# Patient Record
Sex: Male | Born: 1935 | Race: White | Hispanic: No | Marital: Married | Smoking: Former smoker
Health system: Southern US, Community
[De-identification: ages and names within clinical notes are randomized; demographics above are authoritative.]

## PROBLEM LIST (undated history)

## (undated) DIAGNOSIS — I2510 Atherosclerotic heart disease of native coronary artery without angina pectoris: Secondary | ICD-10-CM

## (undated) DIAGNOSIS — J449 Chronic obstructive pulmonary disease, unspecified: Secondary | ICD-10-CM

## (undated) DIAGNOSIS — I480 Paroxysmal atrial fibrillation: Secondary | ICD-10-CM

## (undated) DIAGNOSIS — K2210 Ulcer of esophagus without bleeding: Secondary | ICD-10-CM

## (undated) DIAGNOSIS — I10 Essential (primary) hypertension: Secondary | ICD-10-CM

## (undated) DIAGNOSIS — K5790 Diverticulosis of intestine, part unspecified, without perforation or abscess without bleeding: Secondary | ICD-10-CM

## (undated) DIAGNOSIS — K219 Gastro-esophageal reflux disease without esophagitis: Secondary | ICD-10-CM

## (undated) DIAGNOSIS — J45909 Unspecified asthma, uncomplicated: Secondary | ICD-10-CM

## (undated) DIAGNOSIS — K2270 Barrett's esophagus without dysplasia: Secondary | ICD-10-CM

## (undated) DIAGNOSIS — K648 Other hemorrhoids: Secondary | ICD-10-CM

## (undated) DIAGNOSIS — M1990 Unspecified osteoarthritis, unspecified site: Secondary | ICD-10-CM

## (undated) DIAGNOSIS — E785 Hyperlipidemia, unspecified: Secondary | ICD-10-CM

## (undated) DIAGNOSIS — S065X9A Traumatic subdural hemorrhage with loss of consciousness of unspecified duration, initial encounter: Secondary | ICD-10-CM

## (undated) DIAGNOSIS — I495 Sick sinus syndrome: Secondary | ICD-10-CM

## (undated) DIAGNOSIS — K449 Diaphragmatic hernia without obstruction or gangrene: Secondary | ICD-10-CM

## (undated) DIAGNOSIS — F0390 Unspecified dementia without behavioral disturbance: Secondary | ICD-10-CM

## (undated) HISTORY — DX: Atherosclerotic heart disease of native coronary artery without angina pectoris: I25.10

## (undated) HISTORY — DX: Diaphragmatic hernia without obstruction or gangrene: K44.9

## (undated) HISTORY — DX: Ulcer of esophagus without bleeding: K22.10

## (undated) HISTORY — DX: Paroxysmal atrial fibrillation: I48.0

## (undated) HISTORY — DX: Unspecified osteoarthritis, unspecified site: M19.90

## (undated) HISTORY — DX: Unspecified asthma, uncomplicated: J45.909

## (undated) HISTORY — PX: HEMORRHOID SURGERY: SHX153

## (undated) HISTORY — DX: Gastro-esophageal reflux disease without esophagitis: K21.9

## (undated) HISTORY — DX: Hyperlipidemia, unspecified: E78.5

## (undated) HISTORY — DX: Sick sinus syndrome: I49.5

## (undated) HISTORY — DX: Other hemorrhoids: K64.8

## (undated) HISTORY — DX: Diverticulosis of intestine, part unspecified, without perforation or abscess without bleeding: K57.90

## (undated) HISTORY — DX: Chronic obstructive pulmonary disease, unspecified: J44.9

## (undated) HISTORY — PX: CATARACT EXTRACTION: SUR2

## (undated) HISTORY — PX: VASECTOMY: SHX75

## (undated) HISTORY — PX: COLONOSCOPY: SHX174

## (undated) HISTORY — DX: Barrett's esophagus without dysplasia: K22.70

## (undated) HISTORY — PX: TONSILLECTOMY: SUR1361

## (undated) HISTORY — DX: Essential (primary) hypertension: I10

---

## 1999-11-26 ENCOUNTER — Encounter: Payer: Self-pay | Admitting: General Surgery

## 1999-11-26 ENCOUNTER — Inpatient Hospital Stay (HOSPITAL_COMMUNITY): Admission: EM | Admit: 1999-11-26 | Discharge: 1999-11-29 | Payer: Self-pay | Admitting: General Surgery

## 2000-03-26 ENCOUNTER — Encounter (INDEPENDENT_AMBULATORY_CARE_PROVIDER_SITE_OTHER): Payer: Self-pay | Admitting: Specialist

## 2000-03-26 ENCOUNTER — Ambulatory Visit (HOSPITAL_BASED_OUTPATIENT_CLINIC_OR_DEPARTMENT_OTHER): Admission: RE | Admit: 2000-03-26 | Discharge: 2000-03-26 | Payer: Self-pay | Admitting: General Surgery

## 2001-05-07 ENCOUNTER — Encounter (INDEPENDENT_AMBULATORY_CARE_PROVIDER_SITE_OTHER): Payer: Self-pay | Admitting: *Deleted

## 2001-05-07 ENCOUNTER — Encounter (INDEPENDENT_AMBULATORY_CARE_PROVIDER_SITE_OTHER): Payer: Self-pay | Admitting: Specialist

## 2001-05-07 ENCOUNTER — Ambulatory Visit (HOSPITAL_COMMUNITY): Admission: RE | Admit: 2001-05-07 | Discharge: 2001-05-07 | Payer: Self-pay | Admitting: *Deleted

## 2001-06-03 ENCOUNTER — Encounter (INDEPENDENT_AMBULATORY_CARE_PROVIDER_SITE_OTHER): Payer: Self-pay | Admitting: Specialist

## 2001-06-03 ENCOUNTER — Encounter (INDEPENDENT_AMBULATORY_CARE_PROVIDER_SITE_OTHER): Payer: Self-pay | Admitting: *Deleted

## 2001-06-03 ENCOUNTER — Ambulatory Visit (HOSPITAL_COMMUNITY): Admission: RE | Admit: 2001-06-03 | Discharge: 2001-06-03 | Payer: Self-pay | Admitting: General Surgery

## 2002-04-08 ENCOUNTER — Ambulatory Visit (HOSPITAL_COMMUNITY): Admission: RE | Admit: 2002-04-08 | Discharge: 2002-04-08 | Payer: Self-pay | Admitting: Internal Medicine

## 2002-04-08 ENCOUNTER — Encounter: Payer: Self-pay | Admitting: Internal Medicine

## 2002-06-16 ENCOUNTER — Ambulatory Visit (HOSPITAL_COMMUNITY): Admission: RE | Admit: 2002-06-16 | Discharge: 2002-06-16 | Payer: Self-pay | Admitting: *Deleted

## 2002-06-16 ENCOUNTER — Encounter (INDEPENDENT_AMBULATORY_CARE_PROVIDER_SITE_OTHER): Payer: Self-pay | Admitting: *Deleted

## 2002-06-16 ENCOUNTER — Encounter (INDEPENDENT_AMBULATORY_CARE_PROVIDER_SITE_OTHER): Payer: Self-pay | Admitting: Specialist

## 2004-04-02 ENCOUNTER — Emergency Department (HOSPITAL_COMMUNITY): Admission: EM | Admit: 2004-04-02 | Discharge: 2004-04-03 | Payer: Self-pay | Admitting: Emergency Medicine

## 2004-04-28 HISTORY — PX: CORONARY ANGIOPLASTY WITH STENT PLACEMENT: SHX49

## 2004-07-22 ENCOUNTER — Ambulatory Visit (HOSPITAL_COMMUNITY): Admission: RE | Admit: 2004-07-22 | Discharge: 2004-07-22 | Payer: Self-pay | Admitting: *Deleted

## 2004-07-22 ENCOUNTER — Encounter (INDEPENDENT_AMBULATORY_CARE_PROVIDER_SITE_OTHER): Payer: Self-pay | Admitting: *Deleted

## 2005-04-24 ENCOUNTER — Inpatient Hospital Stay (HOSPITAL_COMMUNITY): Admission: RE | Admit: 2005-04-24 | Discharge: 2005-04-25 | Payer: Self-pay | Admitting: *Deleted

## 2007-11-12 ENCOUNTER — Ambulatory Visit: Admission: RE | Admit: 2007-11-12 | Discharge: 2007-11-12 | Payer: Self-pay | Admitting: Internal Medicine

## 2008-06-25 ENCOUNTER — Emergency Department (HOSPITAL_COMMUNITY): Admission: EM | Admit: 2008-06-25 | Discharge: 2008-06-25 | Payer: Self-pay | Admitting: Emergency Medicine

## 2008-08-14 HISTORY — PX: NM MYOCAR PERF WALL MOTION: HXRAD629

## 2009-02-05 ENCOUNTER — Encounter: Admission: RE | Admit: 2009-02-05 | Discharge: 2009-02-05 | Payer: Self-pay | Admitting: Cardiology

## 2009-02-12 ENCOUNTER — Ambulatory Visit (HOSPITAL_COMMUNITY): Admission: RE | Admit: 2009-02-12 | Discharge: 2009-02-12 | Payer: Self-pay | Admitting: Cardiovascular Disease

## 2009-02-12 HISTORY — PX: CARDIAC CATHETERIZATION: SHX172

## 2009-03-05 ENCOUNTER — Ambulatory Visit: Payer: Self-pay | Admitting: Gastroenterology

## 2009-03-08 ENCOUNTER — Encounter: Payer: Self-pay | Admitting: Gastroenterology

## 2009-03-08 ENCOUNTER — Emergency Department (HOSPITAL_COMMUNITY): Admission: EM | Admit: 2009-03-08 | Discharge: 2009-03-08 | Payer: Self-pay | Admitting: Emergency Medicine

## 2009-03-19 ENCOUNTER — Encounter (INDEPENDENT_AMBULATORY_CARE_PROVIDER_SITE_OTHER): Payer: Self-pay

## 2009-03-19 ENCOUNTER — Ambulatory Visit: Payer: Self-pay | Admitting: Gastroenterology

## 2009-03-19 DIAGNOSIS — K2210 Ulcer of esophagus without bleeding: Secondary | ICD-10-CM

## 2009-03-19 DIAGNOSIS — K648 Other hemorrhoids: Secondary | ICD-10-CM

## 2009-03-19 DIAGNOSIS — K5790 Diverticulosis of intestine, part unspecified, without perforation or abscess without bleeding: Secondary | ICD-10-CM

## 2009-03-19 DIAGNOSIS — K449 Diaphragmatic hernia without obstruction or gangrene: Secondary | ICD-10-CM

## 2009-03-19 DIAGNOSIS — K2270 Barrett's esophagus without dysplasia: Secondary | ICD-10-CM

## 2009-03-19 HISTORY — DX: Other hemorrhoids: K64.8

## 2009-03-19 HISTORY — DX: Barrett's esophagus without dysplasia: K22.70

## 2009-03-19 HISTORY — DX: Diverticulosis of intestine, part unspecified, without perforation or abscess without bleeding: K57.90

## 2009-03-19 HISTORY — DX: Diaphragmatic hernia without obstruction or gangrene: K44.9

## 2009-03-19 HISTORY — DX: Ulcer of esophagus without bleeding: K22.10

## 2009-03-27 ENCOUNTER — Encounter: Payer: Self-pay | Admitting: Gastroenterology

## 2009-04-30 ENCOUNTER — Ambulatory Visit: Payer: Self-pay | Admitting: Gastroenterology

## 2009-04-30 DIAGNOSIS — K2270 Barrett's esophagus without dysplasia: Secondary | ICD-10-CM

## 2009-07-12 HISTORY — PX: US ECHOCARDIOGRAPHY: HXRAD669

## 2009-07-25 ENCOUNTER — Ambulatory Visit (HOSPITAL_COMMUNITY): Admission: RE | Admit: 2009-07-25 | Discharge: 2009-07-26 | Payer: Self-pay | Admitting: Cardiology

## 2009-07-25 HISTORY — PX: INSERT / REPLACE / REMOVE PACEMAKER: SUR710

## 2009-09-21 ENCOUNTER — Encounter: Admission: RE | Admit: 2009-09-21 | Discharge: 2009-09-21 | Payer: Self-pay | Admitting: Internal Medicine

## 2010-05-28 NOTE — Assessment & Plan Note (Signed)
Summary: follow up EGD/sheri   History of Present Illness Visit Type: Follow-up Visit Primary GI MD: Elie Goody MD [facility] Primary Provider: Juline Patch, MD Chief Complaint: follow up after EGD/Colon History of Present Illness:   Steve Norris returns for followup of GERD with Barrett's esophagus. His recent endoscopy showed a short segment of Barrett's esophagus without dysplasia noted. He has no gastrointestinal complaints.   GI Review of Systems      Denies abdominal pain, acid reflux, belching, bloating, chest pain, dysphagia with liquids, dysphagia with solids, heartburn, loss of appetite, nausea, vomiting, vomiting blood, weight loss, and  weight gain.        Denies anal fissure, black tarry stools, change in bowel habit, constipation, diarrhea, diverticulosis, fecal incontinence, heme positive stool, hemorrhoids, irritable bowel syndrome, jaundice, light color stool, liver problems, rectal bleeding, and  rectal pain. Preventive Screening-Counseling & Management      Drug Use:  no.     Current Medications (verified): 1)  Acetaminophen 500 Mg Caps (Acetaminophen) .... Take 1 Tablet By Mouth Once A Day 2)  Advair Diskus 100-50 Mcg/dose Aepb (Fluticasone-Salmeterol) .... Two Times A Day 3)  Proair Hfa 108 (90 Base) Mcg/act Aers (Albuterol Sulfate) .... As Needed 4)  Aspirin 81 Mg Tbec (Aspirin) .... Take 1 Tablet By Mouth Once A Day 5)  Crestor 10 Mg Tabs (Rosuvastatin Calcium) .... Take 1 Tab By Mouth At Bedtime 6)  Finasteride 5 Mg Tabs (Finasteride) .... Take 1 Tablet By Mouth Once A Day 7)  Fish Oil  Oil (Fish Oil) .... Take 2 Tablets Once Daily 8)  Indomethacin 50 Mg Caps (Indomethacin) .... Take 1 Tablet By Mouth As Needed 9)  Lisinopril 10 Mg Tabs (Lisinopril) .... Take 1 Tablet By Mouth Once A Day 10)  Magnesium Chloride  Crys (Magnesium Chloride) .... Take 1 Tablet By Mouth Once A Day 11)  Meloxicam 7.5 Mg Tabs (Meloxicam) .... Take 1 Tablet By Mouth As Needed 12)   Nitrostat 0.4 Mg Subl (Nitroglycerin) .... As Needed 13)  Protonix 40 Mg Tbec (Pantoprazole Sodium) .... Take 1 Tablet By Mouth Once A Day 14)  Temazepam 30 Mg Caps (Temazepam) .... Take 1 Tablet By Mouth As Needed 15)  B Complex  Tabs (B Complex Vitamins) .... Take 1 Tablet By Mouth Once A Day 16)  Vitamin C 500 Mg Tabs (Ascorbic Acid) .... Take 1 Tablet By Mouth Once A Day 17)  Stool Softener 100 Mg Caps (Docusate Sodium) .... Take 1 Capsule By Mouth Once A Day 18)  Metamucil Smooth Texture 63 % Powd (Psyllium) .Marland Kitchen.. 1 Scoop Daily 19)  Diltiazem Hcl 30 Mg Tabs (Diltiazem Hcl) .... Take 1 Tablet By Mouth As Needed  Allergies (verified): 1)  ! Penicillin  Past History:  Past Medical History: Reviewed history from 04/25/2009 and no changes required. Barretts Esophagus Diverticulosis Hemorrhoids Coronary Artery Disease Paroxysm atrial fibrillation  Hypertension  Past Surgical History: Right Coronary artery Cypher stents placed 2006 Vasectomy Hemorrhoidectomy Tonsillectomy  Family History: No FH of Colon Cancer: Family History of Ovarian Cancer: Sister  Social History: Married, 2 boys, 3 girls, (1 deceased as infant) Retired Patient is a former smoker. Quit 1996 Alcohol Use - no Daily Caffeine Use Illicit Drug Use - no Drug Use:  no  Review of Systems       The pertinent positives and negatives are noted as above and in the HPI. All other ROS were reviewed and were negative.   Vital Signs:  Patient profile:  77 year old male Height:      69.75 inches Weight:      210 pounds BMI:     30.46 Pulse rate:   52 / minute Pulse rhythm:   regular BP sitting:   110 / 66  (left arm) Cuff size:   regular  Vitals Entered By: Francee Piccolo CMA Duncan Dull) (April 30, 2009 11:34 AM)  Physical Exam  General:  Well developed, well nourished, no acute distress. Head:  Normocephalic and atraumatic. Mouth:  No deformity or lesions, dentition normal. Lungs:  Clear throughout  to auscultation. Heart:  Regular rate and rhythm; no murmurs, rubs,  or bruits. Abdomen:  Soft, nontender and nondistended. No masses, hepatosplenomegaly or hernias noted. Normal bowel sounds. Psych:  Alert and cooperative. Normal mood and affect.  Impression & Recommendations:  Problem # 1:  BARRETT'S ESOPHAGUS (ICD-530.85) Barrett's esophagus without dysplasia. Continue standard antireflux measures and Protonix 40mg  daily. Surveillance endoscopy in November 2013.  Problem # 2:  SCREENING COLORECTAL-CANCER (ICD-V76.51) Recent colonoscopy did not reveal polyps. He is a routine risk patient. Given his age there are no plans for future screening colonoscopies.  Patient Instructions: 1)  Avoid foods high in acid content ( tomatoes, citrus juices, spicy foods) . Avoid eating within 3 to 4 hours of lying down or before exercising. Do not over eat; try smaller more frequent meals. Elevate head of bed four inches when sleeping.  2)  Copy sent to : Juline Patch, MD

## 2010-07-19 LAB — GLUCOSE, CAPILLARY: Glucose-Capillary: 113 mg/dL — ABNORMAL HIGH (ref 70–99)

## 2010-07-31 LAB — DIFFERENTIAL
Basophils Relative: 1 % (ref 0–1)
Eosinophils Relative: 6 % — ABNORMAL HIGH (ref 0–5)
Lymphocytes Relative: 29 % (ref 12–46)
Lymphs Abs: 2.5 10*3/uL (ref 0.7–4.0)
Monocytes Relative: 12 % (ref 3–12)
Neutrophils Relative %: 53 % (ref 43–77)

## 2010-07-31 LAB — COMPREHENSIVE METABOLIC PANEL
ALT: 22 U/L (ref 0–53)
AST: 33 U/L (ref 0–37)
Chloride: 103 mEq/L (ref 96–112)
GFR calc Af Amer: >60 mL/min (ref >60)
GFR calc non Af Amer: >60 mL/min (ref >60)
Glucose, Bld: 98 mg/dL (ref 70–99)

## 2010-07-31 LAB — CBC
MCV: 97.6 fL (ref 78.0–100.0)
Platelets: 283 10*3/uL (ref 150–400)
RDW: 13.9 % (ref 11.5–15.5)

## 2010-07-31 LAB — CK TOTAL AND CKMB (NOT AT ARMC)
Relative Index: 3.6 — ABNORMAL HIGH (ref 0.0–2.5)
Total CK: 138 U/L (ref 7–232)

## 2010-07-31 LAB — MAGNESIUM: Magnesium: 2.2 mg/dL (ref 1.5–2.5)

## 2010-09-13 NOTE — Op Note (Signed)
[facility]  Patient:    Steve Norris, Steve Norris Visit Number: [id number] MRN: [id number]          Service Type: END Location: ENDO Attending Physician:  Sabino Gasser Dictated by:   Sabino Gasser, M.D. Proc. Date: 05/07/01 Admit Date:  05/07/2001   CC:         Lenon Curt. Cassell Clement, M.D.  Ollen Gross. Vernell Morgans, M.D.   Operative Report  ADDENDUM  Note:  Please send carbon copies of endoscopy and colonoscopy reports as noted. Dictated by:   Sabino Gasser, M.D. Attending Physician:  Sabino Gasser DD:  05/07/01 TD:  05/08/01 Job: [id number]

## 2010-09-13 NOTE — Procedures (Signed)
[facility]  Patient:    Steve Norris, Steve Norris Visit Number: [id number] MRN: [id number]          Service Type: END Location: ENDO Attending Physician:  Sabino Gasser Dictated by:   Sabino Gasser, M.D. Proc. Date: 05/07/01 Admit Date:  05/07/2001                             Procedure Report  PROCEDURE:  Colonoscopy.  INDICATION FOR PROCEDURE:  Rectal bleeding, colon polyp.  ANESTHESIA:  Demerol 10, Versed 1 mg.  DESCRIPTION OF PROCEDURE:  With the patient mildly sedated in the left lateral decubitus position, a rectal examination was performed which revealed prominent external hemorrhoids. Subsequently, the Olympus videoscopic colonoscope was inserted in the rectum and passed through a very rather tortuous sigmoid colon. We almost pulled back to an withdrew and inserted another colonoscope but we were able to advance this all the way to the cecum identified by the ileocecal valve and appendiceal orifice both of which were photographed. From this point, the colonoscope was slowly withdrawn taking circumferential views of the entire colonic mucosa, stopping in the rectum which showed a polyp on direct view which was removed using hot biopsy forceps technique on a setting of 20/20 blended current and appeared normal on retroflexed view. The endoscope was then straightened and withdrawn through the anal canal which showed the hemorrhoids that were felt, these were felt. The endoscope was withdrawn. The patients vital signs and pulse oximeter remained stable. The patient tolerated the procedure well without apparent complications.  FINDINGS:  Small rectal polyp removed by hot biopsy forceps technique. Await biopsy report. Tortuous sigmoid colon and external hemorrhoids.  PLAN:  Will have the patient call me for results of biopsy. See endoscopy note for further details and he will followup with me as an outpatient. Dictated by:   Sabino Gasser, M.D. Attending  Physician:  Sabino Gasser DD:  05/07/01 TD:  05/07/01 Job: [id number]

## 2010-09-13 NOTE — Consult Note (Signed)
[facility]  Patient:    Steve Norris, Steve Norris                        MRN: [id number] Proc. Date: 11/28/99 Adm. Date:  [id number] Attending:  Caleen Essex                          Consultation Report  CHIEF COMPLAINT:  Open wound, right leg.  HISTORY OF PRESENT ILLNESS:  This 77 year old man injured his right leg two weeks ago working on a bushhog and did not seek medical attention at that time.  Noticed increased swelling, pain and redness.  Saw a physician and he was started on Cipro and then underwent some sort of a drainage procedure.  It continued to worsen and he was referred to general surgery.  Dr. Evette Georges admitted him to the hospital and has had him on antibiotics and dressings with debridement.  Wound has improved.  REVIEW OF SYSTEMS:  Denies nausea, vomiting, fever, chills or chest pain. Review of systems is essentially unremarkable.  PAST MEDICAL HISTORY:  Positive for gastric ulcers and he takes Tagamet 300 mg t.i.d.  Otherwise, cardiac, renal and pulmonary are negative.  Denies hypertension or diabetes.  SOCIAL HISTORY:  Quit smoking four years ago.  Was a heavy smoker until that time.  He is retired and lives locally.  FAMILY HISTORY:  Noncontributory.  It is positive for heart disease and stomach ulcers in his father.  PHYSICAL EXAMINATION  EXTREMITIES:  He does have an open wound at the junction of the middle and lower third of his right leg.  Some minimal surrounding erythema.  There is some necrotic adipose tissue in the inferior aspect of the wound.  The periosteum appears to be intact.  No evidence of any abscess or purulence.  VASCULAR:  He does have 2 to 3+ posterior tibial pulses bilaterally.  IMPRESSION:  Open wound, right leg.  RECOMMENDATION:  Would continue saline dressings for another couple of weeks. Leg elevation as much as possible.  I will recheck him in the office in two weeks and then will make plans  regarding reconstruction at that point. DD:  11/28/99 TD:  11/29/99 Job: [id number]

## 2010-09-13 NOTE — Discharge Summary (Signed)
NAME:  Steve Norris, Steve Norris NO.:  [id number]   MEDICAL RECORD NO.:  [id number]          PATIENT TYPE:  INP   LOCATION:  6522                         FACILITY:  [facility]   PHYSICIAN:  Madaline Savage, M.D.DATE OF BIRTH:  20-Dec-1929   DATE OF ADMISSION:  04/24/2005  DATE OF DISCHARGE:  04/25/2005                                 DISCHARGE SUMMARY   DISCHARGE DIAGNOSES:  1.  Coronary disease:  Left anterior descending artery and right coronary      artery Cypher stenting this admission.  2.  Normal left ventricular function.  3.  History of syncope in September 2006.  4.  Paroxysm atrial fibrillation and sick sinus syndrome on outpatient      Holter monitor.  5.  Hypertension.   HOSPITAL COURSE:  The patient is a 77 year old who was referred to Dr.  Elsie Lincoln by Dr. Andi Devon in early December. He had a syncopal spell in  September. He has a few near-syncopal episodes since. Neurologic workup was  negative. He did have some PAF and pauses on an outpatient monitor. Dr.  Elsie Lincoln was concern he may need pacemaker. He wanted to rule out coronary  disease. The patient had an outpatient catheterization April 17, 2005  that showed RCA and LAD disease of 80-90% in the LAD and 75% in the RCA. LV  function was normal. He was admitted April 24, 2005 for elective  stenting. He had a Cypher stent to the proximal RCA and Cypher to the  proximal LAD with good results. We feel he could be discharged on April 25, 2005. He has had no further arrhythmias or near syncopal spells. The  patient is now on Plavix. We will get an outpatient Holter monitor. He will  follow up with Dr. Elsie Lincoln.   DISCHARGE MEDICATIONS:  1.  Plavix 75 milligrams a day.  2.  Coated aspirin daily.  3.  Prilosec OTC once a day.  4.  Avapro 150 milligrams a day.  5.  Lipitor 10 milligrams a day.  6.  Niacin 250 milligrams a day.  7.  Avodart 0.5 milligrams a day.  8.  Nitroglycerin sublingual p.r.n.   LABS:   Sodium 130, potassium 4.2, BUN 7, creatinine 0.7. White count 9.5,  hemoglobin 13.9, hematocrit 40.2, platelets 251,000. CK-MB and troponins  were negative. Chest x-ray shows COPD; no acute changes.   DISPOSITION:  The patient was discharged in stable condition and will follow-  up with Dr. Elsie Lincoln. EKG does show sinus rhythm with no acute changes.      Abelino Derrick, P.A.    ______________________________  Madaline Savage, M.D.    Lenard Lance  D:  04/25/2005  T:  04/25/2005  Job:  [id number]   cc:   Gabriel Earing, M.D.  Fax: [phone]

## 2010-09-13 NOTE — Op Note (Signed)
[place]  Patient:    Steve Norris, Steve Norris                        MRN: [id number] Proc. Date: 03/26/00 Adm. Date:  [id number] Disc. Date: [id number] Attending:  Chevis Pretty S                           Operative Report  PREOPERATIVE DIAGNOSIS:  Two masses on the back.  POSTOPERATIVE DIAGNOSIS:  Two masses on the back.  PROCEDURE:  Excision of two masses on the back.  SURGEON:  Chevis Pretty, M.D.  ANESTHESIA:  Local 1% lidocaine with epinephrine.  DESCRIPTION OF PROCEDURE:  After informed consent was obtained, the patient was brought to the operating room, placed in the prone position on the operating room table.  The patients back was prepped with Betadine and draped in the usual sterile manner.  After adequate induction of local anesthesia, each mass had an elliptical incision made over the top of each.  This incision was carried down through the skin into the subcutaneous tissue and using sharp dissection, each mass was excised from the rest of the subcutaneous tissue. Each of the specimens was sent to pathology for permanent evaluation.  Each of the wounds was evaluated and found to be hemostatic.  Each wound was closed with interrupted 4-0 Monocryl subcuticular stitches.  The patient tolerated the procedure well.  At the end of the case, all needle, sponge, and instrument counts were correct.  The patient was taken to the recovery room in stable condition. DD:  03/26/00 TD:  03/26/00 Job: [id number]

## 2010-09-13 NOTE — Procedures (Signed)
[facility]  Patient:    Steve Norris, Steve Norris Visit Number: [id number] MRN: [id number]          Service Type: END Location: ENDO Attending Physician:  Sabino Gasser Dictated by:   Sabino Gasser, M.D. Proc. Date: 05/07/01 Admit Date:  05/07/2001                             Procedure Report  PROCEDURE:  Upper endoscopy.  INDICATION FOR PROCEDURE:  Reflux.  ANESTHESIA:  Demerol 60, Versed 7 mg.  DESCRIPTION OF PROCEDURE:  With the patient mildly sedated in the left lateral decubitus position, the Olympus video endoscope was inserted in the mouth and passed under direct vision through the esophagus. The distal esophagus was approached and showed two possible areas of Barretts esophagus adjacent to each other that were photographed and biopsied. We entered into the stomach. The fundus, body, antrum, duodenal bulb, and second portion of the duodenum all appeared normal. From this point, the endoscope was slowly withdrawn taking circumferential views of the entire duodenal mucosa until the endoscope was then pulled back into the stomach, placed in retroflexion to view the stomach from below and a hiatal hernia was seen and photographed. The endoscope was then straightened and  withdrawn taking circumferential views of the remaining gastric and esophageal mucosa. The patients vital signs and pulse oximeter remained stable. The patient tolerated the procedure well and there were no apparent complications.  FINDINGS:  Changes of questionable Barretts esophagus above a hiatal hernia. Await biopsy report. The patient will call me for results and followup with me as an outpatient. Proceed to colonoscopy. Dictated by:   Sabino Gasser, M.D. Attending Physician:  Sabino Gasser DD:  05/07/01 TD:  05/07/01 Job: [id number]

## 2010-09-13 NOTE — Op Note (Signed)
[facility]  Patient:    Steve Norris, Steve Norris Visit Number: [id number] MRN: [id number]          Service Type: DSU Location: DAY Attending Physician:  Caleen Essex Dictated by:   Ollen Gross. Vernell Morgans, M.D. Proc. Date: 06/03/01 Admit Date:  06/03/2001                             Operative Report  PREOPERATIVE DIAGNOSIS:  Hemorrhoids.  POSTOPERATIVE DIAGNOSIS:  Hemorrhoids.  PROCEDURE:  Hemorrhoidectomy.  SURGEON:  Ollen Gross. Vernell Morgans, M.D.  ANESTHESIA:  General via LMA.  DESCRIPTION OF PROCEDURE:  After informed consent was obtained, the patient was brought to the operating room and placed in the supine position on the operating table.  After adequate induction of general anesthesia, the patient was placed in lithotomy position, and his perirectal area was prepped with Betadine and draped in the usual sterile manner.  The largest of his hemorrhoidal tissue was located anteriorly, and this area was infiltrated with 0.25% Marcaine with epinephrine.  The hemorrhoidal tissue was then grasped with an Allis clamp and excised sharply with a pair of Metzenbaum scissors. Care was taken to stay superficial to the sphincter muscles.  Once the hemorrhoidal tissue was excised, the incision was closed starting at the inside, and approximately three-quarters of the wound was closed running a 3-0 chromic suture towards the external area.  A small hemorrhoidal skin tag sitting just next to this was also excised sharply with the Metzenbaum scissors, and the defect was small enough that this was not closed with a stitch.  Pressure was then held on the area for several minutes and the wound reexamined.  The edges of the wound seemed to be very well approximated, and the wound was completely hemostatic.  There were no other masses or abnormalities seen.  He had some other smaller hemorrhoidal tissue that would not be addressed during this operation.  Lidocaine jelly and a Gelfoam  pad were then placed in the anal canal.  The patient tolerated the procedure well. At the end of the case, all sponge, needle, and instrument counts were correct.  The patient was awakened and taken to recovery room in stable condition. Dictated by:   Ollen Gross. Vernell Morgans, M.D. Attending Physician:  Caleen Essex DD:  06/04/01 TD:  06/04/01 Job: [id number]

## 2010-09-13 NOTE — Op Note (Signed)
   NAME:  Steve Norris, CUDA                           ACCOUNT NO.:  [id number]   MEDICAL RECORD NO.:  [id number]                   PATIENT TYPE:  AMB   LOCATION:  ENDO                                 FACILITY:  [facility]   PHYSICIAN:  Georgiana Spinner, M.D.                 DATE OF BIRTH:  [birth year]   DATE OF PROCEDURE:  DATE OF DISCHARGE:                                 OPERATIVE REPORT   PROCEDURE:  Upper endoscopy with biopsy.   INDICATIONS:  Gastroesophageal reflux disease, history of Barrett's.   ANESTHESIA:  Demerol 40, Versed 4 mg.   DESCRIPTION OF PROCEDURE:  The patient was mildly sedated in the left  lateral decubitus position.  The Olympus videoscopic endoscope inserted in  the mouth and passed under direct vision through the esophagus which  appeared normal.  I could not really see an area of Barrett's except for  possibly one short segment which was photographed and biopsied.  I entered  into the stomach through a hiatal hernia.  The fundus, body, antrum,  duodenal bulb, second portion of the duodenum all appeared normal.  From  this point, the endoscope was slowly withdrawn, taking circumferential views  of the duodenal mucosa until the endoscope was pulled back in the stomach,  placed in retroflexion to view the stomach from below.  The endoscope was  then straightened and withdrawn, taking circumferential views of the  remaining gastric and esophageal mucosa.  The patient's vital signs and  pulse oximetry remained stable. The patient tolerated the procedure well  without apparent complications.   FINDINGS:  1. Hiatal hernia.  2. Question of Barrett's.   PLAN:  Await biopsy report.  The patient will call me for results and follow  up with me as an outpatient.                                               Georgiana Spinner, M.D.    GMO/MEDQ  D:  06/16/2002  T:  06/16/2002  Job:  [id number]

## 2010-09-13 NOTE — Discharge Summary (Signed)
[facility]  Patient:    KRYSTLE, OBERMAN                        MRN: [id number] Adm. Date:  [id number] Disc. Date: [id number] Attending:  Caleen Essex                           Discharge Summary  HISTORY OF PRESENT ILLNESS AND HOSPITAL COURSE:  Mr. Bridgewater is a 77 year old who, a couple of weeks ago, was hit in the right leg with a bushhog and developed a hematoma that subsequently got infected.  He was seen in my office a couple of days ago, at which time he had significant cellulitis around his wound, a large hematoma that required evacuation.  Incision and drainage of this hematoma was performed in the office.  The patient was admitted to the hospital for IV antibiotics and wound care.  Since that time, his wound is beginning to clean up nicely, and his cellulitis is resolved with the antibiotics.  He has been over to oral antibiotics and has tolerated this well.  I asked plastic surgery to see him for their opinion on whether he would need any kind of soft tissue coverage because at the base of the wound there was some bone exposed, but they did not feel that this was warranted at this time.  We have arranged home health to do dressing changes twice a day for this leg wound, and we will continue him on oral clindamycin and Levaquin.  FOLLOW-UP:  We will have him follow up next week with [place] in the clinic for a wound check.  ACTIVITY:  His activity level will be as tolerated.  He is to elevate his right leg while in bed.  DIET:  Regular. DD:  11/29/99 TD:  12/01/99 Job: [id number]

## 2010-09-13 NOTE — Op Note (Signed)
NAME:  Steve Norris, Steve Norris NO.:  [id number]   MEDICAL RECORD NO.:  [id number]          PATIENT TYPE:  AMB   LOCATION:  ENDO                         FACILITY:  [facility]   PHYSICIAN:  Georgiana Spinner, M.D.    DATE OF BIRTH:  1930-01-31   DATE OF PROCEDURE:  07/22/2004  DATE OF DISCHARGE:                                 OPERATIVE REPORT   PROCEDURE:  Upper endoscopy with biopsy.   ENDOSCOPIST:  Georgiana Spinner, M.D.   INDICATIONS FOR PROCEDURE:  Gastroesophageal reflux disease.   ANESTHESIA:  Demerol 60 mg, Versed 6 mg.   DESCRIPTION OF PROCEDURE:  With the patient mildly sedated in the left  lateral decubitus position, the Olympus videoscopic endoscopic was inserted  in the mouth and passed under direct vision through the esophagus.  The  distal esophagus was approached and the squamocolumnar junction was  biopsied.  We entered into the stomach.  The fundus, body, antrum, duodenal  bulb, second portion of the duodenum appeared normal.  From this point the  endoscope was slowly withdrawn, taking circumferential views of the duodenal  mucosa until the endoscope had been pulled back into the stomach and placed  in retroflexion to view the stomach from below.  The endoscope was  straightened and withdrawn, taking circumferential views of the remaining  gastric and esophageal mucosa.   The patient's vital signs and pulse oximeter remained stable.  The patient  tolerated the procedure well without apparent complications.   FINDINGS:  Somewhat unremarkable examination.  Await the biopsy report.   PLAN:  The patient will call me for the results and follow up with me as an  outpatient.      GMO/MEDQ  D:  07/22/2004  T:  07/22/2004  Job:  [id number]

## 2010-09-13 NOTE — Cardiovascular Report (Signed)
NAME:  MART, COLPITTS NO.:  [id number]   MEDICAL RECORD NO.:  [id number]          PATIENT TYPE:  OIB   LOCATION:  2899                         FACILITY:  [facility]   PHYSICIAN:  Madaline Savage, M.D.DATE OF BIRTH:  1929/06/11   DATE OF PROCEDURE:  04/24/2005  DATE OF DISCHARGE:                              CARDIAC CATHETERIZATION   PROCEDURES PERFORMED:  1.  Coronary arteriography of the left coronary artery and of the right      coronary artery.  2.  Percutaneous coronary intervention of the mid-LAD.  3.  Percutaneous coronary intervention with stent placement of the proximal      RCA.   COMPLICATIONS:  None.   Entry site right femoral. Dye used Omnipaque. Medications given:  Intracoronary nitroglycerin, intravenous Angiomax. The patient had been  preloaded with Plavix on an outpatient basis.   PATIENT PROFILE:  The patient is a 77 year old gentleman with recent  abnormal Cardiolite stress test and a subsequent cardiac catheterization at  the [facility] showing mid-LAD stenosis and proximal RCA  stenosis. He was loaded with Plavix and also given aspirin on an outpatient  basis about 10 days ago and now presents for outpatient percutaneous  intervention. The patient was given informed consent about the procedure and  we had a long discussion about drug-eluting versus non drug-eluting stents  and the use of Plavix afterward. The patient understood and chose to have  drug-eluting stents.   We proceeded with right percutaneous femoral angiography and then subsequent  stenting via right femoral artery approach using 6-French introducer sheath  and 6-French guide catheters. The guide catheter on the left was an XB LAD  by Cordis. The guidewire was a Research scientist (physical sciences). The wire easily crossed the  lesion as did the stent balloon and a 3.0 x 18 mm Cypher stent was deployed  and after deployment with the stent balloon was post dilated with a 3.25 x  15 mm  Quantum Maverick balloon taken to 18 atmospheres of pressure for  inflation.   The LAD looked 80% stenosed prior to the intervention and 0% residually  stenosed after stent placement. We preserved TIMI III flow pre and post.   The right coronary artery was a large and dominant vessel that contained an  80% stenosis just beyond the pulmonary conus branch. We direct stented that  using the same Prowater wire and the guide catheter was a [employer] scientific  right Judkins 4 without side holes. The stent chosen was a 3.0 x 13 Cypher.  I post dilated with a 3.25 12 Quantum Maverick and of 3.5 Quantum Maverick  and it turned out that the vessel was actually 3.5 after post dilatation.  TIMI III flow was preserved. The lesion of 80% eccentric stenosis was  reduced to 0% residual.   FINAL DIAGNOSIS:  1.  Two-vessel percutaneous intervention completed.      1.  LAD mid reduced from 80% to zero with a Cypher stent 3.0 x 13.      2.  Successful stenting of proximal RCA with a 3.0 x 13 mm Cypher  stent          post dilated to 3.5 mm in diameter.           ______________________________  Madaline Savage, M.D.     WHG/MEDQ  D:  04/24/2005  T:  04/24/2005  Job:  [id number]   cc:   Gabriel Earing, M.D.  Fax: [phone]

## 2010-10-16 ENCOUNTER — Ambulatory Visit (HOSPITAL_COMMUNITY)
Admission: RE | Admit: 2010-10-16 | Discharge: 2010-10-16 | Disposition: A | Discharge status: Home / Self Care | Payer: PRIVATE HEALTH INSURANCE | Source: Ambulatory Visit | Attending: Internal Medicine | Admitting: Internal Medicine

## 2010-10-16 DIAGNOSIS — J988 Other specified respiratory disorders: Secondary | ICD-10-CM | POA: Insufficient documentation

## 2010-10-16 DIAGNOSIS — Z79899 Other long term (current) drug therapy: Secondary | ICD-10-CM | POA: Insufficient documentation

## 2010-10-16 DIAGNOSIS — R0609 Other forms of dyspnea: Secondary | ICD-10-CM | POA: Insufficient documentation

## 2010-10-16 DIAGNOSIS — R0989 Other specified symptoms and signs involving the circulatory and respiratory systems: Secondary | ICD-10-CM | POA: Insufficient documentation

## 2010-10-16 DIAGNOSIS — R0602 Shortness of breath: Secondary | ICD-10-CM | POA: Insufficient documentation

## 2012-02-09 ENCOUNTER — Encounter: Payer: Self-pay | Admitting: Gastroenterology

## 2012-02-17 ENCOUNTER — Encounter: Payer: Self-pay | Admitting: Physician Assistant

## 2012-02-20 ENCOUNTER — Telehealth: Payer: Self-pay | Admitting: Gastroenterology

## 2012-02-20 NOTE — Telephone Encounter (Signed)
Left message for patient to call back  

## 2012-02-20 NOTE — Telephone Encounter (Signed)
Patient does not see the need for an office visit prior to his recall EGD.  I have explained to the patient that all patients on anti coagulants are seen in the office prior to an endoscopic procedure.  He will keep his appt for Monday

## 2012-02-23 ENCOUNTER — Ambulatory Visit (INDEPENDENT_AMBULATORY_CARE_PROVIDER_SITE_OTHER): Payer: PRIVATE HEALTH INSURANCE | Admitting: Physician Assistant

## 2012-02-23 ENCOUNTER — Encounter: Payer: Self-pay | Admitting: Physician Assistant

## 2012-02-23 VITALS — BP 132/60 | HR 60 | Ht 69.75 in | Wt 203.0 lb

## 2012-02-23 DIAGNOSIS — Z950 Presence of cardiac pacemaker: Secondary | ICD-10-CM

## 2012-02-23 DIAGNOSIS — K2270 Barrett's esophagus without dysplasia: Secondary | ICD-10-CM

## 2012-02-23 DIAGNOSIS — I2510 Atherosclerotic heart disease of native coronary artery without angina pectoris: Secondary | ICD-10-CM | POA: Insufficient documentation

## 2012-02-23 DIAGNOSIS — J4489 Other specified chronic obstructive pulmonary disease: Secondary | ICD-10-CM

## 2012-02-23 DIAGNOSIS — J449 Chronic obstructive pulmonary disease, unspecified: Secondary | ICD-10-CM

## 2012-02-23 DIAGNOSIS — I4891 Unspecified atrial fibrillation: Secondary | ICD-10-CM

## 2012-02-23 NOTE — Progress Notes (Signed)
Subjective:    Patient ID: Steve Norris, male    DOB: 22-Nov-1929, 78 y.o.   MRN: [id number]  HPI Steve Norris is a very nice 78 year old white male known to Dr. Russella Dar with history of Barrett's esophagus. His last endoscopy was done in 2010 and he was found to have distal esophagitis and an irregular Z line. Biopsies showed intestinal metaplasia consistent with Barrett's but no dysplasia. Reviewing his prior path results from endoscopies with Dr. Virginia Rochester, I do not see that he has ever had any dysplasia He says he is doing well, did switch to Nexium as Protonix was not working well for his reflux symptoms and says that Nexium is working much better. He has no complaints of dysphagia or odynophagia. He says occasionally he will have some choking particularly when he eats beef but generally feels that this affects his airway and not his esophagus. He does have problems with chronic constipation, no recent changes and uses milk of magnesia and prune juice as needed. He is on chronic Coumadin at this time over the past 2 years after diagnosis of sick sinus syndrome and atrial fibrillation for which he underwent pacemaker. He is followed by Dr. Lanney Gins at [facility] cardiology. He has not had any prior TIA or stroke.    Review of Systems  Constitutional: Negative.   HENT: Negative.   Eyes: Negative.   Respiratory: Negative.   Cardiovascular: Negative.   Gastrointestinal: Positive for constipation.  Genitourinary: Negative.   Musculoskeletal: Negative.   Skin: Negative.   Neurological: Negative.   Hematological: Negative.   Psychiatric/Behavioral: Negative.    Outpatient Encounter Prescriptions as of 02/23/2012  Medication Sig Dispense Refill  . aspirin 81 MG tablet Take 81 mg by mouth daily.      Marland Kitchen b complex vitamins capsule Take 1 capsule by mouth daily.      Marland Kitchen diltiazem (CARDIZEM CD) 120 MG 24 hr capsule Takes one capsule by mouth once daily      . Docusate Calcium (STOOL SOFTENER PO) Take by mouth  as directed.      Marland Kitchen HYDROcodone-acetaminophen (NORCO/VICODIN) 5-325 MG per tablet Take 1 tablet by mouth as needed.      . indomethacin (INDOCIN) 50 MG capsule Take 50 mg by mouth as needed.      Marland Kitchen lisinopril (PRINIVIL,ZESTRIL) 10 MG tablet One capsule by mouth once daily      . Multiple Vitamin (MULTIVITAMIN) capsule Take 1 capsule by mouth daily.      Marland Kitchen NEXIUM 40 MG capsule One capsule by mouth once daily      . nitroGLYCERIN (NITROSTAT) 0.4 MG SL tablet Place 0.4 mg under the tongue as needed.      . Omega-3 Fatty Acids (FISH OIL) 1000 MG CAPS Take 2 capsules by mouth daily.      . Psyllium (METAMUCIL SMOOTH TEXTURE PO) Take by mouth as directed.      . rosuvastatin (CRESTOR) 5 MG tablet Take 5 mg by mouth as directed.      . temazepam (RESTORIL) 30 MG capsule Take 15 mg by mouth at bedtime as needed.       . vitamin C (ASCORBIC ACID) 500 MG tablet Take 1,000 mg by mouth daily.       Marland Kitchen warfarin (COUMADIN) 5 MG tablet Takes as directed      . atenolol (TENORMIN) 25 MG tablet Takes one tablet by mouth once daily      . finasteride (PROSCAR) 5 MG tablet Takes one tablet by mouth  once daily      . DISCONTD: CRESTOR 20 MG tablet Takes as directed      . DISCONTD: diltiazem (CARDIZEM) 30 MG tablet Take 30 mg by mouth as directed.      Marland Kitchen DISCONTD: pantoprazole (PROTONIX) 40 MG tablet Take 40 mg by mouth daily.       Allergies  Allergen Reactions  . Penicillins     REACTION: rash/hives/swelling       Patient Active Problem List  Diagnosis  . BARRETT'S ESOPHAGUS  . COPD (chronic obstructive pulmonary disease)  . CAD (coronary artery disease)  . S/P placement of cardiac pacemaker  . Atrial fibrillation   History  Substance Use Topics  . Smoking status: Former Games developer  . Smokeless tobacco: Never Used   Comment: Quit smoking 1996  . Alcohol Use: No   Objective:   Physical Exam well-developed elderly white male in no acute distress, pleasant blood pressure 132/60 pulse 60 height 5 foot  9 weight 203. HEENT; nontraumatic normocephalic EOMI PERRLA sclera anicteric, Neck;Supple no JVD, Cardiovascular; regular rate and rhythm with S1-S2 no murmur rub or gallop, Pulmonary; clear bilaterally, Abdomen; soft nontender nondistended bowel sounds are active there is no palpable mass or hepatosplenomegaly, Rectal; not done, Extremities; no clubbing cyanosis or edema skin warm and dry, Psych; mood and affect normal and appropriate        Assessment & Plan:  #72 78 year old male with history of Barrett's esophagus due for followup EGD. No history of dysplasia.  #2 chronic anti-coagulation with Coumadin #3 coronary artery disease status post stent to LAD and RCA 2006 #4 status post pacemaker placement 2011 #5 history of atrial fibrillation and sick sinus syndrome #6 COPD  Plan; schedule for upper endoscopy with surveillance biopsies with Dr. Russella Dar We will obtain consent from his cardiologist to stop Coumadin 5 days prior to his procedure Continue Nexium 40 mg by mouth daily

## 2012-02-23 NOTE — Progress Notes (Signed)
Reviewed and agree with management plans. If no dysplasia is noted this will probably be the last Barrett's surveillance EGD.

## 2012-02-23 NOTE — Patient Instructions (Addendum)
You have been scheduled for an endoscopy with propofol. Please follow written instructions given to you at your visit today. If you use inhalers (even only as needed) or a CPAP machine, please bring them with you on the day of your procedure. 

## 2012-02-27 ENCOUNTER — Telehealth: Payer: Self-pay | Admitting: *Deleted

## 2012-02-27 NOTE — Telephone Encounter (Signed)
Received letter back from Dr. Royann Shivers and he advised it is ok for patient to come off of his coumadin 5 days prior to procedure.  I called patient to advise him that it is ok to hold his coumadin for 5 days before procedure per Dr. Royann Shivers. Patient verbalized understanding.

## 2012-03-30 ENCOUNTER — Encounter: Payer: PRIVATE HEALTH INSURANCE | Admitting: Gastroenterology

## 2012-04-05 ENCOUNTER — Encounter: Payer: Self-pay | Admitting: Gastroenterology

## 2012-04-05 ENCOUNTER — Ambulatory Visit (AMBULATORY_SURGERY_CENTER): Payer: PRIVATE HEALTH INSURANCE | Admitting: Gastroenterology

## 2012-04-05 VITALS — BP 128/76 | HR 60 | Temp 97.8°F | Resp 17 | Ht 70.0 in | Wt 203.0 lb

## 2012-04-05 DIAGNOSIS — K2270 Barrett's esophagus without dysplasia: Secondary | ICD-10-CM

## 2012-04-05 DIAGNOSIS — K219 Gastro-esophageal reflux disease without esophagitis: Secondary | ICD-10-CM

## 2012-04-05 DIAGNOSIS — K229 Disease of esophagus, unspecified: Secondary | ICD-10-CM

## 2012-04-05 MED ORDER — SODIUM CHLORIDE 0.9 % IV SOLN: 500.0000 mL | INTRAVENOUS | Status: DC

## 2012-04-05 NOTE — Progress Notes (Signed)
Called to room to assist during endoscopic procedure.  Patient ID and intended procedure confirmed with present staff. Received instructions for my participation in the procedure from the performing physician.  

## 2012-04-05 NOTE — Op Note (Signed)
[facility] [address] [place], [address]   ENDOSCOPY PROCEDURE REPORT  PATIENT: Steve Norris, Steve Norris  MR#: [id number] BIRTHDATE: 07/25/1929 , 82  yrs. old GENDER: Male ENDOSCOPIST: Meryl Dare, MD, [facility] PROCEDURE DATE:  04/05/2012 PROCEDURE:  EGD w/ biopsy ASA CLASS:     Class III INDICATIONS:  follow up of Barrett's esophagus. MEDICATIONS: MAC sedation, administered by CRNA and propofol (Diprivan) 100mg  IV TOPICAL ANESTHETIC: Cetacaine Spray DESCRIPTION OF PROCEDURE: After the risks benefits and alternatives of the procedure were thoroughly explained, informed consent was obtained.  The LB GIF-H180 [id number] endoscope was introduced through the mouth and advanced to the second portion of the duodenum. Without limitations.  The instrument was slowly withdrawn as the mucosa was fully examined.  ESOPHAGUS: An irregular Z-line was observed.  Multiple biopsies were performed using cold forceps.   The esophagus was otherwise normal.  STOMACH: The mucosa and folds of the stomach appeared normal. DUODENUM: The duodenal mucosa showed no abnormalities in the bulb and second portion of the duodenum.  Retroflexed views revealed a 3 cm hiatal hernia.     The scope was then withdrawn from the patient and the procedure completed. COMPLICATIONS: There were no complications.  ENDOSCOPIC IMPRESSION: 1.   Irregular Z-line was observed; multiple biopsies 2.   Small hiatal hernia  RECOMMENDATIONS: 1.  Anti-reflux regimen 2.  Await pathology results 3.  Continue PPI long term 4.  No plans for repeat surveillance EGDs given age and comorbidities if no dysplasia noted 5.  Resume Coumadin today and recheck PT/INR in 1 week   eSigned:  Meryl Dare, MD, [facility] 04/05/2012 1:55 PM   [id number] Ricki Miller, MD

## 2012-04-05 NOTE — Progress Notes (Signed)
Patient did not have preoperative order for IV antibiotic SSI prophylaxis. (G8918)   

## 2012-04-05 NOTE — Progress Notes (Signed)
Patient did not have preoperative order for IV antibiotic SSI prophylaxis. (G8918)  Patient did not experience any of the following events: a burn prior to discharge; a fall within the facility; wrong site/side/patient/procedure/implant event; or a hospital transfer or hospital admission upon discharge from the facility. (G8907)  

## 2012-04-05 NOTE — Patient Instructions (Addendum)
YOU HAD AN ENDOSCOPIC PROCEDURE TODAY AT THE [facility]: Refer to the procedure report that was given to you for any specific questions about what was found during the examination.  If the procedure report does not answer your questions, please call your gastroenterologist to clarify.  If you requested that your care partner not be given the details of your procedure findings, then the procedure report has been included in a sealed envelope for you to review at your convenience later.  YOU SHOULD EXPECT: Some feelings of bloating in the abdomen. Passage of more gas than usual.  Walking can help get rid of the air that was put into your GI tract during the procedure and reduce the bloating. If you had a lower endoscopy (such as a colonoscopy or flexible sigmoidoscopy) you may notice spotting of blood in your stool or on the toilet paper. If you underwent a bowel prep for your procedure, then you may not have a normal bowel movement for a few days.  DIET: Your first meal following the procedure should be a light meal and then it is ok to progress to your normal diet.  A half-sandwich or bowl of soup is an example of a good first meal.  Heavy or fried foods are harder to digest and may make you feel nauseous or bloated.  Likewise meals heavy in dairy and vegetables can cause extra gas to form and this can also increase the bloating.  Drink plenty of fluids but you should avoid alcoholic beverages for 24 hours.  ACTIVITY: Your care partner should take you home directly after the procedure.  You should plan to take it easy, moving slowly for the rest of the day.  You can resume normal activity the day after the procedure however you should NOT DRIVE or use heavy machinery for 24 hours (because of the sedation medicines used during the test).    SYMPTOMS TO REPORT IMMEDIATELY: A gastroenterologist can be reached at any hour.  During normal business hours, 8:30 AM to 5:00 PM Monday through Friday,  call [phone].  After hours and on weekends, please call the GI answering service at [phone] who will take a message and have the physician on call contact you.   Following upper endoscopy (EGD)  Vomiting of blood or coffee ground material  New chest pain or pain under the shoulder blades  Painful or persistently difficult swallowing  New shortness of breath  Fever of 100F or higher  Black, tarry-looking stools  FOLLOW UP: If any biopsies were taken you will be contacted by phone or by letter within the next 1-3 weeks.  Call your gastroenterologist if you have not heard about the biopsies in 3 weeks.  Our staff will call the home number listed on your records the next business day following your procedure to check on you and address any questions or concerns that you may have at that time regarding the information given to you following your procedure. This is a courtesy call and so if there is no answer at the home number and we have not heard from you through the emergency physician on call, we will assume that you have returned to your regular daily activities without incident.  SIGNATURES/CONFIDENTIALITY: You and/or your care partner have signed paperwork which will be entered into your electronic medical record.  These signatures attest to the fact that that the information above on your After Visit Summary has been reviewed and is understood.  Full responsibility   of the confidentiality of this discharge information lies with you and/or your care-partner.  Thank-you for choosing us for your healthcare needs. 

## 2012-04-06 ENCOUNTER — Telehealth: Payer: Self-pay | Admitting: *Deleted

## 2012-04-06 NOTE — Telephone Encounter (Signed)
  Follow up Call-  Call back number 04/05/2012  Post procedure Call Back phone  # [phone]  Permission to leave phone message Yes     Patient questions:  Do you have a fever, pain , or abdominal swelling? no Pain Score  0 *  Have you tolerated food without any problems? yes  Have you been able to return to your normal activities? yes  Do you have any questions about your discharge instructions: Diet   no Medications  yes Follow up visit  no  Do you have questions or concerns about your Care? no  Actions: * If pain score is 4 or above: No action needed, pain <4.  Pt. Wanted to know if PPI was medication.  He was advised that this is Nexium and that Dr. Russella Dar advised continuing. Pt. Verbalized understanding.

## 2012-04-11 ENCOUNTER — Encounter: Payer: Self-pay | Admitting: Gastroenterology

## 2012-04-27 ENCOUNTER — Encounter: Payer: PRIVATE HEALTH INSURANCE | Admitting: Gastroenterology

## 2012-07-14 ENCOUNTER — Ambulatory Visit: Payer: Self-pay | Admitting: Cardiovascular Disease

## 2012-07-14 DIAGNOSIS — Z7901 Long term (current) use of anticoagulants: Secondary | ICD-10-CM

## 2012-07-14 DIAGNOSIS — I4891 Unspecified atrial fibrillation: Secondary | ICD-10-CM

## 2012-07-17 LAB — PACEMAKER DEVICE OBSERVATION

## 2012-09-15 ENCOUNTER — Ambulatory Visit: Payer: PRIVATE HEALTH INSURANCE | Admitting: Pharmacist Clinician (PhC)/ Clinical Pharmacy Specialist

## 2012-09-16 ENCOUNTER — Other Ambulatory Visit: Payer: Self-pay

## 2012-09-16 MED ORDER — DILTIAZEM HCL ER COATED BEADS 120 MG PO CP24: 120.0000 mg | ORAL_CAPSULE | Freq: Every day | ORAL | Status: DC

## 2012-09-16 NOTE — Telephone Encounter (Signed)
Rx was sent to pharmacy electronically. 

## 2012-09-17 ENCOUNTER — Ambulatory Visit (INDEPENDENT_AMBULATORY_CARE_PROVIDER_SITE_OTHER): Payer: PRIVATE HEALTH INSURANCE | Admitting: Pharmacist Clinician (PhC)/ Clinical Pharmacy Specialist

## 2012-09-17 VITALS — BP 126/78 | HR 72

## 2012-09-17 DIAGNOSIS — Z7901 Long term (current) use of anticoagulants: Secondary | ICD-10-CM

## 2012-09-17 DIAGNOSIS — I4891 Unspecified atrial fibrillation: Secondary | ICD-10-CM

## 2012-09-17 MED ORDER — DILTIAZEM HCL ER COATED BEADS 120 MG PO CP24: 120.0000 mg | ORAL_CAPSULE | Freq: Every day | ORAL | Status: DC

## 2012-10-14 ENCOUNTER — Telehealth: Payer: Self-pay | Admitting: Cardiovascular Disease

## 2012-10-14 NOTE — Telephone Encounter (Signed)
Paper chart3 [id number] on Dr. Erin Hearing cart.

## 2012-10-14 NOTE — Telephone Encounter (Signed)
No special precautions are required for the pacemaker. If his dentist thinks it is necessry, he can stop warfarin for 4 days

## 2012-10-14 NOTE — Telephone Encounter (Signed)
Going to have dental work-he has a pacemaker and is on Coumadin-What does he need to do?

## 2012-10-14 NOTE — Telephone Encounter (Signed)
Patient notified no special precautions w/pacemaker prior to dental procedure.  If the dentist needs him to hold warfarin for procedure he can do so for 4 days and restart ASAP.  Pt voiced understanding. 

## 2012-10-14 NOTE — Telephone Encounter (Signed)
Returned call.  Pt stated he thinks he has an abscessed tooth and is going to get it checked out tomorrow.  Pt wants to know what to do about his coumadin and also stated he has a pacemaker.  Pt informed Dr. Royann Shivers will be notified for further instructions.  Pt verbalized understanding and agreed w/ plan.  Pt has not been scheduled for a procedure at this time.

## 2012-10-14 NOTE — Telephone Encounter (Signed)
Patient notified no special precautions w/pacemaker prior to dental procedure.  If the dentist needs him to hold warfarin for procedure he can do so for 4 days and restart ASAP.  Pt voiced understanding.

## 2012-10-18 ENCOUNTER — Telehealth: Payer: Self-pay | Admitting: Cardiovascular Disease

## 2012-10-18 NOTE — Telephone Encounter (Signed)
No, neither the stents , nor the pacemaker need antibiotic treatment with any dental procedure.

## 2012-10-18 NOTE — Telephone Encounter (Signed)
Steve Norris is wanting to know if he should take an antibiotic or anything that is needed for him to before the root canal, because he has a pacemaker , two stents,takes warfarin..    Thanks

## 2012-10-18 NOTE — Telephone Encounter (Signed)
Message forwarded to Dr. Royann Shivers.  Paper chart# [id number] on cart.

## 2012-10-19 ENCOUNTER — Telehealth: Payer: Self-pay | Admitting: Cardiovascular Disease

## 2012-10-19 NOTE — Telephone Encounter (Signed)
Returned call and informed pt per instructions by MD/PA.  Pt verbalized understanding and agreed w/ plan.  

## 2012-10-19 NOTE — Telephone Encounter (Signed)
Will have Dr. Tresa Endo review

## 2012-10-19 NOTE — Telephone Encounter (Signed)
Need a note stating that he can be off of Coumadin for 4 days!Please fax to [facility]-Fax# [phone]

## 2012-10-23 ENCOUNTER — Other Ambulatory Visit: Payer: Self-pay | Admitting: Cardiovascular Disease

## 2012-10-23 DIAGNOSIS — I495 Sick sinus syndrome: Secondary | ICD-10-CM

## 2012-10-23 LAB — PACEMAKER DEVICE OBSERVATION

## 2012-10-25 ENCOUNTER — Ambulatory Visit (INDEPENDENT_AMBULATORY_CARE_PROVIDER_SITE_OTHER): Payer: PRIVATE HEALTH INSURANCE | Admitting: Pharmacist Clinician (PhC)/ Clinical Pharmacy Specialist

## 2012-10-25 VITALS — BP 134/68 | HR 88

## 2012-10-25 DIAGNOSIS — I4891 Unspecified atrial fibrillation: Secondary | ICD-10-CM

## 2012-10-25 DIAGNOSIS — Z7901 Long term (current) use of anticoagulants: Secondary | ICD-10-CM

## 2012-10-27 ENCOUNTER — Encounter: Payer: Self-pay | Admitting: *Deleted

## 2012-10-27 LAB — REMOTE PACEMAKER DEVICE
ATRIAL PACING PM: 97
BAMS-0003: 70 {beats}/min
VENTRICULAR PACING PM: 1

## 2012-11-02 ENCOUNTER — Ambulatory Visit: Payer: PRIVATE HEALTH INSURANCE | Admitting: Pharmacist Clinician (PhC)/ Clinical Pharmacy Specialist

## 2012-11-09 ENCOUNTER — Ambulatory Visit (INDEPENDENT_AMBULATORY_CARE_PROVIDER_SITE_OTHER): Payer: PRIVATE HEALTH INSURANCE | Admitting: Pharmacist Clinician (PhC)/ Clinical Pharmacy Specialist

## 2012-11-09 ENCOUNTER — Other Ambulatory Visit: Payer: Self-pay | Admitting: Dermatology

## 2012-11-09 VITALS — BP 108/60 | HR 64

## 2012-11-09 DIAGNOSIS — I4891 Unspecified atrial fibrillation: Secondary | ICD-10-CM

## 2012-11-09 DIAGNOSIS — Z7901 Long term (current) use of anticoagulants: Secondary | ICD-10-CM

## 2012-11-09 LAB — POCT INR: INR: 3.5

## 2012-11-10 ENCOUNTER — Telehealth: Payer: Self-pay | Admitting: *Deleted

## 2012-11-10 MED ORDER — WARFARIN SODIUM 5 MG PO TABS: ORAL_TABLET | ORAL | Status: DC

## 2012-11-10 NOTE — Telephone Encounter (Signed)
Returned call from [place] at [facility] r/t refill.  Stated they have faxed multiple requests w/o a response and pt is upset.  Informed Eryn that no record of refill request received.  Stated pt needs refill on warfarin 5mg .  Informed refill will be processed today.  Message forwarded to K. Alvstad, PharmD for warfarin refill.

## 2012-11-11 ENCOUNTER — Other Ambulatory Visit: Payer: Self-pay | Admitting: Pharmacist Clinician (PhC)/ Clinical Pharmacy Specialist

## 2012-11-11 MED ORDER — WARFARIN SODIUM 5 MG PO TABS: ORAL_TABLET | ORAL | Status: DC

## 2012-11-20 ENCOUNTER — Encounter: Payer: Self-pay | Admitting: Cardiovascular Disease

## 2012-11-23 ENCOUNTER — Ambulatory Visit: Payer: PRIVATE HEALTH INSURANCE | Admitting: Pharmacist Clinician (PhC)/ Clinical Pharmacy Specialist

## 2012-11-25 ENCOUNTER — Ambulatory Visit (INDEPENDENT_AMBULATORY_CARE_PROVIDER_SITE_OTHER): Payer: PRIVATE HEALTH INSURANCE | Admitting: Pharmacist Clinician (PhC)/ Clinical Pharmacy Specialist

## 2012-11-25 DIAGNOSIS — Z7901 Long term (current) use of anticoagulants: Secondary | ICD-10-CM

## 2012-11-25 DIAGNOSIS — I4891 Unspecified atrial fibrillation: Secondary | ICD-10-CM

## 2012-12-15 ENCOUNTER — Encounter: Payer: Self-pay | Admitting: Cardiovascular Disease

## 2012-12-15 ENCOUNTER — Ambulatory Visit (INDEPENDENT_AMBULATORY_CARE_PROVIDER_SITE_OTHER): Payer: PRIVATE HEALTH INSURANCE | Admitting: Cardiovascular Disease

## 2012-12-15 ENCOUNTER — Ambulatory Visit (INDEPENDENT_AMBULATORY_CARE_PROVIDER_SITE_OTHER): Payer: PRIVATE HEALTH INSURANCE | Admitting: Pharmacist Clinician (PhC)/ Clinical Pharmacy Specialist

## 2012-12-15 VITALS — BP 126/72 | HR 60 | Resp 20 | Ht 69.5 in | Wt 198.7 lb

## 2012-12-15 DIAGNOSIS — I4891 Unspecified atrial fibrillation: Secondary | ICD-10-CM

## 2012-12-15 DIAGNOSIS — Z7901 Long term (current) use of anticoagulants: Secondary | ICD-10-CM

## 2012-12-15 DIAGNOSIS — R0609 Other forms of dyspnea: Secondary | ICD-10-CM

## 2012-12-15 DIAGNOSIS — Z950 Presence of cardiac pacemaker: Secondary | ICD-10-CM

## 2012-12-15 DIAGNOSIS — I2510 Atherosclerotic heart disease of native coronary artery without angina pectoris: Secondary | ICD-10-CM

## 2012-12-15 DIAGNOSIS — R0600 Dyspnea, unspecified: Secondary | ICD-10-CM | POA: Insufficient documentation

## 2012-12-15 LAB — PACEMAKER DEVICE OBSERVATION
ATRIAL PACING PM: 97
BAMS-0001: 150 {beats}/min
BAMS-0003: 70 {beats}/min
BATTERY VOLTAGE: 2.96 V
DEVICE MODEL PM: 7124006
RV LEAD THRESHOLD: 0.75 V
VENTRICULAR PACING PM: 1.1

## 2012-12-15 NOTE — Assessment & Plan Note (Signed)
On appropriate warfarin anticoagulation. Rate control is satisfactory. There does not appear to be a direct connection between his dyspnea and the arrhythmia. Antiarrhythmics do not appear to be indicated.

## 2012-12-15 NOTE — Assessment & Plan Note (Signed)
He has not had angina pectoris ever in the past. He has presented with fatigue and dyspnea. He had drug-eluting stents placed in the LAD artery and right coronary artery in 2006. Repeat cardiac catheterization October 2000 stent showed that these stents were widely patent and there were no new significant lesions. Normal left ventricle systolic function by echo most recently performed in 2011.

## 2012-12-15 NOTE — Progress Notes (Signed)
Patient ID: Steve Norris, male   DOB: Jul 27, 1929, 79 y.o.   MRN: [id number]    Reason for office visit Dyspnea. Coronary artery disease, paroxysmal atrophy relation, sinus node dysfunction status post pacemaker implantation  Steve Norris has recently noticed with reduction in his exercise tolerance. 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  He denies angina pectoris but never had angina when he was diagnosed with coronary disease and required placement of stents. His presentation was with syncope fatigue and shortness of breath on exertion. He had drug-eluting stents in the LAD artery and right coronary artery in 2006 . Repeat cardiac catheterization performed in 2000 and showed the stents to be patent.  He does not have edema and has not gained weight. He has not experienced syncope or dizziness. His complaints appear to be primarily of dyspnea rather than true fatigue.   Allergies  Allergen Reactions  . Penicillins     REACTION: rash/hives/swelling    Current Outpatient Prescriptions  Medication Sig Dispense Refill  . aspirin 81 MG tablet Take 81 mg by mouth daily.      Marland Kitchen atenolol (TENORMIN) 25 MG tablet Take 37.5 mg by mouth daily. Takes one tablet by mouth once daily      . b complex vitamins capsule Take 1 capsule by mouth daily.      Marland Kitchen diltiazem (CARDIZEM CD) 120 MG 24 hr capsule Take 1 capsule (120 mg total) by mouth daily.  90 capsule  2  . Docusate Calcium (STOOL SOFTENER PO) Take by mouth as directed.      . finasteride (PROSCAR) 5 MG tablet Takes one tablet by mouth once daily      . HYDROcodone-acetaminophen (NORCO/VICODIN) 5-325 MG per tablet Take 1 tablet by mouth as needed.      Marland Kitchen lisinopril (PRINIVIL,ZESTRIL) 10 MG tablet One capsule by mouth once daily      . NEXIUM 40 MG capsule One capsule by mouth once daily      . nitroGLYCERIN (NITROSTAT) 0.4 MG SL tablet Place 0.4 mg under the tongue as needed.      . Omega-3  Fatty Acids (FISH OIL) 1000 MG CAPS Take 2 capsules by mouth daily.      . Psyllium (METAMUCIL SMOOTH TEXTURE PO) Take by mouth as directed.      . rosuvastatin (CRESTOR) 5 MG tablet Take 5 mg by mouth as directed.      . SYMBICORT 160-4.5 MCG/ACT inhaler       . triazolam (HALCION) 0.25 MG tablet Take 0.25 mg by mouth at bedtime as needed.      . vitamin C (ASCORBIC ACID) 500 MG tablet Take 1,000 mg by mouth daily.       . vitamin E 400 UNIT capsule Take 400 Units by mouth daily.      Marland Kitchen warfarin (COUMADIN) 5 MG tablet Takes 1 tablet by mouth daily or as directed  90 tablet  2   No current facility-administered medications for this visit.    Past Medical History  Diagnosis Date  . Hiatal hernia 03-19-2009    EGD  . Erosive esophagitis 03-19-2009    EGD  . Barrett's esophagus 03-19-2009    EGD  . CAD (coronary artery disease)   . Paroxysmal atrial fibrillation   . Internal hemorrhoids 03-19-2009    Colonoscopy  . Diverticulosis 03-19-2009    Colonoscopy   . Arthritis   . Asthma   . Cataract  REMOVED  . COPD (chronic obstructive pulmonary disease)   . GERD (gastroesophageal reflux disease)   . Hyperlipidemia   . Hypertension     Past Surgical History  Procedure Laterality Date  . Vasectomy    . Hemorrhoid surgery    . Tonsillectomy    . Coronary stent placement      Right Coronary Artery Cypher stents placed 2006  . Cataract extraction    . Colonoscopy    . Pacemaker insertion      Family History  Problem Relation Age of Onset  . Colon cancer Neg Hx   . Heart disease Mother   . Heart disease Father     History   Social History  . Marital Status: Married    Spouse Name: N/A    Number of Children: N/A  . Years of Education: N/A   Occupational History  . Retired     Social History Main Topics  . Smoking status: Former Games developer  . Smokeless tobacco: Never Used     Comment: Quit smoking 1996  . Alcohol Use: No  . Drug Use: No  . Sexual Activity: Not  on file   Other Topics Concern  . Not on file   Social History Narrative   Daily caffeine     Review of systems: The patient specifically denies any chest pain at rest or with exertion, orthopnea, paroxysmal nocturnal dyspnea, syncope, palpitations, focal neurological deficits, intermittent claudication, lower extremity edema, unexplained weight gain, cough, hemoptysis or wheezing.  The patient also denies abdominal pain, nausea, vomiting, dysphagia, diarrhea, constipation, polyuria, polydipsia, dysuria, hematuria, frequency, urgency, abnormal bleeding or bruising, fever, chills, unexpected weight changes, mood swings, change in skin or hair texture, change in voice quality, auditory or visual problems, allergic reactions or rashes, new musculoskeletal complaints other than usual "aches and pains".   PHYSICAL EXAM BP 126/72  Pulse 60  Resp 20  Ht 5' 9.5" (1.765 m)  Wt 198 lb 11.2 oz (90.13 kg)  BMI 28.93 kg/m2  General: Alert, oriented x3, no distress Head: no evidence of trauma, PERRL, EOMI, no exophtalmos or lid lag, no myxedema, no xanthelasma; normal ears, nose and oropharynx Neck: normal jugular venous pulsations and no hepatojugular reflux; brisk carotid pulses without delay and no carotid bruits Chest: clear to auscultation, no signs of consolidation by percussion or palpation, normal fremitus, symmetrical and full respiratory excursions, Joellen [place] pacemaker site atrial pacing ventricular sinus rhythm, incomplete right bundle branch block Cardiovascular: normal position and quality of the apical impulse, regular rhythm, normal first and second heart sounds, no murmurs, rubs or gallops Abdomen: no tenderness or distention, no masses by palpation, no abnormal pulsatility or arterial bruits, normal bowel sounds, no hepatosplenomegaly Extremities: no clubbing, cyanosis or edema; 2+ radial, ulnar and brachial pulses bilaterally; 2+ right femoral, posterior tibial and dorsalis  pedis pulses; 2+ left femoral, posterior tibial and dorsalis pedis pulses; no subclavian or femoral bruits Neurological: grossly nonfocal   EKG: Atrial paced ventricular sensed rhythm, incomplete right bundle branch block without overt repolarization abnormalities   Lipid Panel  March 2014 total cholesterol 161, triglycerides 59, HDL 53, LDL 96 Creatinine 0.7, TSH 2.96, normal liver function tests, hemoglobin 13.5    ASSESSMENT AND PLAN Dyspnea This is a new complaint for Steve Norris who was always been very active. There are several possible explanations. There is no overt evidence of congestive heart failure, but his dyspnea may be an anginal equivalent. He has always had normal left ventricular systolic function,  and his complaints of fatigue and dyspnea left to the stress test but eventually diagnosis coronary problems. I've recommended that he have a [employer]. Unfortunately he cannot exercise on the treadmill and achieved sufficient heart rate in view of his conduction system disease/pacemaker. Another potential explanation would be chronotropic incompetence with insufficient adjustment of his pacemaker Center. Indeed the heart rate histograms seem to be a little blunted, and we made his symptoms are a little more aggressive today. If his nuclear perfusion study is normal, but he continues to have shortness of breath despite the pacemaker center adjustments, I would then recommend an echocardiogram. Finally, of course it is possible that his shortness of breath is noncardiac.  CAD (coronary artery disease) He has not had angina pectoris ever in the past. He has presented with fatigue and dyspnea. He had drug-eluting stents placed in the LAD artery and right coronary artery in 2006. Repeat cardiac catheterization October 2000 stent showed that these stents were widely patent and there were no new significant lesions. Normal left ventricle systolic function by echo most recently  performed in 2011.  S/P placement of cardiac pacemaker His pacemaker was implanted for chronotropic incompetence roughly 3-1/2 years ago. Review of his histograms the base shows that they're still rather blunted and an adjustment was made. However the overall appearance of his heart rate histogram is not much different from previous pacemaker visits and is less likely that this is the cause of his dyspnea Full interrogation of his pacemaker including active testing of pacing thresholds was performed in the clinic today. All the parameters are within the desirable range. There is 97% atrial pacing and only 1% ventricular pacing. Several episodes of atrial fibrillation have occurred but the overall burden of atrial arrhythmia is 7.1%. The longest episode lasted for about 1 hour and 15 minutes. Ventricular rate control during episodes of atrial fibrillation is good. The average ventricular rate is around 70-90 beats per minute. Please see separate pacemaker interrogation note.  Atrial fibrillation On appropriate warfarin anticoagulation. Rate control is satisfactory. There does not appear to be a direct connection between his dyspnea and the arrhythmia. Antiarrhythmics do not appear to be indicated.  Orders Placed This Encounter  Procedures  . Myocardial Perfusion Imaging  . Pacemaker Device Observation  . EKG 12-Lead     Maliek Schellhorn  Thurmon Fair, MD, [facility] [phone] office [phone] pager

## 2012-12-15 NOTE — Assessment & Plan Note (Signed)
His pacemaker was implanted for chronotropic incompetence roughly 3-1/2 years ago. Review of his histograms the base shows that they're still rather blunted and an adjustment was made. However the overall appearance of his heart rate histogram is not much different from previous pacemaker visits and is less likely that this is the cause of his dyspnea Full interrogation of his pacemaker including active testing of pacing thresholds was performed in the clinic today. All the parameters are within the desirable range. There is 97% atrial pacing and only 1% ventricular pacing. Several episodes of atrial fibrillation have occurred but the overall burden of atrial arrhythmia is 7.1%. The longest episode lasted for about 1 hour and 15 minutes. Ventricular rate control during episodes of atrial fibrillation is good. The average ventricular rate is around 70-90 beats per minute. Please see separate pacemaker interrogation note.

## 2012-12-15 NOTE — Patient Instructions (Addendum)
Your physician has requested that you have a lexiscan myoview. For further information please visit [email or web address] Please follow instruction sheet, as given.  Your physician recommends that you schedule a follow-up appointment in: 4 weeks with Dr.Croitoru

## 2012-12-15 NOTE — Assessment & Plan Note (Signed)
This is a new complaint for Steve Norris who was always been very active. There are several possible explanations. There is no overt evidence of congestive heart failure, but his dyspnea may be an anginal equivalent. He has always had normal left ventricular systolic function, and his complaints of fatigue and dyspnea left to the stress test but eventually diagnosis coronary problems. I've recommended that he have a [employer]. Unfortunately he cannot exercise on the treadmill and achieved sufficient heart rate in view of his conduction system disease/pacemaker. Another potential explanation would be chronotropic incompetence with insufficient adjustment of his pacemaker Center. Indeed the heart rate histograms seem to be a little blunted, and we made his symptoms are a little more aggressive today. If his nuclear perfusion study is normal, but he continues to have shortness of breath despite the pacemaker center adjustments, I would then recommend an echocardiogram. Finally, of course it is possible that his shortness of breath is noncardiac.

## 2012-12-15 NOTE — Progress Notes (Signed)
In office pacemaker interrogation. Normal device function. Minimal changes made this session. 

## 2012-12-23 ENCOUNTER — Telehealth: Payer: Self-pay | Admitting: *Deleted

## 2012-12-23 NOTE — Telephone Encounter (Signed)
Initially sent wrong chart.  Messages below copied.  Returned call and informed pt per instructions by MD/PA.  Pt verbalized understanding and agreed w/ plan.   Call Documentation    Steve Fair, MD at 12/22/2012 11:44 PM    Status: Signed             Normally wait for 4-5 days off warfarin. It is OK to pull tooth without stopping warfarin, but it will take longer for the bleeding to stop.        Chauncey Reading, RN at 12/22/2012 1:00 PM    Status: Signed             No chart in [place] office. Last OV note on Dr. Erin Hearing cart.        Steve Norris at 12/22/2012 11:34 AM    Status: Signed             Going to have tooth extracted-wants to know how long he will need to stop his Coumadin? Need to know today if possible please-have a dental appt tomorrow.

## 2013-01-05 ENCOUNTER — Ambulatory Visit (HOSPITAL_COMMUNITY)
Admission: RE | Admit: 2013-01-05 | Discharge: 2013-01-05 | Disposition: A | Discharge status: Home / Self Care | Payer: Medicare Other | Source: Ambulatory Visit | Attending: Cardiovascular Disease | Admitting: Cardiovascular Disease

## 2013-01-05 ENCOUNTER — Ambulatory Visit (INDEPENDENT_AMBULATORY_CARE_PROVIDER_SITE_OTHER): Payer: PRIVATE HEALTH INSURANCE | Admitting: Pharmacist Clinician (PhC)/ Clinical Pharmacy Specialist

## 2013-01-05 DIAGNOSIS — I10 Essential (primary) hypertension: Secondary | ICD-10-CM | POA: Insufficient documentation

## 2013-01-05 DIAGNOSIS — J449 Chronic obstructive pulmonary disease, unspecified: Secondary | ICD-10-CM | POA: Insufficient documentation

## 2013-01-05 DIAGNOSIS — E663 Overweight: Secondary | ICD-10-CM | POA: Insufficient documentation

## 2013-01-05 DIAGNOSIS — Z7901 Long term (current) use of anticoagulants: Secondary | ICD-10-CM

## 2013-01-05 DIAGNOSIS — J4489 Other specified chronic obstructive pulmonary disease: Secondary | ICD-10-CM | POA: Insufficient documentation

## 2013-01-05 DIAGNOSIS — I4891 Unspecified atrial fibrillation: Secondary | ICD-10-CM

## 2013-01-05 DIAGNOSIS — Z8249 Family history of ischemic heart disease and other diseases of the circulatory system: Secondary | ICD-10-CM | POA: Insufficient documentation

## 2013-01-05 DIAGNOSIS — I2510 Atherosclerotic heart disease of native coronary artery without angina pectoris: Secondary | ICD-10-CM | POA: Insufficient documentation

## 2013-01-05 DIAGNOSIS — Z87891 Personal history of nicotine dependence: Secondary | ICD-10-CM | POA: Insufficient documentation

## 2013-01-05 DIAGNOSIS — Z9861 Coronary angioplasty status: Secondary | ICD-10-CM | POA: Insufficient documentation

## 2013-01-05 MED ORDER — TECHNETIUM TC 99M SESTAMIBI GENERIC - CARDIOLITE
30.0000 | Freq: Once | INTRAVENOUS | Status: AC | PRN
  Administered 2013-01-05: 30 via INTRAVENOUS

## 2013-01-05 MED ORDER — REGADENOSON 0.4 MG/5ML IV SOLN
0.4000 mg | Freq: Once | INTRAVENOUS | Status: AC
  Administered 2013-01-05: 0.4 mg via INTRAVENOUS

## 2013-01-05 MED ORDER — TECHNETIUM TC 99M SESTAMIBI GENERIC - CARDIOLITE
10.0000 | Freq: Once | INTRAVENOUS | Status: AC | PRN
  Administered 2013-01-05: 10 via INTRAVENOUS

## 2013-01-05 NOTE — Procedures (Addendum)
[facility] [address] [place] [address] [place] [address] [phone]  Cardiology Nuclear Med Study  Steve Norris is a 79 y.o. male     MRN : [id number]     DOB: 1929-08-16  Procedure Date: 01/05/2013  Nuclear Med Background Indication for Stress Test:  Evaluation for Ischemia History:  COPD and CAD;STENT X2/PTCA--04/24/2005;PACER Cardiac Risk Factors: Family History - CAD, History of Smoking, Hypertension, Lipids, Overweight and A-FIB  Symptoms:  Dizziness, DOE, Fatigue, Light-Headedness, SOB and WEAKNESS   Nuclear Pre-Procedure Caffeine/Decaff Intake:  10:00pm NPO After: 8:00am   IV Site: R Hand  IV 0.9% NS with Angio Cath:  22g  Chest Size (in):  44"  IV Started by: Emmit Pomfret, RN  Height: 5\' 9"  (1.753 m)  Cup Size: n/a  BMI:  Body mass index is 29.23 kg/(m^2). Weight:  198 lb (89.812 kg)   Tech Comments:  N/A    Nuclear Med Study 1 or 2 day study: 1 day  Stress Test Type:  Lexiscan  Order Authorizing Provider:  MIHAI CROITORU,MD   Resting Radionuclide: Technetium 43m Sestamibi  Resting Radionuclide Dose: 10.4 mCi   Stress Radionuclide:  Technetium 51m Sestamibi  Stress Radionuclide Dose: 31.7 mCi           Stress Protocol Rest HR: 64 Stress HR: 66  Rest BP: 104/60 Stress BP: 116/87  Exercise Time (min): n/a METS: n/a          Dose of Adenosine (mg):  n/a Dose of Lexiscan: 0.4 mg  Dose of Atropine (mg): n/a Dose of Dobutamine: n/a mcg/kg/min (at max HR)  Stress Test Technologist: Ernestene Mention, CCT Nuclear Technologist: Koren Shiver, CNMT   Rest Procedure:  Myocardial perfusion imaging was performed at rest 45 minutes following the intravenous administration of Technetium 41m Sestamibi. Stress Procedure:  The patient received IV Lexiscan 0.4 mg over 15-seconds.  Technetium 12m Sestamibi injected at 30-seconds.  There were no significant changes with Lexiscan.  Quantitative spect images were obtained after a 45  minute delay.  Transient Ischemic Dilatation (Normal <1.22):  1.06 Lung/Heart Ratio (Normal <0.45):  0.40 QGS EDV:  104 ml QGS ESV:  43 ml LV Ejection Fraction: 58%  Rest ECG: NSR - Normal EKG  Stress ECG: No significant change from baseline ECG  QPS Raw Data Images:  Patient motion noted. Stress Images:  Decreased basal inferior activity Rest Images:  Decreased basal inferior activity Subtraction (SDS):  No evidence of ischemia.  Impression Exercise Capacity:  Lexiscan with no exercise. BP Response:  Normal blood pressure response. Clinical Symptoms:  There is dyspnea. ECG Impression:  No significant ECG changes with Lexiscan. Comparison with Prior Nuclear Study: No significant change from previous study  Overall Impression:  Low risk stress nuclear study small area of basal inferior bowel attenuation artifact..  LV Wall Motion:  NL LV Function; NL Wall Motion; EF 58%.  Chrystie Nose, MD, [facility] Board Certified in Nuclear Cardiology Attending Cardiologist [facility]  Chrystie Nose, MD  01/05/2013 1:34 PM

## 2013-01-27 ENCOUNTER — Other Ambulatory Visit: Payer: Self-pay | Admitting: Internal Medicine

## 2013-01-27 DIAGNOSIS — R109 Unspecified abdominal pain: Secondary | ICD-10-CM

## 2013-02-01 ENCOUNTER — Ambulatory Visit (INDEPENDENT_AMBULATORY_CARE_PROVIDER_SITE_OTHER): Payer: PRIVATE HEALTH INSURANCE | Admitting: Pharmacist Clinician (PhC)/ Clinical Pharmacy Specialist

## 2013-02-01 ENCOUNTER — Encounter: Payer: Self-pay | Admitting: Cardiovascular Disease

## 2013-02-01 ENCOUNTER — Ambulatory Visit (INDEPENDENT_AMBULATORY_CARE_PROVIDER_SITE_OTHER): Payer: PRIVATE HEALTH INSURANCE | Admitting: Cardiovascular Disease

## 2013-02-01 ENCOUNTER — Ambulatory Visit: Payer: PRIVATE HEALTH INSURANCE | Admitting: Cardiovascular Disease

## 2013-02-01 VITALS — BP 106/70 | HR 76 | Resp 16 | Ht 69.5 in | Wt 196.8 lb

## 2013-02-01 DIAGNOSIS — Z7901 Long term (current) use of anticoagulants: Secondary | ICD-10-CM

## 2013-02-01 DIAGNOSIS — Z950 Presence of cardiac pacemaker: Secondary | ICD-10-CM

## 2013-02-01 DIAGNOSIS — J449 Chronic obstructive pulmonary disease, unspecified: Secondary | ICD-10-CM

## 2013-02-01 DIAGNOSIS — I2510 Atherosclerotic heart disease of native coronary artery without angina pectoris: Secondary | ICD-10-CM

## 2013-02-01 DIAGNOSIS — R0600 Dyspnea, unspecified: Secondary | ICD-10-CM

## 2013-02-01 DIAGNOSIS — I4891 Unspecified atrial fibrillation: Secondary | ICD-10-CM

## 2013-02-01 DIAGNOSIS — R0609 Other forms of dyspnea: Secondary | ICD-10-CM

## 2013-02-01 MED ORDER — LISINOPRIL 5 MG PO TABS: 5.0000 mg | ORAL_TABLET | Freq: Every day | ORAL | Status: DC

## 2013-02-01 NOTE — Patient Instructions (Signed)
Your physician has recommended you make the following change in your medication: Reduce lisinopril to 5 mg once daily (it's okay to cut the 10 mg tablets in half, a new prescription for the lower dose will be sent to your pharmacy) Your physician recommends that you schedule a follow-up appointment in: 3 months with a pacemaker check on that day.

## 2013-02-01 NOTE — Progress Notes (Signed)
Patient ID: Steve Norris, male   DOB: January 24, 1930, 79 y.o.   MRN: [id number]      Reason for office visit Followup nuclear stress test, atrial fibrillation, CAD, hypertension  Steve Norris feels better. He is not sure why but his dyspnea has pretty much resolved. He still feels a little "foggy headed". His blood pressure has been on the lower end of normal. Per Dr. Lynne Logan recommendation he actually stopped his beta blocker, but he then developed rapid palpitations and restarted it at half the dose. He woke up feeling much more energetic while off the beta blocker. He denies any chest pain. His nuclear perfusion study was completely normal.   Allergies  Allergen Reactions  . Penicillins     REACTION: rash/hives/swelling    Current Outpatient Prescriptions  Medication Sig Dispense Refill  . aspirin 81 MG tablet Take 81 mg by mouth daily.      Marland Kitchen atenolol (TENORMIN) 25 MG tablet Take 25 mg by mouth daily. Takes one tablet by mouth once daily      . b complex vitamins capsule Take 1 capsule by mouth daily.      Marland Kitchen diltiazem (CARDIZEM CD) 120 MG 24 hr capsule Take 1 capsule (120 mg total) by mouth daily.  90 capsule  2  . Docusate Calcium (STOOL SOFTENER PO) Take by mouth as directed.      . finasteride (PROSCAR) 5 MG tablet Takes one tablet by mouth once daily      . HYDROcodone-acetaminophen (NORCO/VICODIN) 5-325 MG per tablet Take 1 tablet by mouth as needed.      Marland Kitchen lisinopril (PRINIVIL,ZESTRIL) 5 MG tablet Take 1 tablet (5 mg total) by mouth daily. One capsule by mouth once daily  90 tablet  3  . NEXIUM 40 MG capsule One capsule by mouth once daily      . nitroGLYCERIN (NITROSTAT) 0.4 MG SL tablet Place 0.4 mg under the tongue as needed.      . Omega-3 Fatty Acids (FISH OIL) 1000 MG CAPS Take 2 capsules by mouth daily.      . rosuvastatin (CRESTOR) 5 MG tablet Take 5 mg by mouth as directed.      . SYMBICORT 160-4.5 MCG/ACT inhaler 1 puff 2 (two) times daily.       . triazolam (HALCION)  0.25 MG tablet Take 0.25 mg by mouth at bedtime.       . vitamin C (ASCORBIC ACID) 500 MG tablet Take 1,000 mg by mouth daily.       . vitamin E 400 UNIT capsule Take 400 Units by mouth daily.      Marland Kitchen warfarin (COUMADIN) 5 MG tablet Takes 1 tablet by mouth daily or as directed  90 tablet  2   No current facility-administered medications for this visit.    Past Medical History  Diagnosis Date  . Hiatal hernia 03-19-2009    EGD  . Erosive esophagitis 03-19-2009    EGD  . Barrett's esophagus 03-19-2009    EGD  . CAD (coronary artery disease)   . Paroxysmal atrial fibrillation   . Internal hemorrhoids 03-19-2009    Colonoscopy  . Diverticulosis 03-19-2009    Colonoscopy   . Arthritis   . Asthma   . Cataract     REMOVED  . COPD (chronic obstructive pulmonary disease)   . GERD (gastroesophageal reflux disease)   . Hyperlipidemia   . Hypertension     Past Surgical History  Procedure Laterality Date  . Vasectomy    .  Hemorrhoid surgery    . Tonsillectomy    . Coronary stent placement      Right Coronary Artery Cypher stents placed 2006  . Cataract extraction    . Colonoscopy    . Pacemaker insertion      Family History  Problem Relation Age of Onset  . Colon cancer Neg Hx   . Heart disease Mother   . Heart disease Father     History   Social History  . Marital Status: Married    Spouse Name: N/A    Number of Children: N/A  . Years of Education: N/A   Occupational History  . Retired     Social History Main Topics  . Smoking status: Former Games developer  . Smokeless tobacco: Never Used     Comment: Quit smoking 1996  . Alcohol Use: No  . Drug Use: No  . Sexual Activity: Not on file   Other Topics Concern  . Not on file   Social History Narrative   Daily caffeine     Review of systems: The patient specifically denies any chest pain at rest or with exertion, orthopnea, paroxysmal nocturnal dyspnea, syncope, focal neurological deficits, intermittent  claudication, lower extremity edema, unexplained weight gain, cough, hemoptysis or wheezing.  The patient also denies abdominal pain, nausea, vomiting, dysphagia, diarrhea, constipation, polyuria, polydipsia, dysuria, hematuria, frequency, urgency, abnormal bleeding or bruising, fever, chills, unexpected weight changes, mood swings, change in skin or hair texture, change in voice quality, auditory or visual problems, allergic reactions or rashes, new musculoskeletal complaints other than usual "aches and pains".   PHYSICAL EXAM BP 106/70  Pulse 76  Resp 16  Ht 5' 9.5" (1.765 m)  Wt 196 lb 12.8 oz (89.268 kg)  BMI 28.66 kg/m2   General: Alert, oriented x3, no distress  Head: no evidence of trauma, PERRL, EOMI, no exophtalmos or lid lag, no myxedema, no xanthelasma; normal ears, nose and oropharynx  Neck: normal jugular venous pulsations and no hepatojugular reflux; brisk carotid pulses without delay and no carotid bruits  Chest: clear to auscultation, no signs of consolidation by percussion or palpation, normal fremitus, symmetrical and full respiratory excursions, Joellen [place] pacemaker site atrial pacing ventricular sinus rhythm, incomplete right bundle branch block  Cardiovascular: normal position and quality of the apical impulse, regular rhythm, normal first and second heart sounds, no murmurs, rubs or gallops  Abdomen: no tenderness or distention, no masses by palpation, no abnormal pulsatility or arterial bruits, normal bowel sounds, no hepatosplenomegaly  Extremities: no clubbing, cyanosis or edema; 2+ radial, ulnar and brachial pulses bilaterally; 2+ right femoral, posterior tibial and dorsalis pedis pulses; 2+ left femoral, posterior tibial and dorsalis pedis pulses; no subclavian or femoral bruits  Neurological: grossly nonfocal   ASSESSMENT AND PLAN  Steve Norris feels better on lower doses of beta blocker. His nuclear stress test was normal, suggesting that coronary insufficiency  has nothing to do with his dyspnea. He has normal left ventricular systolic function. His blood pressure remains borderline low and he has some vague dizziness complaints.  At last pacemaker check he had about 7% burden of atrial fibrillation. It would be preferable not to decrease the dose of beta blocker any further to prevent episodes of AF with rapid ventricular response. I have recommended that he decrease his dose of lisinopril to 5 mg daily.  Return in 3 months for full pacemaker check and clinical exam. We will make sure that he continues to have good ventricular rate control.  Meds ordered this encounter  Medications  . lisinopril (PRINIVIL,ZESTRIL) 5 MG tablet    Sig: Take 1 tablet (5 mg total) by mouth daily. One capsule by mouth once daily    Dispense:  90 tablet    Refill:  3    Obie Silos  Thurmon Fair, MD, [facility] [phone] office [phone] pager

## 2013-02-07 ENCOUNTER — Other Ambulatory Visit: Payer: PRIVATE HEALTH INSURANCE

## 2013-02-08 ENCOUNTER — Other Ambulatory Visit: Payer: PRIVATE HEALTH INSURANCE

## 2013-02-10 ENCOUNTER — Encounter: Payer: Self-pay | Admitting: Cardiology

## 2013-02-10 ENCOUNTER — Encounter: Payer: Self-pay | Admitting: Cardiovascular Disease

## 2013-03-02 ENCOUNTER — Ambulatory Visit (INDEPENDENT_AMBULATORY_CARE_PROVIDER_SITE_OTHER): Payer: Medicare Other | Admitting: Pharmacist Clinician (PhC)/ Clinical Pharmacy Specialist

## 2013-03-02 VITALS — BP 122/64 | HR 88

## 2013-03-02 DIAGNOSIS — Z7901 Long term (current) use of anticoagulants: Secondary | ICD-10-CM

## 2013-03-02 DIAGNOSIS — I4891 Unspecified atrial fibrillation: Secondary | ICD-10-CM

## 2013-03-19 LAB — PACEMAKER DEVICE OBSERVATION

## 2013-03-29 ENCOUNTER — Ambulatory Visit (INDEPENDENT_AMBULATORY_CARE_PROVIDER_SITE_OTHER): Payer: PRIVATE HEALTH INSURANCE | Admitting: Pharmacist Clinician (PhC)/ Clinical Pharmacy Specialist

## 2013-03-29 VITALS — BP 116/60 | HR 80

## 2013-03-29 DIAGNOSIS — Z7901 Long term (current) use of anticoagulants: Secondary | ICD-10-CM

## 2013-03-29 DIAGNOSIS — I4891 Unspecified atrial fibrillation: Secondary | ICD-10-CM

## 2013-03-29 LAB — POCT INR: INR: 2.9

## 2013-03-31 ENCOUNTER — Telehealth: Payer: Self-pay | Admitting: Cardiovascular Disease

## 2013-03-31 ENCOUNTER — Encounter: Payer: Self-pay | Admitting: *Deleted

## 2013-03-31 DIAGNOSIS — I4891 Unspecified atrial fibrillation: Secondary | ICD-10-CM

## 2013-03-31 DIAGNOSIS — R0600 Dyspnea, unspecified: Secondary | ICD-10-CM

## 2013-03-31 NOTE — Telephone Encounter (Signed)
Returned call and pt verified x 2.  Pt stated he has not been feeling well over the past 2-4 weeks.  Stated he feels more short-winded, fuzzy-headed and has no energy.  Stated he almost blacked out about 2 weeks ago and it was like he was before he had the pacemaker put in.  Pt stated he was under a lot of pressure during the time he almost blacked out too.  Stated he thought he remembered Dr. Salena Saner mentioning he would do some other tests after the stress test if he was still having symptoms, but he was better after the stress test and declined further testing.  Pt stated he thinks he might need to do them now.  Pt stated he thinks Dr. Salena Saner mentioned an ultrasound or cath.    RN offered appt for pt to see an Extender since it has been 2 mos since last OV and pt symptomatic now.  Pt declined and would like Dr. Salena Saner to be notified.  Pt informed Dr. Salena Saner will be notified for further instructions.  Pt verbalized understanding and agreed w/ plan.  Pt also stated he will come in to discuss his options if Dr. Salena Saner thinks he should.  Also stated he did a transmission on 11.24.14.  Message forwarded to Dr. Royann Shivers for further instructions.  Message forwarded to S. Lelon Perla, CMA to check transmission.

## 2013-03-31 NOTE — Telephone Encounter (Signed)
Dr. Salena Saner notified and advised order echo and pt to f/u with him afterwards.  Returned call and informed pt per instructions by MD.  Pt verbalized understanding and agreed w/ plan.  Informed scheduling will contact him to set up appt.  Pt verbalized understanding and agreed w/ plan.

## 2013-03-31 NOTE — Telephone Encounter (Signed)
Wants to let Dr C know that he feels like he needs to go ahead and get more testing.  Please call (had felt better after stress test in Sept) but now thinks he may need to test more.

## 2013-04-18 ENCOUNTER — Ambulatory Visit (HOSPITAL_COMMUNITY)
Admission: RE | Admit: 2013-04-18 | Discharge: 2013-04-18 | Disposition: A | Discharge status: Home / Self Care | Payer: Medicare Other | Source: Ambulatory Visit | Attending: Cardiovascular Disease | Admitting: Cardiovascular Disease

## 2013-04-18 DIAGNOSIS — J449 Chronic obstructive pulmonary disease, unspecified: Secondary | ICD-10-CM | POA: Insufficient documentation

## 2013-04-18 DIAGNOSIS — R0600 Dyspnea, unspecified: Secondary | ICD-10-CM

## 2013-04-18 DIAGNOSIS — R0989 Other specified symptoms and signs involving the circulatory and respiratory systems: Secondary | ICD-10-CM | POA: Insufficient documentation

## 2013-04-18 DIAGNOSIS — R0609 Other forms of dyspnea: Secondary | ICD-10-CM | POA: Insufficient documentation

## 2013-04-18 DIAGNOSIS — I2510 Atherosclerotic heart disease of native coronary artery without angina pectoris: Secondary | ICD-10-CM | POA: Insufficient documentation

## 2013-04-18 DIAGNOSIS — J4489 Other specified chronic obstructive pulmonary disease: Secondary | ICD-10-CM | POA: Insufficient documentation

## 2013-04-18 DIAGNOSIS — I4891 Unspecified atrial fibrillation: Secondary | ICD-10-CM

## 2013-04-18 NOTE — Progress Notes (Signed)
2D Echo Performed 04/18/2013    Kenzli Barritt, RCS  

## 2013-04-26 ENCOUNTER — Ambulatory Visit (INDEPENDENT_AMBULATORY_CARE_PROVIDER_SITE_OTHER): Payer: PRIVATE HEALTH INSURANCE | Admitting: Pharmacist Clinician (PhC)/ Clinical Pharmacy Specialist

## 2013-04-26 VITALS — BP 130/62 | HR 80

## 2013-04-26 DIAGNOSIS — Z7901 Long term (current) use of anticoagulants: Secondary | ICD-10-CM

## 2013-04-26 DIAGNOSIS — I4891 Unspecified atrial fibrillation: Secondary | ICD-10-CM

## 2013-04-26 LAB — POCT INR: INR: 2.4

## 2013-05-06 ENCOUNTER — Other Ambulatory Visit: Payer: Self-pay | Admitting: *Deleted

## 2013-05-06 MED ORDER — LISINOPRIL 5 MG PO TABS: 5.0000 mg | ORAL_TABLET | Freq: Every day | ORAL | Status: DC

## 2013-05-06 MED ORDER — ATENOLOL 25 MG PO TABS: 25.0000 mg | ORAL_TABLET | Freq: Every day | ORAL | Status: DC

## 2013-05-06 MED ORDER — DILTIAZEM HCL ER COATED BEADS 120 MG PO CP24: 120.0000 mg | ORAL_CAPSULE | Freq: Every day | ORAL | Status: DC

## 2013-05-06 MED ORDER — WARFARIN SODIUM 5 MG PO TABS: ORAL_TABLET | ORAL | Status: DC

## 2013-05-13 ENCOUNTER — Telehealth: Payer: Self-pay | Admitting: Cardiovascular Disease

## 2013-05-13 MED ORDER — ATENOLOL 25 MG PO TABS: 25.0000 mg | ORAL_TABLET | Freq: Every day | ORAL | Status: DC

## 2013-05-13 MED ORDER — DILTIAZEM HCL ER COATED BEADS 120 MG PO CP24: 120.0000 mg | ORAL_CAPSULE | Freq: Every day | ORAL | Status: DC

## 2013-05-13 MED ORDER — LISINOPRIL 5 MG PO TABS: 5.0000 mg | ORAL_TABLET | Freq: Every day | ORAL | Status: DC

## 2013-05-13 MED ORDER — WARFARIN SODIUM 5 MG PO TABS: ORAL_TABLET | ORAL | Status: DC

## 2013-05-13 NOTE — Telephone Encounter (Signed)
Refill(s) sent to pharmacy: [facility].

## 2013-05-13 NOTE — Telephone Encounter (Signed)
Steve Norris is calling because his prescriptions were sent to his local pharmacy and he needs them to be sent to [facility] for Refilling .Marland Kitchen The medications are Diltiazem120 mg,  Atenolol 25mg , Lisinopril 32mg  and Warfrain 5mg  .. Please call if you have any questions .Marland Kitchen    Thanks

## 2013-06-06 ENCOUNTER — Encounter: Payer: Self-pay | Admitting: *Deleted

## 2013-06-07 ENCOUNTER — Ambulatory Visit (INDEPENDENT_AMBULATORY_CARE_PROVIDER_SITE_OTHER): Payer: Medicare HMO | Admitting: Pharmacist Clinician (PhC)/ Clinical Pharmacy Specialist

## 2013-06-07 ENCOUNTER — Encounter: Payer: Self-pay | Admitting: Cardiovascular Disease

## 2013-06-07 ENCOUNTER — Ambulatory Visit (INDEPENDENT_AMBULATORY_CARE_PROVIDER_SITE_OTHER): Payer: Medicare HMO | Admitting: Cardiovascular Disease

## 2013-06-07 VITALS — BP 140/70 | HR 60 | Resp 16 | Ht 69.75 in | Wt 200.2 lb

## 2013-06-07 DIAGNOSIS — R079 Chest pain, unspecified: Secondary | ICD-10-CM

## 2013-06-07 DIAGNOSIS — I4891 Unspecified atrial fibrillation: Secondary | ICD-10-CM

## 2013-06-07 DIAGNOSIS — Z7901 Long term (current) use of anticoagulants: Secondary | ICD-10-CM

## 2013-06-07 DIAGNOSIS — Z79899 Other long term (current) drug therapy: Secondary | ICD-10-CM

## 2013-06-07 DIAGNOSIS — R0602 Shortness of breath: Secondary | ICD-10-CM

## 2013-06-07 DIAGNOSIS — I2510 Atherosclerotic heart disease of native coronary artery without angina pectoris: Secondary | ICD-10-CM

## 2013-06-07 DIAGNOSIS — R5383 Other fatigue: Secondary | ICD-10-CM

## 2013-06-07 DIAGNOSIS — R5381 Other malaise: Secondary | ICD-10-CM

## 2013-06-07 DIAGNOSIS — D689 Coagulation defect, unspecified: Secondary | ICD-10-CM

## 2013-06-07 LAB — PACEMAKER DEVICE OBSERVATION

## 2013-06-07 LAB — MDC_IDC_ENUM_SESS_TYPE_INCLINIC
Battery Voltage: 2.95 V
Brady Statistic RA Percent Paced: 91 %
Implantable Pulse Generator Model: 2210
Lead Channel Pacing Threshold Amplitude: 0.5 V
Lead Channel Sensing Intrinsic Amplitude: 10 mV
Lead Channel Sensing Intrinsic Amplitude: 2.4 mV
Lead Channel Setting Pacing Amplitude: 2.5 V
Lead Channel Setting Pacing Pulse Width: 0.4 ms
MDC IDC MSMT LEADCHNL RA IMPEDANCE VALUE: 360 Ohm
MDC IDC MSMT LEADCHNL RA PACING THRESHOLD PULSEWIDTH: 0.4 ms
MDC IDC MSMT LEADCHNL RV IMPEDANCE VALUE: 490 Ohm
MDC IDC MSMT LEADCHNL RV PACING THRESHOLD AMPLITUDE: 0.75 V
MDC IDC MSMT LEADCHNL RV PACING THRESHOLD PULSEWIDTH: 0.4 ms
MDC IDC PG SERIAL: 7124006
MDC IDC SET LEADCHNL RA PACING AMPLITUDE: 2 V
MDC IDC SET LEADCHNL RV SENSING SENSITIVITY: 2 mV
MDC IDC STAT BRADY RV PERCENT PACED: 4.9 %

## 2013-06-07 LAB — POCT INR: INR: 2.5

## 2013-06-07 MED ORDER — NEBIVOLOL HCL 5 MG PO TABS
5.0000 mg | ORAL_TABLET | Freq: Every day | ORAL | Status: DC
Start: 2013-06-07 — End: 2013-06-24

## 2013-06-07 NOTE — Patient Instructions (Signed)
STOP Atenolol.  START Bystolic 5mg  daily.  Samples given to last 5 weeks.  If no side effects we need to send a prescription to your pharmacy.  Please call and allow [place] enough time for mail order.  Your physician recommends that you return for lab work in: 5 days prior to the cardiac cath.   Dr. Sallyanne Kuster has requested that you have a cardiac catheterization right radial.  You will need to HOLD your Coumadin 4-5 days prior to cath. Cardiac catheterization is used to diagnose and/or treat various heart conditions. Doctors may recommend this procedure for a number of different reasons. The most common reason is to evaluate chest pain. Chest pain can be a symptom of coronary artery disease (CAD), and cardiac catheterization can show whether plaque is narrowing or blocking your heart's arteries. This procedure is also used to evaluate the valves, as well as measure the blood flow and oxygen levels in different parts of your heart. For further information please visit [email or web address]. Please follow instruction sheet, as given.

## 2013-06-09 ENCOUNTER — Encounter: Payer: Self-pay | Admitting: Cardiovascular Disease

## 2013-06-09 NOTE — Progress Notes (Signed)
Patient ID: Steve Norris, male   DOB: July 17, 1929, 80 y.o.   MRN: [id number]      Reason for office visit Chest tightness  Steve Norris continues to feel unwell. He has a strange discomfort in the left chest that increases with only minimal activity. He feels very weak at times and is frequently dizzy. He has mild shortness of breath. He states that his symptoms are quite reminiscent of his presentation with coronary disease in the past. His presentation was with syncope fatigue and shortness of breath on exertion. He had drug-eluting stents in the LAD artery and right coronary artery in 2006 . Repeat cardiac catheterization performed in 2010 and showed the stents to be patent. He has paroxysmal atrial fibrillation , recurrent ectopic atrial tachycardia and sinus node dysfunction with tachycardia-bradycardia syndrome and received a dual chamber St. Jude pacemaker in March 2011. Recently the burden of atrial fibrillation has been 6%, similar to his historical trend. He had a lengthy episode of atrial fibrillation in late January and during that time his clinical complaints were worse. He felt weaker and dizzy her and more short of breath than usual. However his symptoms persist even when he is back in normal rhythm.  A nuclear perfusion study performed last September showed low risk findings with what was interpreted as being an area of inferior wall attenuation artifact. LVEF 58%.    Allergies  Allergen Reactions  . Penicillins     REACTION: rash/hives/swelling  . Simvastatin     Current Outpatient Prescriptions  Medication Sig Dispense Refill  . aspirin 81 MG tablet Take 81 mg by mouth daily.      Marland Kitchen b complex vitamins capsule Take 1 capsule by mouth daily.      Marland Kitchen diltiazem (CARDIZEM CD) 120 MG 24 hr capsule Take 1 capsule (120 mg total) by mouth daily.  90 capsule  2  . finasteride (PROSCAR) 5 MG tablet Takes one tablet by mouth once daily      . lisinopril (PRINIVIL,ZESTRIL) 5 MG tablet  Take 10 mg by mouth daily. One capsule by mouth once daily      . NEXIUM 40 MG capsule One capsule by mouth once daily      . nitroGLYCERIN (NITROSTAT) 0.4 MG SL tablet Place 0.4 mg under the tongue as needed.      . Omega-3 Fatty Acids (FISH OIL) 1000 MG CAPS Take 2 capsules by mouth daily.      . rosuvastatin (CRESTOR) 5 MG tablet Take 5 mg by mouth as directed.      . SENNA PO Take 1 tablet by mouth at bedtime.      . SYMBICORT 160-4.5 MCG/ACT inhaler 1 puff 2 (two) times daily.       . vitamin C (ASCORBIC ACID) 500 MG tablet Take 1,000 mg by mouth daily.       . vitamin E 400 UNIT capsule Take 400 Units by mouth daily.      Marland Kitchen warfarin (COUMADIN) 5 MG tablet Takes 1 tablet by mouth daily or as directed  90 tablet  2  . nebivolol (BYSTOLIC) 5 MG tablet Take 1 tablet (5 mg total) by mouth daily.  35 tablet  0  . temazepam (RESTORIL) 30 MG capsule Take 30 mg by mouth at bedtime.       No current facility-administered medications for this visit.    Past Medical History  Diagnosis Date  . Hiatal hernia 03-19-2009    EGD  . Erosive esophagitis 03-19-2009  EGD  . Barrett's esophagus 03-19-2009    EGD  . CAD (coronary artery disease)   . Paroxysmal atrial fibrillation   . Internal hemorrhoids 03-19-2009    Colonoscopy  . Diverticulosis 03-19-2009    Colonoscopy   . Arthritis   . Asthma   . Cataract     REMOVED  . COPD (chronic obstructive pulmonary disease)   . GERD (gastroesophageal reflux disease)   . Hyperlipidemia   . Hypertension     Past Surgical History  Procedure Laterality Date  . Vasectomy    . Hemorrhoid surgery    . Tonsillectomy    . Coronary stent placement  2006    Right Coronary Artery Cypher stents placed 2006  . Cataract extraction    . Colonoscopy    . Pacemaker insertion  07/25/2009    St.Jude Accent  . Cardiac catheterization  02/12/2009    mild ostial left main disease,patent LAD & RCA stents   . US echocardiography  07/12/2009    borderline LA  enlargement,mild mitral annular ca+, AOV mildly sclerotic,trace AI.  Marland Kitchen Nm myocar perf wall motion  08/14/2008    no significant ischemia EF 64%    Family History  Problem Relation Age of Onset  . Colon cancer Neg Hx   . Heart disease Mother   . Heart disease Father     History   Social History  . Marital Status: Married    Spouse Name: N/A    Number of Children: N/A  . Years of Education: N/A   Occupational History  . Retired     Social History Main Topics  . Smoking status: Former Research scientist (life sciences)  . Smokeless tobacco: Never Used     Comment: Quit smoking 1996  . Alcohol Use: No  . Drug Use: No  . Sexual Activity: Not on file   Other Topics Concern  . Not on file   Social History Narrative   Daily caffeine     Review of systems: He has fatigue, dizziness, shortness of breath on exertion and recurrent episodes of left-sided chest discomfort. He denies orthopnea, paroxysmal nocturnal dyspnea, syncope, palpitations, focal neurological deficits, intermittent claudication, lower extremity edema, unexplained weight gain, cough, hemoptysis or wheezing.  The patient also denies abdominal pain, nausea, vomiting, dysphagia, diarrhea, constipation, polyuria, polydipsia, dysuria, hematuria, frequency, urgency, abnormal bleeding or bruising, fever, chills, unexpected weight changes, mood swings, change in skin or hair texture, change in voice quality, auditory or visual problems, allergic reactions or rashes, new musculoskeletal complaints other than usual "aches and pains".   PHYSICAL EXAM BP 140/70  Pulse 60  Resp 16  Ht 5' 9.75" (1.772 m)  Wt 90.81 kg (200 lb 3.2 oz)  BMI 28.92 kg/m2  General: Alert, oriented x3, no distress Head: no evidence of trauma, PERRL, EOMI, no exophtalmos or lid lag, no myxedema, no xanthelasma; normal ears, nose and oropharynxNeck: normal jugular venous pulsations and no hepatojugular reflux; brisk carotid pulses without delay and no carotid bruits Chest:  clear to auscultation, no signs of consolidation by percussion or palpation, normal fremitus, symmetrical and full respiratory , left subclavian pacemaker site appears healthy Cardiovascular: normal position and quality of the apical impulse, regular rhythm, normal first and second heart sounds, no murmurs, rubs or gallops Abdomen: no tenderness or distention, no masses by palpation, no abnormal pulsatility or arterial bruits, normal bowel sounds, no hepatosplenomegaly Extremities: no clubbing, cyanosis or edema; 2+ radial, ulnar and brachial pulses bilaterally; 2+ right femoral, posterior tibial and dorsalis pedis  pulses; 2+ left femoral, posterior tibial and dorsalis pedis pulses; no subclavian or femoral bruits Neurological: grossly nonfocal   EKG: Atrial paced, ventricular sensed, incomplete right bundle branch block, no repolarization abnormalities  Lipid Panel March 2014 total cholesterol 161, trig was read 59, HDL 53, LDL 96 Creatinine 0.7   ASSESSMENT AND PLAN  Mr. Mclean continues to feel unwell. Until a few months ago he was physically very active and had no complaints of dyspnea or chest discomfort. His symptoms are now atypical but persistent. They're worsened by atrial tachyarrhythmias but are present even when he is in normal sinus rhythm. He has a history of previously placed stents in the LAD artery and right coronary artery. His nuclear stress test 6 months ago was mildly abnormal with an interval defect that was felt to be secondary to attenuation artifact. Symptoms persist despite beta blocker and calcium channel blocker therapy. His symptoms may indeed be an equivalent of angina and since he has a history of multivessel disease, I don't think can rely solely on the nuclear scan. I have recommended that he have cardiac catheterization and coronary angiography. He is on full dose warfarin anticoagulation which will have to be stopped. It would be preferable to perform his angiogram  via a radial approach.  This procedure has been fully reviewed with the patient and informed consent has been obtained.   Orders Placed This Encounter  Procedures  . CBC  . APTT  . Protime-INR  . Comprehensive metabolic panel  . Implantable device check  . EKG 12-Lead  . LEFT HEART CATHETERIZATION WITH CORONARY ANGIOGRAM   Meds ordered this encounter  Medications  . lisinopril (PRINIVIL,ZESTRIL) 5 MG tablet    Sig: Take 10 mg by mouth daily. One capsule by mouth once daily  . temazepam (RESTORIL) 30 MG capsule    Sig: Take 30 mg by mouth at bedtime.  . nebivolol (BYSTOLIC) 5 MG tablet    Sig: Take 1 tablet (5 mg total) by mouth daily.    Dispense:  35 tablet    Refill:  0    INV # [id number] EXP: 1/17    Holli Humbles, MD, [facility] [phone] office [phone] pager

## 2013-06-16 ENCOUNTER — Other Ambulatory Visit: Payer: Self-pay | Admitting: *Deleted

## 2013-06-16 DIAGNOSIS — R0600 Dyspnea, unspecified: Secondary | ICD-10-CM

## 2013-06-16 DIAGNOSIS — R079 Chest pain, unspecified: Secondary | ICD-10-CM

## 2013-06-20 ENCOUNTER — Telehealth: Payer: Self-pay | Admitting: *Deleted

## 2013-06-20 NOTE — Telephone Encounter (Signed)
Forwarded to [place], [place].

## 2013-06-20 NOTE — Telephone Encounter (Addendum)
Pt is calling regards to his heart cath being prior authorized and he also wanted to know about his labs being sent to his PCP, along with all notes that are taken when he comes in for a visit. Dr. Minna Antis is his PCP. Also had a question about when to stop his coumadin for his cath.  [facility]

## 2013-06-23 NOTE — Telephone Encounter (Signed)
06/23/13 Olin and message left with instructions.

## 2013-06-24 ENCOUNTER — Telehealth: Payer: Self-pay | Admitting: Cardiovascular Disease

## 2013-06-24 MED ORDER — NEBIVOLOL HCL 5 MG PO TABS: 5.0000 mg | ORAL_TABLET | Freq: Every day | ORAL | Status: DC

## 2013-06-24 NOTE — Telephone Encounter (Signed)
Need mew prescription for his Bystolic. First time for this prescription pt have been on samples.s

## 2013-06-24 NOTE — Telephone Encounter (Signed)
Rx sent to pharmacy   

## 2013-06-27 ENCOUNTER — Encounter (HOSPITAL_COMMUNITY): Payer: Self-pay | Admitting: Pharmacy Technician

## 2013-06-27 LAB — COMPREHENSIVE METABOLIC PANEL
ALK PHOS: 86 U/L (ref 39–117)
ALT: 14 U/L (ref 0–53)
AST: 25 U/L (ref 0–37)
Albumin: 3.5 g/dL (ref 3.5–5.2)
BILIRUBIN TOTAL: 0.5 mg/dL (ref 0.2–1.2)
BUN: 12 mg/dL (ref 6–23)
CO2: 28 mEq/L (ref 19–32)
CREATININE: 0.62 mg/dL (ref 0.50–1.35)
Calcium: 8.9 mg/dL (ref 8.4–10.5)
Chloride: 103 mEq/L (ref 96–112)
GLUCOSE: 84 mg/dL (ref 70–99)
Potassium: 4.5 mEq/L (ref 3.5–5.3)
SODIUM: 139 meq/L (ref 135–145)
TOTAL PROTEIN: 6.3 g/dL (ref 6.0–8.3)

## 2013-06-27 LAB — CBC
HCT: 38.1 % — ABNORMAL LOW (ref 39.0–52.0)
Hemoglobin: 12.1 g/dL — ABNORMAL LOW (ref 13.0–17.0)
MCH: 29.8 pg (ref 26.0–34.0)
MCHC: 31.8 g/dL (ref 30.0–36.0)
MCV: 93.8 fL (ref 78.0–100.0)
PLATELETS: 330 10*3/uL (ref 150–400)
RBC: 4.06 MIL/uL — AB (ref 4.22–5.81)
RDW: 14.5 % (ref 11.5–15.5)
WBC: 7.8 10*3/uL (ref 4.0–10.5)

## 2013-06-28 LAB — APTT: APTT: 37 s (ref 24–37)

## 2013-06-28 LAB — PROTIME-INR
INR: 2.05 — AB (ref <1.50)
Prothrombin Time: 22.7 seconds — ABNORMAL HIGH (ref 11.6–15.2)

## 2013-07-01 ENCOUNTER — Ambulatory Visit (HOSPITAL_COMMUNITY)
Admission: RE | Admit: 2013-07-01 | Discharge: 2013-07-01 | Disposition: A | Discharge status: Home / Self Care | Payer: Medicare HMO | Source: Ambulatory Visit | Attending: Cardiovascular Disease | Admitting: Cardiovascular Disease

## 2013-07-01 ENCOUNTER — Encounter (HOSPITAL_COMMUNITY)
Admission: RE | Disposition: A | Discharge status: Home / Self Care | Payer: Self-pay | Source: Ambulatory Visit | Attending: Cardiovascular Disease

## 2013-07-01 DIAGNOSIS — I2510 Atherosclerotic heart disease of native coronary artery without angina pectoris: Secondary | ICD-10-CM | POA: Insufficient documentation

## 2013-07-01 DIAGNOSIS — Z7982 Long term (current) use of aspirin: Secondary | ICD-10-CM | POA: Insufficient documentation

## 2013-07-01 DIAGNOSIS — I4891 Unspecified atrial fibrillation: Secondary | ICD-10-CM | POA: Insufficient documentation

## 2013-07-01 DIAGNOSIS — I495 Sick sinus syndrome: Secondary | ICD-10-CM | POA: Insufficient documentation

## 2013-07-01 DIAGNOSIS — K219 Gastro-esophageal reflux disease without esophagitis: Secondary | ICD-10-CM | POA: Insufficient documentation

## 2013-07-01 DIAGNOSIS — Z9861 Coronary angioplasty status: Secondary | ICD-10-CM | POA: Insufficient documentation

## 2013-07-01 DIAGNOSIS — R0600 Dyspnea, unspecified: Secondary | ICD-10-CM

## 2013-07-01 DIAGNOSIS — E785 Hyperlipidemia, unspecified: Secondary | ICD-10-CM | POA: Insufficient documentation

## 2013-07-01 DIAGNOSIS — J4489 Other specified chronic obstructive pulmonary disease: Secondary | ICD-10-CM | POA: Insufficient documentation

## 2013-07-01 DIAGNOSIS — J449 Chronic obstructive pulmonary disease, unspecified: Secondary | ICD-10-CM | POA: Insufficient documentation

## 2013-07-01 DIAGNOSIS — Y849 Medical procedure, unspecified as the cause of abnormal reaction of the patient, or of later complication, without mention of misadventure at the time of the procedure: Secondary | ICD-10-CM | POA: Insufficient documentation

## 2013-07-01 DIAGNOSIS — R079 Chest pain, unspecified: Secondary | ICD-10-CM

## 2013-07-01 DIAGNOSIS — Z950 Presence of cardiac pacemaker: Secondary | ICD-10-CM | POA: Insufficient documentation

## 2013-07-01 DIAGNOSIS — Z87891 Personal history of nicotine dependence: Secondary | ICD-10-CM | POA: Insufficient documentation

## 2013-07-01 DIAGNOSIS — I10 Essential (primary) hypertension: Secondary | ICD-10-CM | POA: Insufficient documentation

## 2013-07-01 DIAGNOSIS — Z7901 Long term (current) use of anticoagulants: Secondary | ICD-10-CM | POA: Insufficient documentation

## 2013-07-01 DIAGNOSIS — I209 Angina pectoris, unspecified: Secondary | ICD-10-CM | POA: Insufficient documentation

## 2013-07-01 DIAGNOSIS — K2270 Barrett's esophagus without dysplasia: Secondary | ICD-10-CM | POA: Insufficient documentation

## 2013-07-01 HISTORY — PX: LEFT HEART CATHETERIZATION WITH CORONARY ANGIOGRAM: SHX5451

## 2013-07-01 LAB — PROTIME-INR
INR: 1.05 (ref 0.00–1.49)
Prothrombin Time: 13.5 seconds (ref 11.6–15.2)

## 2013-07-01 SURGERY — LEFT HEART CATHETERIZATION WITH CORONARY ANGIOGRAM
Anesthesia: LOCAL

## 2013-07-01 MED ORDER — ASPIRIN 81 MG PO CHEW: 81.0000 mg | CHEWABLE_TABLET | Freq: Every day | ORAL | Status: DC

## 2013-07-01 MED ORDER — DIAZEPAM 5 MG PO TABS
ORAL_TABLET | ORAL | Status: AC
  Administered 2013-07-01: 5 mg
  Filled 2013-07-01: qty 1

## 2013-07-01 MED ORDER — LIDOCAINE HCL (PF) 1 % IJ SOLN
INTRAMUSCULAR | Status: AC
  Filled 2013-07-01: qty 30

## 2013-07-01 MED ORDER — HEPARIN (PORCINE) IN NACL 2-0.9 UNIT/ML-% IJ SOLN
INTRAMUSCULAR | Status: AC
  Filled 2013-07-01: qty 1000

## 2013-07-01 MED ORDER — FENTANYL CITRATE 0.05 MG/ML IJ SOLN
INTRAMUSCULAR | Status: AC
  Filled 2013-07-01: qty 2

## 2013-07-01 MED ORDER — MIDAZOLAM HCL 2 MG/2ML IJ SOLN
INTRAMUSCULAR | Status: AC
  Filled 2013-07-01: qty 2

## 2013-07-01 MED ORDER — HEPARIN SODIUM (PORCINE) 1000 UNIT/ML IJ SOLN
INTRAMUSCULAR | Status: AC
  Filled 2013-07-01: qty 1

## 2013-07-01 MED ORDER — VERAPAMIL HCL 2.5 MG/ML IV SOLN
INTRAVENOUS | Status: AC
  Filled 2013-07-01: qty 2

## 2013-07-01 MED ORDER — SODIUM CHLORIDE 0.9 % IV SOLN
INTRAVENOUS | Status: DC
  Administered 2013-07-01: 14:00:00 via INTRAVENOUS

## 2013-07-01 MED ORDER — SODIUM CHLORIDE 0.9 % IJ SOLN: 3.0000 mL | INTRAMUSCULAR | Status: DC | PRN

## 2013-07-01 MED ORDER — DIAZEPAM 5 MG PO TABS: 5.0000 mg | ORAL_TABLET | ORAL | Status: DC

## 2013-07-01 MED ORDER — NITROGLYCERIN 0.2 MG/ML ON CALL CATH LAB
INTRAVENOUS | Status: AC
  Filled 2013-07-01: qty 1

## 2013-07-01 NOTE — H&P (View-Only) (Signed)
Patient ID: Steve Norris, male   DOB: [birth year], 80 y.o.   MRN: [id number]      Reason for office visit Chest tightness  Steve Norris continues to feel unwell. He has a strange discomfort in the left chest that increases with only minimal activity. He feels very weak at times and is frequently dizzy. He has mild shortness of breath. He states that his symptoms are quite reminiscent of his presentation with coronary disease in the past. His presentation was with syncope fatigue and shortness of breath on exertion. He had drug-eluting stents in the LAD artery and right coronary artery in 2006 . Repeat cardiac catheterization performed in 2010 and showed the stents to be patent. He has paroxysmal atrial fibrillation , recurrent ectopic atrial tachycardia and sinus node dysfunction with tachycardia-bradycardia syndrome and received a dual chamber St. Jude pacemaker in March 2011. Recently the burden of atrial fibrillation has been 6%, similar to his historical trend. He had a lengthy episode of atrial fibrillation in late January and during that time his clinical complaints were worse. He felt weaker and dizzy her and more short of breath than usual. However his symptoms persist even when he is back in normal rhythm.  A nuclear perfusion study performed last September showed low risk findings with what was interpreted as being an area of inferior wall attenuation artifact. LVEF 58%.    Allergies  Allergen Reactions  . Penicillins     REACTION: rash/hives/swelling  . Simvastatin     Current Outpatient Prescriptions  Medication Sig Dispense Refill  . aspirin 81 MG tablet Take 81 mg by mouth daily.      Marland Kitchen b complex vitamins capsule Take 1 capsule by mouth daily.      Marland Kitchen diltiazem (CARDIZEM CD) 120 MG 24 hr capsule Take 1 capsule (120 mg total) by mouth daily.  90 capsule  2  . finasteride (PROSCAR) 5 MG tablet Takes one tablet by mouth once daily      . lisinopril (PRINIVIL,ZESTRIL) 5 MG tablet  Take 10 mg by mouth daily. One capsule by mouth once daily      . NEXIUM 40 MG capsule One capsule by mouth once daily      . nitroGLYCERIN (NITROSTAT) 0.4 MG SL tablet Place 0.4 mg under the tongue as needed.      . Omega-3 Fatty Acids (FISH OIL) 1000 MG CAPS Take 2 capsules by mouth daily.      . rosuvastatin (CRESTOR) 5 MG tablet Take 5 mg by mouth as directed.      . SENNA PO Take 1 tablet by mouth at bedtime.      . SYMBICORT 160-4.5 MCG/ACT inhaler 1 puff 2 (two) times daily.       . vitamin C (ASCORBIC ACID) 500 MG tablet Take 1,000 mg by mouth daily.       . vitamin E 400 UNIT capsule Take 400 Units by mouth daily.      Marland Kitchen warfarin (COUMADIN) 5 MG tablet Takes 1 tablet by mouth daily or as directed  90 tablet  2  . nebivolol (BYSTOLIC) 5 MG tablet Take 1 tablet (5 mg total) by mouth daily.  35 tablet  0  . temazepam (RESTORIL) 30 MG capsule Take 30 mg by mouth at bedtime.       No current facility-administered medications for this visit.    Past Medical History  Diagnosis Date  . Hiatal hernia 03-19-2009    EGD  . Erosive esophagitis 03-19-2009  EGD  . Barrett's esophagus 03-19-2009    EGD  . CAD (coronary artery disease)   . Paroxysmal atrial fibrillation   . Internal hemorrhoids 03-19-2009    Colonoscopy  . Diverticulosis 03-19-2009    Colonoscopy   . Arthritis   . Asthma   . Cataract     REMOVED  . COPD (chronic obstructive pulmonary disease)   . GERD (gastroesophageal reflux disease)   . Hyperlipidemia   . Hypertension     Past Surgical History  Procedure Laterality Date  . Vasectomy    . Hemorrhoid surgery    . Tonsillectomy    . Coronary stent placement  2006    Right Coronary Artery Cypher stents placed 2006  . Cataract extraction    . Colonoscopy    . Pacemaker insertion  07/25/2009    St.Jude Accent  . Cardiac catheterization  02/12/2009    mild ostial left main disease,patent LAD & RCA stents   . US echocardiography  07/12/2009    borderline LA  enlargement,mild mitral annular ca+, AOV mildly sclerotic,trace AI.  Marland Kitchen Nm myocar perf wall motion  08/14/2008    no significant ischemia EF 64%    Family History  Problem Relation Age of Onset  . Colon cancer Neg Hx   . Heart disease Mother   . Heart disease Father     History   Social History  . Marital Status: Married    Spouse Name: N/A    Number of Children: N/A  . Years of Education: N/A   Occupational History  . Retired     Social History Main Topics  . Smoking status: Former Research scientist (life sciences)  . Smokeless tobacco: Never Used     Comment: Quit smoking 1996  . Alcohol Use: No  . Drug Use: No  . Sexual Activity: Not on file   Other Topics Concern  . Not on file   Social History Narrative   Daily caffeine     Review of systems: He has fatigue, dizziness, shortness of breath on exertion and recurrent episodes of left-sided chest discomfort. He denies orthopnea, paroxysmal nocturnal dyspnea, syncope, palpitations, focal neurological deficits, intermittent claudication, lower extremity edema, unexplained weight gain, cough, hemoptysis or wheezing.  The patient also denies abdominal pain, nausea, vomiting, dysphagia, diarrhea, constipation, polyuria, polydipsia, dysuria, hematuria, frequency, urgency, abnormal bleeding or bruising, fever, chills, unexpected weight changes, mood swings, change in skin or hair texture, change in voice quality, auditory or visual problems, allergic reactions or rashes, new musculoskeletal complaints other than usual "aches and pains".   PHYSICAL EXAM BP 140/70  Pulse 60  Resp 16  Ht 5' 9.75" (1.772 m)  Wt 90.81 kg (200 lb 3.2 oz)  BMI 28.92 kg/m2  General: Alert, oriented x3, no distress Head: no evidence of trauma, PERRL, EOMI, no exophtalmos or lid lag, no myxedema, no xanthelasma; normal ears, nose and oropharynxNeck: normal jugular venous pulsations and no hepatojugular reflux; brisk carotid pulses without delay and no carotid bruits Chest:  clear to auscultation, no signs of consolidation by percussion or palpation, normal fremitus, symmetrical and full respiratory , left subclavian pacemaker site appears healthy Cardiovascular: normal position and quality of the apical impulse, regular rhythm, normal first and second heart sounds, no murmurs, rubs or gallops Abdomen: no tenderness or distention, no masses by palpation, no abnormal pulsatility or arterial bruits, normal bowel sounds, no hepatosplenomegaly Extremities: no clubbing, cyanosis or edema; 2+ radial, ulnar and brachial pulses bilaterally; 2+ right femoral, posterior tibial and dorsalis pedis  pulses; 2+ left femoral, posterior tibial and dorsalis pedis pulses; no subclavian or femoral bruits Neurological: grossly nonfocal   EKG: Atrial paced, ventricular sensed, incomplete right bundle branch block, no repolarization abnormalities  Lipid Panel March 2014 total cholesterol 161, trig was read 59, HDL 53, LDL 96 Creatinine 0.7   ASSESSMENT AND PLAN  Mr. Mclean continues to feel unwell. Until a few months ago he was physically very active and had no complaints of dyspnea or chest discomfort. His symptoms are now atypical but persistent. They're worsened by atrial tachyarrhythmias but are present even when he is in normal sinus rhythm. He has a history of previously placed stents in the LAD artery and right coronary artery. His nuclear stress test 6 months ago was mildly abnormal with an interval defect that was felt to be secondary to attenuation artifact. Symptoms persist despite beta blocker and calcium channel blocker therapy. His symptoms may indeed be an equivalent of angina and since he has a history of multivessel disease, I don't think can rely solely on the nuclear scan. I have recommended that he have cardiac catheterization and coronary angiography. He is on full dose warfarin anticoagulation which will have to be stopped. It would be preferable to perform his angiogram  via a radial approach.  This procedure has been fully reviewed with the patient and informed consent has been obtained.   Orders Placed This Encounter  Procedures  . CBC  . APTT  . Protime-INR  . Comprehensive metabolic panel  . Implantable device check  . EKG 12-Lead  . LEFT HEART CATHETERIZATION WITH CORONARY ANGIOGRAM   Meds ordered this encounter  Medications  . lisinopril (PRINIVIL,ZESTRIL) 5 MG tablet    Sig: Take 10 mg by mouth daily. One capsule by mouth once daily  . temazepam (RESTORIL) 30 MG capsule    Sig: Take 30 mg by mouth at bedtime.  . nebivolol (BYSTOLIC) 5 MG tablet    Sig: Take 1 tablet (5 mg total) by mouth daily.    Dispense:  35 tablet    Refill:  0    INV # [id number] EXP: 1/17    Holli Humbles, MD, [facility] [phone] office [phone] pager

## 2013-07-01 NOTE — Progress Notes (Signed)
Cath Lab Visit (complete for each Cath Lab visit)  Clinical Evaluation Leading to the Procedure:   ACS: no  Non-ACS:    Anginal Classification: CCS II  Anti-ischemic medical therapy: Maximal Therapy (2 or more classes of medications)  Non-Invasive Test Results: Low-risk stress test findings: cardiac mortality <1%/year  Prior CABG: No previous CABG

## 2013-07-01 NOTE — Interval H&P Note (Signed)
History and Physical Interval Note:  07/01/2013 3:34 PM  Steve Norris  has presented today for surgery, with the diagnosis of Chest pain  The various methods of treatment have been discussed with the patient and family. After consideration of risks, benefits and other options for treatment, the patient has consented to  Procedure(s): LEFT HEART CATHETERIZATION WITH CORONARY ANGIOGRAM (N/A) as a surgical intervention .  The patient's history has been reviewed, patient examined, no change in status, stable for surgery.  I have reviewed the patient's chart and labs.  Questions were answered to the patient's satisfaction.     [name]

## 2013-07-01 NOTE — Op Note (Signed)
CARDIAC CATHETERIZATION REPORT   Procedures performed:  1. Left heart catheterization  2. Selective coronary angiography  3. Left ventriculography   Reason for procedure:  Stable angina pectoris Procedure performed by: Sanda Klein, MD, [facility]  Complications: none   Estimated blood loss: less than 5 mL   History:  Mr. Steve Norris continues to feel unwell. He has a strange discomfort in the left chest that increases with only minimal activity. He feels very weak at times and is frequently dizzy. He has mild shortness of breath. He states that his symptoms are quite reminiscent of his presentation with coronary disease in the past. His presentation was with syncope fatigue and shortness of breath on exertion. He had drug-eluting stents in the LAD artery and right coronary artery in 2006 . Repeat cardiac catheterization performed in 2010 and showed the stents to be patent. He has paroxysmal atrial fibrillation , recurrent ectopic atrial tachycardia and sinus node dysfunction with tachycardia-bradycardia syndrome and received a dual chamber St. Jude pacemaker in March 2011. Recently the burden of atrial fibrillation has been 6%, similar to his historical trend. He had a lengthy episode of atrial fibrillation in late January and during that time his clinical complaints were worse. He felt weaker and dizzy her and more short of breath than usual. However his symptoms persist even when he is back in normal rhythm.  A nuclear perfusion study performed last September showed low risk findings with what was interpreted as being an area of inferior wall attenuation artifact. LVEF 58%.   Consent: The risks, benefits, and details of the procedure were explained to the patient. Risks including death, MI, stroke, bleeding, limb ischemia, renal failure and allergy were described and accepted by the patient. Informed written consent was obtained prior to proceeding.  Technique: The patient was brought to the cardiac  catheterization laboratory in the fasting state. He was prepped and draped in the usual sterile fashion. Local anesthesia with 1% lidocaine was administered to the right wrist area. Using the modified Seldinger technique a 5 French right radial artery sheath was introduced without difficulty. Under fluoroscopic guidance, using 5 [place] JL4, JR and angled pigtail catheters, selective cannulation of the left coronary artery, right coronary artery and left ventricle were respectively performed. Several coronary angiograms in a variety of projections were recorded, as well as a left ventriculogram in the RAO projection. Left ventricular pressure and a pull back to the aorta were recorded. No immediate complications occurred. At the end of the procedure, all catheters were removed. After the procedure, hemostasis will be achieved with manual pressure.  Contrast used: 50 mL Omnipaque  Angiographic Findings:  1. The left main coronary artery is short and free of significant atherosclerosis and bifurcates in the usual fashion into the left anterior descending artery and left circumflex coronary artery.  2. The left anterior descending artery is a large vessel that reaches the apex and generates two medium size diagonal branches. There is evidence of mild luminal irregularities and mild calcification. No hemodynamically meaningful stenoses are seen. The previously placed proximal LAD Cypher stent is widely patent with minimal restenosis. 3. The left circumflex coronary artery is a large-size vessel non dominant vessel that generates two major oblique marginal arteries. These have ostia that are very close to each other and the proximal OM is larger There is evidence of mild luminal irregularities and mild calcification. No hemodynamically meaningful stenoses are seen. 4. The right coronary artery is a large-size dominant vessel that generates a long posterior  lateral ventricular system as well as the PDA. There is  evidence of extensive, mild to moderate luminal irregularities and mild calcification. The proximal RCA Cypher stent is widely patent with approximately 30% in stent restenosis. No hemodynamically meaningful stenoses are seen.  5. The left ventricle is normal in size. The left ventricle systolic function is normal with an estimated ejection fraction of 55%. Regional wall motion abnormalities are not seen. No left ventricular thrombus is seen. There is no obvious mitral insufficiency. The ascending aorta appears normal. There is no aortic valve stenosis by pullback. The left ventricular end-diastolic pressure is 19 mm Hg.    IMPRESSIONS:  Mild coronary atherosclerosis. Old stents in LAD and RCA are widely patent.   RECOMMENDATION:  Continued medical therapy.    Sanda Klein, MD, [facility] [phone] office [phone] pager

## 2013-07-01 NOTE — Progress Notes (Signed)
Pt received form cath procedure alert and denies any discomfort at this time.

## 2013-07-01 NOTE — Discharge Instructions (Signed)
Radial Site Care Refer to this sheet in the next few weeks. These instructions provide you with information on caring for yourself after your procedure. Your caregiver may also give you more specific instructions. Your treatment has been planned according to current medical practices, but problems sometimes occur. Call your caregiver if you have any problems or questions after your procedure. HOME CARE INSTRUCTIONS  You may shower the day after the procedure.Remove the bandage (dressing) and gently wash the site with plain soap and water.Gently pat the site dry.  Do not apply powder or lotion to the site.  Do not submerge the affected site in water for 3 to 5 days.  Inspect the site at least twice daily.  Do not flex or bend the affected arm for 24 hours.  No lifting over 5 pounds (2.3 kg) for 5 days after your procedure.  Do not drive home if you are discharged the same day of the procedure. Have someone else drive you.  You may drive 24 hours after the procedure unless otherwise instructed by your caregiver.  Do not operate machinery or power tools for 24 hours.  A responsible adult should be with you for the first 24 hours after you arrive home. What to expect:  Any bruising will usually fade within 1 to 2 weeks.  Blood that collects in the tissue (hematoma) may be painful to the touch. It should usually decrease in size and tenderness within 1 to 2 weeks. SEEK IMMEDIATE MEDICAL CARE IF:  You have unusual pain at the radial site.  You have redness, warmth, swelling, or pain at the radial site.  You have drainage (other than a small amount of blood on the dressing).  You have chills.  You have a fever or persistent symptoms for more than 72 hours.  You have a fever and your symptoms suddenly get worse.  Your arm becomes pale, cool, tingly, or numb.  You have heavy bleeding from the site. Hold pressure on the site.  Call lif bleeding is not under control Document  Released: 05/17/2010 Document Revised: 07/07/2011 Document Reviewed: 05/17/2010 [facility] Patient Information 2014 [place].

## 2013-07-01 NOTE — Progress Notes (Signed)
Discharge instruction given per MD order.  Pt and CG able to verbalize understanding.  Pt to car via wheelchair. 

## 2013-07-05 ENCOUNTER — Telehealth: Payer: Self-pay | Admitting: *Deleted

## 2013-07-05 ENCOUNTER — Ambulatory Visit: Payer: Medicare HMO | Admitting: *Deleted

## 2013-07-05 ENCOUNTER — Encounter: Payer: Commercial Managed Care - HMO | Admitting: *Deleted

## 2013-07-05 ENCOUNTER — Ambulatory Visit (INDEPENDENT_AMBULATORY_CARE_PROVIDER_SITE_OTHER): Payer: Commercial Managed Care - HMO | Admitting: *Deleted

## 2013-07-05 ENCOUNTER — Ambulatory Visit: Payer: Medicare HMO

## 2013-07-05 VITALS — BP 112/68 | HR 68

## 2013-07-05 DIAGNOSIS — I4891 Unspecified atrial fibrillation: Secondary | ICD-10-CM

## 2013-07-05 DIAGNOSIS — Z7901 Long term (current) use of anticoagulants: Secondary | ICD-10-CM

## 2013-07-05 LAB — POCT INR: INR: 1.6

## 2013-07-05 NOTE — Progress Notes (Signed)
This encounter was created in error - please disregard.

## 2013-07-05 NOTE — Telephone Encounter (Signed)
Message copied by Chauncy Lean on Tue Jul 05, 2013 12:52 PM ------      Message from: Rockne Menghini.      Created: Tue Jul 05, 2013 11:00 AM       Thx.  Have him take extra 1/2 tablet today and tomorrow, then resume previous dose.  Eliezer Lofts should be able to record that.            Erasmo Downer       ----- Message -----         From: Chauncy Lean, RN         Sent: 07/05/2013  10:50 AM           To: Tommy Medal, RPH-CPP            Mr Fontanilla walked in for an INR.  It was 1.6.  BP was 112/68 and HR 68.      He has been taking 5mg  daily since last Friday after his cath.                    Thanks!         ------

## 2013-07-05 NOTE — Telephone Encounter (Signed)
Returning your call. °

## 2013-07-05 NOTE — Telephone Encounter (Signed)
Documented. Curt Bears, RN to notify patient.

## 2013-07-05 NOTE — Telephone Encounter (Signed)
I spoke with patient and reviewed instructions. I schedule f/u appt.

## 2013-07-19 NOTE — Progress Notes (Signed)
This encounter was created in error - please disregard.

## 2013-07-27 ENCOUNTER — Ambulatory Visit (INDEPENDENT_AMBULATORY_CARE_PROVIDER_SITE_OTHER): Payer: Commercial Managed Care - HMO | Admitting: Pharmacist Clinician (PhC)/ Clinical Pharmacy Specialist

## 2013-07-27 DIAGNOSIS — I4891 Unspecified atrial fibrillation: Secondary | ICD-10-CM

## 2013-07-27 DIAGNOSIS — Z7901 Long term (current) use of anticoagulants: Secondary | ICD-10-CM

## 2013-07-27 LAB — POCT INR: INR: 2.8

## 2013-08-24 ENCOUNTER — Ambulatory Visit (INDEPENDENT_AMBULATORY_CARE_PROVIDER_SITE_OTHER): Payer: Medicare HMO | Admitting: Pharmacist Clinician (PhC)/ Clinical Pharmacy Specialist

## 2013-08-24 DIAGNOSIS — Z7901 Long term (current) use of anticoagulants: Secondary | ICD-10-CM

## 2013-08-24 DIAGNOSIS — I4891 Unspecified atrial fibrillation: Secondary | ICD-10-CM

## 2013-08-24 LAB — POCT INR: INR: 2.4

## 2013-09-08 ENCOUNTER — Ambulatory Visit (INDEPENDENT_AMBULATORY_CARE_PROVIDER_SITE_OTHER): Payer: Commercial Managed Care - HMO | Admitting: *Deleted

## 2013-09-08 DIAGNOSIS — I4891 Unspecified atrial fibrillation: Secondary | ICD-10-CM

## 2013-09-21 ENCOUNTER — Ambulatory Visit: Payer: Medicare HMO | Admitting: Pharmacist Clinician (PhC)/ Clinical Pharmacy Specialist

## 2013-09-23 ENCOUNTER — Ambulatory Visit (INDEPENDENT_AMBULATORY_CARE_PROVIDER_SITE_OTHER): Payer: Commercial Managed Care - HMO | Admitting: Pharmacist Clinician (PhC)/ Clinical Pharmacy Specialist

## 2013-09-23 DIAGNOSIS — Z5181 Encounter for therapeutic drug level monitoring: Secondary | ICD-10-CM | POA: Diagnosis not present

## 2013-09-23 DIAGNOSIS — I4891 Unspecified atrial fibrillation: Secondary | ICD-10-CM | POA: Diagnosis not present

## 2013-09-23 DIAGNOSIS — Z7901 Long term (current) use of anticoagulants: Secondary | ICD-10-CM

## 2013-09-23 LAB — POCT INR: INR: 2.1

## 2013-09-28 ENCOUNTER — Telehealth: Payer: Self-pay | Admitting: Cardiovascular Disease

## 2013-09-28 NOTE — Telephone Encounter (Signed)
Informed patient that remote was not received. I asked pt to resend.

## 2013-09-28 NOTE — Telephone Encounter (Signed)
Is going to transmitt his reading  For his pacemaker and wants to be sure that youi read it , because he had a pretty serious episode this morning .Marland Kitchen Please call    Thanks

## 2013-09-28 NOTE — Telephone Encounter (Signed)
Informed pt that remote had not been received. 800# given for further trouble shooting.

## 2013-09-30 NOTE — Telephone Encounter (Signed)
Informed patient of brief episode. Will inform [facility] of episode.

## 2013-10-31 ENCOUNTER — Encounter: Payer: Self-pay | Admitting: Cardiovascular Disease

## 2013-10-31 ENCOUNTER — Ambulatory Visit (INDEPENDENT_AMBULATORY_CARE_PROVIDER_SITE_OTHER): Payer: Commercial Managed Care - HMO | Admitting: Pharmacist

## 2013-10-31 DIAGNOSIS — I4891 Unspecified atrial fibrillation: Secondary | ICD-10-CM

## 2013-10-31 DIAGNOSIS — Z7901 Long term (current) use of anticoagulants: Secondary | ICD-10-CM

## 2013-10-31 LAB — MDC_IDC_ENUM_SESS_TYPE_REMOTE
Battery Voltage: 2.95 V
Implantable Pulse Generator Model: 2210
Lead Channel Impedance Value: 510 Ohm
Lead Channel Sensing Intrinsic Amplitude: 2.6 mV
Lead Channel Sensing Intrinsic Amplitude: 510 mV
Lead Channel Setting Pacing Amplitude: 2.5 V
Lead Channel Setting Pacing Pulse Width: 0.4 ms
MDC IDC MSMT LEADCHNL RA IMPEDANCE VALUE: 360 Ohm
MDC IDC PG SERIAL: 7124006
MDC IDC SET LEADCHNL RA PACING AMPLITUDE: 2 V
MDC IDC SET LEADCHNL RV SENSING SENSITIVITY: 2 mV

## 2013-10-31 LAB — POCT INR: INR: 1.9

## 2013-11-09 ENCOUNTER — Encounter: Payer: Self-pay | Admitting: Cardiology

## 2013-12-12 ENCOUNTER — Ambulatory Visit (INDEPENDENT_AMBULATORY_CARE_PROVIDER_SITE_OTHER): Payer: Commercial Managed Care - HMO | Admitting: Pharmacist Clinician (PhC)/ Clinical Pharmacy Specialist

## 2013-12-12 DIAGNOSIS — Z7901 Long term (current) use of anticoagulants: Secondary | ICD-10-CM

## 2013-12-12 DIAGNOSIS — I4891 Unspecified atrial fibrillation: Secondary | ICD-10-CM

## 2013-12-12 LAB — POCT INR: INR: 2.2

## 2014-01-03 ENCOUNTER — Ambulatory Visit (INDEPENDENT_AMBULATORY_CARE_PROVIDER_SITE_OTHER): Payer: Commercial Managed Care - HMO | Admitting: Cardiovascular Disease

## 2014-01-03 ENCOUNTER — Encounter: Payer: Self-pay | Admitting: Cardiovascular Disease

## 2014-01-03 VITALS — BP 138/72 | HR 60 | Resp 16 | Ht 70.0 in | Wt 191.8 lb

## 2014-01-03 DIAGNOSIS — I2510 Atherosclerotic heart disease of native coronary artery without angina pectoris: Secondary | ICD-10-CM

## 2014-01-03 DIAGNOSIS — I4891 Unspecified atrial fibrillation: Secondary | ICD-10-CM

## 2014-01-03 DIAGNOSIS — Z950 Presence of cardiac pacemaker: Secondary | ICD-10-CM

## 2014-01-03 NOTE — Patient Instructions (Signed)
Remote monitoring is used to monitor your Pacemaker or ICD from home. This monitoring reduces the number of office visits required to check your device to one time per year. It allows [place] to monitor the functioning of your device to ensure it is working properly. You are scheduled for a device check from home on 04/06/2014. You may send your transmission at any time that day. If you have a wireless device, the transmission will be sent automatically. After your physician reviews your transmission, you will receive a postcard with your next transmission date.  Dr. Sallyanne Kuster recommends that you schedule a follow-up appointment in: 6 months with device check.

## 2014-01-03 NOTE — Progress Notes (Signed)
Patient ID: Steve Norris, male   DOB: 16-Nov-1929, 80 y.o.   MRN: [id number]      Reason for office visit Paroxysmal atrial fibrillation, pacemaker follow-up  Steve Norris has no cardiac complaints. He continues to have trouble with sleeplessness, fatigue and is very worried about his wife's health problems.   He had drug-eluting stents in the LAD artery and right coronary artery in 2006 . Repeat cardiac catheterization performed in 2010 and and again in March 2015 showed the stents to be patent. He has paroxysmal atrial fibrillation , recurrent ectopic atrial tachycardia and sinus node dysfunction with tachycardia-bradycardia syndrome and received a dual chamber St. Jude pacemaker in March 2011. Recently the burden of atrial fibrillation has been 1.5%, a reduction compared to earlier this year. He had a lengthy episode of atrial fibrillation in late January and during that time his clinical complaints were worse. He felt weaker and dizzier and more short of breath than usual. However his symptoms persist even when he is back in normal rhythm. He has never had angina. When he presented with CAD his major complaint was dyspnea.  Allergies  Allergen Reactions  . Penicillins     REACTION: rash/hives/swelling  . Simvastatin Other (See Comments)    unknown    Current Outpatient Prescriptions  Medication Sig Dispense Refill  . acetaminophen (TYLENOL) 500 MG tablet Take 500 mg by mouth daily as needed for moderate pain.      Marland Kitchen aspirin 81 MG tablet Take 81 mg by mouth daily.      Marland Kitchen b complex vitamins capsule Take 1 capsule by mouth daily.      Marland Kitchen diltiazem (CARDIZEM CD) 120 MG 24 hr capsule Take 1 capsule (120 mg total) by mouth daily.  90 capsule  2  . esomeprazole (NEXIUM) 20 MG capsule Take 40 mg by mouth every morning.       . finasteride (PROSCAR) 5 MG tablet Take 5 mg by mouth daily.       . furosemide (LASIX) 20 MG tablet Take 20 mg by mouth daily as needed for fluid.      Marland Kitchen lisinopril  (PRINIVIL,ZESTRIL) 10 MG tablet Take 5 mg by mouth daily.       . nebivolol (BYSTOLIC) 5 MG tablet Take 1 tablet (5 mg total) by mouth daily.  30 tablet  5  . nitroGLYCERIN (NITROSTAT) 0.4 MG SL tablet Place 0.4 mg under the tongue every 5 (five) minutes as needed for chest pain.       . Omega-3 Fatty Acids (FISH OIL) 1000 MG CAPS Take 2,000 capsules by mouth daily.       . rosuvastatin (CRESTOR) 5 MG tablet Take 5 mg by mouth 3 (three) times a week. On Monday, Wednesday, and Friday.      . SENNA PO Take 1 tablet by mouth at bedtime.      . SYMBICORT 160-4.5 MCG/ACT inhaler Inhale 1 puff into the lungs 2 (two) times daily.       . temazepam (RESTORIL) 30 MG capsule Take 30 mg by mouth at bedtime.      . vitamin C (ASCORBIC ACID) 500 MG tablet Take 1,000 mg by mouth daily.       . vitamin E 400 UNIT capsule Take 800 Units by mouth daily.       Marland Kitchen warfarin (COUMADIN) 5 MG tablet Take 2.5-5 mg by mouth daily. Take 2.75m on Wednesday and Saturday. All other days take 541m  No current facility-administered medications for this visit.    Past Medical History  Diagnosis Date  . Hiatal hernia 03-19-2009    EGD  . Erosive esophagitis 03-19-2009    EGD  . Barrett's esophagus 03-19-2009    EGD  . CAD (coronary artery disease)   . Paroxysmal atrial fibrillation   . Internal hemorrhoids 03-19-2009    Colonoscopy  . Diverticulosis 03-19-2009    Colonoscopy   . Arthritis   . Asthma   . Cataract     REMOVED  . COPD (chronic obstructive pulmonary disease)   . GERD (gastroesophageal reflux disease)   . Hyperlipidemia   . Hypertension     Past Surgical History  Procedure Laterality Date  . Vasectomy    . Hemorrhoid surgery    . Tonsillectomy    . Coronary stent placement  2006    Right Coronary Artery Cypher stents placed 2006  . Cataract extraction    . Colonoscopy    . Pacemaker insertion  07/25/2009    St.Jude Accent  . Cardiac catheterization  02/12/2009    mild ostial left  main disease,patent LAD & RCA stents   . US echocardiography  07/12/2009    borderline LA enlargement,mild mitral annular ca+, AOV mildly sclerotic,trace AI.  Marland Kitchen Nm myocar perf wall motion  08/14/2008    no significant ischemia EF 64%    Family History  Problem Relation Age of Onset  . Colon cancer Neg Hx   . Heart disease Mother   . Heart disease Father     History   Social History  . Marital Status: Married    Spouse Name: N/A    Number of Children: N/A  . Years of Education: N/A   Occupational History  . Retired     Social History Main Topics  . Smoking status: Former Research scientist (life sciences)  . Smokeless tobacco: Never Used     Comment: Quit smoking 1996  . Alcohol Use: No  . Drug Use: No  . Sexual Activity: Not on file   Other Topics Concern  . Not on file   Social History Narrative   Daily caffeine     Review of systems: The patient specifically denies any chest pain at rest or with exertion, dyspnea at rest or with exertion, orthopnea, paroxysmal nocturnal dyspnea, syncope, palpitations, focal neurological deficits, intermittent claudication, lower extremity edema, unexplained weight gain, cough, hemoptysis or wheezing.  The patient also denies abdominal pain, nausea, vomiting, dysphagia, diarrhea, constipation, polyuria, polydipsia, dysuria, hematuria, frequency, urgency, abnormal bleeding or bruising, fever, chills, unexpected weight changes, mood swings, change in skin or hair texture, change in voice quality, auditory or visual problems, allergic reactions or rashes, new musculoskeletal complaints other than usual "aches and pains".   PHYSICAL EXAM BP 138/72  Pulse 60  Resp 16  Ht 5' 10"  (1.778 m)  Wt 191 lb 12.8 oz (87 kg)  BMI 27.52 kg/m2  General: Alert, oriented x3, no distress Head: no evidence of trauma, PERRL, EOMI, no exophtalmos or lid lag, no myxedema, no xanthelasma; normal ears, nose and oropharynx Neck: normal jugular venous pulsations and no hepatojugular  reflux; brisk carotid pulses without delay and no carotid bruits Chest: clear to auscultation, no signs of consolidation by percussion or palpation, normal fremitus, symmetrical and full respiratory excursions, healthy left subclavian pacemaker site Cardiovascular: normal position and quality of the apical impulse, regular rhythm, normal first and second heart sounds, no murmurs, rubs or gallops Abdomen: no tenderness or distention, no masses by  palpation, no abnormal pulsatility or arterial bruits, normal bowel sounds, no hepatosplenomegaly Extremities: no clubbing, cyanosis or edema; 2+ radial, ulnar and brachial pulses bilaterally; 2+ right femoral, posterior tibial and dorsalis pedis pulses; 2+ left femoral, posterior tibial and dorsalis pedis pulses; no subclavian or femoral bruits Neurological: grossly nonfocal   EKG: Atrial paced ventricular sensed otherwise normal  March 2014 total cholesterol 161, trig was read 59, HDL 53, LDL 96  BMET    Component Value Date/Time   NA 139 06/27/2013 1210   K 4.5 06/27/2013 1210   CL 103 06/27/2013 1210   CO2 28 06/27/2013 1210   GLUCOSE 84 06/27/2013 1210   BUN 12 06/27/2013 1210   CREATININE 0.62 06/27/2013 1210   CREATININE 0.71 03/08/2009 1235   CALCIUM 8.9 06/27/2013 1210   GFRNONAA >60 03/08/2009 1235   GFRAA  Value: >60        The eGFR has been calculated using the MDRD equation. This calculation has not been validated in all clinical situations. eGFR's persistently <60 mL/min signify possible Chronic Kidney Disease. 03/08/2009 1235     ASSESSMENT AND PLAN  Paroxysmal atrial fibrillation For the last 7 months the burden has been only 1.5%, with the longest episode fasting for about 5 hours in mid-August. She does not appear to be aware of the palpitations. Ventricular rate control is adequate. He is on appropriate anticoagulation without bleeding problems.  CAD status post stents to LAD and RCA He has never had angina pectoris. Cardiac  catheterization in March of this year showed no evidence of coronary lesions, the old stents widely patent.  Permanent pacemaker Normal device function. No permanent reprogramming changes. Continuing with followup via the [facility] system.  Orders Placed This Encounter  Procedures  . EKG 12-Lead   No orders of the defined types were placed in this encounter.    Holli Humbles, MD, [place] [phone] office [phone] pager

## 2014-01-04 LAB — MDC_IDC_ENUM_SESS_TYPE_INCLINIC
Battery Voltage: 2.95 V
Brady Statistic RA Percent Paced: 97 %
Brady Statistic RV Percent Paced: 4.5 %
Date Time Interrogation Session: 20150908145704
Implantable Pulse Generator Model: 2210
Implantable Pulse Generator Serial Number: 7124006
Lead Channel Impedance Value: 487.5 Ohm
Lead Channel Pacing Threshold Amplitude: 0.5 V
Lead Channel Pacing Threshold Amplitude: 0.75 V
Lead Channel Pacing Threshold Pulse Width: 0.4 ms
Lead Channel Pacing Threshold Pulse Width: 0.4 ms
Lead Channel Sensing Intrinsic Amplitude: 11.5 mV
Lead Channel Setting Pacing Amplitude: 2 V
Lead Channel Setting Sensing Sensitivity: 2 mV
MDC IDC MSMT BATTERY REMAINING LONGEVITY: 103.2 mo
MDC IDC MSMT LEADCHNL RA IMPEDANCE VALUE: 362.5 Ohm
MDC IDC MSMT LEADCHNL RA PACING THRESHOLD AMPLITUDE: 0.5 V
MDC IDC MSMT LEADCHNL RA PACING THRESHOLD PULSEWIDTH: 0.4 ms
MDC IDC MSMT LEADCHNL RA SENSING INTR AMPL: 2 mV
MDC IDC MSMT LEADCHNL RV PACING THRESHOLD AMPLITUDE: 0.75 V
MDC IDC MSMT LEADCHNL RV PACING THRESHOLD PULSEWIDTH: 0.4 ms
MDC IDC SET LEADCHNL RV PACING AMPLITUDE: 2.5 V
MDC IDC SET LEADCHNL RV PACING PULSEWIDTH: 0.4 ms

## 2014-01-23 ENCOUNTER — Ambulatory Visit (INDEPENDENT_AMBULATORY_CARE_PROVIDER_SITE_OTHER): Payer: Commercial Managed Care - HMO | Admitting: Pharmacist Clinician (PhC)/ Clinical Pharmacy Specialist

## 2014-01-23 DIAGNOSIS — Z7901 Long term (current) use of anticoagulants: Secondary | ICD-10-CM

## 2014-01-23 DIAGNOSIS — I4891 Unspecified atrial fibrillation: Secondary | ICD-10-CM

## 2014-01-23 LAB — POCT INR: INR: 2.5

## 2014-01-25 ENCOUNTER — Encounter: Payer: Self-pay | Admitting: Cardiology

## 2014-02-01 ENCOUNTER — Other Ambulatory Visit: Payer: Self-pay | Admitting: *Deleted

## 2014-02-01 ENCOUNTER — Other Ambulatory Visit: Payer: Self-pay | Admitting: Cardiovascular Disease

## 2014-02-01 MED ORDER — NEBIVOLOL HCL 5 MG PO TABS: 5.0000 mg | ORAL_TABLET | Freq: Every day | ORAL | Status: DC

## 2014-02-01 NOTE — Telephone Encounter (Signed)
Rx was sent to pharmacy electronically. 

## 2014-02-09 ENCOUNTER — Encounter: Payer: Self-pay | Admitting: Gastroenterology

## 2014-03-06 ENCOUNTER — Encounter: Payer: Self-pay | Admitting: Cardiovascular Disease

## 2014-03-06 ENCOUNTER — Ambulatory Visit (INDEPENDENT_AMBULATORY_CARE_PROVIDER_SITE_OTHER): Payer: Commercial Managed Care - HMO | Admitting: Pharmacist Clinician (PhC)/ Clinical Pharmacy Specialist

## 2014-03-06 DIAGNOSIS — I4891 Unspecified atrial fibrillation: Secondary | ICD-10-CM

## 2014-03-06 DIAGNOSIS — Z7901 Long term (current) use of anticoagulants: Secondary | ICD-10-CM

## 2014-03-06 LAB — POCT INR: INR: 1.9

## 2014-03-13 ENCOUNTER — Telehealth: Payer: Self-pay | Admitting: Cardiovascular Disease

## 2014-03-13 NOTE — Telephone Encounter (Signed)
Transmission does show Afib. Pt taking warfarin as instructed. Pt concerned he is experiencing arrhythmia with heart pounding sensations w/ rates up to 123bpm. Pt understands his device will not create a recording unless rate surpasses 150bpm. Pt has been awoken by his heart pounding. He uses BP machine to measure his heart rate during symptoms w/ pulse ranging from 87-123bpm. Pt does have mild SOB during fast rates. Pt understands his pacemaker cannot stop afib. Follow up as planned.

## 2014-03-13 NOTE — Telephone Encounter (Signed)
Mr.Gault is calling because he wants to transmit because he says he thinks he was in afib. He wants to know what was said on his transmission . Please call

## 2014-04-06 ENCOUNTER — Encounter (HOSPITAL_COMMUNITY): Payer: Self-pay | Admitting: Cardiovascular Disease

## 2014-04-06 ENCOUNTER — Ambulatory Visit (INDEPENDENT_AMBULATORY_CARE_PROVIDER_SITE_OTHER): Payer: Commercial Managed Care - HMO | Admitting: *Deleted

## 2014-04-06 DIAGNOSIS — I480 Paroxysmal atrial fibrillation: Secondary | ICD-10-CM

## 2014-04-06 NOTE — Progress Notes (Signed)
Remote pacemaker transmission.   

## 2014-04-17 ENCOUNTER — Ambulatory Visit (INDEPENDENT_AMBULATORY_CARE_PROVIDER_SITE_OTHER): Payer: Commercial Managed Care - HMO | Admitting: Pharmacist Clinician (PhC)/ Clinical Pharmacy Specialist

## 2014-04-17 DIAGNOSIS — Z7901 Long term (current) use of anticoagulants: Secondary | ICD-10-CM

## 2014-04-17 DIAGNOSIS — I4891 Unspecified atrial fibrillation: Secondary | ICD-10-CM

## 2014-04-17 LAB — POCT INR: INR: 2.8

## 2014-04-18 LAB — MDC_IDC_ENUM_SESS_TYPE_REMOTE
Battery Remaining Longevity: 83 mo
Brady Statistic AP VP Percent: 5.5 %
Brady Statistic AS VS Percent: 1 %
Date Time Interrogation Session: 20151210072950
Implantable Pulse Generator Model: 2210
Implantable Pulse Generator Serial Number: 7124006
Lead Channel Impedance Value: 360 Ohm
Lead Channel Impedance Value: 490 Ohm
Lead Channel Setting Pacing Amplitude: 2 V
Lead Channel Setting Pacing Amplitude: 2.5 V
Lead Channel Setting Pacing Pulse Width: 0.4 ms
Lead Channel Setting Sensing Sensitivity: 2 mV
MDC IDC MSMT BATTERY REMAINING PERCENTAGE: 74 %
MDC IDC MSMT BATTERY VOLTAGE: 2.95 V
MDC IDC MSMT LEADCHNL RA SENSING INTR AMPL: 2.3 mV
MDC IDC MSMT LEADCHNL RV SENSING INTR AMPL: 11.8 mV
MDC IDC STAT BRADY AP VS PERCENT: 93 %
MDC IDC STAT BRADY AS VP PERCENT: 1 %
MDC IDC STAT BRADY RA PERCENT PACED: 96 %
MDC IDC STAT BRADY RV PERCENT PACED: 5.9 %

## 2014-05-05 ENCOUNTER — Encounter: Payer: Self-pay | Admitting: Cardiovascular Disease

## 2014-05-29 ENCOUNTER — Ambulatory Visit (INDEPENDENT_AMBULATORY_CARE_PROVIDER_SITE_OTHER): Payer: PPO | Admitting: Pharmacist Clinician (PhC)/ Clinical Pharmacy Specialist

## 2014-05-29 DIAGNOSIS — Z7901 Long term (current) use of anticoagulants: Secondary | ICD-10-CM

## 2014-05-29 DIAGNOSIS — I4891 Unspecified atrial fibrillation: Secondary | ICD-10-CM

## 2014-05-29 LAB — POCT INR: INR: 2

## 2014-05-31 ENCOUNTER — Telehealth: Payer: Self-pay | Admitting: Cardiovascular Disease

## 2014-05-31 MED ORDER — LISINOPRIL 5 MG PO TABS: 5.0000 mg | ORAL_TABLET | Freq: Every day | ORAL | Status: DC

## 2014-05-31 NOTE — Telephone Encounter (Signed)
Rx refilled.

## 2014-05-31 NOTE — Telephone Encounter (Signed)
Pt need new prescription for Lisinopril 5 mg #90 and refills. Please call to [place].

## 2014-06-08 ENCOUNTER — Other Ambulatory Visit: Payer: Self-pay | Admitting: *Deleted

## 2014-06-08 MED ORDER — DILTIAZEM HCL ER COATED BEADS 120 MG PO CP24: 120.0000 mg | ORAL_CAPSULE | Freq: Every day | ORAL | Status: DC

## 2014-06-08 NOTE — Telephone Encounter (Signed)
Rx(s) sent to pharmacy electronically.  

## 2014-07-04 ENCOUNTER — Ambulatory Visit (INDEPENDENT_AMBULATORY_CARE_PROVIDER_SITE_OTHER): Payer: PPO | Admitting: *Deleted

## 2014-07-04 ENCOUNTER — Encounter: Payer: Commercial Managed Care - HMO | Admitting: Cardiovascular Disease

## 2014-07-04 DIAGNOSIS — I4891 Unspecified atrial fibrillation: Secondary | ICD-10-CM

## 2014-07-04 DIAGNOSIS — Z7901 Long term (current) use of anticoagulants: Secondary | ICD-10-CM

## 2014-07-04 LAB — POCT INR: INR: 2.3

## 2014-08-10 ENCOUNTER — Other Ambulatory Visit: Payer: Self-pay | Admitting: Pharmacist Clinician (PhC)/ Clinical Pharmacy Specialist

## 2014-08-10 MED ORDER — WARFARIN SODIUM 5 MG PO TABS: ORAL_TABLET | ORAL | Status: DC

## 2014-08-16 ENCOUNTER — Ambulatory Visit: Payer: PPO | Admitting: Pharmacist Clinician (PhC)/ Clinical Pharmacy Specialist

## 2014-08-17 ENCOUNTER — Ambulatory Visit (INDEPENDENT_AMBULATORY_CARE_PROVIDER_SITE_OTHER): Payer: PPO | Admitting: Pharmacist Clinician (PhC)/ Clinical Pharmacy Specialist

## 2014-08-17 DIAGNOSIS — I480 Paroxysmal atrial fibrillation: Secondary | ICD-10-CM

## 2014-08-17 LAB — POCT INR: INR: 2.8

## 2014-09-12 ENCOUNTER — Ambulatory Visit (INDEPENDENT_AMBULATORY_CARE_PROVIDER_SITE_OTHER): Payer: PPO | Admitting: Cardiovascular Disease

## 2014-09-12 ENCOUNTER — Encounter: Payer: Self-pay | Admitting: Cardiovascular Disease

## 2014-09-12 ENCOUNTER — Ambulatory Visit (INDEPENDENT_AMBULATORY_CARE_PROVIDER_SITE_OTHER): Payer: PPO | Admitting: *Deleted

## 2014-09-12 VITALS — BP 114/68 | HR 80 | Ht 69.0 in | Wt 193.8 lb

## 2014-09-12 DIAGNOSIS — I4891 Unspecified atrial fibrillation: Secondary | ICD-10-CM | POA: Diagnosis not present

## 2014-09-12 DIAGNOSIS — Z7901 Long term (current) use of anticoagulants: Secondary | ICD-10-CM

## 2014-09-12 DIAGNOSIS — I480 Paroxysmal atrial fibrillation: Secondary | ICD-10-CM | POA: Diagnosis not present

## 2014-09-12 DIAGNOSIS — I2510 Atherosclerotic heart disease of native coronary artery without angina pectoris: Secondary | ICD-10-CM

## 2014-09-12 DIAGNOSIS — Z950 Presence of cardiac pacemaker: Secondary | ICD-10-CM | POA: Diagnosis not present

## 2014-09-12 LAB — POCT INR: INR: 2.4

## 2014-09-12 NOTE — Progress Notes (Signed)
Patient ID: Steve Norris, male   DOB: [birth year], 81 y.o.   MRN: [id number]     Cardiology Office Note   Date:  09/12/2014   ID:  Steve Norris, DOB 1929-06-27, MRN [id number]  PCP:  Tommy Medal, MD  Cardiologist:   Sanda Klein, MD   Chief Complaint  Patient presents with  . Follow-up    6 Months      History of Present Illness: Steve Norris is a 81 y.o. male who presents for Paroxysmal atrial fibrillation, pacemaker follow-up, CAD  Mr. Salehi has no cardiac complaints other than unchanged mild dyspnea, NYHA functional class II. He continues to be worried about his wife's health problems. She has gallstones and abnormal liver function tests and has recently been diagnosed with congestive heart failure.  He had drug-eluting stents in the LAD artery and right coronary artery in 2006 . Repeat cardiac catheterization performed in 2010 and and again in March 2015 showed the stents to be patent. He has paroxysmal atrial fibrillation , recurrent ectopic atrial tachycardia and sinus node dysfunction with tachycardia-bradycardia syndrome and received a dual chamber St. Jude pacemaker in March 2011. Recently the burden of atrial fibrillation has been stable at 1.5%. He had a lengthy episode of atrial fibrillation in March, but was unaware of it. He has never had angina. When he presented with CAD his major complaint was dyspnea. He underwent coronary angiography and March 2015 showing normal widely patent Cypher stents in the LAD and RCA, LVEF of 55%, LVEDP 19 mmHg.    Past Medical History  Diagnosis Date  . Hiatal hernia 03-19-2009    EGD  . Erosive esophagitis 03-19-2009    EGD  . Barrett's esophagus 03-19-2009    EGD  . CAD (coronary artery disease)   . Paroxysmal atrial fibrillation   . Internal hemorrhoids 03-19-2009    Colonoscopy  . Diverticulosis 03-19-2009    Colonoscopy   . Arthritis   . Asthma   . Cataract     REMOVED  . COPD (chronic obstructive pulmonary disease)     . GERD (gastroesophageal reflux disease)   . Hyperlipidemia   . Hypertension     Past Surgical History  Procedure Laterality Date  . Vasectomy    . Hemorrhoid surgery    . Tonsillectomy    . Coronary stent placement  2006    Right Coronary Artery Cypher stents placed 2006  . Cataract extraction    . Colonoscopy    . Pacemaker insertion  07/25/2009    St.Jude Accent  . Cardiac catheterization  02/12/2009    mild ostial left main disease,patent LAD & RCA stents   . US echocardiography  07/12/2009    borderline LA enlargement,mild mitral annular ca+, AOV mildly sclerotic,trace AI.  Marland Kitchen Nm myocar perf wall motion  08/14/2008    no significant ischemia EF 64%  . Left heart catheterization with coronary angiogram N/A 07/01/2013    Procedure: LEFT HEART CATHETERIZATION WITH CORONARY ANGIOGRAM;  Surgeon: Sanda Klein, MD;  Location: [facility];  Service: Cardiovascular;  Laterality: N/A;     Current Outpatient Prescriptions  Medication Sig Dispense Refill  . acetaminophen (TYLENOL) 500 MG tablet Take 500 mg by mouth daily as needed for moderate pain.    Marland Kitchen aspirin 81 MG tablet Take 81 mg by mouth daily.    Marland Kitchen b complex vitamins capsule Take 1 capsule by mouth daily.    Marland Kitchen diltiazem (CARTIA XT) 120 MG 24 hr capsule Take 1 capsule (120  mg total) by mouth daily. 90 capsule 2  . esomeprazole (NEXIUM) 20 MG capsule Take 40 mg by mouth every morning.     . finasteride (PROSCAR) 5 MG tablet Take 5 mg by mouth daily.     . furosemide (LASIX) 20 MG tablet Take 20 mg by mouth daily as needed for fluid.    Marland Kitchen lisinopril (PRINIVIL,ZESTRIL) 5 MG tablet Take 1 tablet (5 mg total) by mouth daily. 90 tablet 2  . nebivolol (BYSTOLIC) 5 MG tablet Take 1 tablet (5 mg total) by mouth daily. 30 tablet 10  . nitroGLYCERIN (NITROSTAT) 0.4 MG SL tablet Place 0.4 mg under the tongue every 5 (five) minutes as needed for chest pain.     . Omega-3 Fatty Acids (FISH OIL) 1000 MG CAPS Take 2,000 capsules by mouth daily.      . rosuvastatin (CRESTOR) 5 MG tablet Take 5 mg by mouth 3 (three) times a week. On Monday, Wednesday, and Friday.    . SENNA PO Take 1 tablet by mouth at bedtime.    . SYMBICORT 160-4.5 MCG/ACT inhaler Inhale 2 puffs into the lungs 2 (two) times daily.     . temazepam (RESTORIL) 30 MG capsule Take 30 mg by mouth at bedtime.    . vitamin C (ASCORBIC ACID) 500 MG tablet Take 1,000 mg by mouth daily.     . vitamin E 400 UNIT capsule Take 800 Units by mouth daily.     Marland Kitchen warfarin (COUMADIN) 5 MG tablet TAKE 1 TABLET BY MOUTH DAILY OR AS DIRECTED BY COUMADIN CLINIC 90 tablet 1   No current facility-administered medications for this visit.    Allergies:   Penicillins and Simvastatin    Social History:  The patient  reports that he has quit smoking. He has never used smokeless tobacco. He reports that he does not drink alcohol or use illicit drugs.   Family History:  The patient's family history includes Heart disease in his father and mother. There is no history of Colon cancer.    ROS:  Please see the history of present illness.    Otherwise, review of systems positive for none.   All other systems are reviewed and negative.    PHYSICAL EXAM: VS:  BP 114/68 mmHg  Pulse 80  Ht 5\' 9"  (1.753 m)  Wt 193 lb 12.8 oz (87.907 kg)  BMI 28.61 kg/m2 , BMI Body mass index is 28.61 kg/(m^2).  General: Alert, oriented x3, no distress Head: no evidence of trauma, PERRL, EOMI, no exophtalmos or lid lag, no myxedema, no xanthelasma; normal ears, nose and oropharynx Neck: normal jugular venous pulsations and no hepatojugular reflux; brisk carotid pulses without delay and no carotid bruits Chest: clear to auscultation, no signs of consolidation by percussion or palpation, normal fremitus, symmetrical and full respiratory excursions Cardiovascular: normal position and quality of the apical impulse, regular rhythm, normal first and second heart sounds, no murmurs, rubs or gallops Abdomen: no tenderness  or distention, no masses by palpation, no abnormal pulsatility or arterial bruits, normal bowel sounds, no hepatosplenomegaly Extremities: no clubbing, cyanosis or edema; 2+ radial, ulnar and brachial pulses bilaterally; 2+ right femoral, posterior tibial and dorsalis pedis pulses; 2+ left femoral, posterior tibial and dorsalis pedis pulses; no subclavian or femoral bruits Neurological: grossly nonfocal Psych: euthymic mood, full affect   EKG:  EKG is ordered today. The ekg ordered today demonstrates atrial paced, ventricular sensed, incomplete right bundle branch block, QRS 100 ms, QTC 426 ms   Recent  Labs: October 2015 creatinine 0.7, glucose 89, potassium 4.3, normal liver function tests, normal TSH    Lipid Panel October 2015 total cholesterol 130, HDL 49, LDL 71, triglycerides 51    Wt Readings from Last 3 Encounters:  09/12/14 193 lb 12.8 oz (87.907 kg)  01/03/14 191 lb 12.8 oz (87 kg)  07/01/13 198 lb (89.812 kg)     ASSESSMENT AND PLAN:  Paroxysmal atrial fibrillation For the last 12 months the burden has been only 1.5%, with the longest episodes being around 5 hours. He does not appear to be aware of the palpitations. Ventricular rate control is adequate. He is on appropriate anticoagulation without bleeding problems.  CAD status post stents to LAD and RCA He has never had angina pectoris, presented with worsening dyspnea. Cardiac catheterization in March 2015 showed no evidence of coronary lesions, the old stents were widely patent.  Permanent pacemaker Normal device function. No permanent reprogramming changes. Continuing with followup via the [facility] system.  Hyperlipidemia Followed by Dr. Tommy Medal, satisfactory profile last September  HTN Excellent control  Current medicines are reviewed at length with the patient today.  The patient does not have concerns regarding medicines.  The following changes have been made:  no change  Labs/ tests ordered today  include:  No orders of the defined types were placed in this encounter.    Patient Instructions  Remote monitoring is used to monitor your Pacemaker or ICD from home. This monitoring reduces the number of office visits required to check your device to one time per year. It allows [place] to monitor the functioning of your device to ensure it is working properly. You are scheduled for a device check from home on December 14, 2014. You may send your transmission at any time that day. If you have a wireless device, the transmission will be sent automatically. After your physician reviews your transmission, you will receive a postcard with your next transmission date.  Dr. Sallyanne Kuster recommends that you schedule a follow-up appointment in: One Year.        Mikael Spray, MD  09/12/2014 9:39 AM    Sanda Klein, MD, [facility] [phone] office [phone] pager

## 2014-09-12 NOTE — Patient Instructions (Signed)
Remote monitoring is used to monitor your Pacemaker or ICD from home. This monitoring reduces the number of office visits required to check your device to one time per year. It allows [place] to monitor the functioning of your device to ensure it is working properly. You are scheduled for a device check from home on December 14, 2014. You may send your transmission at any time that day. If you have a wireless device, the transmission will be sent automatically. After your physician reviews your transmission, you will receive a postcard with your next transmission date.  Dr. Sallyanne Kuster recommends that you schedule a follow-up appointment in: One Year.

## 2014-09-13 ENCOUNTER — Encounter: Payer: Self-pay | Admitting: Cardiovascular Disease

## 2014-09-13 LAB — CUP PACEART INCLINIC DEVICE CHECK
Battery Remaining Longevity: 92.4 mo
Brady Statistic RA Percent Paced: 97 %
Brady Statistic RV Percent Paced: 7.8 %
Lead Channel Impedance Value: 375 Ohm
Lead Channel Pacing Threshold Amplitude: 0.5 V
Lead Channel Pacing Threshold Amplitude: 0.75 V
Lead Channel Pacing Threshold Amplitude: 0.75 V
Lead Channel Pacing Threshold Pulse Width: 0.4 ms
Lead Channel Pacing Threshold Pulse Width: 0.4 ms
Lead Channel Sensing Intrinsic Amplitude: 2.6 mV
Lead Channel Setting Pacing Amplitude: 2.5 V
Lead Channel Setting Sensing Sensitivity: 2 mV
MDC IDC MSMT BATTERY VOLTAGE: 2.93 V
MDC IDC MSMT LEADCHNL RA PACING THRESHOLD AMPLITUDE: 0.5 V
MDC IDC MSMT LEADCHNL RA PACING THRESHOLD PULSEWIDTH: 0.4 ms
MDC IDC MSMT LEADCHNL RV IMPEDANCE VALUE: 525 Ohm
MDC IDC MSMT LEADCHNL RV PACING THRESHOLD PULSEWIDTH: 0.4 ms
MDC IDC MSMT LEADCHNL RV SENSING INTR AMPL: 11.9 mV
MDC IDC PG SERIAL: 7124006
MDC IDC SESS DTM: 20160517123009
MDC IDC SET LEADCHNL RA PACING AMPLITUDE: 2 V
MDC IDC SET LEADCHNL RV PACING PULSEWIDTH: 0.4 ms
Pulse Gen Model: 2210

## 2014-10-19 ENCOUNTER — Telehealth: Payer: Self-pay | Admitting: Cardiovascular Disease

## 2014-10-19 NOTE — Telephone Encounter (Signed)
Patient called back with BP and HR readings  135pm  107/58  HR 67 136pm  92/52  HR 66 145pm  87/49  HR 70  Patient was sitting down calmly during BP recordings Patient reports he does not notice a difference in how he feels when going from sitting to standing position. When going from lying to sitting, he sits on the side of the bed for a while before getting up.

## 2014-10-19 NOTE — Telephone Encounter (Signed)
Pt called in wanting to come in and see Dr. Loletha Grayer because he says that for the last week he has been feeling dizzy, lightheaded, and low energy. He isnt sure why. He did mention that some of it may be neuropathy. Please advise   Thanks

## 2014-10-19 NOTE — Telephone Encounter (Signed)
Per Dr. Loletha Grayer - patient should stop lisinopril and hold furosemide for 2 days if he is taking it. He should send device download. He should drink gatorade or V8

## 2014-10-19 NOTE — Telephone Encounter (Signed)
Is there someone who can check orthostatic vital signs and report to [place], please? Also have him perform a device download today please

## 2014-10-19 NOTE — Telephone Encounter (Signed)
Patient instructed on med changes (STOP lisinopril 5mg ) and will send device download and obtain gatorade/V8 to drink.  He will call back in a week with updated. He will let us know if BP >120 Patient has not taken lasix in 3 weeks.

## 2014-10-19 NOTE — Telephone Encounter (Signed)
Spoke with patient. He reports he "hasn't felt himself" - he reports dizziness that comes and goes and lingers (never usually lasts >12 hours). He reports lightheadedness and low energy. He reports that yesterday or the day before he felt like he could've passed out when he went outside and he took a few deep breaths. He denies chest pain, denies palpitations. He reports SOB that has progressively gotten a little worse. He had some pains in his right side that he thinks was indigestion. He reports he is not exercising like he would like to as he is taking care of his wife who is sick.  He thinks he may have neuropathy as his lower legs an feet are bothering.   He had a back injection last week (?) and took a 21 tab prednisone taper dose, finished on Sunday June 26. He thought this may contribute to his symptoms, but they have persisted despite being finished with medication.   He does not have any vital sign information to report at this time, but will check it and call back when he gets home.   Will defer to MD to review/advise

## 2014-10-20 NOTE — Telephone Encounter (Signed)
Informed patient that no arrhythmias were found on device. Device is functioning normally. Patient voiced understanding of information given. Will forward to [facility].

## 2014-10-20 NOTE — Telephone Encounter (Signed)
If he remains tachycardic (HR>110) after rehydrating and stopping the lisinopril he should seek medical attention. Seems to be hypovolemic, consider un recognizedbleeding

## 2014-10-25 ENCOUNTER — Ambulatory Visit (INDEPENDENT_AMBULATORY_CARE_PROVIDER_SITE_OTHER): Payer: PPO | Admitting: Pharmacist Clinician (PhC)/ Clinical Pharmacy Specialist

## 2014-10-25 DIAGNOSIS — Z7901 Long term (current) use of anticoagulants: Secondary | ICD-10-CM

## 2014-10-25 DIAGNOSIS — I480 Paroxysmal atrial fibrillation: Secondary | ICD-10-CM | POA: Diagnosis not present

## 2014-10-25 LAB — POCT INR: INR: 2.2

## 2014-11-01 ENCOUNTER — Telehealth: Payer: Self-pay | Admitting: Cardiovascular Disease

## 2014-11-01 NOTE — Telephone Encounter (Signed)
Pt. Called and informed  That even though Dr. Minna Antis is not in the office another physician in taht office can write him a script for his medication

## 2014-11-01 NOTE — Telephone Encounter (Signed)
Pt called in wanting to know if Dr. Loletha Grayer would write a prescription for his ProAir because he is having a lot of SOB lately. His PCP is no longer in town and has not seen his new doctor yet. His original rx was written for 2 puffs q4h prn. Please f/u with him  Thanks

## 2014-11-04 ENCOUNTER — Encounter (HOSPITAL_COMMUNITY): Payer: Self-pay | Admitting: Emergency Medicine

## 2014-11-04 ENCOUNTER — Emergency Department (HOSPITAL_COMMUNITY): Payer: PPO

## 2014-11-04 ENCOUNTER — Inpatient Hospital Stay (HOSPITAL_COMMUNITY)
Admission: EM | Admit: 2014-11-04 | Discharge: 2014-11-06 | DRG: 310 | Disposition: A | Discharge status: Home / Self Care | Payer: PPO | Attending: Cardiovascular Disease | Admitting: Cardiovascular Disease

## 2014-11-04 DIAGNOSIS — Z888 Allergy status to other drugs, medicaments and biological substances status: Secondary | ICD-10-CM

## 2014-11-04 DIAGNOSIS — I480 Paroxysmal atrial fibrillation: Principal | ICD-10-CM | POA: Diagnosis present

## 2014-11-04 DIAGNOSIS — I2510 Atherosclerotic heart disease of native coronary artery without angina pectoris: Secondary | ICD-10-CM | POA: Diagnosis present

## 2014-11-04 DIAGNOSIS — K2270 Barrett's esophagus without dysplasia: Secondary | ICD-10-CM | POA: Diagnosis present

## 2014-11-04 DIAGNOSIS — M1990 Unspecified osteoarthritis, unspecified site: Secondary | ICD-10-CM | POA: Diagnosis present

## 2014-11-04 DIAGNOSIS — Z9849 Cataract extraction status, unspecified eye: Secondary | ICD-10-CM | POA: Diagnosis not present

## 2014-11-04 DIAGNOSIS — I700 Atherosclerosis of aorta: Secondary | ICD-10-CM | POA: Diagnosis present

## 2014-11-04 DIAGNOSIS — K219 Gastro-esophageal reflux disease without esophagitis: Secondary | ICD-10-CM | POA: Diagnosis present

## 2014-11-04 DIAGNOSIS — J45909 Unspecified asthma, uncomplicated: Secondary | ICD-10-CM | POA: Diagnosis present

## 2014-11-04 DIAGNOSIS — R0600 Dyspnea, unspecified: Secondary | ICD-10-CM

## 2014-11-04 DIAGNOSIS — E785 Hyperlipidemia, unspecified: Secondary | ICD-10-CM | POA: Diagnosis present

## 2014-11-04 DIAGNOSIS — Z87891 Personal history of nicotine dependence: Secondary | ICD-10-CM | POA: Diagnosis not present

## 2014-11-04 DIAGNOSIS — R634 Abnormal weight loss: Secondary | ICD-10-CM | POA: Diagnosis present

## 2014-11-04 DIAGNOSIS — Z9852 Vasectomy status: Secondary | ICD-10-CM | POA: Diagnosis not present

## 2014-11-04 DIAGNOSIS — J449 Chronic obstructive pulmonary disease, unspecified: Secondary | ICD-10-CM | POA: Diagnosis present

## 2014-11-04 DIAGNOSIS — I4891 Unspecified atrial fibrillation: Secondary | ICD-10-CM | POA: Diagnosis present

## 2014-11-04 DIAGNOSIS — I10 Essential (primary) hypertension: Secondary | ICD-10-CM | POA: Diagnosis present

## 2014-11-04 DIAGNOSIS — Z880 Allergy status to penicillin: Secondary | ICD-10-CM

## 2014-11-04 DIAGNOSIS — Z950 Presence of cardiac pacemaker: Secondary | ICD-10-CM | POA: Diagnosis not present

## 2014-11-04 DIAGNOSIS — Z7982 Long term (current) use of aspirin: Secondary | ICD-10-CM | POA: Diagnosis not present

## 2014-11-04 DIAGNOSIS — K449 Diaphragmatic hernia without obstruction or gangrene: Secondary | ICD-10-CM | POA: Diagnosis present

## 2014-11-04 DIAGNOSIS — Z955 Presence of coronary angioplasty implant and graft: Secondary | ICD-10-CM | POA: Diagnosis not present

## 2014-11-04 DIAGNOSIS — Z6826 Body mass index (BMI) 26.0-26.9, adult: Secondary | ICD-10-CM

## 2014-11-04 DIAGNOSIS — Z7901 Long term (current) use of anticoagulants: Secondary | ICD-10-CM | POA: Diagnosis not present

## 2014-11-04 LAB — PROTIME-INR
INR: 2.28 — ABNORMAL HIGH (ref 0.00–1.49)
PROTHROMBIN TIME: 24.9 s — AB (ref 11.6–15.2)

## 2014-11-04 LAB — CBC
HCT: 38.9 % — ABNORMAL LOW (ref 39.0–52.0)
Hemoglobin: 12.9 g/dL — ABNORMAL LOW (ref 13.0–17.0)
MCH: 30.5 pg (ref 26.0–34.0)
MCHC: 33.2 g/dL (ref 30.0–36.0)
MCV: 92 fL (ref 78.0–100.0)
PLATELETS: 420 10*3/uL — AB (ref 150–400)
RBC: 4.23 MIL/uL (ref 4.22–5.81)
RDW: 15.3 % (ref 11.5–15.5)
WBC: 9.4 10*3/uL (ref 4.0–10.5)

## 2014-11-04 LAB — I-STAT TROPONIN, ED: Troponin i, poc: 0.01 ng/mL (ref 0.00–0.08)

## 2014-11-04 LAB — BASIC METABOLIC PANEL
Anion gap: 8 (ref 5–15)
BUN: 11 mg/dL (ref 6–20)
CHLORIDE: 102 mmol/L (ref 101–111)
CO2: 27 mmol/L (ref 22–32)
CREATININE: 0.88 mg/dL (ref 0.61–1.24)
Calcium: 8.7 mg/dL — ABNORMAL LOW (ref 8.9–10.3)
GFR calc Af Amer: >60 mL/min (ref >60)
Glucose, Bld: 113 mg/dL — ABNORMAL HIGH (ref 65–99)
Potassium: 4.2 mmol/L (ref 3.5–5.1)
Sodium: 137 mmol/L (ref 135–145)

## 2014-11-04 LAB — TSH: TSH: 1.427 u[IU]/mL (ref 0.350–4.500)

## 2014-11-04 LAB — T4, FREE: Free T4: 0.93 ng/dL (ref 0.61–1.12)

## 2014-11-04 MED ORDER — SENNOSIDES-DOCUSATE SODIUM 8.6-50 MG PO TABS
1.0000 | ORAL_TABLET | Freq: Every day | ORAL | Status: DC
  Administered 2014-11-04 – 2014-11-05 (×2): 1 via ORAL
  Filled 2014-11-04 (×2): qty 1

## 2014-11-04 MED ORDER — FINASTERIDE 5 MG PO TABS
5.0000 mg | ORAL_TABLET | Freq: Every day | ORAL | Status: DC
  Administered 2014-11-04 – 2014-11-05 (×2): 5 mg via ORAL
  Filled 2014-11-04 (×2): qty 1

## 2014-11-04 MED ORDER — DILTIAZEM HCL 100 MG IV SOLR
5.0000 mg/h | Freq: Once | INTRAVENOUS | Status: AC
  Administered 2014-11-04: 5 mg/h via INTRAVENOUS

## 2014-11-04 MED ORDER — DILTIAZEM LOAD VIA INFUSION
10.0000 mg | Freq: Once | INTRAVENOUS | Status: AC
  Administered 2014-11-04: 10 mg via INTRAVENOUS

## 2014-11-04 MED ORDER — WARFARIN SODIUM 2.5 MG PO TABS: 2.5000 mg | ORAL_TABLET | Freq: Every day | ORAL | Status: DC

## 2014-11-04 MED ORDER — DEXTROSE 5 % IV SOLN
5.0000 mg/h | INTRAVENOUS | Status: DC
  Administered 2014-11-04: 10 mg/h via INTRAVENOUS
  Administered 2014-11-04: 15 mg/h via INTRAVENOUS
  Administered 2014-11-05 – 2014-11-06 (×3): 10 mg/h via INTRAVENOUS
  Filled 2014-11-04: qty 100

## 2014-11-04 MED ORDER — WARFARIN - PHARMACIST DOSING INPATIENT: Freq: Every day | Status: DC

## 2014-11-04 MED ORDER — NEBIVOLOL HCL 5 MG PO TABS
5.0000 mg | ORAL_TABLET | Freq: Every day | ORAL | Status: DC
  Administered 2014-11-04 – 2014-11-05 (×2): 5 mg via ORAL
  Filled 2014-11-04 (×2): qty 1

## 2014-11-04 MED ORDER — DOCUSATE SODIUM 100 MG PO CAPS
100.0000 mg | ORAL_CAPSULE | Freq: Every evening | ORAL | Status: DC | PRN
  Administered 2014-11-05: 100 mg via ORAL
  Filled 2014-11-04: qty 1

## 2014-11-04 MED ORDER — ACETAMINOPHEN 325 MG PO TABS: 650.0000 mg | ORAL_TABLET | ORAL | Status: DC | PRN

## 2014-11-04 MED ORDER — TEMAZEPAM 15 MG PO CAPS
30.0000 mg | ORAL_CAPSULE | Freq: Every day | ORAL | Status: DC
  Administered 2014-11-04 – 2014-11-05 (×2): 30 mg via ORAL
  Filled 2014-11-04 (×2): qty 2

## 2014-11-04 MED ORDER — ONDANSETRON HCL 4 MG/2ML IJ SOLN: 4.0000 mg | Freq: Four times a day (QID) | INTRAMUSCULAR | Status: DC | PRN

## 2014-11-04 MED ORDER — WARFARIN SODIUM 2.5 MG PO TABS
2.5000 mg | ORAL_TABLET | ORAL | Status: DC
  Administered 2014-11-04: 2.5 mg via ORAL
  Filled 2014-11-04: qty 1

## 2014-11-04 MED ORDER — SODIUM CHLORIDE 0.9 % IJ SOLN
3.0000 mL | Freq: Two times a day (BID) | INTRAMUSCULAR | Status: DC
Start: 2014-11-04 — End: 2014-11-06

## 2014-11-04 MED ORDER — PANTOPRAZOLE SODIUM 40 MG PO TBEC
40.0000 mg | DELAYED_RELEASE_TABLET | Freq: Every day | ORAL | Status: DC
  Administered 2014-11-05 – 2014-11-06 (×2): 40 mg via ORAL
  Filled 2014-11-04 (×2): qty 1

## 2014-11-04 MED ORDER — WARFARIN SODIUM 5 MG PO TABS
5.0000 mg | ORAL_TABLET | ORAL | Status: DC
  Administered 2014-11-05 – 2014-11-06 (×2): 5 mg via ORAL
  Filled 2014-11-04 (×2): qty 1

## 2014-11-04 MED ORDER — SODIUM CHLORIDE 0.9 % IV SOLN: 250.0000 mL | INTRAVENOUS | Status: DC | PRN

## 2014-11-04 MED ORDER — SODIUM CHLORIDE 0.9 % IJ SOLN: 3.0000 mL | INTRAMUSCULAR | Status: DC | PRN

## 2014-11-04 MED ORDER — ASPIRIN EC 81 MG PO TBEC
81.0000 mg | DELAYED_RELEASE_TABLET | Freq: Every day | ORAL | Status: DC
  Administered 2014-11-05 – 2014-11-06 (×2): 81 mg via ORAL
  Filled 2014-11-04 (×2): qty 1

## 2014-11-04 MED ORDER — BUDESONIDE-FORMOTEROL FUMARATE 160-4.5 MCG/ACT IN AERO
1.0000 | INHALATION_SPRAY | Freq: Two times a day (BID) | RESPIRATORY_TRACT | Status: DC
  Administered 2014-11-04 – 2014-11-06 (×4): 2 via RESPIRATORY_TRACT
  Filled 2014-11-04: qty 6

## 2014-11-04 MED ORDER — ROSUVASTATIN CALCIUM 10 MG PO TABS
5.0000 mg | ORAL_TABLET | ORAL | Status: DC
  Administered 2014-11-06: 5 mg via ORAL
  Filled 2014-11-04: qty 1

## 2014-11-04 NOTE — ED Notes (Signed)
Pt. Stated, my heart is just all over the place I could feel it last night and I thought it would go away by this morning. No pain, just heart high.

## 2014-11-04 NOTE — Progress Notes (Signed)
Pt HR 100-120's when laying in bed and pt's HR will go up to the 140's when going to the bathroom. Cardizem drip at 10 mg/hr BP 97/60. Md on call made aware and said to continue Cardizem drip at 10 mg/hr. Will cont to monitor pt.

## 2014-11-04 NOTE — H&P (Signed)
Admit date: 11/04/2014 Referring Physician  Dr. Wyvonnia Dusky Primary Physician Tommy Medal, MD Primary Cardiologist  Dr. Recardo Evangelist Reason for Consultation/chief complaint  AFIB with RVR  HPI: 81 year old male with paroxysmal atrial fibrillation, pacemaker placement, coronary artery disease with chronic mild dyspnea NYHA functional class II here in the emergency room with atrial fibrillation with rapid ventricular response.  He is on Coumadin, Cardizem, Bystolic. He feels as though he went into atrial fibrillation approximately yesterday afternoon and feels his heart skipping. No significant complaints other than shortness of breath which she has at baseline but may have progressively worsened with activity over time. 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 Increased stress. Last INR 2 weeks ago 2.2.    Drug-eluting stents to the LAD and right coronary artery in 2006, cardiac catheterization last in March 2015 showed patent stents. Received St. Jude pacemaker in March 2011 for tachycardia-bradycardia syndrome. His atrial fibrillation burden had been stable at 1.5%. No anginal symptoms. EF 55%.  He has had weight loss over the past year and a half, likely from stress. No melena.    PMH:   Past Medical History  Diagnosis Date  . Hiatal hernia 03-19-2009    EGD  . Erosive esophagitis 03-19-2009    EGD  . Barrett's esophagus 03-19-2009    EGD  . CAD (coronary artery disease)   . Paroxysmal atrial fibrillation   . Internal hemorrhoids 03-19-2009    Colonoscopy  . Diverticulosis 03-19-2009    Colonoscopy   . Arthritis   . Asthma   . Cataract     REMOVED  . COPD (chronic obstructive pulmonary disease)   . GERD (gastroesophageal reflux disease)   . Hyperlipidemia   . Hypertension     PSH:   Past Surgical History  Procedure Laterality Date  . Vasectomy    . Hemorrhoid surgery    . Tonsillectomy    . Coronary stent placement  2006    Right Coronary Artery  Cypher stents placed 2006  . Cataract extraction    . Colonoscopy    . Pacemaker insertion  07/25/2009    St.Jude Accent  . Cardiac catheterization  02/12/2009    mild ostial left main disease,patent LAD & RCA stents   . US echocardiography  07/12/2009    borderline LA enlargement,mild mitral annular ca+, AOV mildly sclerotic,trace AI.  Marland Kitchen Nm myocar perf wall motion  08/14/2008    no significant ischemia EF 64%  . Left heart catheterization with coronary angiogram N/A 07/01/2013    Procedure: LEFT HEART CATHETERIZATION WITH CORONARY ANGIOGRAM;  Surgeon: Sanda Klein, MD;  Location: [facility];  Service: Cardiovascular;  Laterality: N/A;   Allergies:  Simvastatin and Penicillins Prior to Admit Meds:   Prior to Admission medications   Medication Sig Start Date End Date Taking? Authorizing Provider  acetaminophen (TYLENOL) 500 MG tablet Take 500 mg by mouth daily.    Yes Historical Provider, MD  albuterol (PROVENTIL HFA;VENTOLIN HFA) 108 (90 BASE) MCG/ACT inhaler Inhale 2 puffs into the lungs every 4 (four) hours as needed for wheezing or shortness of breath. ProAir   Yes Historical Provider, MD  Ascorbic Acid (VITAMIN C) 1000 MG tablet Take 1,000 mg by mouth daily.   Yes Historical Provider, MD  aspirin EC 81 MG tablet Take 81 mg by mouth daily.   Yes Historical Provider, MD  b complex vitamins capsule Take 1 capsule by mouth daily.   Yes Historical Provider, MD  budesonide-formoterol (  SYMBICORT) 160-4.5 MCG/ACT inhaler Inhale 1-2 puffs into the lungs 2 (two) times daily. Inhale 2 puffs every morning and 1 puff at bedtime   Yes Historical Provider, MD  Cholecalciferol (VITAMIN D) 2000 UNITS tablet Take 2,000 Units by mouth daily.   Yes Historical Provider, MD  diltiazem (CARTIA XT) 120 MG 24 hr capsule Take 1 capsule (120 mg total) by mouth daily. Patient taking differently: Take 120 mg by mouth at bedtime.  06/08/14  Yes Mihai Croitoru, MD  docusate sodium (COLACE) 100 MG capsule Take 100 mg by  mouth at bedtime as needed (constipation).   Yes Historical Provider, MD  Esomeprazole Magnesium (NEXIUM PO) Take 44.6 mg by mouth daily.   Yes Historical Provider, MD  finasteride (PROSCAR) 5 MG tablet Take 5 mg by mouth at bedtime.  02/17/12  Yes Historical Provider, MD  furosemide (LASIX) 20 MG tablet Take 20 mg by mouth daily as needed for fluid or edema.    Yes Historical Provider, MD  Multiple Vitamins-Minerals (HAIR/SKIN/NAILS) TABS Take 2 tablets by mouth daily.   Yes Historical Provider, MD  nebivolol (BYSTOLIC) 5 MG tablet Take 1 tablet (5 mg total) by mouth daily. Patient taking differently: Take 5 mg by mouth at bedtime.  02/01/14  Yes Mihai Croitoru, MD  neomycin-bacitracin-polymyxin (NEOSPORIN) OINT Apply 1 application topically daily as needed for wound care.   Yes Historical Provider, MD  nitroGLYCERIN (NITROSTAT) 0.4 MG SL tablet Place 0.4 mg under the tongue every 5 (five) minutes as needed for chest pain.    Yes Historical Provider, MD  Omega-3 Fatty Acids (FISH OIL) 1200 MG CAPS Take 2,400 mg by mouth daily.   Yes Historical Provider, MD  rosuvastatin (CRESTOR) 5 MG tablet Take 5 mg by mouth 3 (three) times a week. On Monday, Wednesday, and Friday nights   Yes Historical Provider, MD  senna-docusate (SENNA-S) 8.6-50 MG per tablet Take 1 tablet by mouth at bedtime.   Yes Historical Provider, MD  temazepam (RESTORIL) 30 MG capsule Take 30 mg by mouth at bedtime. 05/24/13  Yes Historical Provider, MD  vitamin E 400 UNIT capsule Take 400 Units by mouth daily.    Yes Historical Provider, MD  warfarin (COUMADIN) 5 MG tablet TAKE 1 TABLET BY MOUTH DAILY OR AS DIRECTED BY COUMADIN CLINIC Patient taking differently: Take 2.5-5 mg by mouth at bedtime. Take 1/2 tablet (2.5 mg) on Wednesday and Saturday, take 1 tablet (5 mg) on Sunday, Monday, Tuesday, Thursday, Friday OR AS DIRECTED BY COUMADIN CLINIC 08/10/14  Yes Sanda Klein, MD   Fam HX:    Family History  Problem Relation Age of Onset   . Colon cancer Neg Hx   . Heart disease Mother   . Heart disease Father    Social HX:    History   Social History  . Marital Status: Married    Spouse Name: N/A  . Number of Children: N/A  . Years of Education: N/A   Occupational History  . Retired     Social History Main Topics  . Smoking status: Former Research scientist (life sciences)  . Smokeless tobacco: Never Used     Comment: Quit smoking 1996  . Alcohol Use: No  . Drug Use: No  . Sexual Activity: Not on file   Other Topics Concern  . Not on file   Social History Narrative   Daily caffeine      ROS:  Positive for generalized weakness, palpitations. All 11 ROS were addressed and are negative except what is stated  in the HPI   Physical Exam: Blood pressure 117/76, pulse 125, temperature 97.7 F (36.5 C), temperature source Oral, resp. rate 18, height 5\' 9"  (1.753 m), weight 188 lb (85.276 kg), SpO2 92 %.   General: Elderly Well developed, well nourished, in no acute distress Head: Eyes PERRLA, No xanthomas.   Normal cephalic and atramatic  Lungs:   Clear bilaterally to auscultation and percussion. Normal respiratory effort. No wheezes, no rales. Heart:  Irregularly irregular Pulses are 2+ & equal. No murmur, rubs, gallops.  No carotid bruit. No JVD.  No abdominal bruits.  Abdomen: Bowel sounds are positive, abdomen soft and non-tender without masses. No hepatosplenomegaly. Msk:  Back normal. Normal strength and tone for age. Extremities:  No clubbing, cyanosis or edema.  DP +1 Neuro: Alert and oriented X 3, non-focal, MAE x 4 GU: Deferred Rectal: Deferred Psych:  Good affect, responds appropriately      Labs: Lab Results  Component Value Date   WBC 9.4 11/04/2014   HGB 12.9* 11/04/2014   HCT 38.9* 11/04/2014   MCV 92.0 11/04/2014   PLT 420* 11/04/2014     Recent Labs Lab 11/04/14 1334  NA 137  K 4.2  CL 102  CO2 27  BUN 11  CREATININE 0.88  CALCIUM 8.7*  GLUCOSE 113*   No results for input(s): CKTOTAL, CKMB,  TROPONINI in the last 72 hours. No results found for: CHOL, HDL, LDLCALC, TRIG No results found for: DDIMER   Radiology:  No results found. Personally viewed.  EKG:  11/04/14-atrial fibrillation with rapid ventricular response-heart rate 129 bpm no ST segment changes Personally viewed.   ASSESSMENT/PLAN:    81 year old male with paroxysmal atrial fibrillation, pacemaker here with rapid ventricular response.  1. Atrial fibrillation with rapid ventricular response  - CHADSVASc - 3  - Currently on IV diltiazem 10.  - Rate is better controlled. Given his low overall atrial fibrillation burden, 1.5% on last pacemaker check, it is likely that he will convert within the next 24 hours. If he does not, we will plan on electrical cardioversion Monday. Since he has a pacemaker, he will need pacemaker rep present. We contemplated cardioversion here in the emergency department however given his advanced age and pacemaker I would rather admit. He has remained therapeutic over the last 3 weeks.  - Dyspnea is slightly exacerbated from atrial fibrillation.  2. Coronary artery disease-Cypher stents LAD, RCA 2006, stable, no anginal symptoms, last catheterization 2015, patent.  3. Chronic anticoagulation-Coumadin, therapeutic  4. Pacemaker-previous atrial fibrillation burden quite small at 1.5%. Functioning normally.  5. Aortic atherosclerosis-noted on chest x-ray. Statin.  6. Hyperlipidemia-continue statin therapy.  Candee Furbish, MD  11/04/2014  4:17 PM

## 2014-11-04 NOTE — Progress Notes (Signed)
ANTICOAGULATION CONSULT NOTE - Initial Consult  Pharmacy Consult for Coumadin Indication: atrial fibrillation  Allergies  Allergen Reactions  . Simvastatin Other (See Comments)    Unknown reaction  . Penicillins Hives, Swelling and Rash    Patient Measurements: Height: 5\' 9"  (175.3 cm) Weight: 188 lb (85.276 kg) IBW/kg (Calculated) : 70.7 Heparin Dosing Weight:    Vital Signs: Temp: 97.7 F (36.5 C) (07/09 1328) Temp Source: Oral (07/09 1328) BP: 89/44 mmHg (07/09 1902) Pulse Rate: 104 (07/09 1902)  Labs:  Recent Labs  11/04/14 1334  HGB 12.9*  HCT 38.9*  PLT 420*  LABPROT 24.9*  INR 2.28*  CREATININE 0.88    Estimated Creatinine Clearance: 68.8 mL/min (by C-G formula based on Cr of 0.88).   Medical History: Past Medical History  Diagnosis Date  . Hiatal hernia 03-19-2009    EGD  . Erosive esophagitis 03-19-2009    EGD  . Barrett's esophagus 03-19-2009    EGD  . CAD (coronary artery disease)   . Paroxysmal atrial fibrillation   . Internal hemorrhoids 03-19-2009    Colonoscopy  . Diverticulosis 03-19-2009    Colonoscopy   . Arthritis   . Asthma   . Cataract     REMOVED  . COPD (chronic obstructive pulmonary disease)   . GERD (gastroesophageal reflux disease)   . Hyperlipidemia   . Hypertension     Medications:  Prescriptions prior to admission  Medication Sig Dispense Refill Last Dose  . acetaminophen (TYLENOL) 500 MG tablet Take 500 mg by mouth daily.    11/04/2014 at Unknown time  . albuterol (PROVENTIL HFA;VENTOLIN HFA) 108 (90 BASE) MCG/ACT inhaler Inhale 2 puffs into the lungs every 4 (four) hours as needed for wheezing or shortness of breath. ProAir   few days ago  . Ascorbic Acid (VITAMIN C) 1000 MG tablet Take 1,000 mg by mouth daily.   11/04/2014 at Unknown time  . aspirin EC 81 MG tablet Take 81 mg by mouth daily.   11/04/2014 at Unknown time  . b complex vitamins capsule Take 1 capsule by mouth daily.   11/04/2014 at Unknown time  .  budesonide-formoterol (SYMBICORT) 160-4.5 MCG/ACT inhaler Inhale 1-2 puffs into the lungs 2 (two) times daily. Inhale 2 puffs every morning and 1 puff at bedtime   11/04/2014 at am  . Cholecalciferol (VITAMIN D) 2000 UNITS tablet Take 2,000 Units by mouth daily.   11/04/2014 at Unknown time  . diltiazem (CARTIA XT) 120 MG 24 hr capsule Take 1 capsule (120 mg total) by mouth daily. (Patient taking differently: Take 120 mg by mouth at bedtime. ) 90 capsule 2 11/03/2014 at Unknown time  . docusate sodium (COLACE) 100 MG capsule Take 100 mg by mouth at bedtime as needed (constipation).   few days ago at Unknown time  . Esomeprazole Magnesium (NEXIUM PO) Take 44.6 mg by mouth daily.   11/04/2014 at Unknown time  . finasteride (PROSCAR) 5 MG tablet Take 5 mg by mouth at bedtime.    11/03/2014 at Unknown time  . furosemide (LASIX) 20 MG tablet Take 20 mg by mouth daily as needed for fluid or edema.    several weeks ago  . Multiple Vitamins-Minerals (HAIR/SKIN/NAILS) TABS Take 2 tablets by mouth daily.   11/04/2014 at Unknown time  . nebivolol (BYSTOLIC) 5 MG tablet Take 1 tablet (5 mg total) by mouth daily. (Patient taking differently: Take 5 mg by mouth at bedtime. ) 30 tablet 10 11/03/2014 at 1730-1800  . neomycin-bacitracin-polymyxin (NEOSPORIN) OINT  Apply 1 application topically daily as needed for wound care.   11/03/2014 at Unknown time  . nitroGLYCERIN (NITROSTAT) 0.4 MG SL tablet Place 0.4 mg under the tongue every 5 (five) minutes as needed for chest pain.    never used  . Omega-3 Fatty Acids (FISH OIL) 1200 MG CAPS Take 2,400 mg by mouth daily.   11/04/2014 at Unknown time  . rosuvastatin (CRESTOR) 5 MG tablet Take 5 mg by mouth 3 (three) times a week. On Monday, Wednesday, and Friday nights   11/03/2014 at Unknown time  . senna-docusate (SENNA-S) 8.6-50 MG per tablet Take 1 tablet by mouth at bedtime.   11/03/2014 at Unknown time  . temazepam (RESTORIL) 30 MG capsule Take 30 mg by mouth at bedtime.   11/03/2014 at Unknown  time  . vitamin E 400 UNIT capsule Take 400 Units by mouth daily.    11/04/2014 at Unknown time  . warfarin (COUMADIN) 5 MG tablet TAKE 1 TABLET BY MOUTH DAILY OR AS DIRECTED BY COUMADIN CLINIC (Patient taking differently: Take 2.5-5 mg by mouth at bedtime. Take 1/2 tablet (2.5 mg) on Wednesday and Saturday, take 1 tablet (5 mg) on Sunday, Monday, Tuesday, Thursday, Friday OR AS DIRECTED BY COUMADIN CLINIC) 90 tablet 1 11/03/2014 at Unknown time    Assessment: 81 y/o M presents with SOB, palpitations, and weakness. He has h/o afib on Coumadin PTA for afib.  PMH includes Afib, CAD, PPM, CHF EF 55%,   Anticoag: Afib on Coumadin PTA. CHADSVASc - 3. Admit INR 2.28 on 5mg  MTThF,Sun and 2.5mg  on Wed/Sat. Resume home dose.  Goal of Therapy:  INR 2-3 Monitor platelets by anticoagulation protocol: Yes   Plan:  Coumadin 5mg  MTThF,Sun and 2.5mg  on Wed/Sat. Daily INR  Wayland Salinas 11/04/2014,7:42 PM

## 2014-11-04 NOTE — ED Provider Notes (Signed)
CSN: [id number]     Arrival date & time 11/04/14  1324 History   First MD Initiated Contact with Patient 11/04/14 1421     Chief Complaint  Patient presents with  . Palpitations  . Weakness     (Consider location/radiation/quality/duration/timing/severity/associated sxs/prior Treatment) HPI Comments: Patient with history of atrial fibrillation on Coumadin, on Cardizem and Bystolic, St. Jude pacemaker for symptomatic bradycardia -- presents with complaint of fatigue and shortness of breath, palpitations and elevated heart rate. Patient has been in rapid atrial fibrillation to 125 bpm. He thinks that he went into atrial fibrillation sometime yesterday afternoon. He denies chest pain. No other medical complaints other than shortness of breath which has been progressively worsening with activity over some time. He is not more short of breath than usual today. 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 He is under a lot of stress because of this. Last INR check was 2 weeks ago, INR = 2.2 per patient. The onset of this condition was acute. The course is constant. Aggravating factors: none. Alleviating factors: none.    The history is provided by the patient and medical records.    Past Medical History  Diagnosis Date  . Hiatal hernia 03-19-2009    EGD  . Erosive esophagitis 03-19-2009    EGD  . Barrett's esophagus 03-19-2009    EGD  . CAD (coronary artery disease)   . Paroxysmal atrial fibrillation   . Internal hemorrhoids 03-19-2009    Colonoscopy  . Diverticulosis 03-19-2009    Colonoscopy   . Arthritis   . Asthma   . Cataract     REMOVED  . COPD (chronic obstructive pulmonary disease)   . GERD (gastroesophageal reflux disease)   . Hyperlipidemia   . Hypertension    Past Surgical History  Procedure Laterality Date  . Vasectomy    . Hemorrhoid surgery    . Tonsillectomy    . Coronary stent placement  2006    Right Coronary  Artery Cypher stents placed 2006  . Cataract extraction    . Colonoscopy    . Pacemaker insertion  07/25/2009    St.Jude Accent  . Cardiac catheterization  02/12/2009    mild ostial left main disease,patent LAD & RCA stents   . US echocardiography  07/12/2009    borderline LA enlargement,mild mitral annular ca+, AOV mildly sclerotic,trace AI.  Marland Kitchen Nm myocar perf wall motion  08/14/2008    no significant ischemia EF 64%  . Left heart catheterization with coronary angiogram N/A 07/01/2013    Procedure: LEFT HEART CATHETERIZATION WITH CORONARY ANGIOGRAM;  Surgeon: Sanda Klein, MD;  Location: [facility];  Service: Cardiovascular;  Laterality: N/A;   Family History  Problem Relation Age of Onset  . Colon cancer Neg Hx   . Heart disease Mother   . Heart disease Father    History  Substance Use Topics  . Smoking status: Former Research scientist (life sciences)  . Smokeless tobacco: Never Used     Comment: Quit smoking 1996  . Alcohol Use: No    Review of Systems  Constitutional: Positive for fatigue. Negative for fever.  HENT: Negative for rhinorrhea and sore throat.   Eyes: Negative for redness.  Respiratory: Positive for shortness of breath. Negative for cough.   Cardiovascular: Negative for chest pain.  Gastrointestinal: Negative for nausea, vomiting, abdominal pain and diarrhea.  Genitourinary: Negative for dysuria.  Musculoskeletal: Negative for myalgias.  Skin: Negative for rash.  Neurological: Positive for light-headedness. Negative for syncope and headaches.      Allergies  Simvastatin and Penicillins  Home Medications   Prior to Admission medications   Medication Sig Start Date End Date Taking? Authorizing Provider  acetaminophen (TYLENOL) 500 MG tablet Take 500 mg by mouth daily as needed for moderate pain.    Historical Provider, MD  aspirin 81 MG tablet Take 81 mg by mouth daily.    Historical Provider, MD  b complex vitamins capsule Take 1 capsule by mouth daily.    Historical Provider,  MD  diltiazem (CARTIA XT) 120 MG 24 hr capsule Take 1 capsule (120 mg total) by mouth daily. 06/08/14   Mihai Croitoru, MD  doxycycline (VIBRA-TABS) 100 MG tablet Take 100 mg by mouth 2 (two) times daily. 14 day course filled 08/07/14 08/07/14   Historical Provider, MD  esomeprazole (NEXIUM) 20 MG capsule Take 40 mg by mouth every morning.     Historical Provider, MD  finasteride (PROSCAR) 5 MG tablet Take 5 mg by mouth daily.  02/17/12   Historical Provider, MD  furosemide (LASIX) 20 MG tablet Take 20 mg by mouth daily as needed for fluid.    Historical Provider, MD  lisinopril (PRINIVIL,ZESTRIL) 5 MG tablet  09/06/14   Historical Provider, MD  nebivolol (BYSTOLIC) 5 MG tablet Take 1 tablet (5 mg total) by mouth daily. 02/01/14   Mihai Croitoru, MD  nitroGLYCERIN (NITROSTAT) 0.4 MG SL tablet Place 0.4 mg under the tongue every 5 (five) minutes as needed for chest pain.     Historical Provider, MD  Omega-3 Fatty Acids (FISH OIL) 1000 MG CAPS Take 2,000 capsules by mouth daily.     Historical Provider, MD  predniSONE (STERAPRED UNI-PAK 21 TAB) 10 MG (21) TBPK tablet Use as directed on package - 6 day course filled 10/10/14 10/10/14   Historical Provider, MD  PROAIR HFA 108 (90 BASE) MCG/ACT inhaler  11/01/14   Historical Provider, MD  rosuvastatin (CRESTOR) 5 MG tablet Take 5 mg by mouth 3 (three) times a week. On Monday, Wednesday, and Friday.    Historical Provider, MD  SENNA PO Take 1 tablet by mouth at bedtime.    Historical Provider, MD  SYMBICORT 160-4.5 MCG/ACT inhaler Inhale 2 puffs into the lungs 2 (two) times daily.  03/23/12   Historical Provider, MD  temazepam (RESTORIL) 30 MG capsule Take 30 mg by mouth at bedtime. 05/24/13   Historical Provider, MD  vitamin C (ASCORBIC ACID) 500 MG tablet Take 1,000 mg by mouth daily.     Historical Provider, MD  vitamin E 400 UNIT capsule Take 800 Units by mouth daily.     Historical Provider, MD  warfarin (COUMADIN) 5 MG tablet TAKE 1 TABLET BY MOUTH DAILY OR  AS DIRECTED BY COUMADIN CLINIC 08/10/14   Mihai Croitoru, MD   BP 117/76 mmHg  Pulse 125  Temp(Src) 97.7 F (36.5 C) (Oral)  Resp 18  Ht 5\' 9"  (1.753 m)  Wt 188 lb (85.276 kg)  BMI 27.75 kg/m2  SpO2 92% Physical Exam  Constitutional: He appears well-developed and well-nourished.  HENT:  Head: Normocephalic and atraumatic.  Mouth/Throat: Oropharynx is clear and moist.  Eyes: Conjunctivae are normal. Right eye exhibits no discharge. Left eye exhibits no discharge.  Neck: Normal range of motion. Neck supple.  Cardiovascular: Normal heart sounds.  An irregularly irregular rhythm present. Tachycardia present.   No murmur heard. HR 95-120  Pulmonary/Chest: Effort normal and breath sounds normal. No respiratory distress. He  has no wheezes. He has no rales.  Abdominal: Soft. There is no tenderness.  Neurological: He is alert.  Skin: Skin is warm and dry.  Psychiatric: He has a normal mood and affect.  Nursing note and vitals reviewed.   ED Course  Procedures (including critical care time) Labs Review Labs Reviewed  CBC - Abnormal; Notable for the following:    Hemoglobin 12.9 (*)    HCT 38.9 (*)    Platelets 420 (*)    All other components within normal limits  BASIC METABOLIC PANEL - Abnormal; Notable for the following:    Glucose, Bld 113 (*)    Calcium 8.7 (*)    All other components within normal limits  PROTIME-INR - Abnormal; Notable for the following:    Prothrombin Time 24.9 (*)    INR 2.28 (*)    All other components within normal limits  I-STAT TROPOININ, ED    Imaging Review Dg Chest 2 View  11/04/2014   CLINICAL DATA:  Worsening shortness of breath for 2-3 weeks. COPD. Hypertension.  EXAM: CHEST  2 VIEW  COMPARISON:  07/20/2014  FINDINGS: Dual lead pacer remains in place.  Atherosclerotic calcification of the aortic arch with mild aortic tortuosity.  Emphysema. Heart size normal. Abnormal interstitial accentuation at the lung bases, increased from prior, reticular  and indistinct.  No pleural effusion.  No overt airspace opacity.  IMPRESSION: 1. Increased in reticular interstitial opacity at the lung bases, potentially from noncardiogenic edema, drug reaction, or less likely atypical infectious process. 2. Atherosclerotic aortic arch. 3. Emphysema.   Electronically Signed   By: Van Clines M.D.   On: 11/04/2014 16:17     EKG Interpretation   Date/Time:  Saturday November 04 2014 13:29:54 EDT Ventricular Rate:  125 PR Interval:    QRS Duration: 90 QT Interval:  312 QTC Calculation: 450 R Axis:   51 Text Interpretation:  Atrial fibrillation with rapid ventricular response  Abnormal ECG Atrial fibrillation Confirmed by Wyvonnia Dusky  MD, STEPHEN ([id number])  on 11/04/2014 2:49:16 PM       2:53 PM Patient seen and examined. Work-up initiated. Medications ordered. Discussed with Dr. Wyvonnia Dusky who will see.   Vital signs reviewed and are as follows: BP 117/76 mmHg  Pulse 125  Temp(Src) 97.7 F (36.5 C) (Oral)  Resp 18  Ht 5\' 9"  (1.753 m)  Wt 188 lb (85.276 kg)  BMI 27.75 kg/m2  SpO2 92%  3:37 PM Patient seen by Dr. Wyvonnia Dusky. Spoke with Dr. Mare Ferrari and requested cardiology consult. Dispo decision TBD.   4:45 PM Dr. Marlou Porch has seen. Will admit.   MDM   Final diagnoses:  Atrial fibrillation with RVR   Admit.    Carlisle Cater, PA-C 11/04/14 [place], MD 11/04/14 [phone]

## 2014-11-05 LAB — CBC
HEMATOCRIT: 35.8 % — AB (ref 39.0–52.0)
HEMOGLOBIN: 11.8 g/dL — AB (ref 13.0–17.0)
MCH: 30.2 pg (ref 26.0–34.0)
MCHC: 33 g/dL (ref 30.0–36.0)
MCV: 91.6 fL (ref 78.0–100.0)
Platelets: 370 10*3/uL (ref 150–400)
RBC: 3.91 MIL/uL — ABNORMAL LOW (ref 4.22–5.81)
RDW: 15.3 % (ref 11.5–15.5)
WBC: 8.7 10*3/uL (ref 4.0–10.5)

## 2014-11-05 LAB — BASIC METABOLIC PANEL
ANION GAP: 6 (ref 5–15)
BUN: 11 mg/dL (ref 6–20)
CALCIUM: 8.5 mg/dL — AB (ref 8.9–10.3)
CHLORIDE: 104 mmol/L (ref 101–111)
CO2: 28 mmol/L (ref 22–32)
Creatinine, Ser: 0.84 mg/dL (ref 0.61–1.24)
GFR calc non Af Amer: >60 mL/min (ref >60)
Glucose, Bld: 107 mg/dL — ABNORMAL HIGH (ref 65–99)
Potassium: 4 mmol/L (ref 3.5–5.1)
Sodium: 138 mmol/L (ref 135–145)

## 2014-11-05 MED ORDER — AMIODARONE HCL 200 MG PO TABS
200.0000 mg | ORAL_TABLET | Freq: Two times a day (BID) | ORAL | Status: DC
  Administered 2014-11-05 – 2014-11-06 (×3): 200 mg via ORAL
  Filled 2014-11-05 (×3): qty 1

## 2014-11-05 NOTE — Progress Notes (Signed)
Subjective:  Feeling better today.  Continues in atrial fibrillation.  Objective:  Vital Signs in the last 24 hours: BP 124/58 mmHg  Pulse 69  Temp(Src) 97.7 F (36.5 C) (Oral)  Resp 18  Ht 5' 9.5" (1.765 m)  Wt 83.19 kg (183 lb 6.4 oz)  BMI 26.70 kg/m2  SpO2 98%  Physical Exam: Pleasant male in no acute distress Lungs:  Clear Cardiac:  Irregular rhythm, normal S1 and S2, no S3 Extremities:  No edema present  Intake/Output from previous day: 07/09 0701 - 07/10 0700 In: 120 [P.O.:120] Out: 625 [Urine:625]  Weight Filed Weights   11/04/14 1328 11/04/14 1930 11/05/14 0400  Weight: 85.276 kg (188 lb) 83.462 kg (184 lb) 83.19 kg (183 lb 6.4 oz)    Lab Results: Basic Metabolic Panel:  Recent Labs  11/04/14 1334 11/05/14 0330  NA 137 138  K 4.2 4.0  CL 102 104  CO2 27 28  GLUCOSE 113* 107*  BUN 11 11  CREATININE 0.88 0.84   CBC:  Recent Labs  11/04/14 1334 11/05/14 0330  WBC 9.4 8.7  HGB 12.9* 11.8*  HCT 38.9* 35.8*  MCV 92.0 91.6  PLT 420* 370   Cardiac Enzymes: Troponin (Point of Care Test)  Recent Labs  11/04/14 1426  TROPIPOC 0.01   Telemetry: Persistent atrial fibrillation.  Some atrial under sensing noted.  Assessment/Plan:  1.  Paroxysmal atrial fibrillation 2.  Long-term use of warfarin currently therapeutic 3.  Coronary artery disease 4.  Atrial under sensing noted on pacemaker-will need evaluation this admission.  Recommendations:  Cardioversion is planned for tomorrow.  Keep nothing by mouth after midnight.  Cardioversion risk discussed with patient and his agreeable to proceed.  We'll go ahead and start some amiodarone since he did have some atrial fibrillation burden on his previous pacemaker before.  Continue diltiazem overnight for rate control.     Kerry Hough  MD [facility] Card[facility]  11/05/2014, 9:54 AM

## 2014-11-05 NOTE — Progress Notes (Addendum)
ANTICOAGULATION CONSULT NOTE - Follow Up Consult  Pharmacy Consult for warfarin Indication: atrial fibrillation  Allergies  Allergen Reactions  . Simvastatin Other (See Comments)    Unknown reaction  . Penicillins Hives, Swelling and Rash    Patient Measurements: Height: 5' 9.5" (176.5 cm) Weight: 183 lb 6.4 oz (83.19 kg) IBW/kg (Calculated) : 71.85  Vital Signs: Temp: 97.9 F (36.6 C) (07/10 1226) Temp Source: Oral (07/10 1226) BP: 117/84 mmHg (07/10 1226) Pulse Rate: 90 (07/10 1226)  Labs:  Recent Labs  11/04/14 1334 11/05/14 0330  HGB 12.9* 11.8*  HCT 38.9* 35.8*  PLT 420* 370  LABPROT 24.9*  --   INR 2.28*  --   CREATININE 0.88 0.84    Estimated Creatinine Clearance: 67.8 mL/min (by C-G formula based on Cr of 0.84).   Medications:  Prescriptions prior to admission  Medication Sig Dispense Refill Last Dose  . acetaminophen (TYLENOL) 500 MG tablet Take 500 mg by mouth daily.    11/04/2014 at Unknown time  . albuterol (PROVENTIL HFA;VENTOLIN HFA) 108 (90 BASE) MCG/ACT inhaler Inhale 2 puffs into the lungs every 4 (four) hours as needed for wheezing or shortness of breath. ProAir   few days ago  . Ascorbic Acid (VITAMIN C) 1000 MG tablet Take 1,000 mg by mouth daily.   11/04/2014 at Unknown time  . aspirin EC 81 MG tablet Take 81 mg by mouth daily.   11/04/2014 at Unknown time  . b complex vitamins capsule Take 1 capsule by mouth daily.   11/04/2014 at Unknown time  . budesonide-formoterol (SYMBICORT) 160-4.5 MCG/ACT inhaler Inhale 1-2 puffs into the lungs 2 (two) times daily. Inhale 2 puffs every morning and 1 puff at bedtime   11/04/2014 at am  . Cholecalciferol (VITAMIN D) 2000 UNITS tablet Take 2,000 Units by mouth daily.   11/04/2014 at Unknown time  . diltiazem (CARTIA XT) 120 MG 24 hr capsule Take 1 capsule (120 mg total) by mouth daily. (Patient taking differently: Take 120 mg by mouth at bedtime. ) 90 capsule 2 11/03/2014 at Unknown time  . docusate sodium (COLACE) 100  MG capsule Take 100 mg by mouth at bedtime as needed (constipation).   few days ago at Unknown time  . Esomeprazole Magnesium (NEXIUM PO) Take 44.6 mg by mouth daily.   11/04/2014 at Unknown time  . finasteride (PROSCAR) 5 MG tablet Take 5 mg by mouth at bedtime.    11/03/2014 at Unknown time  . furosemide (LASIX) 20 MG tablet Take 20 mg by mouth daily as needed for fluid or edema.    several weeks ago  . Multiple Vitamins-Minerals (HAIR/SKIN/NAILS) TABS Take 2 tablets by mouth daily.   11/04/2014 at Unknown time  . nebivolol (BYSTOLIC) 5 MG tablet Take 1 tablet (5 mg total) by mouth daily. (Patient taking differently: Take 5 mg by mouth at bedtime. ) 30 tablet 10 11/03/2014 at 1730-1800  . neomycin-bacitracin-polymyxin (NEOSPORIN) OINT Apply 1 application topically daily as needed for wound care.   11/03/2014 at Unknown time  . nitroGLYCERIN (NITROSTAT) 0.4 MG SL tablet Place 0.4 mg under the tongue every 5 (five) minutes as needed for chest pain.    never used  . Omega-3 Fatty Acids (FISH OIL) 1200 MG CAPS Take 2,400 mg by mouth daily.   11/04/2014 at Unknown time  . rosuvastatin (CRESTOR) 5 MG tablet Take 5 mg by mouth 3 (three) times a week. On Monday, Wednesday, and Friday nights   11/03/2014 at Unknown time  . senna-docusate (  SENNA-S) 8.6-50 MG per tablet Take 1 tablet by mouth at bedtime.   11/03/2014 at Unknown time  . temazepam (RESTORIL) 30 MG capsule Take 30 mg by mouth at bedtime.   11/03/2014 at Unknown time  . vitamin E 400 UNIT capsule Take 400 Units by mouth daily.    11/04/2014 at Unknown time  . warfarin (COUMADIN) 5 MG tablet TAKE 1 TABLET BY MOUTH DAILY OR AS DIRECTED BY COUMADIN CLINIC (Patient taking differently: Take 2.5-5 mg by mouth at bedtime. Take 1/2 tablet (2.5 mg) on Wednesday and Saturday, take 1 tablet (5 mg) on Sunday, Monday, Tuesday, Thursday, Friday OR AS DIRECTED BY COUMADIN CLINIC) 90 tablet 1 11/03/2014 at Unknown time    Assessment:  81 y/o M presents with SOB, palpitations, and  weakness. He has h/o afib on Coumadin PTA for afib. Continued home warfarin dose.  PTA warfarin: Admit INR 2.28 on 5mg  MTThF,Sun and 2.5mg  on Wed/Sat.  Goal of Therapy:  INR 2-3 Monitor platelets by anticoagulation protocol: Yes   Plan:  Warfarin 5mg  MTThF,Sun and 2.5 mg Wed/Sat No INR today but will check daily INR tomorrow  Angela Burke, PharmD Pharmacy Resident 11/05/2014,1:46 PM

## 2014-11-06 ENCOUNTER — Encounter: Payer: Self-pay | Admitting: Cardiovascular Disease

## 2014-11-06 ENCOUNTER — Inpatient Hospital Stay (HOSPITAL_COMMUNITY): Payer: PPO | Admitting: Anesthesiology

## 2014-11-06 ENCOUNTER — Encounter (HOSPITAL_COMMUNITY)
Admission: EM | Disposition: A | Discharge status: Home / Self Care | Payer: Self-pay | Source: Home / Self Care | Attending: Cardiovascular Disease

## 2014-11-06 ENCOUNTER — Encounter (HOSPITAL_COMMUNITY): Payer: Self-pay | Admitting: *Deleted

## 2014-11-06 HISTORY — PX: CARDIOVERSION: SHX1299

## 2014-11-06 LAB — PROTIME-INR
INR: 2.16 — ABNORMAL HIGH (ref 0.00–1.49)
Prothrombin Time: 23.9 seconds — ABNORMAL HIGH (ref 11.6–15.2)

## 2014-11-06 SURGERY — CARDIOVERSION
Anesthesia: General

## 2014-11-06 MED ORDER — SODIUM CHLORIDE 0.45 % IV SOLN: INTRAVENOUS | Status: DC

## 2014-11-06 MED ORDER — AMIODARONE HCL 200 MG PO TABS: 200.0000 mg | ORAL_TABLET | Freq: Two times a day (BID) | ORAL | Status: DC

## 2014-11-06 MED ORDER — LEVALBUTEROL HCL 0.63 MG/3ML IN NEBU: 0.6300 mg | INHALATION_SOLUTION | Freq: Four times a day (QID) | RESPIRATORY_TRACT | Status: DC | PRN

## 2014-11-06 MED ORDER — DILTIAZEM HCL ER COATED BEADS 120 MG PO CP24: 120.0000 mg | ORAL_CAPSULE | Freq: Every day | ORAL | Status: DC

## 2014-11-06 MED ORDER — SODIUM CHLORIDE 0.9 % IV SOLN
INTRAVENOUS | Status: DC
  Administered 2014-11-06 (×2): 500 mL via INTRAVENOUS

## 2014-11-06 MED ORDER — HYDROMORPHONE HCL 1 MG/ML IJ SOLN: 0.2500 mg | INTRAMUSCULAR | Status: DC | PRN

## 2014-11-06 MED ORDER — ONDANSETRON HCL 4 MG/2ML IJ SOLN: 4.0000 mg | Freq: Once | INTRAMUSCULAR | Status: DC | PRN

## 2014-11-06 MED ORDER — MEPERIDINE HCL 25 MG/ML IJ SOLN: 6.2500 mg | INTRAMUSCULAR | Status: DC | PRN

## 2014-11-06 MED ORDER — LIDOCAINE HCL (CARDIAC) 20 MG/ML IV SOLN
INTRAVENOUS | Status: DC | PRN
  Administered 2014-11-06: 50 mg via INTRAVENOUS

## 2014-11-06 MED ORDER — LACTATED RINGERS IV SOLN
INTRAVENOUS | Status: DC | PRN
  Administered 2014-11-06: 09:00:00 via INTRAVENOUS

## 2014-11-06 MED ORDER — PROPOFOL 10 MG/ML IV BOLUS
INTRAVENOUS | Status: DC | PRN
  Administered 2014-11-06: 80 mg via INTRAVENOUS

## 2014-11-06 MED ORDER — HYDROCORTISONE 1 % EX CREA
1.0000 "application " | TOPICAL_CREAM | Freq: Three times a day (TID) | CUTANEOUS | Status: DC | PRN
  Filled 2014-11-06: qty 28

## 2014-11-06 MED ORDER — PHENYLEPHRINE HCL 10 MG/ML IJ SOLN
INTRAMUSCULAR | Status: DC | PRN
  Administered 2014-11-06: 80 ug via INTRAVENOUS

## 2014-11-06 NOTE — Anesthesia Preprocedure Evaluation (Addendum)
Anesthesia Evaluation  Patient identified by MRN, date of birth, ID band Patient awake    History of Anesthesia Complications Negative for: history of anesthetic complications  Airway Mallampati: I  TM Distance: >3 FB Neck ROM: Full    Dental   Pulmonary shortness of breath and with exertion, asthma , COPDformer smoker,    Pulmonary exam normal       Cardiovascular hypertension, Pt. on medications + CAD Normal cardiovascular exam    Neuro/Psych  Neuromuscular disease    GI/Hepatic hiatal hernia, PUD, GERD-  Medicated and Controlled,  Endo/Other    Renal/GU      Musculoskeletal  (+) Arthritis -,   Abdominal   Peds  Hematology   Anesthesia Other Findings   Reproductive/Obstetrics                            Anesthesia Physical Anesthesia Plan  ASA: III  Anesthesia Plan: General   Post-op Pain Management:    Induction: Intravenous  Airway Management Planned: Mask  Additional Equipment:   Intra-op Plan:   Post-operative Plan: Extubation in OR  Informed Consent: I have reviewed the patients History and Physical, chart, labs and discussed the procedure including the risks, benefits and alternatives for the proposed anesthesia with the patient or authorized representative who has indicated his/her understanding and acceptance.     Plan Discussed with: CRNA and Surgeon  Anesthesia Plan Comments:         Anesthesia Quick Evaluation

## 2014-11-06 NOTE — CV Procedure (Signed)
    CARDIOVERSION NOTE  Procedure: Electrical Cardioversion Indications:  Atrial Fibrillation  Procedure Details:  Consent: Risks of procedure as well as the alternatives and risks of each were explained to the (patient/caregiver).  Consent for procedure obtained.  Time Out: Verified patient identification, verified procedure, site/side was marked, verified correct patient position, special equipment/implants available, medications/allergies/relevent history reviewed, required imaging and test results available.  Performed  Patient placed on cardiac monitor, pulse oximetry, supplemental oxygen as necessary.  Sedation given: Propofol per anesthesia Pacer pads placed anterior and posterior chest.  Cardioverted 1 time(s).  Cardioverted at 150J biphasic.  Impression: Findings: Post procedure EKG shows: A-paced, V-paced Complications: None Patient did tolerate procedure well.  Plan: 1. Successful DCCV to A-paced, v-paced rhythm 2. Atrial undersensing noted and thresholds adjusted. 3. D/c diltiazem gtts and restart home cardizem QHS 4. Probably okay for d/c later today.  Time Spent Directly with the Patient:  30 minutes   [name], MD, [facility] Attending Cardiologist [place] 11/06/2014, 9:24 AM

## 2014-11-06 NOTE — H&P (Signed)
     INTERVAL PROCEDURE H&P  History and Physical Interval Note:  11/06/2014 8:59 AM  Steve Norris has presented today for their planned procedure. The various methods of treatment have been discussed with the patient and family. After consideration of risks, benefits and other options for treatment, the patient has consented to the procedure.  The patients' outpatient history has been reviewed, patient examined, and no change in status from most recent office note within the past 30 days. I have reviewed the patients' chart and labs and will proceed as planned. Questions were answered to the patient's satisfaction.   Steve Casino, MD, Steve Norris [facility] Attending Cardiologist [place] [facility] Steve Norris 11/06/2014, 8:59 AM

## 2014-11-06 NOTE — Progress Notes (Signed)
UR Completed Crystelle Ferrufino Graves-Bigelow, RN,BSN [phone]  

## 2014-11-06 NOTE — Progress Notes (Signed)
ANTICOAGULATION CONSULT NOTE - Follow Up Consult  Pharmacy Consult for warfarin Indication: atrial fibrillation  Allergies  Allergen Reactions  . Simvastatin Other (See Comments)    Unknown reaction  . Penicillins Hives, Swelling and Rash    Patient Measurements: Height: 5' 9.5" (176.5 cm) Weight: 183 lb 6.4 oz (83.19 kg) IBW/kg (Calculated) : 71.85  Vital Signs: Temp: 97.7 F (36.5 C) (07/11 1144) Temp Source: Oral (07/11 1144) BP: 100/52 mmHg (07/11 1144) Pulse Rate: 61 (07/11 1144)  Labs:  Recent Labs  11/04/14 1334 11/05/14 0330 11/06/14 0253  HGB 12.9* 11.8*  --   HCT 38.9* 35.8*  --   PLT 420* 370  --   LABPROT 24.9*  --  23.9*  INR 2.28*  --  2.16*  CREATININE 0.88 0.84  --     Estimated Creatinine Clearance: 67.8 mL/min (by C-G formula based on Cr of 0.84).   Medications:  Prescriptions prior to admission  Medication Sig Dispense Refill Last Dose  . acetaminophen (TYLENOL) 500 MG tablet Take 500 mg by mouth daily.    11/04/2014 at Unknown time  . albuterol (PROVENTIL HFA;VENTOLIN HFA) 108 (90 BASE) MCG/ACT inhaler Inhale 2 puffs into the lungs every 4 (four) hours as needed for wheezing or shortness of breath. ProAir   few days ago  . Ascorbic Acid (VITAMIN C) 1000 MG tablet Take 1,000 mg by mouth daily.   11/04/2014 at Unknown time  . aspirin EC 81 MG tablet Take 81 mg by mouth daily.   11/04/2014 at Unknown time  . b complex vitamins capsule Take 1 capsule by mouth daily.   11/04/2014 at Unknown time  . budesonide-formoterol (SYMBICORT) 160-4.5 MCG/ACT inhaler Inhale 1-2 puffs into the lungs 2 (two) times daily. Inhale 2 puffs every morning and 1 puff at bedtime   11/04/2014 at am  . Cholecalciferol (VITAMIN D) 2000 UNITS tablet Take 2,000 Units by mouth daily.   11/04/2014 at Unknown time  . diltiazem (CARTIA XT) 120 MG 24 hr capsule Take 1 capsule (120 mg total) by mouth daily. (Patient taking differently: Take 120 mg by mouth at bedtime. ) 90 capsule 2 11/03/2014 at  Unknown time  . docusate sodium (COLACE) 100 MG capsule Take 100 mg by mouth at bedtime as needed (constipation).   few days ago at Unknown time  . Esomeprazole Magnesium (NEXIUM PO) Take 44.6 mg by mouth daily.   11/04/2014 at Unknown time  . finasteride (PROSCAR) 5 MG tablet Take 5 mg by mouth at bedtime.    11/03/2014 at Unknown time  . furosemide (LASIX) 20 MG tablet Take 20 mg by mouth daily as needed for fluid or edema.    several weeks ago  . Multiple Vitamins-Minerals (HAIR/SKIN/NAILS) TABS Take 2 tablets by mouth daily.   11/04/2014 at Unknown time  . nebivolol (BYSTOLIC) 5 MG tablet Take 1 tablet (5 mg total) by mouth daily. (Patient taking differently: Take 5 mg by mouth at bedtime. ) 30 tablet 10 11/03/2014 at 1730-1800  . neomycin-bacitracin-polymyxin (NEOSPORIN) OINT Apply 1 application topically daily as needed for wound care.   11/03/2014 at Unknown time  . nitroGLYCERIN (NITROSTAT) 0.4 MG SL tablet Place 0.4 mg under the tongue every 5 (five) minutes as needed for chest pain.    never used  . Omega-3 Fatty Acids (FISH OIL) 1200 MG CAPS Take 2,400 mg by mouth daily.   11/04/2014 at Unknown time  . rosuvastatin (CRESTOR) 5 MG tablet Take 5 mg by mouth 3 (three) times a  week. On Monday, Wednesday, and Friday nights   11/03/2014 at Unknown time  . senna-docusate (SENNA-S) 8.6-50 MG per tablet Take 1 tablet by mouth at bedtime.   11/03/2014 at Unknown time  . temazepam (RESTORIL) 30 MG capsule Take 30 mg by mouth at bedtime.   11/03/2014 at Unknown time  . vitamin E 400 UNIT capsule Take 400 Units by mouth daily.    11/04/2014 at Unknown time  . warfarin (COUMADIN) 5 MG tablet TAKE 1 TABLET BY MOUTH DAILY OR AS DIRECTED BY COUMADIN CLINIC (Patient taking differently: Take 2.5-5 mg by mouth at bedtime. Take 1/2 tablet (2.5 mg) on Wednesday and Saturday, take 1 tablet (5 mg) on Sunday, Monday, Tuesday, Thursday, Friday OR AS DIRECTED BY COUMADIN CLINIC) 90 tablet 1 11/03/2014 at Unknown time    Assessment: 81  y/o M presents with SOB, palpitations, and weakness. He has h/o afib on Coumadin PTA for afib. INR 2.1.  PTA warfarin: 5mg  Mon/Tue/Thurs/Fri/Sun and 2.5mg  on Wed/Sat.  Goal of Therapy:  INR 2-3 Monitor platelets by anticoagulation protocol: Yes   Plan:  Resume home warfarin  Daily INR    Hughes Better, PharmD, BCPS Clinical Pharmacist Pager: [phone] 11/06/2014 12:43 PM

## 2014-11-06 NOTE — Transfer of Care (Signed)
Immediate Anesthesia Transfer of Care Note  Patient: Steve Norris  Procedure(s) Performed: Procedure(s): CARDIOVERSION (N/A)  Patient Location: Endoscopy Unit  Anesthesia Type:General  Level of Consciousness: awake, alert , oriented and patient cooperative  Airway & Oxygen Therapy: Patient Spontanous Breathing and Patient connected to nasal cannula oxygen  Post-op Assessment: Report given to RN and Post -op Vital signs reviewed and stable  Post vital signs: Reviewed and stable  Last Vitals:  Filed Vitals:   11/06/14 0814  BP:   Pulse: 92  Temp: 36.6 C  Resp: 21    Complications: No apparent anesthesia complications
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
## 2014-11-06 NOTE — Care Management (Signed)
Important Message  Patient Details  Name: Steve Norris MRN: [id number] Date of Birth: 1929/08/15   Medicare Important Message Given:  Yes-second notification given    Nathen May 11/06/2014, 2:05 PM

## 2014-11-06 NOTE — Progress Notes (Signed)
     SUBJECTIVE: No SOB or chest pain today.   BP 98/60 mmHg  Pulse 70  Temp(Src) 97.7 F (36.5 C) (Oral)  Resp 19  Ht 5' 9.5" (1.765 m)  Wt 183 lb 6.4 oz (83.19 kg)  BMI 26.70 kg/m2  SpO2 93%  Intake/Output Summary (Last 24 hours) at 11/06/14 0738 Last data filed at 11/06/14 0542  Gross per 24 hour  Intake    960 ml  Output   2200 ml  Net  -1240 ml    PHYSICAL EXAM General: Well developed, well nourished, in no acute distress. Alert and oriented x 3.  Psych:  Good affect, responds appropriately Neck: No JVD. No masses noted.  Lungs: Clear bilaterally with no wheezes or rhonci noted.  Heart: Irregular with no murmurs noted. Abdomen: Bowel sounds are present. Soft, non-tender.  Extremities: No lower extremity edema.   LABS: Basic Metabolic Panel:  Recent Labs  11/04/14 1334 11/05/14 0330  NA 137 138  K 4.2 4.0  CL 102 104  CO2 27 28  GLUCOSE 113* 107*  BUN 11 11  CREATININE 0.88 0.84  CALCIUM 8.7* 8.5*   CBC:  Recent Labs  11/04/14 1334 11/05/14 0330  WBC 9.4 8.7  HGB 12.9* 11.8*  HCT 38.9* 35.8*  MCV 92.0 91.6  PLT 420* 370   Current Meds: . amiodarone  200 mg Oral BID  . aspirin EC  81 mg Oral Daily  . budesonide-formoterol  1-2 puff Inhalation BID  . finasteride  5 mg Oral QHS  . nebivolol  5 mg Oral QHS  . pantoprazole  40 mg Oral Daily  . rosuvastatin  5 mg Oral Once per day on Mon Wed Fri  . senna-docusate  1 tablet Oral QHS  . sodium chloride  3 mL Intravenous Q12H  . temazepam  30 mg Oral QHS  . warfarin  2.5 mg Oral Once per day on Wed Sat  . warfarin  5 mg Oral Once per day on Sun Mon Tue Thu Fri  . Warfarin - Pharmacist Dosing Inpatient   Does not apply q1800     ASSESSMENT AND PLAN:  1. Atrial fib with RVR: rate is better controlled this am on cardizem drip but still in atrial fib. He has also been started on amiodarone. Plans per weekend team for DCCV today. This is planned at [facility]. Will ask for [employer] rep to interrogate his  pacemaker before he goes home today.   2. Long-term use of warfarin:  currently therapeutic at 2.16 this am.   3. Coronary artery disease: stable  Dispo: likely d/c home today after his DCCV    MCALHANY,CHRISTOPHER  7/11/20167:38 AM

## 2014-11-06 NOTE — Anesthesia Postprocedure Evaluation (Signed)
  Anesthesia Post-op Note  Patient: Steve Norris  Procedure(s) Performed: Procedure(s): CARDIOVERSION (N/A)  Patient Location: Endoscopy Unit  Anesthesia Type:General  Level of Consciousness: awake, alert , oriented and patient cooperative  Airway and Oxygen Therapy: Patient Spontanous Breathing  Post-op Pain: none  Post-op Assessment: Post-op Vital signs reviewed, Patient's Cardiovascular Status Stable, Respiratory Function Stable, Patent Airway, No signs of Nausea or vomiting, Adequate PO intake, Pain level controlled and No headache              Post-op Vital Signs: Reviewed and stable  Last Vitals:  Filed Vitals:   11/06/14 0814  BP:   Pulse: 92  Temp: 36.6 C  Resp: 21    Complications: No apparent anesthesia complications

## 2014-11-06 NOTE — Discharge Summary (Signed)
CARDIOLOGY DISCHARGE SUMMARY   Patient ID: Steve Norris MRN: [id number] DOB/AGE: 79-07-1929 79 y.o.  Admit date: 11/04/2014 Discharge date: 11/06/2014  PCP: Tommy Medal, MD Primary Cardiologist: Dr Sallyanne Kuster  Primary Discharge Diagnosis:  Atrial fibrillation, rapid ventricular response Secondary Discharge Diagnosis:    CAD (coronary artery disease)   S/P placement of cardiac pacemaker - [employer]   Long term current use of anticoagulant therapy - Coumadin   Dyspnea  Procedures: Biphasic Cardioversion  Hospital Course: Steve Norris is a 81 y.o. 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 He developed increasing shortness of breath and came to the emergency room where he was in atrial fibrillation with rapid ventricular response. He was admitted for further evaluation and treatment.  He was initially started on IV Cardizem for rate control. It was felt he would benefit from an anti-rhythmic and he was started on oral amiodarone. He was continued on his home dose of nevibolol. His INR was therapeutic and had been for over 3 weeks.  On 11/06/2014, he was seen by Dr. Angelena Form and had cardioversion by Dr. Debara Pickett. The cardioversion was successful and he is currently in a paced, V paced rhythm. Atrial under sensing was noted and thresholds adjusted. His diltiazem drip was discontinued and he is to go back on his home dose of oral Cardizem.  No further inpatient workup is indicated and he is considered stable for discharge, to follow up as an outpatient.  Labs:   Lab Results  Component Value Date   WBC 8.7 11/05/2014   HGB 11.8* 11/05/2014   HCT 35.8* 11/05/2014   MCV 91.6 11/05/2014   PLT 370 11/05/2014     Recent Labs Lab 11/05/14 0330  NA 138  K 4.0  CL 104  CO2 28  BUN 11  CREATININE 0.84  CALCIUM 8.5*  GLUCOSE 107*    Recent Labs  11/06/14 0253  INR 2.16*      Radiology: Dg Chest 2  View 11/04/2014   CLINICAL DATA:  Worsening shortness of breath for 2-3 weeks. COPD. Hypertension.  EXAM: CHEST  2 VIEW  COMPARISON:  07/20/2014  FINDINGS: Dual lead pacer remains in place.  Atherosclerotic calcification of the aortic arch with mild aortic tortuosity.  Emphysema. Heart size normal. Abnormal interstitial accentuation at the lung bases, increased from prior, reticular and indistinct.  No pleural effusion.  No overt airspace opacity.  IMPRESSION: 1. Increased in reticular interstitial opacity at the lung bases, potentially from noncardiogenic edema, drug reaction, or less likely atypical infectious process. 2. Atherosclerotic aortic arch. 3. Emphysema.   Electronically Signed   By: Van Clines M.D.   On: 11/04/2014 16:17   EKG: 11/04/2014 Atrial fibrillation, rapid ventricular response  FOLLOW UP PLANS AND APPOINTMENTS Allergies  Allergen Reactions  . Simvastatin Other (See Comments)    Unknown reaction  . Penicillins Hives, Swelling and Rash     Medication List    TAKE these medications        acetaminophen 500 MG tablet  Commonly known as:  TYLENOL  Take 500 mg by mouth daily.     albuterol 108 (90 BASE) MCG/ACT inhaler  Commonly known as:  PROVENTIL HFA;VENTOLIN HFA  Inhale 2 puffs into the lungs every 4 (four) hours as needed for wheezing or shortness of breath. ProAir     amiodarone 200 MG tablet  Commonly known as:  PACERONE  Take  1 tablet (200 mg total) by mouth 2 (two) times daily. Take 1 tablet bid x 3 weeks, then decrease to 1 tablet daily     aspirin EC 81 MG tablet  Take 81 mg by mouth daily.     b complex vitamins capsule  Take 1 capsule by mouth daily.     budesonide-formoterol 160-4.5 MCG/ACT inhaler  Commonly known as:  SYMBICORT  Inhale 1-2 puffs into the lungs 2 (two) times daily. Inhale 2 puffs every morning and 1 puff at bedtime     diltiazem 120 MG 24 hr capsule  Commonly known as:  CARTIA XT  Take 1 capsule (120 mg total) by mouth  daily.     docusate sodium 100 MG capsule  Commonly known as:  COLACE  Take 100 mg by mouth at bedtime as needed (constipation).     finasteride 5 MG tablet  Commonly known as:  PROSCAR  Take 5 mg by mouth at bedtime.     Fish Oil 1200 MG Caps  Take 2,400 mg by mouth daily.     furosemide 20 MG tablet  Commonly known as:  LASIX  Take 20 mg by mouth daily as needed for fluid or edema.     HAIR/SKIN/NAILS Tabs  Take 2 tablets by mouth daily.     nebivolol 5 MG tablet  Commonly known as:  BYSTOLIC  Take 1 tablet (5 mg total) by mouth daily.     neomycin-bacitracin-polymyxin Oint  Commonly known as:  NEOSPORIN  Apply 1 application topically daily as needed for wound care.     NEXIUM PO  Take 44.6 mg by mouth daily.     nitroGLYCERIN 0.4 MG SL tablet  Commonly known as:  NITROSTAT  Place 0.4 mg under the tongue every 5 (five) minutes as needed for chest pain.     rosuvastatin 5 MG tablet  Commonly known as:  CRESTOR  Take 5 mg by mouth 3 (three) times a week. On Monday, Wednesday, and Friday nights     SENNA-S 8.6-50 MG per tablet  Generic drug:  senna-docusate  Take 1 tablet by mouth at bedtime.     temazepam 30 MG capsule  Commonly known as:  RESTORIL  Take 30 mg by mouth at bedtime.     vitamin C 1000 MG tablet  Take 1,000 mg by mouth daily.     Vitamin D 2000 UNITS tablet  Take 2,000 Units by mouth daily.     vitamin E 400 UNIT capsule  Take 400 Units by mouth daily.     warfarin 5 MG tablet  Commonly known as:  COUMADIN  TAKE 1 TABLET BY MOUTH DAILY OR AS DIRECTED BY COUMADIN CLINIC  Notes to Patient:  Take 1/2 tablet (2.5 mg) on Wednesday and Saturday, take 1 tablet (5 mg) on Sunday, Monday, Tuesday, Thursday, Friday OR AS DIRECTED BY COUMADIN CLINIC        Discharge Instructions    Diet - low sodium heart healthy    Complete by:  As directed      Increase activity slowly    Complete by:  As directed           Follow-up Information    Follow  up with Lyda Jester, PA-C On 12/06/2014.   Specialties:  Cardiology, Radiology   Why:  See provider at 2:00 pm, please arrive 15 minutes early for paperwork.   Contact information:   [address] [place] [id number] [phone]  BRING ALL MEDICATIONS WITH YOU TO FOLLOW UP APPOINTMENTS  Time spent with patient to include physician time: 55 min Signed: Rosaria Ferries, PA-C 11/06/2014, 4:30 PM Co-Sign MD

## 2014-11-07 ENCOUNTER — Encounter (HOSPITAL_COMMUNITY): Payer: Self-pay | Admitting: Internal Medicine

## 2014-11-07 ENCOUNTER — Telehealth: Payer: Self-pay | Admitting: Cardiology

## 2014-11-07 NOTE — Telephone Encounter (Signed)
Pt called and he was feeling better but still a little weak and is waiting for scheduling to call him about his appointment on the 10th of august

## 2014-11-07 NOTE — Telephone Encounter (Signed)
D/C phone call .Marland Kitchen appt on 12/06/14 w/ Ellen Henri    Thanks

## 2014-11-08 ENCOUNTER — Telehealth: Payer: Self-pay | Admitting: Cardiology

## 2014-11-08 NOTE — Telephone Encounter (Signed)
Closed encounter °

## 2014-11-27 ENCOUNTER — Telehealth: Payer: Self-pay | Admitting: Cardiovascular Disease

## 2014-11-27 NOTE — Telephone Encounter (Signed)
Called today to let MD know that he stopped taking Amiodarone yesterday because it was causing him to feel weak, dizzy and short of breath (more so than typical)  He had reduced his dose one week ahead of time to one daily. He attributes to how he feels to the Amiodarone and wants to know if there is anything else that he can take  He continues on Cardizem 120 mg daily  His Heart rate is 60 at rest  Told him I would send the message to Dr. Sallyanne Kuster  I explained to him what the medication was used for  I advised him that if he had any increased shortness of breath, chest pain, rapid heart rate he was to see medical care immediately.  Remains on Coumadin, dosage adjusted  Last week when INR came back at 4.2

## 2014-11-27 NOTE — Telephone Encounter (Signed)
Pt called in stating that he had gone in to Afib a week or so ago and he was put on Amiodarone. He states that since then he has been feeling dizzy and SOB. Since he didn't like the way the medication was making him feel he stopped taking the medication. He would like to know if there is another medication he can take to regulate his heart rate. Please call  Thanks

## 2014-11-28 ENCOUNTER — Encounter: Payer: Self-pay | Admitting: Cardiovascular Disease

## 2014-11-28 NOTE — Telephone Encounter (Signed)
Please ask him to do another download.

## 2014-11-28 NOTE — Telephone Encounter (Signed)
Can this encounter be closed?

## 2014-11-28 NOTE — Telephone Encounter (Signed)
Informed patient to send remote. Patient voiced understanding.

## 2014-12-06 ENCOUNTER — Ambulatory Visit (INDEPENDENT_AMBULATORY_CARE_PROVIDER_SITE_OTHER): Payer: PPO | Admitting: Pharmacist Clinician (PhC)/ Clinical Pharmacy Specialist

## 2014-12-06 ENCOUNTER — Encounter: Payer: Self-pay | Admitting: Cardiology

## 2014-12-06 ENCOUNTER — Ambulatory Visit (INDEPENDENT_AMBULATORY_CARE_PROVIDER_SITE_OTHER): Payer: PPO | Admitting: Cardiology

## 2014-12-06 VITALS — BP 136/72 | HR 67 | Ht 69.5 in | Wt 187.9 lb

## 2014-12-06 DIAGNOSIS — I4891 Unspecified atrial fibrillation: Secondary | ICD-10-CM

## 2014-12-06 DIAGNOSIS — Z7901 Long term (current) use of anticoagulants: Secondary | ICD-10-CM

## 2014-12-06 LAB — POCT INR: INR: 2.6

## 2014-12-06 NOTE — Progress Notes (Signed)
12/06/2014 [name]   1929/12/05  [id number]  Primary Physician Tommy Medal, MD Primary Cardiologist: Dr. Sallyanne Kuster Electrophysiologist: N/A  Reason for Visit/CC: [facility] F/U For [place]  HPI:  Steve Norris is a 81 y.o. 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 He developed increasing shortness of breath and came to the emergency room on 11/04/14 where he was in atrial fibrillation with rapid ventricular response. He was admitted for further evaluation and treatment. TSH was normal and enzymes negative.   He was initially started on IV Cardizem for rate control. It was felt he would benefit from an anti-rhythmic and he was started on oral amiodarone. He was continued on his home dose of nevibolol. His INR was therapeutic and had been for over 3 weeks. On 11/06/14, he underwent successful DCCV by Dr. Debara Pickett. His diltiazem drip was discontinued and he was transitioned back to his home dose of oral Cardizem.  He presents back to clinic today for post hospital f/u. He denies any symptoms of breakthrough atrial fibrillation. He also denies chest pain and dyspnea. He self discontinued amiodarone 2 weeks ago due to intolerance (weakness, fatigue). Symptoms resolved. He has been fully compliant with warfarin. No abnormal bleeding or falls. Fully compliant with BB and CCB.     Current Outpatient Prescriptions  Medication Sig Dispense Refill  . acetaminophen (TYLENOL) 500 MG tablet Take 500 mg by mouth daily.     Marland Kitchen albuterol (PROVENTIL HFA;VENTOLIN HFA) 108 (90 BASE) MCG/ACT inhaler Inhale 2 puffs into the lungs every 4 (four) hours as needed for wheezing or shortness of breath. ProAir    . Ascorbic Acid (VITAMIN C) 1000 MG tablet Take 1,000 mg by mouth daily.    Marland Kitchen aspirin EC 81 MG tablet Take 81 mg by mouth daily.    Marland Kitchen b complex vitamins capsule Take 1 capsule by mouth daily.    . budesonide-formoterol  (SYMBICORT) 160-4.5 MCG/ACT inhaler Inhale 1-2 puffs into the lungs 2 (two) times daily. Inhale 2 puffs every morning and 1 puff at bedtime    . Cholecalciferol (VITAMIN D) 2000 UNITS tablet Take 2,000 Units by mouth daily.    Marland Kitchen diltiazem (CARTIA XT) 120 MG 24 hr capsule Take 1 capsule (120 mg total) by mouth daily. (Patient taking differently: Take 120 mg by mouth at bedtime. ) 90 capsule 2  . docusate sodium (COLACE) 100 MG capsule Take 100 mg by mouth at bedtime as needed (constipation).    . Esomeprazole Magnesium (NEXIUM PO) Take 44.6 mg by mouth daily.    . finasteride (PROSCAR) 5 MG tablet Take 5 mg by mouth at bedtime.     . furosemide (LASIX) 20 MG tablet Take 20 mg by mouth daily as needed for fluid or edema.     . Multiple Vitamins-Minerals (HAIR/SKIN/NAILS) TABS Take 2 tablets by mouth daily.    . nebivolol (BYSTOLIC) 5 MG tablet Take 1 tablet (5 mg total) by mouth daily. (Patient taking differently: Take 5 mg by mouth at bedtime. ) 30 tablet 10  . neomycin-bacitracin-polymyxin (NEOSPORIN) OINT Apply 1 application topically daily as needed for wound care.    . nitroGLYCERIN (NITROSTAT) 0.4 MG SL tablet Place 0.4 mg under the tongue every 5 (five) minutes as needed for chest pain.     . Omega-3 Fatty Acids (FISH OIL) 1200 MG CAPS Take 2,400 mg by mouth daily.    . rosuvastatin (CRESTOR)  5 MG tablet Take 5 mg by mouth 3 (three) times a week. On Monday, Wednesday, and Friday nights    . senna-docusate (SENNA-S) 8.6-50 MG per tablet Take 1 tablet by mouth at bedtime.    . temazepam (RESTORIL) 30 MG capsule Take 30 mg by mouth at bedtime.    . vitamin E 400 UNIT capsule Take 400 Units by mouth daily.     Marland Kitchen warfarin (COUMADIN) 5 MG tablet TAKE 1 TABLET BY MOUTH DAILY OR AS DIRECTED BY COUMADIN CLINIC (Patient taking differently: Take 2.5-5 mg by mouth at bedtime. Take 1/2 tablet (2.5 mg) on Wednesday, Saturday and Sunday, take 1 tablet (5 mg) on Monday, Tuesday, Thursday, Friday OR AS DIRECTED  BY COUMADIN CLINIC) 90 tablet 1  . amiodarone (PACERONE) 200 MG tablet Take 1 tablet (200 mg total) by mouth 2 (two) times daily. Take 1 tablet bid x 3 weeks, then decrease to 1 tablet daily (Patient not taking: Reported on 12/06/2014) 60 tablet 11   No current facility-administered medications for this visit.    Allergies  Allergen Reactions  . Simvastatin Other (See Comments)    Unknown reaction  . Penicillins Hives, Swelling and Rash    Social History   Social History  . Marital Status: Married    Spouse Name: N/A  . Number of Children: N/A  . Years of Education: N/A   Occupational History  . Retired     Social History Main Topics  . Smoking status: Former Research scientist (life sciences)  . Smokeless tobacco: Never Used     Comment: Quit smoking 1996  . Alcohol Use: No  . Drug Use: No  . Sexual Activity: Not on file   Other Topics Concern  . Not on file   Social History Narrative   Daily caffeine      Review of Systems: General: negative for chills, fever, night sweats or weight changes.  Cardiovascular: negative for chest pain, dyspnea on exertion, edema, orthopnea, palpitations, paroxysmal nocturnal dyspnea or shortness of breath Dermatological: negative for rash Respiratory: negative for cough or wheezing Urologic: negative for hematuria Abdominal: negative for nausea, vomiting, diarrhea, bright red blood per rectum, melena, or hematemesis Neurologic: negative for visual changes, syncope, or dizziness All other systems reviewed and are otherwise negative except as noted above.    Blood pressure 136/72, pulse 67, height 5' 9.5" (1.765 m), weight 187 lb 14.4 oz (85.231 kg).  General appearance: alert, cooperative and no distress Neck: no carotid bruit and no JVD Lungs: clear to auscultation bilaterally Heart: regular rate and rhythm, S1, S2 normal, no murmur, click, rub or gallop Extremities: no LEE Pulses: 2+ and symmetric Skin: warm and dry Neurologic: Grossly normal    EKG  SR. Atrial paced 67 bpm  ASSESSMENT AND PLAN:   1. PAF: s/p DCCV. He has maintained NSR w/o any symptoms of breakthrough afib. HR well controlled on BB and CCB. He has been intolerant to amiodarone and discontinued this 2 weeks ago. Continue warfarin for a/c.  2. CAD: stable w/o angina. Continue ASA, BB and statin.   3. Pacemaker: inserted for symptomatic bradycardia. EKG shows paced rhythm. Followed by Dr. Sallyanne Kuster.  4. Chronic Anticoagulation: on warfarin therapy. INR is therapeutic. Followed in our coumadin clinic.   PLAN  Keep f/u appointment with Dr. Sallyanne Kuster 08/2015. Continue remote PPM downloads. No change in medical therapy.   SIMMONS, BRITTAINY PA-C 12/06/2014 9:02 AM

## 2014-12-06 NOTE — Patient Instructions (Signed)
Your physician wants you to follow-up in: MAY 2017 [place] will receive a reminder letter in the mail two months in advance. If you don't receive a letter, please call our office to schedule the follow-up appointment.

## 2014-12-12 ENCOUNTER — Encounter: Payer: Self-pay | Admitting: Cardiovascular Disease

## 2014-12-12 ENCOUNTER — Ambulatory Visit (INDEPENDENT_AMBULATORY_CARE_PROVIDER_SITE_OTHER): Payer: PPO | Admitting: *Deleted

## 2014-12-12 DIAGNOSIS — I480 Paroxysmal atrial fibrillation: Secondary | ICD-10-CM

## 2014-12-12 NOTE — Progress Notes (Signed)
Remote pacemaker transmission.   

## 2014-12-18 LAB — CUP PACEART REMOTE DEVICE CHECK
Battery Remaining Longevity: 93 mo
Battery Remaining Percentage: 81 %
Brady Statistic AP VS Percent: 95 %
Brady Statistic AS VP Percent: 1 %
Date Time Interrogation Session: 20160816065810
Lead Channel Impedance Value: 360 Ohm
Lead Channel Pacing Threshold Amplitude: 0.75 V
Lead Channel Pacing Threshold Amplitude: 0.75 V
Lead Channel Setting Pacing Amplitude: 2.5 V
Lead Channel Setting Pacing Pulse Width: 0.4 ms
MDC IDC MSMT BATTERY VOLTAGE: 2.93 V
MDC IDC MSMT LEADCHNL RA PACING THRESHOLD PULSEWIDTH: 0.4 ms
MDC IDC MSMT LEADCHNL RA SENSING INTR AMPL: 1.8 mV
MDC IDC MSMT LEADCHNL RV IMPEDANCE VALUE: 490 Ohm
MDC IDC MSMT LEADCHNL RV PACING THRESHOLD PULSEWIDTH: 0.4 ms
MDC IDC MSMT LEADCHNL RV SENSING INTR AMPL: 12 mV
MDC IDC SET LEADCHNL RA PACING AMPLITUDE: 2 V
MDC IDC SET LEADCHNL RV SENSING SENSITIVITY: 2 mV
MDC IDC STAT BRADY AP VP PERCENT: 3.5 %
MDC IDC STAT BRADY AS VS PERCENT: 1 %
MDC IDC STAT BRADY RA PERCENT PACED: 98 %
MDC IDC STAT BRADY RV PERCENT PACED: 3.9 %
Pulse Gen Model: 2210
Pulse Gen Serial Number: 7124006

## 2014-12-22 ENCOUNTER — Encounter: Payer: Self-pay | Admitting: Cardiovascular Disease

## 2014-12-27 ENCOUNTER — Encounter: Payer: Self-pay | Admitting: Cardiology

## 2014-12-29 ENCOUNTER — Ambulatory Visit (INDEPENDENT_AMBULATORY_CARE_PROVIDER_SITE_OTHER): Payer: PPO | Admitting: Pharmacist Clinician (PhC)/ Clinical Pharmacy Specialist

## 2014-12-29 DIAGNOSIS — Z7901 Long term (current) use of anticoagulants: Secondary | ICD-10-CM

## 2014-12-29 DIAGNOSIS — I4891 Unspecified atrial fibrillation: Secondary | ICD-10-CM | POA: Diagnosis not present

## 2014-12-29 LAB — POCT INR: INR: 2.8

## 2015-01-14 ENCOUNTER — Other Ambulatory Visit: Payer: Self-pay | Admitting: Cardiovascular Disease

## 2015-01-15 NOTE — Telephone Encounter (Signed)
REFILL 

## 2015-01-26 ENCOUNTER — Ambulatory Visit (INDEPENDENT_AMBULATORY_CARE_PROVIDER_SITE_OTHER): Payer: PPO | Admitting: Pharmacist Clinician (PhC)/ Clinical Pharmacy Specialist

## 2015-01-26 DIAGNOSIS — Z7901 Long term (current) use of anticoagulants: Secondary | ICD-10-CM

## 2015-01-26 DIAGNOSIS — I4891 Unspecified atrial fibrillation: Secondary | ICD-10-CM

## 2015-01-26 LAB — POCT INR: INR: 3.3

## 2015-02-16 ENCOUNTER — Ambulatory Visit (INDEPENDENT_AMBULATORY_CARE_PROVIDER_SITE_OTHER): Payer: PPO | Admitting: Pharmacist Clinician (PhC)/ Clinical Pharmacy Specialist

## 2015-02-16 ENCOUNTER — Ambulatory Visit: Payer: PPO | Admitting: Pharmacist Clinician (PhC)/ Clinical Pharmacy Specialist

## 2015-02-16 DIAGNOSIS — Z7901 Long term (current) use of anticoagulants: Secondary | ICD-10-CM | POA: Diagnosis not present

## 2015-02-16 DIAGNOSIS — I4891 Unspecified atrial fibrillation: Secondary | ICD-10-CM | POA: Diagnosis not present

## 2015-02-16 LAB — POCT INR: INR: 2.5

## 2015-03-13 ENCOUNTER — Ambulatory Visit (INDEPENDENT_AMBULATORY_CARE_PROVIDER_SITE_OTHER): Payer: PPO | Admitting: *Deleted

## 2015-03-13 DIAGNOSIS — I4891 Unspecified atrial fibrillation: Secondary | ICD-10-CM | POA: Diagnosis not present

## 2015-03-13 NOTE — Progress Notes (Signed)
Remote pacemaker transmission.   

## 2015-03-19 ENCOUNTER — Other Ambulatory Visit: Payer: Self-pay

## 2015-03-19 MED ORDER — DILTIAZEM HCL ER COATED BEADS 120 MG PO CP24: 120.0000 mg | ORAL_CAPSULE | Freq: Every day | ORAL | Status: DC

## 2015-03-21 LAB — CUP PACEART REMOTE DEVICE CHECK
Battery Remaining Longevity: 82 mo
Battery Voltage: 2.92 V
Brady Statistic AP VP Percent: 4.4 %
Brady Statistic AP VS Percent: 94 %
Brady Statistic AS VP Percent: 1 %
Brady Statistic AS VS Percent: 1 %
Date Time Interrogation Session: 20161115072726
Implantable Lead Implant Date: 20110330
Implantable Lead Implant Date: 20110330
Implantable Lead Location: 753859
Implantable Lead Location: 753860
Lead Channel Impedance Value: 530 Ohm
Lead Channel Pacing Threshold Amplitude: 0.75 V
Lead Channel Pacing Threshold Pulse Width: 0.4 ms
Lead Channel Pacing Threshold Pulse Width: 0.4 ms
Lead Channel Sensing Intrinsic Amplitude: 12 mV
Lead Channel Setting Pacing Amplitude: 2 V
Lead Channel Setting Pacing Amplitude: 2.5 V
Lead Channel Setting Pacing Pulse Width: 0.4 ms
MDC IDC MSMT BATTERY REMAINING PERCENTAGE: 73 %
MDC IDC MSMT LEADCHNL RA IMPEDANCE VALUE: 360 Ohm
MDC IDC MSMT LEADCHNL RA PACING THRESHOLD AMPLITUDE: 0.75 V
MDC IDC MSMT LEADCHNL RA SENSING INTR AMPL: 2.3 mV
MDC IDC SET LEADCHNL RV SENSING SENSITIVITY: 2 mV
MDC IDC STAT BRADY RA PERCENT PACED: 95 %
MDC IDC STAT BRADY RV PERCENT PACED: 5.1 %
Pulse Gen Model: 2210
Pulse Gen Serial Number: 7124006

## 2015-03-28 ENCOUNTER — Encounter: Payer: Self-pay | Admitting: Cardiology

## 2015-03-28 ENCOUNTER — Ambulatory Visit (INDEPENDENT_AMBULATORY_CARE_PROVIDER_SITE_OTHER): Payer: PPO | Admitting: Pharmacist Clinician (PhC)/ Clinical Pharmacy Specialist

## 2015-03-28 DIAGNOSIS — I4891 Unspecified atrial fibrillation: Secondary | ICD-10-CM

## 2015-03-28 DIAGNOSIS — Z7901 Long term (current) use of anticoagulants: Secondary | ICD-10-CM

## 2015-03-28 LAB — POCT INR: INR: 2.5

## 2015-04-01 ENCOUNTER — Other Ambulatory Visit: Payer: Self-pay | Admitting: Cardiovascular Disease

## 2015-05-09 ENCOUNTER — Other Ambulatory Visit: Payer: Self-pay | Admitting: Pharmacist Clinician (PhC)/ Clinical Pharmacy Specialist

## 2015-05-09 ENCOUNTER — Ambulatory Visit (INDEPENDENT_AMBULATORY_CARE_PROVIDER_SITE_OTHER): Payer: PPO | Admitting: Pharmacist Clinician (PhC)/ Clinical Pharmacy Specialist

## 2015-05-09 DIAGNOSIS — Z7901 Long term (current) use of anticoagulants: Secondary | ICD-10-CM | POA: Diagnosis not present

## 2015-05-09 DIAGNOSIS — I4891 Unspecified atrial fibrillation: Secondary | ICD-10-CM

## 2015-05-09 LAB — POCT INR: INR: 2.5

## 2015-05-09 MED ORDER — NEBIVOLOL HCL 5 MG PO TABS: 5.0000 mg | ORAL_TABLET | Freq: Every day | ORAL | Status: DC

## 2015-05-09 MED ORDER — WARFARIN SODIUM 5 MG PO TABS: ORAL_TABLET | ORAL | Status: DC

## 2015-06-06 DIAGNOSIS — M549 Dorsalgia, unspecified: Secondary | ICD-10-CM | POA: Diagnosis not present

## 2015-06-06 DIAGNOSIS — M1711 Unilateral primary osteoarthritis, right knee: Secondary | ICD-10-CM | POA: Diagnosis not present

## 2015-06-06 DIAGNOSIS — M1712 Unilateral primary osteoarthritis, left knee: Secondary | ICD-10-CM | POA: Diagnosis not present

## 2015-06-12 ENCOUNTER — Ambulatory Visit (INDEPENDENT_AMBULATORY_CARE_PROVIDER_SITE_OTHER): Payer: PPO | Admitting: *Deleted

## 2015-06-12 DIAGNOSIS — I480 Paroxysmal atrial fibrillation: Secondary | ICD-10-CM

## 2015-06-12 NOTE — Progress Notes (Signed)
Remote pacemaker transmission.   

## 2015-06-13 ENCOUNTER — Encounter: Payer: Self-pay | Admitting: Cardiology

## 2015-06-13 LAB — CUP PACEART REMOTE DEVICE CHECK
Battery Remaining Longevity: 82 mo
Battery Remaining Percentage: 73 %
Battery Voltage: 2.92 V
Brady Statistic AP VP Percent: 4.1 %
Brady Statistic AS VP Percent: 1 %
Brady Statistic AS VS Percent: 1 %
Date Time Interrogation Session: 20170214075553
Implantable Lead Implant Date: 20110330
Implantable Lead Location: 753860
Lead Channel Impedance Value: 360 Ohm
Lead Channel Sensing Intrinsic Amplitude: 12 mV
Lead Channel Sensing Intrinsic Amplitude: 2.3 mV
Lead Channel Setting Pacing Amplitude: 2 V
Lead Channel Setting Pacing Amplitude: 2.5 V
Lead Channel Setting Pacing Pulse Width: 0.4 ms
Lead Channel Setting Sensing Sensitivity: 2 mV
MDC IDC LEAD IMPLANT DT: 20110330
MDC IDC LEAD LOCATION: 753859
MDC IDC MSMT LEADCHNL RV IMPEDANCE VALUE: 530 Ohm
MDC IDC STAT BRADY AP VS PERCENT: 94 %
MDC IDC STAT BRADY RA PERCENT PACED: 93 %
MDC IDC STAT BRADY RV PERCENT PACED: 4.7 %
Pulse Gen Serial Number: 7124006

## 2015-06-20 ENCOUNTER — Ambulatory Visit (INDEPENDENT_AMBULATORY_CARE_PROVIDER_SITE_OTHER): Payer: PPO | Admitting: Pharmacist Clinician (PhC)/ Clinical Pharmacy Specialist

## 2015-06-20 DIAGNOSIS — Z7901 Long term (current) use of anticoagulants: Secondary | ICD-10-CM

## 2015-06-20 DIAGNOSIS — I4891 Unspecified atrial fibrillation: Secondary | ICD-10-CM | POA: Diagnosis not present

## 2015-06-20 LAB — POCT INR: INR: 2.9

## 2015-06-30 DIAGNOSIS — M545 Low back pain: Secondary | ICD-10-CM | POA: Diagnosis not present

## 2015-06-30 DIAGNOSIS — M5442 Lumbago with sciatica, left side: Secondary | ICD-10-CM | POA: Diagnosis not present

## 2015-07-04 ENCOUNTER — Other Ambulatory Visit: Payer: Self-pay | Admitting: Orthopedic Surgery

## 2015-07-04 DIAGNOSIS — M5416 Radiculopathy, lumbar region: Secondary | ICD-10-CM

## 2015-07-09 ENCOUNTER — Ambulatory Visit
Admission: RE | Admit: 2015-07-09 | Discharge: 2015-07-09 | Disposition: A | Discharge status: Home / Self Care | Payer: PPO | Source: Ambulatory Visit | Attending: Orthopedic Surgery | Admitting: Orthopedic Surgery

## 2015-07-09 DIAGNOSIS — M5126 Other intervertebral disc displacement, lumbar region: Secondary | ICD-10-CM | POA: Diagnosis not present

## 2015-07-09 DIAGNOSIS — M5416 Radiculopathy, lumbar region: Secondary | ICD-10-CM

## 2015-07-10 DIAGNOSIS — M5416 Radiculopathy, lumbar region: Secondary | ICD-10-CM | POA: Diagnosis not present

## 2015-07-12 DIAGNOSIS — M5416 Radiculopathy, lumbar region: Secondary | ICD-10-CM | POA: Diagnosis not present

## 2015-07-12 DIAGNOSIS — M545 Low back pain: Secondary | ICD-10-CM | POA: Diagnosis not present

## 2015-07-18 DIAGNOSIS — M5416 Radiculopathy, lumbar region: Secondary | ICD-10-CM | POA: Diagnosis not present

## 2015-07-20 ENCOUNTER — Telehealth: Payer: Self-pay

## 2015-07-20 ENCOUNTER — Encounter: Payer: Self-pay | Admitting: Cardiovascular Disease

## 2015-07-20 NOTE — Telephone Encounter (Signed)
Faxed clearance letter to Dr Normajean Glasgow per Dr C.

## 2015-07-23 ENCOUNTER — Telehealth: Payer: Self-pay | Admitting: Cardiovascular Disease

## 2015-07-23 NOTE — Telephone Encounter (Signed)
Per office protocol, CHADS2 score or 1 (age) and CHADS2-VASc score of 3 (age x 2, CAD). Ok to hold warfarin x 5 days prior to epidural and restart evening of procedure.

## 2015-07-23 NOTE — Telephone Encounter (Signed)
Clearance sent 

## 2015-07-23 NOTE — Telephone Encounter (Signed)
Request for surgical clearance:  1. What type of surgery is being performed? Epidural Injection   2. When is this surgery scheduled? Not scheduled yet   3. Are there any medications that need to be held prior to surgery and how long? Hold coumadin 7 days prior and to start back right after procedure   4. Name of physician performing surgery? Dr. Silvestre Moment   5. What is your office phone and fax number?ph#[phone], [phone]

## 2015-07-24 DIAGNOSIS — F5101 Primary insomnia: Secondary | ICD-10-CM | POA: Diagnosis not present

## 2015-07-26 ENCOUNTER — Telehealth: Payer: Self-pay | Admitting: Cardiovascular Disease

## 2015-07-26 NOTE — Telephone Encounter (Signed)
She said she never received the fax for clearance,please refax asap. Fax [phone].

## 2015-07-26 NOTE — Telephone Encounter (Signed)
Letter re-sent

## 2015-07-30 ENCOUNTER — Ambulatory Visit (INDEPENDENT_AMBULATORY_CARE_PROVIDER_SITE_OTHER): Payer: PPO | Admitting: Pharmacist Clinician (PhC)/ Clinical Pharmacy Specialist

## 2015-07-30 DIAGNOSIS — I4891 Unspecified atrial fibrillation: Secondary | ICD-10-CM

## 2015-07-30 DIAGNOSIS — Z7901 Long term (current) use of anticoagulants: Secondary | ICD-10-CM | POA: Diagnosis not present

## 2015-07-30 LAB — POCT INR: INR: 1.3

## 2015-07-31 DIAGNOSIS — M5416 Radiculopathy, lumbar region: Secondary | ICD-10-CM | POA: Diagnosis not present

## 2015-08-01 ENCOUNTER — Telehealth: Payer: Self-pay | Admitting: Cardiology

## 2015-08-01 ENCOUNTER — Inpatient Hospital Stay (HOSPITAL_COMMUNITY)
Admission: AD | Admit: 2015-08-01 | Discharge: 2015-08-05 | DRG: 308 | Disposition: A | Discharge status: Home / Self Care | Payer: PPO | Source: Ambulatory Visit | Attending: Cardiovascular Disease | Admitting: Cardiovascular Disease

## 2015-08-01 ENCOUNTER — Telehealth: Payer: Self-pay | Admitting: Cardiovascular Disease

## 2015-08-01 ENCOUNTER — Ambulatory Visit (INDEPENDENT_AMBULATORY_CARE_PROVIDER_SITE_OTHER): Payer: PPO | Admitting: *Deleted

## 2015-08-01 VITALS — HR 146

## 2015-08-01 DIAGNOSIS — E785 Hyperlipidemia, unspecified: Secondary | ICD-10-CM | POA: Diagnosis present

## 2015-08-01 DIAGNOSIS — I480 Paroxysmal atrial fibrillation: Secondary | ICD-10-CM | POA: Diagnosis not present

## 2015-08-01 DIAGNOSIS — I340 Nonrheumatic mitral (valve) insufficiency: Secondary | ICD-10-CM | POA: Diagnosis not present

## 2015-08-01 DIAGNOSIS — I5033 Acute on chronic diastolic (congestive) heart failure: Secondary | ICD-10-CM | POA: Diagnosis present

## 2015-08-01 DIAGNOSIS — K219 Gastro-esophageal reflux disease without esophagitis: Secondary | ICD-10-CM | POA: Diagnosis present

## 2015-08-01 DIAGNOSIS — I110 Hypertensive heart disease with heart failure: Secondary | ICD-10-CM | POA: Diagnosis not present

## 2015-08-01 DIAGNOSIS — Z888 Allergy status to other drugs, medicaments and biological substances status: Secondary | ICD-10-CM

## 2015-08-01 DIAGNOSIS — Z955 Presence of coronary angioplasty implant and graft: Secondary | ICD-10-CM

## 2015-08-01 DIAGNOSIS — Z7901 Long term (current) use of anticoagulants: Secondary | ICD-10-CM

## 2015-08-01 DIAGNOSIS — J449 Chronic obstructive pulmonary disease, unspecified: Secondary | ICD-10-CM | POA: Diagnosis not present

## 2015-08-01 DIAGNOSIS — J440 Chronic obstructive pulmonary disease with acute lower respiratory infection: Secondary | ICD-10-CM | POA: Diagnosis not present

## 2015-08-01 DIAGNOSIS — I081 Rheumatic disorders of both mitral and tricuspid valves: Secondary | ICD-10-CM | POA: Diagnosis present

## 2015-08-01 DIAGNOSIS — K2270 Barrett's esophagus without dysplasia: Secondary | ICD-10-CM | POA: Diagnosis not present

## 2015-08-01 DIAGNOSIS — Z950 Presence of cardiac pacemaker: Secondary | ICD-10-CM

## 2015-08-01 DIAGNOSIS — J189 Pneumonia, unspecified organism: Secondary | ICD-10-CM | POA: Diagnosis not present

## 2015-08-01 DIAGNOSIS — I2510 Atherosclerotic heart disease of native coronary artery without angina pectoris: Secondary | ICD-10-CM | POA: Diagnosis not present

## 2015-08-01 DIAGNOSIS — Z79899 Other long term (current) drug therapy: Secondary | ICD-10-CM

## 2015-08-01 DIAGNOSIS — I4891 Unspecified atrial fibrillation: Secondary | ICD-10-CM | POA: Diagnosis not present

## 2015-08-01 DIAGNOSIS — Z8249 Family history of ischemic heart disease and other diseases of the circulatory system: Secondary | ICD-10-CM

## 2015-08-01 DIAGNOSIS — R062 Wheezing: Secondary | ICD-10-CM

## 2015-08-01 DIAGNOSIS — Z880 Allergy status to penicillin: Secondary | ICD-10-CM

## 2015-08-01 DIAGNOSIS — R609 Edema, unspecified: Secondary | ICD-10-CM | POA: Diagnosis not present

## 2015-08-01 DIAGNOSIS — Z7951 Long term (current) use of inhaled steroids: Secondary | ICD-10-CM

## 2015-08-01 LAB — BASIC METABOLIC PANEL
ANION GAP: 10 (ref 5–15)
BUN: 17 mg/dL (ref 6–20)
CALCIUM: 9.1 mg/dL (ref 8.9–10.3)
CO2: 23 mmol/L (ref 22–32)
Chloride: 100 mmol/L — ABNORMAL LOW (ref 101–111)
Creatinine, Ser: 0.75 mg/dL (ref 0.61–1.24)
GFR calc Af Amer: >60 mL/min (ref >60)
GFR calc non Af Amer: >60 mL/min (ref >60)
GLUCOSE: 132 mg/dL — AB (ref 65–99)
Potassium: 4.3 mmol/L (ref 3.5–5.1)
Sodium: 133 mmol/L — ABNORMAL LOW (ref 135–145)

## 2015-08-01 LAB — MAGNESIUM: Magnesium: 1.8 mg/dL (ref 1.7–2.4)

## 2015-08-01 LAB — CBC WITH DIFFERENTIAL/PLATELET
Basophils Absolute: 0 10*3/uL (ref 0.0–0.1)
Basophils Relative: 0 %
Eosinophils Absolute: 0 10*3/uL (ref 0.0–0.7)
Eosinophils Relative: 0 %
HEMATOCRIT: 31.5 % — AB (ref 39.0–52.0)
HEMOGLOBIN: 10.6 g/dL — AB (ref 13.0–17.0)
LYMPHS ABS: 1.7 10*3/uL (ref 0.7–4.0)
LYMPHS PCT: 13 %
MCH: 29.4 pg (ref 26.0–34.0)
MCHC: 33.7 g/dL (ref 30.0–36.0)
MCV: 87.3 fL (ref 78.0–100.0)
MONOS PCT: 16 %
Monocytes Absolute: 2.1 10*3/uL — ABNORMAL HIGH (ref 0.1–1.0)
NEUTROS ABS: 9.5 10*3/uL — AB (ref 1.7–7.7)
NEUTROS PCT: 71 %
Platelets: 393 10*3/uL (ref 150–400)
RBC: 3.61 MIL/uL — AB (ref 4.22–5.81)
RDW: 16.8 % — ABNORMAL HIGH (ref 11.5–15.5)
WBC: 13.3 10*3/uL — AB (ref 4.0–10.5)

## 2015-08-01 LAB — TSH: TSH: 0.609 u[IU]/mL (ref 0.350–4.500)

## 2015-08-01 MED ORDER — SODIUM CHLORIDE 0.9% FLUSH
3.0000 mL | Freq: Two times a day (BID) | INTRAVENOUS | Status: DC
  Administered 2015-08-01 – 2015-08-04 (×5): 3 mL via INTRAVENOUS

## 2015-08-01 MED ORDER — DILTIAZEM LOAD VIA INFUSION
10.0000 mg | Freq: Once | INTRAVENOUS | Status: AC
  Administered 2015-08-01: 10 mg via INTRAVENOUS
  Filled 2015-08-01: qty 10

## 2015-08-01 MED ORDER — HYDROCORTISONE 1 % EX CREA
1.0000 "application " | TOPICAL_CREAM | Freq: Three times a day (TID) | CUTANEOUS | Status: DC | PRN
  Filled 2015-08-01: qty 28

## 2015-08-01 MED ORDER — ASPIRIN EC 81 MG PO TBEC
81.0000 mg | DELAYED_RELEASE_TABLET | Freq: Every day | ORAL | Status: DC
  Administered 2015-08-02 – 2015-08-05 (×4): 81 mg via ORAL
  Filled 2015-08-01 (×4): qty 1

## 2015-08-01 MED ORDER — FUROSEMIDE 40 MG PO TABS: 40.0000 mg | ORAL_TABLET | Freq: Two times a day (BID) | ORAL | Status: DC

## 2015-08-01 MED ORDER — DEXTROSE 5 % IV SOLN
5.0000 mg/h | INTRAVENOUS | Status: DC
  Administered 2015-08-01: 5 mg/h via INTRAVENOUS
  Administered 2015-08-02: 10 mg/h via INTRAVENOUS
  Filled 2015-08-01 (×2): qty 100

## 2015-08-01 MED ORDER — ACETAMINOPHEN 500 MG PO TABS
500.0000 mg | ORAL_TABLET | Freq: Every day | ORAL | Status: DC | PRN
  Administered 2015-08-01 – 2015-08-04 (×3): 500 mg via ORAL
  Filled 2015-08-01 (×3): qty 1

## 2015-08-01 MED ORDER — FUROSEMIDE 40 MG PO TABS
40.0000 mg | ORAL_TABLET | Freq: Two times a day (BID) | ORAL | Status: DC
  Administered 2015-08-02 – 2015-08-03 (×4): 40 mg via ORAL
  Filled 2015-08-01 (×3): qty 1

## 2015-08-01 MED ORDER — SODIUM CHLORIDE 0.9 % IV SOLN: 250.0000 mL | INTRAVENOUS | Status: DC

## 2015-08-01 MED ORDER — HEPARIN (PORCINE) IN NACL 100-0.45 UNIT/ML-% IJ SOLN
1900.0000 [IU]/h | INTRAMUSCULAR | Status: DC
  Administered 2015-08-02: 1500 [IU]/h via INTRAVENOUS
  Administered 2015-08-02: 1250 [IU]/h via INTRAVENOUS
  Administered 2015-08-03: 1900 [IU]/h via INTRAVENOUS
  Filled 2015-08-01 (×3): qty 250

## 2015-08-01 MED ORDER — NITROGLYCERIN 0.4 MG SL SUBL: 0.4000 mg | SUBLINGUAL_TABLET | SUBLINGUAL | Status: DC | PRN

## 2015-08-01 MED ORDER — MOMETASONE FURO-FORMOTEROL FUM 200-5 MCG/ACT IN AERO
2.0000 | INHALATION_SPRAY | Freq: Two times a day (BID) | RESPIRATORY_TRACT | Status: DC
  Administered 2015-08-02 – 2015-08-05 (×6): 2 via RESPIRATORY_TRACT
  Filled 2015-08-01: qty 8.8

## 2015-08-01 MED ORDER — ROSUVASTATIN CALCIUM 10 MG PO TABS
5.0000 mg | ORAL_TABLET | ORAL | Status: DC
  Administered 2015-08-01 – 2015-08-03 (×2): 5 mg via ORAL
  Filled 2015-08-01 (×2): qty 1

## 2015-08-01 MED ORDER — WARFARIN SODIUM 5 MG PO TABS
5.0000 mg | ORAL_TABLET | Freq: Once | ORAL | Status: AC
  Administered 2015-08-01: 5 mg via ORAL
  Filled 2015-08-01: qty 1

## 2015-08-01 MED ORDER — CALCIUM CARBONATE ANTACID 500 MG PO CHEW
1.0000 | CHEWABLE_TABLET | ORAL | Status: DC | PRN
  Filled 2015-08-01: qty 1

## 2015-08-01 MED ORDER — WARFARIN - PHARMACIST DOSING INPATIENT
Freq: Every day | Status: DC
  Administered 2015-08-02 – 2015-08-03 (×2)

## 2015-08-01 MED ORDER — NEBIVOLOL HCL 5 MG PO TABS
5.0000 mg | ORAL_TABLET | Freq: Every day | ORAL | Status: DC
  Administered 2015-08-02 – 2015-08-05 (×4): 5 mg via ORAL
  Filled 2015-08-01 (×4): qty 1

## 2015-08-01 MED ORDER — PANTOPRAZOLE SODIUM 40 MG PO TBEC
40.0000 mg | DELAYED_RELEASE_TABLET | Freq: Every day | ORAL | Status: DC
  Administered 2015-08-02 – 2015-08-05 (×4): 40 mg via ORAL
  Filled 2015-08-01 (×4): qty 1

## 2015-08-01 MED ORDER — TEMAZEPAM 15 MG PO CAPS
30.0000 mg | ORAL_CAPSULE | Freq: Every day | ORAL | Status: DC
  Administered 2015-08-01 – 2015-08-04 (×4): 30 mg via ORAL
  Filled 2015-08-01 (×4): qty 2

## 2015-08-01 MED ORDER — FUROSEMIDE 40 MG PO TABS
40.0000 mg | ORAL_TABLET | Freq: Two times a day (BID) | ORAL | Status: DC
  Filled 2015-08-01: qty 1

## 2015-08-01 MED ORDER — FINASTERIDE 5 MG PO TABS
5.0000 mg | ORAL_TABLET | Freq: Every day | ORAL | Status: DC
  Administered 2015-08-01 – 2015-08-04 (×4): 5 mg via ORAL
  Filled 2015-08-01 (×4): qty 1

## 2015-08-01 MED ORDER — SODIUM CHLORIDE 0.9% FLUSH: 3.0000 mL | INTRAVENOUS | Status: DC | PRN

## 2015-08-01 MED ORDER — FUROSEMIDE 20 MG PO TABS: 20.0000 mg | ORAL_TABLET | Freq: Every day | ORAL | Status: DC | PRN

## 2015-08-01 MED ORDER — SODIUM CHLORIDE 0.9 % IV SOLN
INTRAVENOUS | Status: DC
  Administered 2015-08-01: 22:00:00 via INTRAVENOUS

## 2015-08-01 NOTE — Progress Notes (Signed)
Pt presented for EKG (see telephone notes today). A Fib w RVR (146 bpm). Dr. Sallyanne Kuster reviewed. Recommended for hospital admission d/t sustained RVR w/ plan for TEE cardioversion.   Patient set up for direct admission & TEE cardioversion.   Dr. Sallyanne Kuster has been in patient room and has discussed recommendations & admission w patient in detail.  Bed control contacted & will notify patient when bed ready. Patient left office and will return home before admission. Pt aware of instructions.

## 2015-08-01 NOTE — Progress Notes (Signed)
Pt arrived to 2W direct admit from home. Oriented to room, pt stable in Afib RVR. On tele. Will continue to monitor. IV team consult placed for IV.

## 2015-08-01 NOTE — Patient Instructions (Signed)
[facility] will contact you when bed is ready.  Please report as instructed to [facility]. Plan to stay overnight tonight and fast for your TEE w/ Cardioversion, which is scheduled for tomorrow.

## 2015-08-01 NOTE — Progress Notes (Signed)
ANTICOAGULATION CONSULT NOTE - Initial Consult  Pharmacy Consult for warfarin / heparin Indication: atrial fibrillation  Allergies  Allergen Reactions  . Amiodarone Nausea And Vomiting  . Simvastatin Other (See Comments)    Unknown reaction  . Penicillins Hives, Swelling and Rash    Patient Measurements: Height: 5\' 6"  (167.6 cm) Weight: 193 lb 8 oz (87.771 kg) IBW/kg (Calculated) : 63.8  Heparin dosing weight: 85 kg  Vital Signs: Temp: 97.6 F (36.4 C) (04/05 2012) Temp Source: Oral (04/05 2012) BP: 118/84 mmHg (04/05 2012) Pulse Rate: 143 (04/05 2012)  Labs:  Recent Labs  07/30/15 1357 08/01/15 2139  HGB  --  10.6*  HCT  --  31.5*  PLT  --  393  INR 1.3  --     CrCl cannot be calculated (Patient has no serum creatinine result on file.).   Medical History: Past Medical History  Diagnosis Date  . Hiatal hernia 03-19-2009    EGD  . Erosive esophagitis 03-19-2009    EGD  . Barrett's esophagus 03-19-2009    EGD  . CAD (coronary artery disease)   . Paroxysmal atrial fibrillation   . Internal hemorrhoids 03-19-2009    Colonoscopy  . Diverticulosis 03-19-2009    Colonoscopy   . Arthritis   . Asthma   . Cataract     REMOVED  . COPD (chronic obstructive pulmonary disease)   . GERD (gastroesophageal reflux disease)   . Hyperlipidemia   . Hypertension     Assessment: 38 yoM with hx of PAF on warfarin PTA; however warfarin held from 3/30 to 4/3 due to planned spinal epidural procedure. Per med rec warfarin resumed on 4/4 pm. Most recent INR 1.3 on 4/3. He is being admitted for further mngt of his afib and potential TEE with cardioversion on 4/6. Pharmacy to resume warfarin this evening, had recent spinal injection but cards ok with starting heparin.  PTA dose 2.5 mg on MWF and 5 mg AOD's.  Goal of Therapy:  INR 2-3 Monitor platelets by anticoagulation protocol: Yes   Plan:  Give Warfarin 5 mg x 1 this evening No heparin BOLUS Start heparin gtt at  1,250 units/hr Check 8 hr HL Monitor daily INR, CBC, s/s of bleed  Elenor Quinones, PharmD, [facility] Clinical Pharmacist Pager [phone] 08/01/2015 10:53 PM

## 2015-08-01 NOTE — H&P (Signed)
Patient ID: Steve Norris MRN: [id number], DOB/AGE: [birth year]   Admit date: 08/01/2015   Primary Physician: Tommy Medal, MD Primary Cardiologist: Dr. Sallyanne Kuster  Pt. Profile:  82 y/o male with h/o a history of PAF on Coumadin, bradycardia with St. Jude pacemaker, CAD with LAD and RCA stents, GERD and COPD, admitted for recurrent atrial fibrillation w/ RVR. Patient is s/p recent epidural injection 07/31/15, thus his Coumadin was held for several days and INR is sub therapeutic.   Problem List  Past Medical History  Diagnosis Date  . Hiatal hernia 03-19-2009    EGD  . Erosive esophagitis 03-19-2009    EGD  . Barrett's esophagus 03-19-2009    EGD  . CAD (coronary artery disease)   . Paroxysmal atrial fibrillation   . Internal hemorrhoids 03-19-2009    Colonoscopy  . Diverticulosis 03-19-2009    Colonoscopy   . Arthritis   . Asthma   . Cataract     REMOVED  . COPD (chronic obstructive pulmonary disease)   . GERD (gastroesophageal reflux disease)   . Hyperlipidemia   . Hypertension     Past Surgical History  Procedure Laterality Date  . Vasectomy    . Hemorrhoid surgery    . Tonsillectomy    . Coronary stent placement  2006    Right Coronary Artery Cypher stents placed 2006  . Cataract extraction    . Colonoscopy    . Pacemaker insertion  07/25/2009    St.Jude Accent  . Cardiac catheterization  02/12/2009    mild ostial left main disease,patent LAD & RCA stents   . US echocardiography  07/12/2009    borderline LA enlargement,mild mitral annular ca+, AOV mildly sclerotic,trace AI.  Marland Kitchen Nm myocar perf wall motion  08/14/2008    no significant ischemia EF 64%  . Left heart catheterization with coronary angiogram N/A 07/01/2013    Procedure: LEFT HEART CATHETERIZATION WITH CORONARY ANGIOGRAM;  Surgeon: Sanda Klein, MD;  Location: [place] CATH LAB;  Service: Cardiovascular;  Laterality: N/A;  . Cardioversion N/A 11/06/2014    Procedure: CARDIOVERSION;  Surgeon: [name],  MD;  Location: [facility] ENDOSCOPY;  Service: Cardiovascular;  Laterality: N/A;     Allergies  Allergies  Allergen Reactions  . Amiodarone Nausea And Vomiting  . Simvastatin Other (See Comments)    Unknown reaction  . Penicillins Hives, Swelling and Rash    HPI  Steve Norris is a 82 y.o. male, followed by Dr. Sallyanne Kuster, with a history of PAF on Coumadin, bradycardia with St. Jude pacemaker, CAD with LAD and RCA stents, GERD and COPD. In July 2016, he was admitted for symptomatic atrial fibrillation w/ RVR. He was started on oral amiodarone and required DCCV on 11/06/14, which was successful. He was last seen in clinic 12/06/14 and was in [place].  Of note, his amiodarone was eventually stopped due to intolerance with nausea. He has since been on rate control only strategy with diltiazem and Bystolic. His INRs are followed in our Coumadin Clinic.   He recently underwent a spinal epidural 07/31/15. Prior to that, he was granted clearance by Dr. Sallyanne Kuster to hold Coumadin 5 days prior to procedure. Following the procedure yesterday he developed a rapid HR. Also with exertional dyspnea. He was advised by Dr. Sallyanne Kuster, via phone, to increase his diltiazem and present to the office for an EKG with a RN. This was preformed today and confirmed rapid afib with a rate of 146 bpm. Direct hospital admission was recommended with plans  for TEE cardioversion. TEE would be required given his INR is sub therapeutic after holding Coumadin for procedure.   Also of importance, I have spoken with Dr. Sallyanne Kuster regarding patient. 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 The thought process is since he has failed amiodarone in the past and has required prior cardioversion, he will likely need another antiarrythmic to maintain SR. Per Dr. Sallyanne Kuster, the only options for this patient would be Tikosyn or sotalol, both of which would require at  least 72 hr hospital monitoring, which would be difficult for the patient if he is unable to find someone to help care for his wife. If he is unable to find care for her and must go home after his cardioversion, he will need to go home with Lovenox for bridging until INR is therapeutic.   Home Medications  Prior to Admission medications   Medication Sig Start Date End Date Taking? Authorizing Provider  acetaminophen (TYLENOL) 500 MG tablet Take 500 mg by mouth daily.     Historical Provider, MD  albuterol (PROVENTIL HFA;VENTOLIN HFA) 108 (90 BASE) MCG/ACT inhaler Inhale 2 puffs into the lungs every 4 (four) hours as needed for wheezing or shortness of breath. Reported on 08/01/2015    Historical Provider, MD         Ascorbic Acid (VITAMIN C) 1000 MG tablet Take 1,000 mg by mouth daily.    Historical Provider, MD  aspirin EC 81 MG tablet Take 81 mg by mouth daily.    Historical Provider, MD  b complex vitamins capsule Take 1 capsule by mouth daily.    Historical Provider, MD  budesonide-formoterol (SYMBICORT) 160-4.5 MCG/ACT inhaler Inhale 1-2 puffs into the lungs 2 (two) times daily. Inhale 2 puffs every morning and 1 puff at bedtime    Historical Provider, MD  Cholecalciferol (VITAMIN D) 2000 UNITS tablet Take 2,000 Units by mouth daily.    Historical Provider, MD  diltiazem (CARTIA XT) 120 MG 24 hr capsule Take 1 capsule (120 mg total) by mouth at bedtime. 03/19/15   Mihai Croitoru, MD  docusate sodium (COLACE) 100 MG capsule Take 100 mg by mouth at bedtime as needed (constipation).    Historical Provider, MD  Esomeprazole Magnesium (NEXIUM PO) Take 44.6 mg by mouth daily.    Historical Provider, MD  finasteride (PROSCAR) 5 MG tablet Take 5 mg by mouth at bedtime.  02/17/12   Historical Provider, MD  furosemide (LASIX) 20 MG tablet Take 20 mg by mouth daily as needed for fluid or edema.     Historical Provider, MD  Multiple Vitamins-Minerals (HAIR/SKIN/NAILS) TABS Take 2 tablets by mouth daily.  Reported on 08/01/2015    Historical Provider, MD  nebivolol (BYSTOLIC) 5 MG tablet Take 1 tablet (5 mg total) by mouth daily. 05/09/15   Mihai Croitoru, MD  neomycin-bacitracin-polymyxin (NEOSPORIN) OINT Apply 1 application topically daily as needed for wound care.    Historical Provider, MD  nitroGLYCERIN (NITROSTAT) 0.4 MG SL tablet Place 0.4 mg under the tongue every 5 (five) minutes as needed for chest pain.     Historical Provider, MD  Omega-3 Fatty Acids (FISH OIL) 1200 MG CAPS Take 2,400 mg by mouth daily.    Historical Provider, MD  rosuvastatin (CRESTOR) 5 MG tablet Take 5 mg by mouth 3 (three) times a week. On Monday, Wednesday, and Friday nights    Historical Provider, MD  senna-docusate (SENNA-S) 8.6-50 MG per tablet  Take 1 tablet by mouth at bedtime.    Historical Provider, MD  temazepam (RESTORIL) 30 MG capsule Take 30 mg by mouth at bedtime. 05/24/13   Historical Provider, MD  vitamin E 400 UNIT capsule Take 400 Units by mouth daily.     Historical Provider, MD  warfarin (COUMADIN) 5 MG tablet take 1 tablet by mouth once daily OR AS DIRECTED BY COUMADIN CLINIC 05/09/15   Sanda Klein, MD    Family History  Family History  Problem Relation Age of Onset  . Colon cancer Neg Hx   . Heart disease Mother   . Heart disease Father     Social History  Social History   Social History  . Marital Status: Married    Spouse Name: N/A  . Number of Children: N/A  . Years of Education: N/A   Occupational History  . Retired     Social History Main Topics  . Smoking status: Former Research scientist (life sciences)  . Smokeless tobacco: Never Used     Comment: Quit smoking 1996  . Alcohol Use: No  . Drug Use: No  . Sexual Activity: Not on file   Other Topics Concern  . Not on file   Social History Narrative   Daily caffeine      Review of Systems General:  No chills, fever, night sweats or weight changes.  Cardiovascular:  No chest pain, dyspnea on exertion, edema, orthopnea, palpitations,  paroxysmal nocturnal dyspnea. Dermatological: No rash, lesions/masses Respiratory: No cough, dyspnea Urologic: No hematuria, dysuria Abdominal:   No nausea, vomiting, diarrhea, bright red blood per rectum, melena, or hematemesis Neurologic:  No visual changes, wkns, changes in mental status. All other systems reviewed and are otherwise negative except as noted above.  Physical Exam  There were no vitals taken for this visit.  General: Pleasant, NAD Psych: Normal affect. Neuro: Alert and oriented X 3. Moves all extremities spontaneously. HEENT: Normal  Neck: Supple without bruits or JVD. Lungs:  Resp regular and unlabored, CTA. Heart: irregularly irregular no s3, s4, or murmurs. Abdomen: Soft, non-tender, non-distended, BS + x 4.  Extremities: No clubbing or cyanosis. 1+ bilateral LEE. DP/PT/Radials 2+ and equal bilaterally.  Labs  Troponin (Point of Care Test) No results for input(s): TROPIPOC in the last 72 hours. No results for input(s): CKTOTAL, CKMB, TROPONINI in the last 72 hours. Lab Results  Component Value Date   WBC 8.7 11/05/2014   HGB 11.8* 11/05/2014   HCT 35.8* 11/05/2014   MCV 91.6 11/05/2014   PLT 370 11/05/2014   No results for input(s): NA, K, CL, CO2, BUN, CREATININE, CALCIUM, PROT, BILITOT, ALKPHOS, ALT, AST, GLUCOSE in the last 168 hours.  Invalid input(s): LABALBU No results found for: CHOL, HDL, LDLCALC, TRIG No results found for: DDIMER   Radiology/Studies  Ct Lumbar Spine Wo Contrast  07/09/2015  CLINICAL DATA:  Lumbar radiculopathy. Bilateral leg pain and numbness, worse on the left. Symptoms for 3 weeks. EXAM: CT LUMBAR SPINE WITHOUT CONTRAST TECHNIQUE: Multidetector CT imaging of the lumbar spine was performed without intravenous contrast administration. Multiplanar CT image reconstructions were also generated. COMPARISON:  None. FINDINGS: Lumbar spine is imaged from T11-12 through S2-3. Leftward curvature lumbar spine is centered at L3-4. There is  asymmetric right-sided endplate sclerotic change and a vacuum disc. Slight leftward listhesis is present at L4-5. AP alignment is anatomic. Atherosclerotic calcifications are present in the aorta without aneurysm. No other focal lesions are present. There is no significant adenopathy. L1-2: Mild facet hypertrophy is  present bilaterally without significant stenosis. L2-3: A far left lateral disc protrusion is present. This contributes to mild left foraminal narrowing. Facet spurring is more prominent on the right. L3-4: A broad-based disc protrusion is present. Mild facet hypertrophy is worse on the right. Moderate right and mild left foraminal stenosis is present. Mild subarticular narrowing is worse on the right. L4-5: A broad-based disc protrusion is present. Moderate facet hypertrophy is noted bilaterally. The combination results in moderate subarticular and foraminal stenosis bilaterally, worse on the right. L5-S1: A leftward disc protrusion is present. Moderate left foraminal stenosis is evident. The central canal and right foramen are patent. Facet spurring is worse on the left. IMPRESSION: 1. The most significant left-sided disease is L5-S1 with moderate left foraminal stenosis secondary to a leftward disc protrusion and asymmetric facet hypertrophy. 2. Mild left foraminal narrowing at L2-3 due to a far left lateral disc protrusion. 3. Moderate right and mild left foraminal stenosis at L3-4. 4. Moderate subarticular and foraminal stenosis bilaterally at L4-5 is worse on the right. Electronically Signed   By: San Morelle M.D.   On: 07/09/2015 16:13    ECG  Atrial fibrillation w/ RVR 146 bpm     ASSESSMENT AND PLAN  Principal Problem:   Atrial fibrillation with rapid ventricular response (HCC) Active Problems:   COPD (chronic obstructive pulmonary disease) (HCC)   CAD (coronary artery disease)   S/P placement of cardiac pacemaker   Long term current use of anticoagulant therapy   1.  Atrial Fibrillation w/ RVR: IV Cardizem for rate control. Continue home Bystolic. Continue Coumadin per pharmacy. No heparin given recent spinal injection. He is scheduled for TEE DCCV tomorrow with Dr. Radford Pax. TEE is required as his INR is sub therapeutic after being held for recent procedure. Also of importance, I have spoken with Dr. Sallyanne Kuster regarding patient. 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 The thought process is since he has failed amiodarone in the past and has required prior cardioversion, he will likely need another antiarrythmic to maintain SR. Per Dr. Sallyanne Kuster, the only options for this patient would be Tikosyn or sotalol, both of which would require at least 72 hr hospital monitoring, which would be difficult for the patient if he is unable to find someone to help care for his wife. If he is unable to find care for her and must go home after his cardioversion, he will need to go home with Lovenox for briding until INR is therapeutic.  Will make NPO at midnight. Pre procedural labs/ orders placed.    Janee Morn, PA-C 08/01/2015, 7:34 PM

## 2015-08-01 NOTE — Progress Notes (Signed)
ANTICOAGULATION CONSULT NOTE - Initial Consult  Pharmacy Consult for warfarin Indication: atrial fibrillation  Allergies  Allergen Reactions  . Amiodarone Nausea And Vomiting  . Simvastatin Other (See Comments)    Unknown reaction  . Penicillins Hives, Swelling and Rash    Patient Measurements: Height: 5\' 6"  (167.6 cm) Weight: 193 lb 8 oz (87.771 kg) IBW/kg (Calculated) : 63.8  Vital Signs: Temp: 97.6 F (36.4 C) (04/05 2012) Temp Source: Oral (04/05 2012) BP: 118/84 mmHg (04/05 2012) Pulse Rate: 143 (04/05 2012)  Labs:  Recent Labs  07/30/15 1357  INR 1.3    CrCl cannot be calculated (Patient has no serum creatinine result on file.).   Medical History: Past Medical History  Diagnosis Date  . Hiatal hernia 03-19-2009    EGD  . Erosive esophagitis 03-19-2009    EGD  . Barrett's esophagus 03-19-2009    EGD  . CAD (coronary artery disease)   . Paroxysmal atrial fibrillation   . Internal hemorrhoids 03-19-2009    Colonoscopy  . Diverticulosis 03-19-2009    Colonoscopy   . Arthritis   . Asthma   . Cataract     REMOVED  . COPD (chronic obstructive pulmonary disease)   . GERD (gastroesophageal reflux disease)   . Hyperlipidemia   . Hypertension     Assessment: 52 yoM with hx of PAF on warfarin PTA; however warfarin held from 3/30 to 4/3 due to planned spinal epidural procedure. Per med rec warfarin resumed on 4/4 pm. Most recent INR 1.3 on 4/3. He is being admitted for further mngt of his afib and potential TEE with cardioversion on 4/6. Pharmacy to resume warfarin this evening no heparin planned due to recent spinal injection.   PTA dose 2.5 mg on MWF and 5 mg AOD's.   Goal of Therapy:  INR 2-3 Monitor platelets by anticoagulation protocol: Yes   Plan:  1. Warfarin 5 mg x 1 this evening 2. Daily INR and CBC 3. Holding heparin due to recent spinal injection   Vincenza Hews, PharmD, BCPS 08/01/2015, 9:17 PM Pager: [phone]

## 2015-08-01 NOTE — Telephone Encounter (Signed)
Pt called. He had an epidural injection procedure yesterday and noted at that office visit his HR was fast. Notes hx of A Fib. He got reading of 160s to 180s on pulse oximetry at home. BP 0000000 systolic. Notes no fatigue, SOB, dizziness, or chest discomfort. He was not able to tell he was having fast HR.  Pt wanted to know whether to go to ER or not. Notes that today his HR was running between 140-150 when checked - lowest 127 bpm.  Spoke w Dr/ Croitoru - recommended pt double diltiazem today, come in for EKG, will need to set up for TEE cardioversion rather than standard DCCV (subtherapeutic INR due to coumadin hold for epidural). Discussed w patient - he will take extra diltiazem now and come in for EKG check this afternoon. Further instructions at Dr. Victorino December review of findings.

## 2015-08-01 NOTE — Telephone Encounter (Signed)
Pt in afib for 24 hrs maybe 48 hrs--pls advise -not sure if should go to ER, doesn't feel emergency to call 911 though--pls advise [phone] -has pacemaker

## 2015-08-01 NOTE — Telephone Encounter (Signed)
Pt to be admitted to hospital. See other encounter notes for EKG & nurse visit today.

## 2015-08-02 ENCOUNTER — Inpatient Hospital Stay (HOSPITAL_COMMUNITY): Payer: PPO | Admitting: Certified Registered Nurse Anesthetist

## 2015-08-02 ENCOUNTER — Encounter (HOSPITAL_COMMUNITY)
Admission: AD | Disposition: A | Discharge status: Home / Self Care | Payer: Self-pay | Source: Ambulatory Visit | Attending: Cardiovascular Disease

## 2015-08-02 ENCOUNTER — Encounter (HOSPITAL_COMMUNITY): Payer: Self-pay | Admitting: *Deleted

## 2015-08-02 ENCOUNTER — Inpatient Hospital Stay (HOSPITAL_COMMUNITY): Payer: PPO

## 2015-08-02 DIAGNOSIS — I517 Cardiomegaly: Secondary | ICD-10-CM | POA: Diagnosis not present

## 2015-08-02 DIAGNOSIS — J189 Pneumonia, unspecified organism: Secondary | ICD-10-CM | POA: Diagnosis not present

## 2015-08-02 DIAGNOSIS — I2510 Atherosclerotic heart disease of native coronary artery without angina pectoris: Secondary | ICD-10-CM | POA: Diagnosis not present

## 2015-08-02 DIAGNOSIS — I361 Nonrheumatic tricuspid (valve) insufficiency: Secondary | ICD-10-CM | POA: Diagnosis not present

## 2015-08-02 DIAGNOSIS — I480 Paroxysmal atrial fibrillation: Secondary | ICD-10-CM | POA: Diagnosis not present

## 2015-08-02 DIAGNOSIS — I340 Nonrheumatic mitral (valve) insufficiency: Secondary | ICD-10-CM

## 2015-08-02 DIAGNOSIS — Z7901 Long term (current) use of anticoagulants: Secondary | ICD-10-CM | POA: Diagnosis not present

## 2015-08-02 DIAGNOSIS — I4891 Unspecified atrial fibrillation: Secondary | ICD-10-CM | POA: Diagnosis not present

## 2015-08-02 DIAGNOSIS — R609 Edema, unspecified: Secondary | ICD-10-CM

## 2015-08-02 DIAGNOSIS — I110 Hypertensive heart disease with heart failure: Secondary | ICD-10-CM | POA: Diagnosis not present

## 2015-08-02 DIAGNOSIS — I5033 Acute on chronic diastolic (congestive) heart failure: Secondary | ICD-10-CM | POA: Diagnosis not present

## 2015-08-02 HISTORY — PX: TEE WITHOUT CARDIOVERSION: SHX5443

## 2015-08-02 HISTORY — PX: CARDIOVERSION: SHX1299

## 2015-08-02 LAB — PROTIME-INR
INR: 1.66 — ABNORMAL HIGH (ref 0.00–1.49)
Prothrombin Time: 19.7 seconds — ABNORMAL HIGH (ref 11.6–15.2)

## 2015-08-02 LAB — MAGNESIUM: Magnesium: 1.8 mg/dL (ref 1.7–2.4)

## 2015-08-02 LAB — CBC
HEMATOCRIT: 33.7 % — AB (ref 39.0–52.0)
HEMOGLOBIN: 10.9 g/dL — AB (ref 13.0–17.0)
MCH: 28.3 pg (ref 26.0–34.0)
MCHC: 32.3 g/dL (ref 30.0–36.0)
MCV: 87.5 fL (ref 78.0–100.0)
Platelets: 401 10*3/uL — ABNORMAL HIGH (ref 150–400)
RBC: 3.85 MIL/uL — AB (ref 4.22–5.81)
RDW: 16.8 % — ABNORMAL HIGH (ref 11.5–15.5)
WBC: 13.5 10*3/uL — AB (ref 4.0–10.5)

## 2015-08-02 LAB — BASIC METABOLIC PANEL
ANION GAP: 11 (ref 5–15)
BUN: 18 mg/dL (ref 6–20)
CALCIUM: 9.4 mg/dL (ref 8.9–10.3)
CO2: 25 mmol/L (ref 22–32)
Chloride: 100 mmol/L — ABNORMAL LOW (ref 101–111)
Creatinine, Ser: 0.76 mg/dL (ref 0.61–1.24)
GFR calc Af Amer: >60 mL/min (ref >60)
GFR calc non Af Amer: >60 mL/min (ref >60)
GLUCOSE: 130 mg/dL — AB (ref 65–99)
Potassium: 4.4 mmol/L (ref 3.5–5.1)
Sodium: 136 mmol/L (ref 135–145)

## 2015-08-02 LAB — HEPARIN LEVEL (UNFRACTIONATED)
HEPARIN UNFRACTIONATED: 0.16 [IU]/mL — AB (ref 0.30–0.70)
Heparin Unfractionated: 0.2 IU/mL — ABNORMAL LOW (ref 0.30–0.70)

## 2015-08-02 SURGERY — ECHOCARDIOGRAM, TRANSESOPHAGEAL
Anesthesia: Monitor Anesthesia Care

## 2015-08-02 MED ORDER — LACTATED RINGERS IV SOLN
INTRAVENOUS | Status: DC | PRN
Start: 2015-08-02 — End: 2015-08-02
  Administered 2015-08-02: 13:00:00 via INTRAVENOUS

## 2015-08-02 MED ORDER — WARFARIN SODIUM 5 MG PO TABS
5.0000 mg | ORAL_TABLET | Freq: Once | ORAL | Status: AC
  Administered 2015-08-02: 5 mg via ORAL
  Filled 2015-08-02: qty 1

## 2015-08-02 MED ORDER — BUTAMBEN-TETRACAINE-BENZOCAINE 2-2-14 % EX AERO
INHALATION_SPRAY | CUTANEOUS | Status: DC | PRN
Start: 2015-08-02 — End: 2015-08-02
  Administered 2015-08-02: 2 via TOPICAL

## 2015-08-02 MED ORDER — MAGNESIUM SULFATE 2 GM/50ML IV SOLN
2.0000 g | Freq: Once | INTRAVENOUS | Status: AC
  Administered 2015-08-02: 2 g via INTRAVENOUS
  Filled 2015-08-02: qty 50

## 2015-08-02 MED ORDER — SOTALOL HCL 80 MG PO TABS
80.0000 mg | ORAL_TABLET | Freq: Two times a day (BID) | ORAL | Status: DC
  Administered 2015-08-02 – 2015-08-05 (×6): 80 mg via ORAL
  Filled 2015-08-02 (×6): qty 1

## 2015-08-02 MED ORDER — PROPOFOL 10 MG/ML IV BOLUS
INTRAVENOUS | Status: DC | PRN
  Administered 2015-08-02: 10 mg via INTRAVENOUS
  Administered 2015-08-02: 30 mg via INTRAVENOUS
  Administered 2015-08-02 (×7): 10 mg via INTRAVENOUS

## 2015-08-02 MED ORDER — LIDOCAINE HCL (CARDIAC) 20 MG/ML IV SOLN
INTRAVENOUS | Status: DC | PRN
  Administered 2015-08-02: 20 mg via INTRAVENOUS
  Administered 2015-08-02: 60 mg via INTRAVENOUS

## 2015-08-02 NOTE — Progress Notes (Signed)
Per [employer] check for benefit coverage Tikosyn 250 mcg BID  vs Sotalol 80 mg BID Tikosyn- not covered  Sotalol is covered- copay $4- 30 day supply  (if any further benefit coverage needed- # to call [phone] opt O- will need MD name and NPI #)

## 2015-08-02 NOTE — Interval H&P Note (Signed)
History and Physical Interval Note:  08/02/2015 12:27 PM  Steve Norris  has presented today for surgery, with the diagnosis of a fib  The various methods of treatment have been discussed with the patient and family. After consideration of risks, benefits and other options for treatment, the patient has consented to  Procedure(s): TRANSESOPHAGEAL ECHOCARDIOGRAM (TEE) (N/A) CARDIOVERSION (N/A) as a surgical intervention .  The patient's history has been reviewed, patient examined, no change in status, stable for surgery.  I have reviewed the patient's chart and labs.  Questions were answered to the patient's satisfaction.     [name] R

## 2015-08-02 NOTE — Anesthesia Preprocedure Evaluation (Signed)
Anesthesia Evaluation  Patient identified by MRN, date of birth, ID band Patient awake    Reviewed: Allergy & Precautions, NPO status , Patient's Chart, lab work & pertinent test results, reviewed documented beta blocker date and time   History of Anesthesia Complications Negative for: history of anesthetic complications  Airway Mallampati: I  TM Distance: >3 FB Neck ROM: Full    Dental no notable dental hx. (+) Missing, Partial Lower   Pulmonary shortness of breath, COPD,  COPD inhaler, former smoker,    breath sounds clear to auscultation       Cardiovascular hypertension, Pt. on medications and Pt. on home beta blockers (-) angina+ CAD and +CHF  + dysrhythmias Atrial Fibrillation + pacemaker  Rhythm:Irregular     Neuro/Psych neg Seizures  Neuromuscular disease    GI/Hepatic Neg liver ROS, hiatal hernia, PUD, GERD  Medicated and Controlled,  Endo/Other  negative endocrine ROS  Renal/GU negative Renal ROS     Musculoskeletal  (+) Arthritis ,   Abdominal   Peds  Hematology  (+) anemia ,   Anesthesia Other Findings   Reproductive/Obstetrics                             Anesthesia Physical Anesthesia Plan  ASA: III  Anesthesia Plan: MAC   Post-op Pain Management:    Induction: Intravenous  Airway Management Planned: Nasal Cannula, Natural Airway and Simple Face Mask  Additional Equipment: None  Intra-op Plan:   Post-operative Plan:   Informed Consent: I have reviewed the patients History and Physical, chart, labs and discussed the procedure including the risks, benefits and alternatives for the proposed anesthesia with the patient or authorized representative who has indicated his/her understanding and acceptance.   Dental advisory given  Plan Discussed with: CRNA and Surgeon  Anesthesia Plan Comments:         Anesthesia Quick Evaluation

## 2015-08-02 NOTE — Progress Notes (Signed)
[place] for heparin Indication: atrial fibrillation  Allergies  Allergen Reactions  . Amiodarone Nausea And Vomiting  . Simvastatin Other (See Comments)    Unknown reaction  . Penicillins Hives, Swelling and Rash    Patient Measurements: Height: 5\' 6"  (167.6 cm) Weight: 193 lb 8 oz (87.771 kg) IBW/kg (Calculated) : 63.8  Heparin dosing weight: 85 kg  Vital Signs: Temp: 98.5 F (36.9 C) (04/06 1900) Temp Source: Oral (04/06 1900) BP: 101/60 mmHg (04/06 1900) Pulse Rate: 61 (04/06 1900)  Labs:  Recent Labs  08/01/15 2139 08/02/15 0207 08/02/15 0800 08/02/15 2104  HGB 10.6* 10.9*  --   --   HCT 31.5* 33.7*  --   --   PLT 393 401*  --   --   LABPROT  --  19.7*  --   --   INR  --  1.66*  --   --   HEPARINUNFRC  --   --  0.16* 0.20*  CREATININE 0.75 0.76  --   --     Estimated Creatinine Clearance: 71.4 mL/min (by C-G formula based on Cr of 0.76).  Assessment: 48 yoM with hx of PAF on warfarin PTA; however warfarin held from 3/30 to 4/3 due to epidural nerve block on 4/4 and Coumadin was resumed post procedure.S/p TEE with cardioversion on 4/6.  Patient continues on warfarin and heparin. Heparin level this evening was drawn ~3h late. Result was low at 0.2 units/mL- no issues with infusion and not stopped per RN.  PTA dose 2.5 mg on MWF and 5 mg AOD's.  Goal of Therapy:  Heparin level = 0.3-0.7 INR 2-3 Monitor platelets by anticoagulation protocol: Yes   Plan:  -Increase heparin to 1650 units/hr (~2units/kg/hr increase) -Next HL with AM labs -Daily HL and CBC  Shamere Dilworth D. Hydie Langan, PharmD, BCPS Clinical Pharmacist Pager: [phone] 08/02/2015 10:12 PM

## 2015-08-02 NOTE — CV Procedure (Signed)
    PROCEDURE NOTE:  Procedure:  Transesophageal echocardiogram Operator:  Fransico Him, MD Indications:  Atrial fibrillation Complications: None  Results: Normal LV size and function Normal RV size and function with pacer wire noted in the RV cavity.  Severely dilated RA with spontaneous echo contrast noted but no thrombus.  There was a pacer wire in the RA. Severely dilated LA with spontaneous echo contrast noted but no thrombus noted.  The left atrial appendage is large with evidence of spontaneous echo contrast but no evidence of thrombus.  The emptying velocity is < 20.   Normal TV with moderate TR Normal PV with trivial PR Normal MV with mild MR Mildly thickened trileaflet AV with trivial AR Normal interatrial septum with no evidence of shunt by colorflow dopper  Mild atherosclerosis of the thoracic and ascending aorta.  The patient tolerated the procedure well and went on to DCCV.  Signed: Fransico Him, MD [facility]    Electrical Cardioversion Procedure Note NAHIEM MAHONEY [id number] 1930/04/26  Procedure: Electrical Cardioversion Indications:  Atrial Fibrillation  Time Out: Verified patient identification, verified procedure,medications/allergies/relevent history reviewed, required imaging and test results available.  Performed  Procedure Details  The patient was NPO after midnight. Anesthesia was administered at the beside  by Dr.Moser with 110mg  of propofol and Lidocaine 80mg  IV (total for TEE and DCCV).  Cardioversion was done with synchronized biphasic defibrillation with AP pads with 150watts.  The patient converted to normal sinus rhythm. The patient tolerated the procedure well   IMPRESSION:  Successful cardioversion of atrial fibrillation    Aja Whitehair R 08/02/2015, 1:14 PM

## 2015-08-02 NOTE — Progress Notes (Addendum)
S/p Successful TEE/DCCV. Dr. Irish Lack has discussed case with Dr. Renaee Munda. Plan to start Tikosyn however the patient is concern about cost. Other options would be Sotalol. Updated Case Manager to get price of both--> might be hard to get price today as its already 4:40pm.   Tikosyn is not covered. $4 copay for sotalol. K of 4.4 this mornng. Mg of 1.8 yesterday. Will give 2gm iv of Mg. Get Baseline EKG and start Sotalol 80mg  BID tonight.    Steve Norris, [place]

## 2015-08-02 NOTE — Care Management Note (Signed)
Case Management Note Marvetta Gibbons RN, BSN Unit 2W-Case Manager [phone]  Patient Details  Name: Steve Norris MRN: [id number] Date of Birth: [birth year]  Subjective/Objective:     Pt admitted with afib s/p cardioversion               Action/Plan: PTA pt lived at home- received call from Cardiology PA at 4:30- regarding benefit check for cost of Tikosyn 250 mcg vs Sotalol 80 mg BID- will attempt to reach insurance for coverage cost on both drugs may be difficult due to lateness of the day- will update oncall PA if information received.   Expected Discharge Date:                  Expected Discharge Plan:  Home/Self Care  In-House Referral:     Discharge planning Services  CM Consult, Medication Assistance  Post Acute Care Choice:    Choice offered to:     DME Arranged:    DME Agency:     HH Arranged:    HH Agency:     Status of Service:  In process, will continue to follow  Medicare Important Message Given:    Date Medicare IM Given:    Medicare IM give by:    Date Additional Medicare IM Given:    Additional Medicare Important Message give by:     If discussed at [place] of Stay Meetings, dates discussed:    Additional Comments:  Dawayne Patricia, RN 08/02/2015, 4:45 PM

## 2015-08-02 NOTE — Progress Notes (Signed)
Patient Name: Steve Norris Date of Encounter: 08/02/2015  Primary Cardiologist: Dr. Sallyanne Kuster  Pt. Profile:  82 y/o male with h/o a history of PAF on Coumadin, bradycardia with St. Jude pacemaker, CAD with LAD and RCA stents, GERD and COPD, admitted for recurrent atrial fibrillation w/ RVR. Patient is s/p recent epidural injection 07/31/15, thus his Coumadin was held for several days and INR is sub therapeutic.    SUBJECTIVE  Feeling well. No chest pain, sob or palpitations.   CURRENT MEDS . aspirin EC  81 mg Oral Daily  . finasteride  5 mg Oral QHS  . furosemide  40 mg Oral BID  . mometasone-formoterol  2 puff Inhalation BID  . nebivolol  5 mg Oral Daily  . pantoprazole  40 mg Oral Daily  . rosuvastatin  5 mg Oral Once per day on Mon Wed Fri  . sodium chloride flush  3 mL Intravenous Q12H  . temazepam  30 mg Oral QHS  . warfarin  5 mg Oral ONCE-1800  . Warfarin - Pharmacist Dosing Inpatient   Does not apply q1800    OBJECTIVE  Filed Vitals:   08/01/15 2012 08/02/15 0554 08/02/15 0724 08/02/15 1011  BP: 118/84 113/60  110/78  Pulse: 143 140  73  Temp: 97.6 F (36.4 C) 97.5 F (36.4 C)  97.8 F (36.6 C)  TempSrc: Oral Oral  Oral  Resp: 18 20  18   Height: 5\' 6"  (1.676 m)     Weight: 193 lb 8 oz (87.771 kg)     SpO2: 97% 98% 98% 100%    Intake/Output Summary (Last 24 hours) at 08/02/15 1021 Last data filed at 08/02/15 0800  Gross per 24 hour  Intake      0 ml  Output      0 ml  Net      0 ml   Filed Weights   08/01/15 2012  Weight: 193 lb 8 oz (87.771 kg)    PHYSICAL EXAM  General: Pleasant, NAD. Neuro: Alert and oriented X 3. Moves all extremities spontaneously. Psych: Normal affect. HEENT:  Normal  Neck: Supple without bruits or JVD. Lungs:  Resp regular and unlabored, CTA. Heart: Ir Ir with tachycardia no s3, s4, or murmurs. Abdomen: Soft, non-tender, non-distended, BS + x 4.  Extremities: No clubbing, cyanosis 2+ BL Le  edema. DP/PT/Radials 2+ and  equal bilaterally.  Accessory Clinical Findings  CBC  Recent Labs  08/01/15 2139 08/02/15 0207  WBC 13.3* 13.5*  NEUTROABS 9.5*  --   HGB 10.6* 10.9*  HCT 31.5* 33.7*  MCV 87.3 87.5  PLT 393 123XX123*   Basic Metabolic Panel  Recent Labs  08/01/15 2139 08/02/15 0207  NA 133* 136  K 4.3 4.4  CL 100* 100*  CO2 23 25  GLUCOSE 132* 130*  BUN 17 18  CREATININE 0.75 0.76  CALCIUM 9.1 9.4  MG 1.8  --   Thyroid Function Tests  Recent Labs  08/01/15 2139  TSH 0.609    TELE  afib at rate of 80-130s  Radiology/Studies  Ct Lumbar Spine Wo Contrast  07/09/2015  CLINICAL DATA:  Lumbar radiculopathy. Bilateral leg pain and numbness, worse on the left. Symptoms for 3 weeks. EXAM: CT LUMBAR SPINE WITHOUT CONTRAST TECHNIQUE: Multidetector CT imaging of the lumbar spine was performed without intravenous contrast administration. Multiplanar CT image reconstructions were also generated. COMPARISON:  None. FINDINGS: Lumbar spine is imaged from T11-12 through S2-3. Leftward curvature lumbar spine is centered at L3-4. There  is asymmetric right-sided endplate sclerotic change and a vacuum disc. Slight leftward listhesis is present at L4-5. AP alignment is anatomic. Atherosclerotic calcifications are present in the aorta without aneurysm. No other focal lesions are present. There is no significant adenopathy. L1-2: Mild facet hypertrophy is present bilaterally without significant stenosis. L2-3: A far left lateral disc protrusion is present. This contributes to mild left foraminal narrowing. Facet spurring is more prominent on the right. L3-4: A broad-based disc protrusion is present. Mild facet hypertrophy is worse on the right. Moderate right and mild left foraminal stenosis is present. Mild subarticular narrowing is worse on the right. L4-5: A broad-based disc protrusion is present. Moderate facet hypertrophy is noted bilaterally. The combination results in moderate subarticular and foraminal  stenosis bilaterally, worse on the right. L5-S1: A leftward disc protrusion is present. Moderate left foraminal stenosis is evident. The central canal and right foramen are patent. Facet spurring is worse on the left. IMPRESSION: 1. The most significant left-sided disease is L5-S1 with moderate left foraminal stenosis secondary to a leftward disc protrusion and asymmetric facet hypertrophy. 2. Mild left foraminal narrowing at L2-3 due to a far left lateral disc protrusion. 3. Moderate right and mild left foraminal stenosis at L3-4. 4. Moderate subarticular and foraminal stenosis bilaterally at L4-5 is worse on the right. Electronically Signed   By: San Morelle M.D.   On: 07/09/2015 16:13    ASSESSMENT AND PLAN  1. Atrial fibrillation with rapid ventricular response (HCC) - Rate is still elevated at time on IV cardizem. For TEE/DCCV later today.On coumadin and heparin for anticoagulation. Intolerant to amiodarone. CHADSVASc score of 3. - He has a difficult social situation in which he is the sole caregiver for his disabled wife, making extended hospitalizations difficult. Per Dr. Sallyanne Kuster, the only options for this patient would be Tikosyn or sotalol, both of which would require at least 72 hr hospital monitoring, which would be difficult for the patient if he is unable to find someone to help care for his wife. If he is unable to find care for her and must go home after his cardioversion, he will need to go home with Lovenox for briding until INR is therapeutic.  2. LE edema - No I&O recorded, will place. Continue IV lasix 40mg  BID. Lungs clear.   Signed, Bhagat,Bhavinkumar PA-C Pager [phone]  I have examined the patient and reviewed assessment and plan and discussed with patient.  Agree with above as stated.  HR not well controlled.  For TEE/CV later today.  All questions about the procedure answered.  He is urinating a lot.  He is concerned about the expense of Tikosyn.  IV heparin until  COumadin can be restarted.    VARANASI,JAYADEEP S.

## 2015-08-02 NOTE — Progress Notes (Signed)
Utilization review completed.  

## 2015-08-02 NOTE — Progress Notes (Signed)
  Echocardiogram Echocardiogram Transesophageal has been performed.  Steve Norris 08/02/2015, 1:19 PM

## 2015-08-02 NOTE — Plan of Care (Signed)
Problem: Cardiac: Goal: Ability to achieve and maintain adequate cardiopulmonary perfusion will improve Outcome: Progressing Pt started on cardizem drip.

## 2015-08-02 NOTE — Progress Notes (Signed)
[place] for warfarin / heparin Indication: atrial fibrillation  Allergies  Allergen Reactions  . Amiodarone Nausea And Vomiting  . Simvastatin Other (See Comments)    Unknown reaction  . Penicillins Hives, Swelling and Rash    Patient Measurements: Height: 5\' 6"  (167.6 cm) Weight: 193 lb 8 oz (87.771 kg) IBW/kg (Calculated) : 63.8  Heparin dosing weight: 85 kg  Vital Signs: Temp: 97.5 F (36.4 C) (04/06 0554) Temp Source: Oral (04/06 0554) BP: 113/60 mmHg (04/06 0554) Pulse Rate: 140 (04/06 0554)  Labs:  Recent Labs  07/30/15 1357 08/01/15 2139 08/02/15 0207 08/02/15 0800  HGB  --  10.6* 10.9*  --   HCT  --  31.5* 33.7*  --   PLT  --  393 401*  --   LABPROT  --   --  19.7*  --   INR 1.3  --  1.66*  --   HEPARINUNFRC  --   --   --  0.16*  CREATININE  --  0.75 0.76  --     Estimated Creatinine Clearance: 71.4 mL/min (by C-G formula based on Cr of 0.76).   Assessment: 55 yoM with hx of PAF on warfarin PTA; however warfarin held from 3/30 to 4/3 due to epidural nerve block on 4/4 and coumadin was resumed post procedure .Plans for TEE with cardioversion on 4/6. Pharmacy to resume warfarin and heparin. -Heparin level= 0.16 on 1250 units/hr  PTA dose 2.5 mg on MWF and 5 mg AOD's.  Goal of Therapy:  Heparin level = 0.3-0.7 INR 2-3 Monitor platelets by anticoagulation protocol: Yes   Plan:  -Increase heparin to 1500 units/hr -Heparin level in 6 hours and daily wth CBC daily -Give Warfarin 5 mg x 1 this evening -Daily PT/INR  Hildred Laser, Pharm D 08/02/2015 9:56 AM

## 2015-08-02 NOTE — Transfer of Care (Signed)
Immediate Anesthesia Transfer of Care Note  Patient: Steve Norris  Procedure(s) Performed: Procedure(s): TRANSESOPHAGEAL ECHOCARDIOGRAM (TEE) (N/A) CARDIOVERSION (N/A)  Patient Location: Endoscopy Unit  Anesthesia Type:MAC  Level of Consciousness: awake, alert  and oriented  Airway & Oxygen Therapy: Patient Spontanous Breathing  Post-op Assessment: Report given to RN and Post -op Vital signs reviewed and stable  Post vital signs: Reviewed and stable  Last Vitals:  Filed Vitals:   08/02/15 1011 08/02/15 1220  BP: 110/78 122/72  Pulse: 73 87  Temp: 36.6 C 36.6 C  Resp: 18 12    Complications: No apparent anesthesia complications

## 2015-08-03 ENCOUNTER — Inpatient Hospital Stay (HOSPITAL_COMMUNITY): Payer: PPO

## 2015-08-03 ENCOUNTER — Encounter: Payer: PPO | Admitting: Pharmacist Clinician (PhC)/ Clinical Pharmacy Specialist

## 2015-08-03 DIAGNOSIS — I480 Paroxysmal atrial fibrillation: Secondary | ICD-10-CM | POA: Diagnosis not present

## 2015-08-03 DIAGNOSIS — I5033 Acute on chronic diastolic (congestive) heart failure: Secondary | ICD-10-CM | POA: Diagnosis not present

## 2015-08-03 DIAGNOSIS — J449 Chronic obstructive pulmonary disease, unspecified: Secondary | ICD-10-CM

## 2015-08-03 DIAGNOSIS — I4891 Unspecified atrial fibrillation: Secondary | ICD-10-CM | POA: Diagnosis not present

## 2015-08-03 DIAGNOSIS — Z7901 Long term (current) use of anticoagulants: Secondary | ICD-10-CM | POA: Diagnosis not present

## 2015-08-03 DIAGNOSIS — I2510 Atherosclerotic heart disease of native coronary artery without angina pectoris: Secondary | ICD-10-CM | POA: Diagnosis not present

## 2015-08-03 DIAGNOSIS — R05 Cough: Secondary | ICD-10-CM | POA: Diagnosis not present

## 2015-08-03 DIAGNOSIS — J189 Pneumonia, unspecified organism: Secondary | ICD-10-CM | POA: Diagnosis not present

## 2015-08-03 DIAGNOSIS — I110 Hypertensive heart disease with heart failure: Secondary | ICD-10-CM | POA: Diagnosis not present

## 2015-08-03 DIAGNOSIS — R609 Edema, unspecified: Secondary | ICD-10-CM | POA: Diagnosis not present

## 2015-08-03 LAB — CBC
HCT: 31 % — ABNORMAL LOW (ref 39.0–52.0)
Hemoglobin: 10.1 g/dL — ABNORMAL LOW (ref 13.0–17.0)
MCH: 28.4 pg (ref 26.0–34.0)
MCHC: 32.6 g/dL (ref 30.0–36.0)
MCV: 87.1 fL (ref 78.0–100.0)
PLATELETS: 348 10*3/uL (ref 150–400)
RBC: 3.56 MIL/uL — AB (ref 4.22–5.81)
RDW: 16.7 % — AB (ref 11.5–15.5)
WBC: 12.3 10*3/uL — ABNORMAL HIGH (ref 4.0–10.5)

## 2015-08-03 LAB — PROTIME-INR
INR: 2.11 — ABNORMAL HIGH (ref 0.00–1.49)
PROTHROMBIN TIME: 23.5 s — AB (ref 11.6–15.2)

## 2015-08-03 LAB — BASIC METABOLIC PANEL
Anion gap: 9 (ref 5–15)
BUN: 19 mg/dL (ref 6–20)
CHLORIDE: 99 mmol/L — AB (ref 101–111)
CO2: 30 mmol/L (ref 22–32)
CREATININE: 0.86 mg/dL (ref 0.61–1.24)
Calcium: 8.5 mg/dL — ABNORMAL LOW (ref 8.9–10.3)
GFR calc non Af Amer: >60 mL/min (ref >60)
GLUCOSE: 99 mg/dL (ref 65–99)
Potassium: 3.3 mmol/L — ABNORMAL LOW (ref 3.5–5.1)
Sodium: 138 mmol/L (ref 135–145)

## 2015-08-03 LAB — STREP PNEUMONIAE URINARY ANTIGEN: STREP PNEUMO URINARY ANTIGEN: NEGATIVE

## 2015-08-03 LAB — MAGNESIUM: Magnesium: 1.9 mg/dL (ref 1.7–2.4)

## 2015-08-03 LAB — HEPARIN LEVEL (UNFRACTIONATED): Heparin Unfractionated: 0.15 IU/mL — ABNORMAL LOW (ref 0.30–0.70)

## 2015-08-03 MED ORDER — ALBUTEROL SULFATE (2.5 MG/3ML) 0.083% IN NEBU
2.5000 mg | INHALATION_SOLUTION | RESPIRATORY_TRACT | Status: DC | PRN
  Administered 2015-08-03: 2.5 mg via RESPIRATORY_TRACT
  Filled 2015-08-03: qty 3

## 2015-08-03 MED ORDER — FUROSEMIDE 10 MG/ML IJ SOLN
40.0000 mg | Freq: Two times a day (BID) | INTRAMUSCULAR | Status: DC
  Administered 2015-08-03: 40 mg via INTRAVENOUS
  Filled 2015-08-03: qty 4

## 2015-08-03 MED ORDER — WARFARIN SODIUM 2.5 MG PO TABS
2.5000 mg | ORAL_TABLET | Freq: Once | ORAL | Status: AC
  Administered 2015-08-03: 2.5 mg via ORAL
  Filled 2015-08-03: qty 1

## 2015-08-03 MED ORDER — MAGNESIUM SULFATE 2 GM/50ML IV SOLN
2.0000 g | Freq: Once | INTRAVENOUS | Status: AC
Start: 2015-08-03 — End: 2015-08-03
  Administered 2015-08-03: 2 g via INTRAVENOUS
  Filled 2015-08-03: qty 50

## 2015-08-03 MED ORDER — POTASSIUM CHLORIDE CRYS ER 20 MEQ PO TBCR
40.0000 meq | EXTENDED_RELEASE_TABLET | Freq: Three times a day (TID) | ORAL | Status: AC
  Administered 2015-08-03 (×3): 40 meq via ORAL
  Filled 2015-08-03 (×3): qty 2

## 2015-08-03 MED ORDER — LEVOFLOXACIN IN D5W 750 MG/150ML IV SOLN
750.0000 mg | INTRAVENOUS | Status: DC
  Administered 2015-08-03: 750 mg via INTRAVENOUS
  Filled 2015-08-03: qty 150

## 2015-08-03 MED ORDER — POTASSIUM CHLORIDE CRYS ER 20 MEQ PO TBCR
20.0000 meq | EXTENDED_RELEASE_TABLET | Freq: Two times a day (BID) | ORAL | Status: DC
  Administered 2015-08-04 – 2015-08-05 (×3): 20 meq via ORAL
  Filled 2015-08-03 (×3): qty 1

## 2015-08-03 NOTE — Care Management Important Message (Signed)
Important Message  Patient Details  Name: Steve Norris MRN: [id number] Date of Birth: 06-21-1929   Medicare Important Message Given:  Yes    Nathen May 08/03/2015, 10:57 AM

## 2015-08-03 NOTE — Progress Notes (Deleted)
.vbpr   Patient Name: Steve Norris Date of Encounter: 08/03/2015  Primary Cardiologist: Dr. Sallyanne Kuster  Pt. Profile:  82 y/o male with h/o a history of PAF on Coumadin, bradycardia with St. Jude pacemaker, CAD with LAD and RCA stents, GERD and COPD, admitted for recurrent atrial fibrillation w/ RVR. Patient is s/p recent epidural injection 07/31/15, thus his Coumadin was held for several days and INR is sub therapeutic.    SUBJECTIVE  Feeling weak and tired. Has mild cough. No SOB or chest pain.   CURRENT MEDS . aspirin EC  81 mg Oral Daily  . finasteride  5 mg Oral QHS  . furosemide  40 mg Oral BID  . mometasone-formoterol  2 puff Inhalation BID  . nebivolol  5 mg Oral Daily  . pantoprazole  40 mg Oral Daily  . rosuvastatin  5 mg Oral Once per day on Mon Wed Fri  . sodium chloride flush  3 mL Intravenous Q12H  . sotalol  80 mg Oral Q12H  . temazepam  30 mg Oral QHS  . Warfarin - Pharmacist Dosing Inpatient   Does not apply q1800    OBJECTIVE  Filed Vitals:   08/02/15 1900 08/02/15 2308 08/03/15 0430 08/03/15 0939  BP: 101/60  105/63   Pulse: 61  80   Temp: 98.5 F (36.9 C)  98.7 F (37.1 C)   TempSrc: Oral  Oral   Resp: 18  18   Height:      Weight:      SpO2: 93% 94% 92% 92%    Intake/Output Summary (Last 24 hours) at 08/03/15 1037 Last data filed at 08/03/15 1031  Gross per 24 hour  Intake   1424 ml  Output      0 ml  Net   1424 ml   Filed Weights   08/01/15 2012  Weight: 193 lb 8 oz (87.771 kg)    PHYSICAL EXAM  General: Pleasant, NAD. Neuro: Alert and oriented X 3. Moves all extremities spontaneously. Psych: Normal affect. HEENT:  Normal  Neck: Supple without bruits or JVD. Lungs:  Resp regular and unlabored. Bibasilar wheezing. No rales or rhonchi.  Heart: RRR with tachycardia no s3, s4, or murmurs. Abdomen: Soft, non-tender, non-distended, BS + x 4.  Extremities: No clubbing, cyanosis 1-2+ BL Le  edema. DP/PT/Radials 2+ and equal  bilaterally.  Accessory Clinical Findings  CBC  Recent Labs  08/01/15 2139 08/02/15 0207 08/03/15 0330  WBC 13.3* 13.5* 12.3*  NEUTROABS 9.5*  --   --   HGB 10.6* 10.9* 10.1*  HCT 31.5* 33.7* 31.0*  MCV 87.3 87.5 87.1  PLT 393 401* 0000000   Basic Metabolic Panel  Recent Labs  08/01/15 2139 08/02/15 0207 08/02/15 1815  NA 133* 136  --   K 4.3 4.4  --   CL 100* 100*  --   CO2 23 25  --   GLUCOSE 132* 130*  --   BUN 17 18  --   CREATININE 0.75 0.76  --   CALCIUM 9.1 9.4  --   MG 1.8  --  1.8  Thyroid Function Tests  Recent Labs  08/01/15 2139  TSH 0.609    TELE  A paced rhythm   Radiology/Studies  Ct Lumbar Spine Wo Contrast  07/09/2015  CLINICAL DATA:  Lumbar radiculopathy. Bilateral leg pain and numbness, worse on the left. Symptoms for 3 weeks. EXAM: CT LUMBAR SPINE WITHOUT CONTRAST TECHNIQUE: Multidetector CT imaging of the lumbar spine was performed without intravenous  contrast administration. Multiplanar CT image reconstructions were also generated. COMPARISON:  None. FINDINGS: Lumbar spine is imaged from T11-12 through S2-3. Leftward curvature lumbar spine is centered at L3-4. There is asymmetric right-sided endplate sclerotic change and a vacuum disc. Slight leftward listhesis is present at L4-5. AP alignment is anatomic. Atherosclerotic calcifications are present in the aorta without aneurysm. No other focal lesions are present. There is no significant adenopathy. L1-2: Mild facet hypertrophy is present bilaterally without significant stenosis. L2-3: A far left lateral disc protrusion is present. This contributes to mild left foraminal narrowing. Facet spurring is more prominent on the right. L3-4: A broad-based disc protrusion is present. Mild facet hypertrophy is worse on the right. Moderate right and mild left foraminal stenosis is present. Mild subarticular narrowing is worse on the right. L4-5: A broad-based disc protrusion is present. Moderate facet  hypertrophy is noted bilaterally. The combination results in moderate subarticular and foraminal stenosis bilaterally, worse on the right. L5-S1: A leftward disc protrusion is present. Moderate left foraminal stenosis is evident. The central canal and right foramen are patent. Facet spurring is worse on the left. IMPRESSION: 1. The most significant left-sided disease is L5-S1 with moderate left foraminal stenosis secondary to a leftward disc protrusion and asymmetric facet hypertrophy. 2. Mild left foraminal narrowing at L2-3 due to a far left lateral disc protrusion. 3. Moderate right and mild left foraminal stenosis at L3-4. 4. Moderate subarticular and foraminal stenosis bilaterally at L4-5 is worse on the right. Electronically Signed   By: San Morelle M.D.   On: 07/09/2015 16:13    ASSESSMENT AND PLAN  1. Atrial fibrillation with rapid ventricular response (HCC) - s/p Successful TEE/DCCV.  Intolerant to amiodarone. Started on Sotalol 80mg  BID. Tikosyn is not covered. QTC of 459ms. Pending BMET and Mg this morning. A paced rhythm on telemetry.  - CHADSVASc score of 3. Heparin to coumadin  per pharmacy for anticoagulation.  - The patient is on Bystolic, discontinue?Marland Kitchen Continue Crestor MWF, low dose due to myalgias.   2. LE edema - Still not recording output. Continue lasix po 40mg  BID. Edema improving, still have 1-2+ BL LE edema.   3. Bibasilar faint wheezing - No rales. Feeing weak and has cough. Hx of COPD. On Dulera. Will give nebulizer tx. If still wheezing in later in a day or fever or worsening of s/s will get CXR. Follow closely. Does not seems in heart failure.   4. Hx of symptomatic bradycardia s/p St. Jude pacemaker  5. CAD s/p LAD and RCA stent: stable, no anginal pain. Continue aspirin, statin and BB.   Jarrett Soho PA-C Pager [phone]

## 2015-08-03 NOTE — Progress Notes (Signed)
Spoke w/ Steve Norris. He is providing permission for [place] to speak with his son Kasan Lightle and provide him with any updates on his status. Son's phone # is [phone]. Steve Norris states his wife is an invalid at home being cared for by others but if she needed any updates that we could also share with her.

## 2015-08-03 NOTE — Consult Note (Signed)
[facility] Medical Consultation  Steve Norris [id number] DOB: Jan 15, 1930 DOA: 08/01/2015 PCP: Tommy Medal, MD   Requesting physician:  Ezequiel Kayser- Dr. Irish Lack Date of consultation: 08/03/15 Reason for consultation: Pneumonia  Impression/Recommendations  Community acquired PNA (CAP) left lower lobe. Some coughing. No respiratory distress.  -CAP order set used -IV Levaquin (PCN allergy so chose not to use Azithromycin).  -will need close monitoring of INR while on Levaquin  Atrial Fibrillation with RVR, s/p TEE / DCCV  Yesterday.. On chronic coumadin.     Chronic anticoagulation with warfarin.   CAD, s/p stenting 2010   Pacemaker - placed for symptomatic bradycardia.   Barrett's esophagus, on chronic PPI  COPD, not on home 02.  -continue Dulera inhalers.   We will followup again tomorrow. Please contact me if I can be of assistance in the meanwhile. Thank you for this consultation.  Chief Complaint:  pneumonia  HPI:  Patient has COPD, stopped smoking 20 years ago. No 02 requirements. Uses Symbicort everyday. Rarely requires Albuterol.  Cough at home productive of white sputum. Endorses chills and fatigue. He has chronic SOB, worse over last few days.   Recently developed acute low back pain, got an epidural injection 3 days ago. No other medical complaints   Review of Systems:  Constitutional: Denies fever,diaphoresis, appetite change and fatigue., + chills HEENT: Denies photophobia, eye pain, redness, hearing loss, ear pain, congestion, sore throat, rhinorrhea, sneezing, mouth sores, trouble swallowing, neck pain, neck stiffness and tinnitus.  Respiratory: ++ chronic SOB, cough Cardiovascular: Denies chest pain, palpitations and leg swelling.  Gastrointestinal: Denies nausea, vomiting, abdominal pain, diarrhea, constipation, blood in stool and abdominal distention.  Genitourinary: Denies dysuria, urgency, frequency, hematuria, flank pain and difficulty  urinating.  Musculoskeletal: Denies myalgias,  joint swelling, arthralgias and gait problem.  Skin: Denies pallor, rash and wound.  Neurological: Denies dizziness, seizures, syncope, weakness, light-headedness, numbness and headaches.  Hematological: Denies adenopathy. Easy bruising, personal or family bleeding history  Psychiatric/Behavioral: Denies suicidal ideation, mood changes, confusion, nervousness, sleep disturbance and agitation    Past Medical History  Diagnosis Date  . Hiatal hernia 03-19-2009    EGD  . Erosive esophagitis 03-19-2009    EGD  . Barrett's esophagus 03-19-2009    EGD  . CAD (coronary artery disease)   . Paroxysmal atrial fibrillation (HCC)   . Internal hemorrhoids 03-19-2009    Colonoscopy  . Diverticulosis 03-19-2009    Colonoscopy   . Arthritis   . Asthma   . Cataract     REMOVED  . COPD (chronic obstructive pulmonary disease) ([place])   . GERD (gastroesophageal reflux disease)   . Hyperlipidemia   . Hypertension    Past Surgical History  Procedure Laterality Date  . Vasectomy    . Hemorrhoid surgery    . Tonsillectomy    . Coronary stent placement  2006    Right Coronary Artery Cypher stents placed 2006  . Cataract extraction    . Colonoscopy    . Pacemaker insertion  07/25/2009    St.Jude Accent  . Cardiac catheterization  02/12/2009    mild ostial left main disease,patent LAD & RCA stents   . US echocardiography  07/12/2009    borderline LA enlargement,mild mitral annular ca+, AOV mildly sclerotic,trace AI.  Marland Kitchen Nm myocar perf wall motion  08/14/2008    no significant ischemia EF 64%  . Left heart catheterization with coronary angiogram N/A 07/01/2013    Procedure: LEFT HEART CATHETERIZATION WITH CORONARY ANGIOGRAM;  Surgeon:  Sanda Klein, MD;  Location: [facility] CATH LAB;  Service: Cardiovascular;  Laterality: N/A;  . Cardioversion N/A 11/06/2014    Procedure: CARDIOVERSION;  Surgeon: [name], MD;  Location: [facility] ENDOSCOPY;  Service:  Cardiovascular;  Laterality: N/A;   Social History:  reports that he has quit smoking. He has never used smokeless tobacco. He reports that he does not drink alcohol or use illicit drugs.  Allergies  Allergen Reactions  . Amiodarone Nausea And Vomiting  . Simvastatin Other (See Comments)    Unknown reaction  . Penicillins Hives, Swelling and Rash   Family History  Problem Relation Age of Onset  . Colon cancer Neg Hx   . Heart disease Mother   . Heart disease Father     Prior to Admission medications   Medication Sig Start Date End Date Taking? Authorizing Provider  acetaminophen (TYLENOL) 500 MG tablet Take 500 mg by mouth daily.    Yes Historical Provider, MD  albuterol (PROVENTIL HFA;VENTOLIN HFA) 108 (90 BASE) MCG/ACT inhaler Inhale 2 puffs into the lungs every 4 (four) hours as needed for wheezing or shortness of breath. Reported on 08/01/2015   Yes Historical Provider, MD  Ascorbic Acid (VITAMIN C) 1000 MG tablet Take 1,000 mg by mouth daily.   Yes Historical Provider, MD  aspirin EC 81 MG tablet Take 81 mg by mouth daily.   Yes Historical Provider, MD  b complex vitamins capsule Take 1 capsule by mouth daily.   Yes Historical Provider, MD  budesonide-formoterol (SYMBICORT) 160-4.5 MCG/ACT inhaler Inhale 1-2 puffs into the lungs 2 (two) times daily. Inhale 2 puffs every morning and 1 puff at bedtime   Yes Historical Provider, MD  Cholecalciferol (VITAMIN D) 2000 UNITS tablet Take 2,000 Units by mouth daily.   Yes Historical Provider, MD  diltiazem (CARTIA XT) 120 MG 24 hr capsule Take 1 capsule (120 mg total) by mouth at bedtime. 03/19/15  Yes Mihai Croitoru, MD  docusate sodium (COLACE) 100 MG capsule Take 100 mg by mouth at bedtime as needed (constipation).   Yes Historical Provider, MD  Esomeprazole Magnesium (NEXIUM PO) Take 44.6 mg by mouth daily.   Yes Historical Provider, MD  finasteride (PROSCAR) 5 MG tablet Take 5 mg by mouth at bedtime.  02/17/12  Yes Historical Provider,  MD  furosemide (LASIX) 20 MG tablet Take 20 mg by mouth daily as needed for fluid or edema.    Yes Historical Provider, MD  Multiple Vitamins-Minerals (HAIR/SKIN/NAILS) TABS Take 2 tablets by mouth daily. Reported on 08/01/2015   Yes Historical Provider, MD  nebivolol (BYSTOLIC) 5 MG tablet Take 1 tablet (5 mg total) by mouth daily. 05/09/15  Yes Mihai Croitoru, MD  neomycin-bacitracin-polymyxin (NEOSPORIN) OINT Apply 1 application topically daily as needed for wound care.   Yes Historical Provider, MD  nitroGLYCERIN (NITROSTAT) 0.4 MG SL tablet Place 0.4 mg under the tongue every 5 (five) minutes as needed for chest pain.    Yes Historical Provider, MD  Omega-3 Fatty Acids (FISH OIL) 1200 MG CAPS Take 2,400 mg by mouth daily.   Yes Historical Provider, MD  senna-docusate (SENNA-S) 8.6-50 MG per tablet Take 1 tablet by mouth at bedtime.   Yes Historical Provider, MD  temazepam (RESTORIL) 30 MG capsule Take 30 mg by mouth at bedtime. 05/24/13  Yes Historical Provider, MD  vitamin E 400 UNIT capsule Take 400 Units by mouth daily.    Yes Historical Provider, MD  warfarin (COUMADIN) 5 MG tablet take 1 tablet by mouth once  daily OR AS DIRECTED BY COUMADIN CLINIC 05/09/15  Yes Mihai Croitoru, MD  rosuvastatin (CRESTOR) 5 MG tablet Take 5 mg by mouth 3 (three) times a week. On Monday, Wednesday, and Friday nights    Historical Provider, MD   Physical Exam: Blood pressure 103/52, pulse 63, temperature 97.3 F (36.3 C), temperature source Oral, resp. rate 18, height 5\' 6"  (1.676 m), weight 87.771 kg (193 lb 8 oz), SpO2 98 %. Filed Vitals:   08/03/15 0430 08/03/15 1330  BP: 105/63 103/52  Pulse: 80 63  Temp: 98.7 F (37.1 C) 97.3 F (36.3 C)  Resp: 18 18    Constitutional: Vital signs reviewed. Patient is a well-developed and well-nourished in no acute distress and cooperative with exam. Alert and oriented x3.  Head: Normocephalic and atraumatic  Mouth: no erythema or exudates, MMM  Eyes: PER,  conjunctivae normal, No scleral icterus.  Neck: Supple, Trachea midline normal ROM, No JVD, mass, thyromegaly, or carotid bruit present.  Cardiovascular: regular rate, irregular rhythm  Pulmonary/Chest: Inspiratory crackles LLL, otherwise CTA Abdominal: Soft. Non-tender, non-distended, bowel sounds are normal, no masses, organomegaly, or guarding present.  GU: no CVA tenderness Musculoskeletal: No joint deformities, erythema, or stiffness, ROM full and no nontender Ext: no edema and no cyanosis, pulses palpable bilaterally (DP and PT)  Hematology: no cervical, inginal, or axillary adenopathy.  Neurological: A&O x3, Strength is normal and symmetric bilaterally, cranial nerve II-XII are grossly intact, no focal motor deficit, sensory intact to light touch bilaterally.  Skin: Warm, dry and intact. No rash, cyanosis, or clubbing.  Psychiatric: Normal mood and affect. speech and behavior is normal. Judgment and thought content normal. Cognition and memory are normal.   Labs on Admission:  Basic Metabolic Panel:  Recent Labs Lab 08/01/15 2139 08/02/15 0207 08/02/15 1815 08/03/15 1043  NA 133* 136  --  138  K 4.3 4.4  --  3.3*  CL 100* 100*  --  99*  CO2 23 25  --  30  GLUCOSE 132* 130*  --  99  BUN 17 18  --  19  CREATININE 0.75 0.76  --  0.86  CALCIUM 9.1 9.4  --  8.5*  MG 1.8  --  1.8 1.9   CBC:  Recent Labs Lab 08/01/15 2139 08/02/15 0207 08/03/15 0330  WBC 13.3* 13.5* 12.3*  NEUTROABS 9.5*  --   --   HGB 10.6* 10.9* 10.1*  HCT 31.5* 33.7* 31.0*  MCV 87.3 87.5 87.1  PLT 393 401* 348    Radiological Exams on Admission: Dg Chest 2 View  08/03/2015  CLINICAL DATA:  Wheezing with cough EXAM: CHEST  2 VIEW COMPARISON:  November 04, 2014 FINDINGS: There is interstitial and airspace consolidation throughout the superior, posterior, and lateral segments of the left lower lobe. There is underlying generalized interstitial prominence, likely representing a degree of fibrotic type  change. The heart is upper normal in size with pulmonary vascularity within normal limits. No adenopathy. Pacemaker leads are attached to the right atrium and right ventricle. IMPRESSION: Interstitial and airspace consolidation left lower lobe, consistent with pneumonia. Underlying interstitial prominence, likely chronic fibrotic type change. A mild degree of chronic congestive heart failure, however, cannot be excluded. Followup PA and lateral chest radiographs recommended in 3-4 weeks following trial of antibiotic therapy to ensure resolution and exclude underlying malignancy. Electronically Signed   By: Lowella Grip III M.D.   On: 08/03/2015 15:34    Time spent: 45 minutes  [facility] Pager  [phone]  If 7PM-7AM, please contact night-coverage [email or web address] Password [email or web address] 08/03/2015, 5:07 PM

## 2015-08-03 NOTE — Care Management Important Message (Signed)
Important Message  Patient Details  Name: Steve Norris MRN: [id number] Date of Birth: October 29, 1929   Medicare Important Message Given:  Yes    Akera Snowberger Abena 08/03/2015, 10:59 AM

## 2015-08-03 NOTE — Progress Notes (Signed)
ANTICOAGULATION CONSULT NOTE - Follow Up Consult  Pharmacy Consult for heparin Indication: atrial fibrillation   Labs:  Recent Labs  08/01/15 2139 08/02/15 0207 08/02/15 0800 08/02/15 2104 08/03/15 0330 08/03/15 0335  HGB 10.6* 10.9*  --   --  10.1*  --   HCT 31.5* 33.7*  --   --  31.0*  --   PLT 393 401*  --   --  348  --   LABPROT  --  19.7*  --   --  23.5*  --   INR  --  1.66*  --   --  2.11*  --   HEPARINUNFRC  --   --  0.16* 0.20*  --  0.15*  CREATININE 0.75 0.76  --   --   --   --      Assessment: 82yo male now w/ lower heparin level despite rate increase; INR now >2, spoke w/ cards fellow who would like to continue heparin until rounding team evaluates.  Goal of Therapy:  Heparin level 0.3-0.7 units/ml   Plan:  Will increase heparin gtt by 2-3 units/kg/hr to 1900 units/hr and check level in 8hr vs f/u w/ team to d/c heparin.  Wynona Neat, PharmD, BCPS  08/03/2015,4:23 AM

## 2015-08-03 NOTE — Progress Notes (Signed)
SUBJECTIVE:  Feels tired today.  OBJECTIVE:   Vitals:   Filed Vitals:   08/02/15 1900 08/02/15 2308 08/03/15 0430 08/03/15 0939  BP: 101/60  105/63   Pulse: 61  80   Temp: 98.5 F (36.9 C)  98.7 F (37.1 C)   TempSrc: Oral  Oral   Resp: 18  18   Height:      Weight:      SpO2: 93% 94% 92% 92%   I&O's:   Intake/Output Summary (Last 24 hours) at 08/03/15 1037 Last data filed at 08/03/15 1031  Gross per 24 hour  Intake   1424 ml  Output      0 ml  Net   1424 ml   TELEMETRY: Reviewed telemetry pt in atrial paced rhythm:     PHYSICAL EXAM General: Well developed, well nourished, in no acute distress Head:   Normal cephalic and atramatic  Lungs:   Clear bilaterally to auscultation. Heart:   HRRR S1 S2  No JVD.   Abdomen: abdomen soft and non-tender Msk:  Back normal,  Normal strength and tone for age. Extremities:   1+ ankle edema bilaterally.   Neuro: Alert and oriented. Psych:  Normal affect, responds appropriately Skin: No rash   LABS: Basic Metabolic Panel:  Recent Labs  08/01/15 2139 08/02/15 0207 08/02/15 1815  NA 133* 136  --   K 4.3 4.4  --   CL 100* 100*  --   CO2 23 25  --   GLUCOSE 132* 130*  --   BUN 17 18  --   CREATININE 0.75 0.76  --   CALCIUM 9.1 9.4  --   MG 1.8  --  1.8   Liver Function Tests: No results for input(s): AST, ALT, ALKPHOS, BILITOT, PROT, ALBUMIN in the last 72 hours. No results for input(s): LIPASE, AMYLASE in the last 72 hours. CBC:  Recent Labs  08/01/15 2139 08/02/15 0207 08/03/15 0330  WBC 13.3* 13.5* 12.3*  NEUTROABS 9.5*  --   --   HGB 10.6* 10.9* 10.1*  HCT 31.5* 33.7* 31.0*  MCV 87.3 87.5 87.1  PLT 393 401* 348   Cardiac Enzymes: No results for input(s): CKTOTAL, CKMB, CKMBINDEX, TROPONINI in the last 72 hours. BNP: Invalid input(s): POCBNP D-Dimer: No results for input(s): DDIMER in the last 72 hours. Hemoglobin A1C: No results for input(s): HGBA1C in the last 72 hours. Fasting Lipid  Panel: No results for input(s): CHOL, HDL, LDLCALC, TRIG, CHOLHDL, LDLDIRECT in the last 72 hours. Thyroid Function Tests:  Recent Labs  08/01/15 2139  TSH 0.609   Anemia Panel: No results for input(s): VITAMINB12, FOLATE, FERRITIN, TIBC, IRON, RETICCTPCT in the last 72 hours. Coag Panel:   Lab Results  Component Value Date   INR 2.11* 08/03/2015   INR 1.66* 08/02/2015   INR 1.3 07/30/2015    RADIOLOGY: Ct Lumbar Spine Wo Contrast  07/09/2015  CLINICAL DATA:  Lumbar radiculopathy. Bilateral leg pain and numbness, worse on the left. Symptoms for 3 weeks. EXAM: CT LUMBAR SPINE WITHOUT CONTRAST TECHNIQUE: Multidetector CT imaging of the lumbar spine was performed without intravenous contrast administration. Multiplanar CT image reconstructions were also generated. COMPARISON:  None. FINDINGS: Lumbar spine is imaged from T11-12 through S2-3. Leftward curvature lumbar spine is centered at L3-4. There is asymmetric right-sided endplate sclerotic change and a vacuum disc. Slight leftward listhesis is present at L4-5. AP alignment is anatomic. Atherosclerotic calcifications are present in the aorta without aneurysm. No other focal lesions are  present. There is no significant adenopathy. L1-2: Mild facet hypertrophy is present bilaterally without significant stenosis. L2-3: A far left lateral disc protrusion is present. This contributes to mild left foraminal narrowing. Facet spurring is more prominent on the right. L3-4: A broad-based disc protrusion is present. Mild facet hypertrophy is worse on the right. Moderate right and mild left foraminal stenosis is present. Mild subarticular narrowing is worse on the right. L4-5: A broad-based disc protrusion is present. Moderate facet hypertrophy is noted bilaterally. The combination results in moderate subarticular and foraminal stenosis bilaterally, worse on the right. L5-S1: A leftward disc protrusion is present. Moderate left foraminal stenosis is evident.  The central canal and right foramen are patent. Facet spurring is worse on the left. IMPRESSION: 1. The most significant left-sided disease is L5-S1 with moderate left foraminal stenosis secondary to a leftward disc protrusion and asymmetric facet hypertrophy. 2. Mild left foraminal narrowing at L2-3 due to a far left lateral disc protrusion. 3. Moderate right and mild left foraminal stenosis at L3-4. 4. Moderate subarticular and foraminal stenosis bilaterally at L4-5 is worse on the right. Electronically Signed   By: San Morelle M.D.   On: 07/09/2015 16:13      ASSESSMENT: Kathyrn Lass:    1) AFib: In NSR.  On Sotalol.  QTC ok.  COntinue sotalol at this time. Hopefully, he can be discharged on Sunday after receiving 6 doses of the Sotalol.  INR now therapeutic.  Stop heparin.  2) Acute on chronic diastolic heart failure: edema persists.  Will change to IV Lasix today and see if swelling and fluid overload improves.   Jettie Booze, MD  08/03/2015  10:37 AM

## 2015-08-03 NOTE — Progress Notes (Signed)
O.K., to give Betapace. , Per Dr Alejandro Mulling

## 2015-08-04 DIAGNOSIS — I480 Paroxysmal atrial fibrillation: Secondary | ICD-10-CM | POA: Diagnosis not present

## 2015-08-04 DIAGNOSIS — J189 Pneumonia, unspecified organism: Secondary | ICD-10-CM

## 2015-08-04 DIAGNOSIS — I5033 Acute on chronic diastolic (congestive) heart failure: Secondary | ICD-10-CM | POA: Diagnosis not present

## 2015-08-04 DIAGNOSIS — I110 Hypertensive heart disease with heart failure: Secondary | ICD-10-CM | POA: Diagnosis not present

## 2015-08-04 LAB — PROTIME-INR
INR: 2.25 — AB (ref 0.00–1.49)
Prothrombin Time: 24.6 seconds — ABNORMAL HIGH (ref 11.6–15.2)

## 2015-08-04 LAB — CBC
HCT: 31.7 % — ABNORMAL LOW (ref 39.0–52.0)
HEMOGLOBIN: 10.5 g/dL — AB (ref 13.0–17.0)
MCH: 28.8 pg (ref 26.0–34.0)
MCHC: 33.1 g/dL (ref 30.0–36.0)
MCV: 87.1 fL (ref 78.0–100.0)
Platelets: 330 10*3/uL (ref 150–400)
RBC: 3.64 MIL/uL — AB (ref 4.22–5.81)
RDW: 16.8 % — ABNORMAL HIGH (ref 11.5–15.5)
WBC: 10 10*3/uL (ref 4.0–10.5)

## 2015-08-04 LAB — HIV ANTIBODY (ROUTINE TESTING W REFLEX): HIV SCREEN 4TH GENERATION: NONREACTIVE

## 2015-08-04 LAB — BASIC METABOLIC PANEL
ANION GAP: 9 (ref 5–15)
BUN: 18 mg/dL (ref 6–20)
CO2: 28 mmol/L (ref 22–32)
Calcium: 8.5 mg/dL — ABNORMAL LOW (ref 8.9–10.3)
Chloride: 101 mmol/L (ref 101–111)
Creatinine, Ser: 0.77 mg/dL (ref 0.61–1.24)
GFR calc Af Amer: >60 mL/min (ref >60)
Glucose, Bld: 92 mg/dL (ref 65–99)
POTASSIUM: 4.2 mmol/L (ref 3.5–5.1)
SODIUM: 138 mmol/L (ref 135–145)

## 2015-08-04 LAB — MAGNESIUM: MAGNESIUM: 2.1 mg/dL (ref 1.7–2.4)

## 2015-08-04 MED ORDER — OXYCODONE HCL 5 MG PO TABS
5.0000 mg | ORAL_TABLET | Freq: Three times a day (TID) | ORAL | Status: DC | PRN
  Administered 2015-08-04 – 2015-08-05 (×2): 5 mg via ORAL
  Filled 2015-08-04 (×2): qty 1

## 2015-08-04 MED ORDER — DOXYCYCLINE HYCLATE 100 MG PO TABS
100.0000 mg | ORAL_TABLET | Freq: Two times a day (BID) | ORAL | Status: DC
Start: 2015-08-04 — End: 2015-08-05
  Administered 2015-08-04 – 2015-08-05 (×3): 100 mg via ORAL
  Filled 2015-08-04 (×3): qty 1

## 2015-08-04 MED ORDER — WARFARIN SODIUM 2.5 MG PO TABS
2.5000 mg | ORAL_TABLET | Freq: Once | ORAL | Status: AC
  Administered 2015-08-04: 2.5 mg via ORAL
  Filled 2015-08-04: qty 1

## 2015-08-04 MED ORDER — ACETAMINOPHEN 325 MG PO TABS
650.0000 mg | ORAL_TABLET | Freq: Four times a day (QID) | ORAL | Status: DC | PRN
  Administered 2015-08-04 (×2): 650 mg via ORAL
  Filled 2015-08-04 (×2): qty 2

## 2015-08-04 MED ORDER — FUROSEMIDE 10 MG/ML IJ SOLN
40.0000 mg | Freq: Two times a day (BID) | INTRAMUSCULAR | Status: DC
  Administered 2015-08-04 – 2015-08-05 (×3): 40 mg via INTRAVENOUS
  Filled 2015-08-04 (×3): qty 4

## 2015-08-04 NOTE — Progress Notes (Signed)
SUBJECTIVE:  No complaints   OBJECTIVE:   Vitals:   Filed Vitals:   08/03/15 1210 08/03/15 1330 08/03/15 2112 08/04/15 0511  BP:  103/52 101/58 105/55  Pulse:  63 79 62  Temp:  97.3 F (36.3 C) 98.4 F (36.9 C) 98.5 F (36.9 C)  TempSrc:  Oral Oral Oral  Resp:  18 18 20   Height:      Weight:    180 lb 11.2 oz (81.965 kg)  SpO2: 91% 98% 94% 94%   I&O's:   Intake/Output Summary (Last 24 hours) at 08/04/15 [id number] Last data filed at 08/04/15 0521  Gross per 24 hour  Intake    943 ml  Output      0 ml  Net    943 ml   TELEMETRY: Reviewed telemetry pt in NSR:     PHYSICAL EXAM General: Well developed, well nourished, in no acute distress Head: Eyes PERRLA, No xanthomas.   Normal cephalic and atramatic  Lungs:   Clear bilaterally to auscultation and percussion. Heart:   HRRR S1 S2 Pulses are 2+ & equal. Abdomen: Bowel sounds are positive, abdomen soft and non-tender without masses  Extremities:   2+ edema Neuro: Alert and oriented X 3. Psych:  Good affect, responds appropriately   LABS: Basic Metabolic Panel:  Recent Labs  08/03/15 1043 08/04/15 0422  NA 138 138  K 3.3* 4.2  CL 99* 101  CO2 30 28  GLUCOSE 99 92  BUN 19 18  CREATININE 0.86 0.77  CALCIUM 8.5* 8.5*  MG 1.9 2.1   Liver Function Tests: No results for input(s): AST, ALT, ALKPHOS, BILITOT, PROT, ALBUMIN in the last 72 hours. No results for input(s): LIPASE, AMYLASE in the last 72 hours. CBC:  Recent Labs  08/01/15 2139  08/03/15 0330 08/04/15 0422  WBC 13.3*  < > 12.3* 10.0  NEUTROABS 9.5*  --   --   --   HGB 10.6*  < > 10.1* 10.5*  HCT 31.5*  < > 31.0* 31.7*  MCV 87.3  < > 87.1 87.1  PLT 393  < > 348 330  < > = values in this interval not displayed. Cardiac Enzymes: No results for input(s): CKTOTAL, CKMB, CKMBINDEX, TROPONINI in the last 72 hours. BNP: Invalid input(s): POCBNP D-Dimer: No results for input(s): DDIMER in the last 72 hours. Hemoglobin A1C: No results for input(s):  HGBA1C in the last 72 hours. Fasting Lipid Panel: No results for input(s): CHOL, HDL, LDLCALC, TRIG, CHOLHDL, LDLDIRECT in the last 72 hours. Thyroid Function Tests:  Recent Labs  08/01/15 2139  TSH 0.609   Anemia Panel: No results for input(s): VITAMINB12, FOLATE, FERRITIN, TIBC, IRON, RETICCTPCT in the last 72 hours. Coag Panel:   Lab Results  Component Value Date   INR 2.25* 08/04/2015   INR 2.11* 08/03/2015   INR 1.66* 08/02/2015    RADIOLOGY: Dg Chest 2 View  08/03/2015  CLINICAL DATA:  Wheezing with cough EXAM: CHEST  2 VIEW COMPARISON:  November 04, 2014 FINDINGS: There is interstitial and airspace consolidation throughout the superior, posterior, and lateral segments of the left lower lobe. There is underlying generalized interstitial prominence, likely representing a degree of fibrotic type change. The heart is upper normal in size with pulmonary vascularity within normal limits. No adenopathy. Pacemaker leads are attached to the right atrium and right ventricle. IMPRESSION: Interstitial and airspace consolidation left lower lobe, consistent with pneumonia. Underlying interstitial prominence, likely chronic fibrotic type change. A mild degree of chronic  congestive heart failure, however, cannot be excluded. Followup PA and lateral chest radiographs recommended in 3-4 weeks following trial of antibiotic therapy to ensure resolution and exclude underlying malignancy. Electronically Signed   By: Lowella Grip III M.D.   On: 08/03/2015 15:34   Ct Lumbar Spine Wo Contrast  07/09/2015  CLINICAL DATA:  Lumbar radiculopathy. Bilateral leg pain and numbness, worse on the left. Symptoms for 3 weeks. EXAM: CT LUMBAR SPINE WITHOUT CONTRAST TECHNIQUE: Multidetector CT imaging of the lumbar spine was performed without intravenous contrast administration. Multiplanar CT image reconstructions were also generated. COMPARISON:  None. FINDINGS: Lumbar spine is imaged from T11-12 through S2-3. Leftward  curvature lumbar spine is centered at L3-4. There is asymmetric right-sided endplate sclerotic change and a vacuum disc. Slight leftward listhesis is present at L4-5. AP alignment is anatomic. Atherosclerotic calcifications are present in the aorta without aneurysm. No other focal lesions are present. There is no significant adenopathy. L1-2: Mild facet hypertrophy is present bilaterally without significant stenosis. L2-3: A far left lateral disc protrusion is present. This contributes to mild left foraminal narrowing. Facet spurring is more prominent on the right. L3-4: A broad-based disc protrusion is present. Mild facet hypertrophy is worse on the right. Moderate right and mild left foraminal stenosis is present. Mild subarticular narrowing is worse on the right. L4-5: A broad-based disc protrusion is present. Moderate facet hypertrophy is noted bilaterally. The combination results in moderate subarticular and foraminal stenosis bilaterally, worse on the right. L5-S1: A leftward disc protrusion is present. Moderate left foraminal stenosis is evident. The central canal and right foramen are patent. Facet spurring is worse on the left. IMPRESSION: 1. The most significant left-sided disease is L5-S1 with moderate left foraminal stenosis secondary to a leftward disc protrusion and asymmetric facet hypertrophy. 2. Mild left foraminal narrowing at L2-3 due to a far left lateral disc protrusion. 3. Moderate right and mild left foraminal stenosis at L3-4. 4. Moderate subarticular and foraminal stenosis bilaterally at L4-5 is worse on the right. Electronically Signed   By: San Morelle M.D.   On: 07/09/2015 16:13    ASSESSMENT AND PLAN  1. Atrial fibrillation with rapid ventricular response (HCC) - s/p Successful TEE/DCCV. Intolerant to amiodarone. Started on Sotalol 80mg  BID. Tikosyn is not covered. QTC of 538ms.  A paced rhythm on telemetry.  - CHADSVASc score of 3. Heparin d/c'd as INR therapeutic on  Coumadin. -  Continue BB and Sotolol load.   - QTc increased today but he was started on IV Levaquin CAP which we will d/c and change to another antibx that does not have QT prolongation effects.  2. LE edema - Still not recording output. Given Lasix IV yesterday.  Still has 2+ LE edema on exam.  Will continue Lasix 40mg  IV BID.  Needs strict I&O's.    3. Hx of symptomatic bradycardia s/p St. Jude pacemaker  4. CAD s/p LAD and RCA stent: stable, no anginal pain. Continue aspirin, statin and BB.   5.  PNA - noted on chest xray yesterday and started on IV Levaquin for CAP.  He is afebrile and WBC is normal. Will stop levaquin due to risk of worsening QT prolongation in setting of Sotolol.  He is PCN allergic so will start doxycycline 100mg  BID x 7 days per pharmacy recs.  Sueanne Margarita, MD  08/04/2015  9:03 AM

## 2015-08-04 NOTE — Progress Notes (Signed)
ANTICOAGULATION CONSULT NOTE - Follow Up Consult  Pharmacy Consult for Coumadin Indication: atrial fibrillation  Allergies  Allergen Reactions  . Amiodarone Nausea And Vomiting  . Simvastatin Other (See Comments)    Unknown reaction  . Penicillins Hives, Swelling and Rash    Patient Measurements: Height: 5\' 6"  (167.6 cm) Weight: 180 lb 11.2 oz (81.965 kg) IBW/kg (Calculated) : 63.8  Vital Signs: Temp: 98.5 F (36.9 C) (04/08 0511) Temp Source: Oral (04/08 0511) BP: 105/55 mmHg (04/08 0511) Pulse Rate: 62 (04/08 0511)  Labs:  Recent Labs  08/02/15 0207 08/02/15 0800 08/02/15 2104 08/03/15 0330 08/03/15 0335 08/03/15 1043 08/04/15 0422  HGB 10.9*  --   --  10.1*  --   --  10.5*  HCT 33.7*  --   --  31.0*  --   --  31.7*  PLT 401*  --   --  348  --   --  330  LABPROT 19.7*  --   --  23.5*  --   --  24.6*  INR 1.66*  --   --  2.11*  --   --  2.25*  HEPARINUNFRC  --  0.16* 0.20*  --  0.15*  --   --   CREATININE 0.76  --   --   --   --  0.86 0.77    Estimated Creatinine Clearance: 69.1 mL/min (by C-G formula based on Cr of 0.77).  Assessment: 86yom s/p epidural nerve block 4/4 continues on coumadin. Heparin bridge discontinued yesterday with therapeutic INR. INR remains at goal today 2.25. He received one dose of levaquin yesterday and has now been changed to doxycycline so will need to watch closely for drug interaction.  Home dose: 5mg  daily except 2.5mg  MWF  Goal of Therapy:  INR 2-3 Monitor platelets by anticoagulation protocol: Yes   Plan:  1) Coumadin 2.5mg  x 1 2) Daily INR  Deboraha Sprang 08/04/2015,11:25 AM

## 2015-08-04 NOTE — Progress Notes (Signed)
Pt not seen, d/w Dr.Turner, she already changed Abx from levaquin to Doxycycline due to prolonged QTC and PCN allergy, med FU not felt to be needed per Dr.Turner at this time. TRH will sign off Please call me if any further medical issues arise, should plan 7days of Abx  Domenic Polite

## 2015-08-05 ENCOUNTER — Other Ambulatory Visit: Payer: Self-pay | Admitting: Physician Assistant

## 2015-08-05 DIAGNOSIS — I5033 Acute on chronic diastolic (congestive) heart failure: Secondary | ICD-10-CM

## 2015-08-05 DIAGNOSIS — I110 Hypertensive heart disease with heart failure: Secondary | ICD-10-CM | POA: Diagnosis not present

## 2015-08-05 DIAGNOSIS — J189 Pneumonia, unspecified organism: Secondary | ICD-10-CM | POA: Diagnosis not present

## 2015-08-05 DIAGNOSIS — I480 Paroxysmal atrial fibrillation: Secondary | ICD-10-CM | POA: Diagnosis not present

## 2015-08-05 LAB — BASIC METABOLIC PANEL
ANION GAP: 9 (ref 5–15)
Anion gap: 9 (ref 5–15)
BUN: 20 mg/dL (ref 6–20)
BUN: 18 mg/dL (ref 6–20)
CHLORIDE: 98 mmol/L — AB (ref 101–111)
CHLORIDE: 99 mmol/L — AB (ref 101–111)
CO2: 29 mmol/L (ref 22–32)
CO2: 30 mmol/L (ref 22–32)
CREATININE: 0.79 mg/dL (ref 0.61–1.24)
Calcium: 8.7 mg/dL — ABNORMAL LOW (ref 8.9–10.3)
Calcium: 9.4 mg/dL (ref 8.9–10.3)
Creatinine, Ser: 0.76 mg/dL (ref 0.61–1.24)
GFR calc non Af Amer: >60 mL/min (ref >60)
Glucose, Bld: 97 mg/dL (ref 65–99)
Glucose, Bld: 71 mg/dL (ref 65–99)
POTASSIUM: 3.8 mmol/L (ref 3.5–5.1)
POTASSIUM: 4.1 mmol/L (ref 3.5–5.1)
SODIUM: 138 mmol/L (ref 135–145)
Sodium: 136 mmol/L (ref 135–145)

## 2015-08-05 LAB — PROTIME-INR
INR: 1.93 — ABNORMAL HIGH (ref 0.00–1.49)
PROTHROMBIN TIME: 22 s — AB (ref 11.6–15.2)

## 2015-08-05 LAB — MAGNESIUM: MAGNESIUM: 1.9 mg/dL (ref 1.7–2.4)

## 2015-08-05 MED ORDER — SOTALOL HCL 80 MG PO TABS: 80.0000 mg | ORAL_TABLET | Freq: Two times a day (BID) | ORAL | Status: DC

## 2015-08-05 MED ORDER — DOXYCYCLINE HYCLATE 100 MG PO TABS: 100.0000 mg | ORAL_TABLET | Freq: Two times a day (BID) | ORAL | Status: DC

## 2015-08-05 MED ORDER — OXYCODONE HCL 5 MG PO TABS
5.0000 mg | ORAL_TABLET | Freq: Once | ORAL | Status: AC
  Administered 2015-08-05: 5 mg via ORAL
  Filled 2015-08-05: qty 1

## 2015-08-05 MED ORDER — FUROSEMIDE 20 MG PO TABS
20.0000 mg | ORAL_TABLET | Freq: Every day | ORAL | Status: DC
Start: 2015-08-06 — End: 2016-01-10

## 2015-08-05 MED ORDER — FUROSEMIDE 20 MG PO TABS: 20.0000 mg | ORAL_TABLET | Freq: Every day | ORAL | Status: DC

## 2015-08-05 MED ORDER — POTASSIUM CHLORIDE CRYS ER 20 MEQ PO TBCR: 20.0000 meq | EXTENDED_RELEASE_TABLET | Freq: Every day | ORAL | Status: DC

## 2015-08-05 NOTE — Progress Notes (Signed)
SUBJECTIVE:  Feels good today.  No complaints  OBJECTIVE:   Vitals:   Filed Vitals:   08/04/15 0511 08/04/15 0912 08/04/15 2101 08/05/15 0519  BP: 105/55  110/52 112/59  Pulse: 62  62 64  Temp: 98.5 F (36.9 C)  98.6 F (37 C) 97.8 F (36.6 C)  TempSrc: Oral  Oral Oral  Resp: 20  18 18   Height:      Weight: 180 lb 11.2 oz (81.965 kg)   172 lb 1.6 oz (78.064 kg)  SpO2: 94% 95% 93% 96%   I&O's:   Intake/Output Summary (Last 24 hours) at 08/05/15 0846 Last data filed at 08/05/15 0009  Gross per 24 hour  Intake    720 ml  Output    800 ml  Net    -80 ml   TELEMETRY: Reviewed telemetry pt in A paced rhythm:     PHYSICAL EXAM General: Well developed, well nourished, in no acute distress Head: Eyes PERRLA, No xanthomas.   Normal cephalic and atramatic  Lungs:   Clear bilaterally to auscultation and percussion. Heart:   HRRR S1 S2 Pulses are 2+ & equal. Abdomen: Bowel sounds are positive, abdomen soft and non-tender without masses  Extremities:   No clubbing, cyanosis or edema.  DP +1 Neuro: Alert and oriented X 3. Psych:  Good affect, responds appropriately   LABS: Basic Metabolic Panel:  Recent Labs  08/03/15 1043 08/04/15 0422  NA 138 138  K 3.3* 4.2  CL 99* 101  CO2 30 28  GLUCOSE 99 92  BUN 19 18  CREATININE 0.86 0.77  CALCIUM 8.5* 8.5*  MG 1.9 2.1   Liver Function Tests: No results for input(s): AST, ALT, ALKPHOS, BILITOT, PROT, ALBUMIN in the last 72 hours. No results for input(s): LIPASE, AMYLASE in the last 72 hours. CBC:  Recent Labs  08/03/15 0330 08/04/15 0422  WBC 12.3* 10.0  HGB 10.1* 10.5*  HCT 31.0* 31.7*  MCV 87.1 87.1  PLT 348 330   Cardiac Enzymes: No results for input(s): CKTOTAL, CKMB, CKMBINDEX, TROPONINI in the last 72 hours. BNP: Invalid input(s): POCBNP D-Dimer: No results for input(s): DDIMER in the last 72 hours. Hemoglobin A1C: No results for input(s): HGBA1C in the last 72 hours. Fasting Lipid Panel: No  results for input(s): CHOL, HDL, LDLCALC, TRIG, CHOLHDL, LDLDIRECT in the last 72 hours. Thyroid Function Tests: No results for input(s): TSH, T4TOTAL, T3FREE, THYROIDAB in the last 72 hours.  Invalid input(s): FREET3 Anemia Panel: No results for input(s): VITAMINB12, FOLATE, FERRITIN, TIBC, IRON, RETICCTPCT in the last 72 hours. Coag Panel:   Lab Results  Component Value Date   INR 2.25* 08/04/2015   INR 1.93* 08/03/2015   INR 2.11* 08/03/2015    RADIOLOGY: Dg Chest 2 View  08/03/2015  CLINICAL DATA:  Wheezing with cough EXAM: CHEST  2 VIEW COMPARISON:  November 04, 2014 FINDINGS: There is interstitial and airspace consolidation throughout the superior, posterior, and lateral segments of the left lower lobe. There is underlying generalized interstitial prominence, likely representing a degree of fibrotic type change. The heart is upper normal in size with pulmonary vascularity within normal limits. No adenopathy. Pacemaker leads are attached to the right atrium and right ventricle. IMPRESSION: Interstitial and airspace consolidation left lower lobe, consistent with pneumonia. Underlying interstitial prominence, likely chronic fibrotic type change. A mild degree of chronic congestive heart failure, however, cannot be excluded. Followup PA and lateral chest radiographs recommended in 3-4 weeks following trial of antibiotic therapy to  ensure resolution and exclude underlying malignancy. Electronically Signed   By: Lowella Grip III M.D.   On: 08/03/2015 15:34   Ct Lumbar Spine Wo Contrast  07/09/2015  CLINICAL DATA:  Lumbar radiculopathy. Bilateral leg pain and numbness, worse on the left. Symptoms for 3 weeks. EXAM: CT LUMBAR SPINE WITHOUT CONTRAST TECHNIQUE: Multidetector CT imaging of the lumbar spine was performed without intravenous contrast administration. Multiplanar CT image reconstructions were also generated. COMPARISON:  None. FINDINGS: Lumbar spine is imaged from T11-12 through S2-3.  Leftward curvature lumbar spine is centered at L3-4. There is asymmetric right-sided endplate sclerotic change and a vacuum disc. Slight leftward listhesis is present at L4-5. AP alignment is anatomic. Atherosclerotic calcifications are present in the aorta without aneurysm. No other focal lesions are present. There is no significant adenopathy. L1-2: Mild facet hypertrophy is present bilaterally without significant stenosis. L2-3: A far left lateral disc protrusion is present. This contributes to mild left foraminal narrowing. Facet spurring is more prominent on the right. L3-4: A broad-based disc protrusion is present. Mild facet hypertrophy is worse on the right. Moderate right and mild left foraminal stenosis is present. Mild subarticular narrowing is worse on the right. L4-5: A broad-based disc protrusion is present. Moderate facet hypertrophy is noted bilaterally. The combination results in moderate subarticular and foraminal stenosis bilaterally, worse on the right. L5-S1: A leftward disc protrusion is present. Moderate left foraminal stenosis is evident. The central canal and right foramen are patent. Facet spurring is worse on the left. IMPRESSION: 1. The most significant left-sided disease is L5-S1 with moderate left foraminal stenosis secondary to a leftward disc protrusion and asymmetric facet hypertrophy. 2. Mild left foraminal narrowing at L2-3 due to a far left lateral disc protrusion. 3. Moderate right and mild left foraminal stenosis at L3-4. 4. Moderate subarticular and foraminal stenosis bilaterally at L4-5 is worse on the right. Electronically Signed   By: San Morelle M.D.   On: 07/09/2015 16:13    ASSESSMENT AND PLAN  1. Atrial fibrillation with rapid ventricular response (HCC) - s/p Successful TEE/DCCV. Intolerant to amiodarone. Started on Sotalol 80mg  BID. Tikosyn is not covered by insurance. QTC of 562ms. A paced rhythm on telemetry.  - CHADSVASc score of 3. Heparin d/c'd  as INR therapeutic on Coumadin. - Continue BB and Sotolol.  -  QTc increased yesterday but he was started on IV Levaquin CAP which was stopped.  Repeat EKG today.  If QTc ok then he is stable for discharge home.  2. LE edema - Given Lasix IV yesterday with 800cc UOP but is still + 2L.  LE edema has resolved and lungs are clear.   Will change to 20mg  PO daily.  3. Hx of symptomatic bradycardia s/p St. Jude pacemaker  4. CAD s/p LAD and RCA stent: stable, no anginal pain. Continue aspirin, statin and BB.   5. PNA - noted on chest xray yesterday and started on IV Levaquin for CAP. He is afebrile and WBC is normal. Levaquin stopped due to risk of worsening QT prolongation in setting of Sotolol. He is PCN allergic so now on doxycycline 100mg  BID x 7 days per pharmacy recs.  Will check 12 lead EKG and if QTc stable then discharge home.  Will need 7 days total of doxy and then repeat chest xray in a few weeks.  Followup in coumadin clinic next Tuesday.  Followup with Cr. Croitoru in 2 weeks.  Sueanne Margarita, MD  08/05/2015  8:46 AM

## 2015-08-05 NOTE — Anesthesia Postprocedure Evaluation (Signed)
Anesthesia Post Note  Patient: Steve Norris  Procedure(s) Performed: Procedure(s) (LRB): TRANSESOPHAGEAL ECHOCARDIOGRAM (TEE) (N/A) CARDIOVERSION (N/A)  Patient location during evaluation: Endoscopy Anesthesia Type: General Level of consciousness: awake Pain management: pain level controlled Vital Signs Assessment: post-procedure vital signs reviewed and stable Respiratory status: spontaneous breathing Cardiovascular status: stable Postop Assessment: no signs of nausea or vomiting Anesthetic complications: no    Last Vitals:  Filed Vitals:   08/04/15 2101 08/05/15 0519  BP: 110/52 112/59  Pulse: 62 64  Temp: 37 C 36.6 C  Resp: 18 18    Last Pain:  Filed Vitals:   08/05/15 1119  PainSc: 5                  Loris Seelye

## 2015-08-05 NOTE — Discharge Summary (Signed)
Discharge Summary    Patient ID: Steve Norris,  MRN: [id number], DOB/AGE: 1929-05-20 82 y.o.  Admit date: 08/01/2015 Discharge date: 08/05/2015  Primary Care Provider: PANG,RICHARD Primary Cardiologist: Dr. Sallyanne Kuster  Discharge Diagnoses    Principal Problem:   Atrial fibrillation with rapid ventricular response [facility]) Active Problems:   COPD (chronic obstructive pulmonary disease) ([place])   CAD (coronary artery disease)   S/P placement of cardiac pacemaker   Long term current use of anticoagulant therapy   CAP (community acquired pneumonia)   Allergies Allergies  Allergen Reactions  . Amiodarone Nausea And Vomiting  . Simvastatin Other (See Comments)    Unknown reaction  . Penicillins Hives, Swelling and Rash    Diagnostic Studies/Procedures    TEE DCCV 08/02/2015 LV EF: 55% - 60%  ------------------------------------------------------------------- History: PMH: Dyspnea. Atrial fibrillation. Coronary artery disease. Chronic obstructive pulmonary disease.  ------------------------------------------------------------------- Study Conclusions  - Left ventricle: Systolic function was normal. The estimated  ejection fraction was in the range of 55% to 60%. No evidence of  thrombus. - Aortic valve: There was trivial regurgitation. - Mitral valve: There was mild regurgitation. - Left atrium: The atrium was severely dilated. No evidence of  thrombus in the atrial cavity or appendage. There was spontaneous  echo contrast (&quot;smoke&quot;). The appendage was well visualized,  morphologically a left appendage, and of normal size. Emptying  velocity was reduced. - Right atrium: The atrium was severely dilated. There was  spontaneous echo contrast (&quot;smoke&quot;). - Tricuspid valve: There was moderate regurgitation. - Line: A venous pacing wire was visualized in the right atrial  cavity and right ventricular cavity, with its tip at the RV  apex.  Impressions:  - Successful cardioversion. No cardiac source of emboli was  indentified.  _____________   History of Present Illness     Steve Norris is a 83 y.o. male, followed by Dr. Sallyanne Kuster, with a history of PAF on Coumadin, bradycardia with St. Jude pacemaker, CAD with LAD and RCA stents, GERD and COPD. In July 2016, he was admitted for symptomatic atrial fibrillation w/ RVR. He was started on oral amiodarone and required DCCV on 11/06/14, which was successful. He was last seen in clinic 12/06/14 and was in [place].  Of note, his amiodarone was eventually stopped due to intolerance with nausea. He has since been on rate control only strategy with diltiazem and Bystolic. His INRs are followed in our Coumadin Clinic.   He recently underwent a spinal epidural 07/31/15. Prior to that, he was granted clearance by Dr. Sallyanne Kuster to hold Coumadin 5 days prior to procedure. Following the procedure yesterday he developed a rapid HR. Also with exertional dyspnea. He was advised by Dr. Sallyanne Kuster, via phone, to increase his diltiazem and present to the office for an EKG with a RN. This was preformed today and confirmed rapid afib with a rate of 146 bpm. Direct hospital admission was recommended with plans for TEE cardioversion. TEE would be required given his INR is sub therapeutic after holding Coumadin for procedure.   Also of importance, I have spoken with Dr. Sallyanne Kuster regarding patient. 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 The thought process is since he has failed amiodarone in the past and has required prior cardioversion, he will likely need another antiarrythmic to maintain SR. Per Dr. Sallyanne Kuster, the only options for this patient would be Tikosyn or sotalol, both of which would require  at least 72 hr hospital monitoring, which would be difficult for the patient if he is unable to find someone to help  care for his wife. If he is unable to find care for her and must go home after his cardioversion, he will need to go home with Lovenox for bridging until INR is therapeutic.   Hospital Course     Consultants: Internal Medicine Dr. Emeline Gins  He was admitted for atrial fibrillation with RVR and fluid overload. He was placed on IV Cardizem for rate control. Unfortunately given the fact that his Coumadin was held for recent procedure, his INR was subtherapeutic. Also he has failed amiodarone in the past, the only other option would be sotalol versus Tikosyn. He underwent TEE cardioversion on 08/02/2015, he had no evidence of thrombus in left atrial appendage, and subsequently underwent successful DC cardioversion. After discussing with Dr. Sallyanne Kuster who is the patient's primary cardiologist, initial plan was to start loading Tikosyn, however patient was concerned about cost. Apparently Tikosyn was not covered by his insurance, he had $4 copay for sotalol, therefore sotalol was initiated. Internal medicine service was consulted on 08/03/2015 for persistent cough, chest x-ray obtained showing evidence of left lower lobe pneumonia. He was started on IV Levaquin. After starting antibiotic, he did have QT prolongation. He was switched to doxycycline 100 mg twice a day for 7 days.  He was seen in the morning of 08/05/2015, his lower extremity edema has significantly improved, lung is clear, he has been switched to 20 mg oral Lasix. EKG was repeated, QTC 440. Electrolyte stable. He will need to follow-up in the Coumadin clinic next week and obtain BMET. I will arrange follow-up with Dr. Anner Crete in 2 weeks.  _____________  Discharge Vitals Blood pressure 112/59, pulse 64, temperature 97.8 F (36.6 C), temperature source Oral, resp. rate 18, height 5\' 6"  (1.676 m), weight 172 lb 1.6 oz (78.064 kg), SpO2 95 %.  Filed Weights   08/01/15 2012 08/04/15 0511 08/05/15 0519  Weight: 193 lb 8 oz (87.771 kg) 180 lb 11.2 oz  (81.965 kg) 172 lb 1.6 oz (78.064 kg)    Labs & Radiologic Studies     CBC  Recent Labs  08/03/15 0330 08/04/15 0422  WBC 12.3* 10.0  HGB 10.1* 10.5*  HCT 31.0* 31.7*  MCV 87.1 87.1  PLT 348 [id number]   Basic Metabolic Panel  Recent Labs  08/03/15 1043 08/04/15 0422 08/05/15 0934  NA 138 138 138  K 3.3* 4.2 4.1  CL 99* 101 99*  CO2 30 28 30   GLUCOSE 99 92 71  BUN 19 18 18   CREATININE 0.86 0.77 0.76  CALCIUM 8.5* 8.5* 9.4  MG 1.9 2.1  --     Dg Chest 2 View  08/03/2015  CLINICAL DATA:  Wheezing with cough EXAM: CHEST  2 VIEW COMPARISON:  November 04, 2014 FINDINGS: There is interstitial and airspace consolidation throughout the superior, posterior, and lateral segments of the left lower lobe. There is underlying generalized interstitial prominence, likely representing a degree of fibrotic type change. The heart is upper normal in size with pulmonary vascularity within normal limits. No adenopathy. Pacemaker leads are attached to the right atrium and right ventricle. IMPRESSION: Interstitial and airspace consolidation left lower lobe, consistent with pneumonia. Underlying interstitial prominence, likely chronic fibrotic type change. A mild degree of chronic congestive heart failure, however, cannot be excluded. Followup PA and lateral chest radiographs recommended in 3-4 weeks following trial of antibiotic therapy to ensure resolution and exclude  underlying malignancy. Electronically Signed   By: Lowella Grip III M.D.   On: 08/03/2015 15:34   Ct Lumbar Spine Wo Contrast  07/09/2015  CLINICAL DATA:  Lumbar radiculopathy. Bilateral leg pain and numbness, worse on the left. Symptoms for 3 weeks. EXAM: CT LUMBAR SPINE WITHOUT CONTRAST TECHNIQUE: Multidetector CT imaging of the lumbar spine was performed without intravenous contrast administration. Multiplanar CT image reconstructions were also generated. COMPARISON:  None. FINDINGS: Lumbar spine is imaged from T11-12 through S2-3. Leftward  curvature lumbar spine is centered at L3-4. There is asymmetric right-sided endplate sclerotic change and a vacuum disc. Slight leftward listhesis is present at L4-5. AP alignment is anatomic. Atherosclerotic calcifications are present in the aorta without aneurysm. No other focal lesions are present. There is no significant adenopathy. L1-2: Mild facet hypertrophy is present bilaterally without significant stenosis. L2-3: A far left lateral disc protrusion is present. This contributes to mild left foraminal narrowing. Facet spurring is more prominent on the right. L3-4: A broad-based disc protrusion is present. Mild facet hypertrophy is worse on the right. Moderate right and mild left foraminal stenosis is present. Mild subarticular narrowing is worse on the right. L4-5: A broad-based disc protrusion is present. Moderate facet hypertrophy is noted bilaterally. The combination results in moderate subarticular and foraminal stenosis bilaterally, worse on the right. L5-S1: A leftward disc protrusion is present. Moderate left foraminal stenosis is evident. The central canal and right foramen are patent. Facet spurring is worse on the left. IMPRESSION: 1. The most significant left-sided disease is L5-S1 with moderate left foraminal stenosis secondary to a leftward disc protrusion and asymmetric facet hypertrophy. 2. Mild left foraminal narrowing at L2-3 due to a far left lateral disc protrusion. 3. Moderate right and mild left foraminal stenosis at L3-4. 4. Moderate subarticular and foraminal stenosis bilaterally at L4-5 is worse on the right. Electronically Signed   By: San Morelle M.D.   On: 07/09/2015 16:13    Disposition   Pt is being discharged home today in good condition.  Follow-up Plans & Appointments    Follow-up Information    Follow up with Sanda Klein, MD.   Specialty:  Cardiology   Why:  office scheduler will contact you to arrange: 1. 5 day followup in coumadin clinic and obtain  BMET lab.  2. two weeks followup with Dr. Victorino December office   Contact information:   [address] [place] [address] [place] [address] [phone]      Discharge Instructions    Diet - low sodium heart healthy    Complete by:  As directed      Increase activity slowly    Complete by:  As directed            Discharge Medications   Current Discharge Medication List    START taking these medications   Details  doxycycline (VIBRA-TABS) 100 MG tablet Take 1 tablet (100 mg total) by mouth every 12 (twelve) hours. Qty: 14 tablet, Refills: 0    potassium chloride SA (K-DUR,KLOR-CON) 20 MEQ tablet Take 1 tablet (20 mEq total) by mouth daily. Qty: 30 tablet, Refills: 3    sotalol (BETAPACE) 80 MG tablet Take 1 tablet (80 mg total) by mouth every 12 (twelve) hours. Qty: 60 tablet, Refills: 5      CONTINUE these medications which have CHANGED   Details  furosemide (LASIX) 20 MG tablet Take 1 tablet (20 mg total) by mouth daily. Qty: 30 tablet, Refills: 3      CONTINUE these  medications which have NOT CHANGED   Details  acetaminophen (TYLENOL) 500 MG tablet Take 500 mg by mouth daily.     albuterol (PROVENTIL HFA;VENTOLIN HFA) 108 (90 BASE) MCG/ACT inhaler Inhale 2 puffs into the lungs every 4 (four) hours as needed for wheezing or shortness of breath. Reported on 08/01/2015    Ascorbic Acid (VITAMIN C) 1000 MG tablet Take 1,000 mg by mouth daily.    aspirin EC 81 MG tablet Take 81 mg by mouth daily.    b complex vitamins capsule Take 1 capsule by mouth daily.    budesonide-formoterol (SYMBICORT) 160-4.5 MCG/ACT inhaler Inhale 1-2 puffs into the lungs 2 (two) times daily. Inhale 2 puffs every morning and 1 puff at bedtime    Cholecalciferol (VITAMIN D) 2000 UNITS tablet Take 2,000 Units by mouth daily.    docusate sodium (COLACE) 100 MG capsule Take 100 mg by mouth at bedtime as needed (constipation).    Esomeprazole Magnesium (NEXIUM PO) Take 44.6 mg by mouth daily.     finasteride (PROSCAR) 5 MG tablet Take 5 mg by mouth at bedtime.     Multiple Vitamins-Minerals (HAIR/SKIN/NAILS) TABS Take 2 tablets by mouth daily. Reported on 08/01/2015    nebivolol (BYSTOLIC) 5 MG tablet Take 1 tablet (5 mg total) by mouth daily. Qty: 90 tablet, Refills: 1    neomycin-bacitracin-polymyxin (NEOSPORIN) OINT Apply 1 application topically daily as needed for wound care.    nitroGLYCERIN (NITROSTAT) 0.4 MG SL tablet Place 0.4 mg under the tongue every 5 (five) minutes as needed for chest pain.     Omega-3 Fatty Acids (FISH OIL) 1200 MG CAPS Take 2,400 mg by mouth daily.    senna-docusate (SENNA-S) 8.6-50 MG per tablet Take 1 tablet by mouth at bedtime.    temazepam (RESTORIL) 30 MG capsule Take 30 mg by mouth at bedtime.    vitamin E 400 UNIT capsule Take 400 Units by mouth daily.     warfarin (COUMADIN) 5 MG tablet take 1 tablet by mouth once daily OR AS DIRECTED BY COUMADIN CLINIC Qty: 90 tablet, Refills: 1    rosuvastatin (CRESTOR) 5 MG tablet Take 5 mg by mouth 3 (three) times a week. On Monday, Wednesday, and Friday nights      STOP taking these medications     diltiazem (CARTIA XT) 120 MG 24 hr capsule           Outstanding Labs/Studies   BMET in 1 week  Duration of Discharge Encounter   Greater than 30 minutes including physician time.  Hilbert Corrigan PA-C 08/05/2015, 12:54 PM

## 2015-08-06 ENCOUNTER — Encounter (HOSPITAL_COMMUNITY): Payer: Self-pay | Admitting: Cardiology

## 2015-08-08 LAB — CULTURE, BLOOD (ROUTINE X 2)
CULTURE: NO GROWTH
Culture: NO GROWTH

## 2015-08-13 ENCOUNTER — Telehealth: Payer: Self-pay | Admitting: Cardiovascular Disease

## 2015-08-13 NOTE — Telephone Encounter (Signed)
Pt is calling in requesting that he receive another Chest X-Ray because he says that he feels the pneumonia is still lingering. He says that he still spitting up thick phlegm and having some SOB. Please f/u with him   Thanks

## 2015-08-13 NOTE — Telephone Encounter (Signed)
Left message on patient voicemail   will need to contact primary doctor ( Dr Minna Antis) - to order chest x ray. Any question may call back

## 2015-08-14 ENCOUNTER — Ambulatory Visit (INDEPENDENT_AMBULATORY_CARE_PROVIDER_SITE_OTHER): Payer: PPO | Admitting: Pharmacist Clinician (PhC)/ Clinical Pharmacy Specialist

## 2015-08-14 DIAGNOSIS — Z7901 Long term (current) use of anticoagulants: Secondary | ICD-10-CM | POA: Diagnosis not present

## 2015-08-14 DIAGNOSIS — I4891 Unspecified atrial fibrillation: Secondary | ICD-10-CM | POA: Diagnosis not present

## 2015-08-14 LAB — POCT INR: INR: 3.4

## 2015-08-16 DIAGNOSIS — J181 Lobar pneumonia, unspecified organism: Secondary | ICD-10-CM | POA: Diagnosis not present

## 2015-08-16 DIAGNOSIS — I4891 Unspecified atrial fibrillation: Secondary | ICD-10-CM | POA: Diagnosis not present

## 2015-08-16 DIAGNOSIS — J189 Pneumonia, unspecified organism: Secondary | ICD-10-CM | POA: Diagnosis not present

## 2015-08-16 DIAGNOSIS — M4800 Spinal stenosis, site unspecified: Secondary | ICD-10-CM | POA: Diagnosis not present

## 2015-08-16 DIAGNOSIS — R05 Cough: Secondary | ICD-10-CM | POA: Diagnosis not present

## 2015-08-20 DIAGNOSIS — M5416 Radiculopathy, lumbar region: Secondary | ICD-10-CM | POA: Diagnosis not present

## 2015-08-24 DIAGNOSIS — M5416 Radiculopathy, lumbar region: Secondary | ICD-10-CM | POA: Diagnosis not present

## 2015-08-31 ENCOUNTER — Ambulatory Visit (INDEPENDENT_AMBULATORY_CARE_PROVIDER_SITE_OTHER): Payer: PPO | Admitting: Pharmacist Clinician (PhC)/ Clinical Pharmacy Specialist

## 2015-08-31 ENCOUNTER — Ambulatory Visit (INDEPENDENT_AMBULATORY_CARE_PROVIDER_SITE_OTHER): Payer: PPO | Admitting: Cardiovascular Disease

## 2015-08-31 ENCOUNTER — Encounter: Payer: Self-pay | Admitting: Cardiovascular Disease

## 2015-08-31 VITALS — BP 130/72 | HR 62 | Ht 69.5 in | Wt 171.5 lb

## 2015-08-31 DIAGNOSIS — I4891 Unspecified atrial fibrillation: Secondary | ICD-10-CM | POA: Diagnosis not present

## 2015-08-31 DIAGNOSIS — J449 Chronic obstructive pulmonary disease, unspecified: Secondary | ICD-10-CM

## 2015-08-31 DIAGNOSIS — Z7901 Long term (current) use of anticoagulants: Secondary | ICD-10-CM

## 2015-08-31 DIAGNOSIS — I2510 Atherosclerotic heart disease of native coronary artery without angina pectoris: Secondary | ICD-10-CM

## 2015-08-31 DIAGNOSIS — Z950 Presence of cardiac pacemaker: Secondary | ICD-10-CM | POA: Diagnosis not present

## 2015-08-31 LAB — POCT INR: INR: 4.3

## 2015-08-31 NOTE — Patient Instructions (Addendum)
Dr Sallyanne Kuster recommends that you continue on your current medications as directed. Please refer to the Current Medication list given to you today.  Remote monitoring is used to monitor your Pacemaker of ICD from home. This monitoring reduces the number of office visits required to check your device to one time per year. It allows [place] to keep an eye on the functioning of your device to ensure it is working properly. You are scheduled for a device check from home on Monday, August 7th, 2017. You may send your transmission at any time that day. If you have a wireless device, the transmission will be sent automatically. After your physician reviews your transmission, you will receive a postcard with your next transmission date.  Dr Sallyanne Kuster recommends that you schedule a follow-up appointment in 6 months. You will receive a reminder letter in the mail two months in advance. If you don't receive a letter, please call our office to schedule the follow-up appointment.  If you need a refill on your cardiac medications before your next appointment, please call your pharmacy.

## 2015-08-31 NOTE — Progress Notes (Signed)
Patient ID: Steve Norris, male   DOB: 07-Dec-1929, 82 y.o.   MRN: [id number]    Cardiology Office Note    Date:  08/31/2015   ID:  Steve Norris, DOB 1929-12-22, MRN [id number]  PCP:  Horatio Pel, MD  Cardiologist:   Sanda Klein, MD   Chief Complaint  Patient presents with  . Hospitalization Follow-up    wants to discuss back surgery, no chest pain, has pain and cramping in legs, occassional lightheaded or dizziness, has edema    History of Present Illness:  Steve Norris is a 82 y.o. male with a history of sinus node dysfunction and tachycardia bradycardia syndrome with recurrent paroxysmal atrial fibrillation, returning in follow-up after undergoing electrical cardioversion for a persistent episode of atrial fibrillation with rapid ventricular response on April 6. He also has coronary artery disease with previous stents (LAD and RCA 2006, but without any coronary revascularization in over 10 years). He has long-standing COPD and chronic shortness of breath, functional class II-III. Since his last appointment he has not had any cardiac complaints.  He is suffering from severe low back pain which has led to substantial reduction and quality of life. He has lost a lot of weight, is very inactive and is contemplating lumbar spine surgery. He denies angina pectoris, worsening dyspnea, palpitations, leg edema, syncope, focal neurological deficits, bleeding problems, claudication.  Interrogation of his pacemaker today shows a single significant episode of atrial fibrillation since his cardioversion. He had an episode of almost 4 hours on April 26, asymptomatic. Otherwise he has 86% atrial pacing and only 5% ventricular pacing. The overall burden of atrial fibrillation has been 9%, almost entirely made up by his episode of persistent A. fib in early April. His dual-chamber St. Jude accent DR RF device, which was implanted in 2011 has an estimated longevity of about another 5.8 years.    Anticoagulation level is slightly supratherapeutic today. He has normal left ventricular systolic function. In 2015 he underwent coronary angiography that showed widely patent Cypher stents in the LAD and RCA.  Past Medical History  Diagnosis Date  . Hiatal hernia 03-19-2009    EGD  . Erosive esophagitis 03-19-2009    EGD  . Barrett's esophagus 03-19-2009    EGD  . CAD (coronary artery disease)   . Paroxysmal atrial fibrillation (HCC)   . Internal hemorrhoids 03-19-2009    Colonoscopy  . Diverticulosis 03-19-2009    Colonoscopy   . Arthritis   . Asthma   . Cataract     REMOVED  . COPD (chronic obstructive pulmonary disease) ([place])   . GERD (gastroesophageal reflux disease)   . Hyperlipidemia   . Hypertension     Past Surgical History  Procedure Laterality Date  . Vasectomy    . Hemorrhoid surgery    . Tonsillectomy    . Coronary stent placement  2006    Right Coronary Artery Cypher stents placed 2006  . Cataract extraction    . Colonoscopy    . Pacemaker insertion  07/25/2009    St.Jude Accent  . Cardiac catheterization  02/12/2009    mild ostial left main disease,patent LAD & RCA stents   . US echocardiography  07/12/2009    borderline LA enlargement,mild mitral annular ca+, AOV mildly sclerotic,trace AI.  Marland Kitchen Nm myocar perf wall motion  08/14/2008    no significant ischemia EF 64%  . Left heart catheterization with coronary angiogram N/A 07/01/2013    Procedure: LEFT HEART CATHETERIZATION WITH CORONARY ANGIOGRAM;  Surgeon: Sanda Klein, MD;  Location: [facility] CATH LAB;  Service: Cardiovascular;  Laterality: N/A;  . Cardioversion N/A 11/06/2014    Procedure: CARDIOVERSION;  Surgeon: [name], MD;  Location: [facility] ENDOSCOPY;  Service: Cardiovascular;  Laterality: N/A;  . Tee without cardioversion N/A 08/02/2015    Procedure: TRANSESOPHAGEAL ECHOCARDIOGRAM (TEE);  Surgeon: Sueanne Margarita, MD;  Location: [facility] ENDOSCOPY;  Service: Cardiovascular;  Laterality: N/A;  .  Cardioversion N/A 08/02/2015    Procedure: CARDIOVERSION;  Surgeon: Sueanne Margarita, MD;  Location: [facility] ENDOSCOPY;  Service: Cardiovascular;  Laterality: N/A;    Current Medications: Outpatient Prescriptions Prior to Visit  Medication Sig Dispense Refill  . albuterol (PROVENTIL HFA;VENTOLIN HFA) 108 (90 BASE) MCG/ACT inhaler Inhale 2 puffs into the lungs every 4 (four) hours as needed for wheezing or shortness of breath. Reported on 08/01/2015    . Ascorbic Acid (VITAMIN C) 1000 MG tablet Take 1,000 mg by mouth daily.    Marland Kitchen aspirin EC 81 MG tablet Take 81 mg by mouth daily.    Marland Kitchen b complex vitamins capsule Take 1 capsule by mouth daily.    . budesonide-formoterol (SYMBICORT) 160-4.5 MCG/ACT inhaler Inhale 1-2 puffs into the lungs 2 (two) times daily. Inhale 2 puffs every morning and 1 puff at bedtime    . Cholecalciferol (VITAMIN D) 2000 UNITS tablet Take 2,000 Units by mouth daily.    . Esomeprazole Magnesium (NEXIUM PO) Take 44.6 mg by mouth daily.    . finasteride (PROSCAR) 5 MG tablet Take 5 mg by mouth at bedtime.     . furosemide (LASIX) 20 MG tablet Take 1 tablet (20 mg total) by mouth daily. 30 tablet 3  . Multiple Vitamins-Minerals (HAIR/SKIN/NAILS) TABS Take 2 tablets by mouth daily. Reported on 08/01/2015    . nebivolol (BYSTOLIC) 5 MG tablet Take 1 tablet (5 mg total) by mouth daily. 90 tablet 1  . neomycin-bacitracin-polymyxin (NEOSPORIN) OINT Apply 1 application topically daily as needed for wound care. Reported on 08/31/2015    . nitroGLYCERIN (NITROSTAT) 0.4 MG SL tablet Place 0.4 mg under the tongue every 5 (five) minutes as needed for chest pain.     . Omega-3 Fatty Acids (FISH OIL) 1200 MG CAPS Take 2,400 mg by mouth daily.    . potassium chloride SA (K-DUR,KLOR-CON) 20 MEQ tablet Take 1 tablet (20 mEq total) by mouth daily. 30 tablet 3  . rosuvastatin (CRESTOR) 5 MG tablet Take 5 mg by mouth 3 (three) times a week. Reported on 08/31/2015    . senna-docusate (SENNA-S) 8.6-50 MG per tablet  Take 1 tablet by mouth at bedtime.    . sotalol (BETAPACE) 80 MG tablet Take 1 tablet (80 mg total) by mouth every 12 (twelve) hours. 60 tablet 5  . temazepam (RESTORIL) 30 MG capsule Take 30 mg by mouth at bedtime.    . vitamin E 400 UNIT capsule Take 400 Units by mouth daily.     Marland Kitchen warfarin (COUMADIN) 5 MG tablet take 1 tablet by mouth once daily OR AS DIRECTED BY COUMADIN CLINIC 90 tablet 1  . acetaminophen (TYLENOL) 500 MG tablet Take 500 mg by mouth daily. Reported on 08/31/2015    . docusate sodium (COLACE) 100 MG capsule Take 100 mg by mouth at bedtime as needed (constipation). Reported on 08/31/2015    . doxycycline (VIBRA-TABS) 100 MG tablet Take 1 tablet (100 mg total) by mouth every 12 (twelve) hours. (Patient not taking: Reported on 08/31/2015) 14 tablet 0   No facility-administered medications  prior to visit.     Allergies:   Amiodarone; Simvastatin; and Penicillins   Social History   Social History  . Marital Status: Married    Spouse Name: N/A  . Number of Children: N/A  . Years of Education: N/A   Occupational History  . Retired     Social History Main Topics  . Smoking status: Former Research scientist (life sciences)  . Smokeless tobacco: Never Used     Comment: Quit smoking 1996  . Alcohol Use: No  . Drug Use: No  . Sexual Activity: Not Asked   Other Topics Concern  . None   Social History Narrative   Daily caffeine      Family History:  The patient's family history includes Heart disease in his father and mother. There is no history of Colon cancer.   ROS:   Please see the history of present illness.    ROS All other systems reviewed and are negative.   PHYSICAL EXAM:   VS:  BP 130/72 mmHg  Pulse 62  Ht 5' 9.5" (1.765 m)  Wt 77.792 kg (171 lb 8 oz)  BMI 24.97 kg/m2   GEN: Well nourished, well developed, in no acute distress HEENT: normal Neck: no JVD, carotid bruits, or masses Cardiac: RRR; no murmurs, rubs, or gallops,no edema  Respiratory:  clear to auscultation  bilaterally, normal work of breathing GI: soft, nontender, nondistended, + BS MS: no deformity or atrophy Skin: warm and dry, no rash Neuro:  Alert and Oriented x 3, Strength and sensation are intact Psych: euthymic mood, full affect  Wt Readings from Last 3 Encounters:  08/31/15 77.792 kg (171 lb 8 oz)  08/05/15 78.064 kg (172 lb 1.6 oz)  12/06/14 85.231 kg (187 lb 14.4 oz)      Studies/Labs Reviewed:   EKG:  EKG is not ordered today.  The intracardiac electrogram today demonstrates atrial paced, ventricular sensed rhythm  Recent Labs: 08/01/2015: TSH 0.609 08/04/2015: Hemoglobin 10.5*; Magnesium 2.1; Platelets 330 08/05/2015: BUN 18; Creatinine, Ser 0.76; Potassium 4.1; Sodium 138     ASSESSMENT:    1. Atrial fibrillation with rapid ventricular response ([place])   2. Coronary artery disease involving native coronary artery of native heart without angina pectoris   3. Long term current use of anticoagulant therapy   4. S/P placement of cardiac pacemaker   5. Chronic obstructive pulmonary disease, unspecified COPD type ([place])      PLAN:  In order of problems listed above:  1. AFib: He has had a single episode of paroxysmal atrial flutter that was self terminated in the last month. Continue warfarin anticoagulation, with dose adjusted for his INR today. I think his warfarin can be interrupted with relatively low risk, if he decides to have lumbar spine surgery. CHADSVasc 4 (age 89, HTN, CAD). Tinea sotalol with periodic review of QT interval, renal function and electrolytes. 2. CAD: This has been a very quiesced and disorder. After receiving his stents in 2006 has not had any clinical events. His coronary angiogram in 2015 showed the stents to be widely patent. He is asymptomatic 3. Warfarin: I think the recent elevation in his INR may be related to substantial weight loss, possibly increased use of analgesics. I asked him to stop the aspirin to reduce the bleeding risk. 4. PPM: Normal  device function, remote download every 3 months and office visit in 6 months 5. COPD: This appears to be at baseline. He is not wheezing today.    Medication Adjustments/Labs and Tests  Ordered: Current medicines are reviewed at length with the patient today.  Concerns regarding medicines are outlined above.  Medication changes, Labs and Tests ordered today are listed in the Patient Instructions below. Patient Instructions  Dr Sallyanne Kuster recommends that you continue on your current medications as directed. Please refer to the Current Medication list given to you today.  Remote monitoring is used to monitor your Pacemaker of ICD from home. This monitoring reduces the number of office visits required to check your device to one time per year. It allows [place] to keep an eye on the functioning of your device to ensure it is working properly. You are scheduled for a device check from home on Monday, August 7th, 2017. You may send your transmission at any time that day. If you have a wireless device, the transmission will be sent automatically. After your physician reviews your transmission, you will receive a postcard with your next transmission date.  Dr Sallyanne Kuster recommends that you schedule a follow-up appointment in 6 months. You will receive a reminder letter in the mail two months in advance. If you don't receive a letter, please call our office to schedule the follow-up appointment.  If you need a refill on your cardiac medications before your next appointment, please call your pharmacy.    Mikael Spray, MD  08/31/2015 7:13 PM    [facility] [place], [place], [place]  [address] Phone: [phone]; Fax: [phone]

## 2015-09-04 ENCOUNTER — Telehealth: Payer: Self-pay | Admitting: Cardiovascular Disease

## 2015-09-04 NOTE — Telephone Encounter (Signed)
New Message  Pt forgot to mention in list of medications @ last OV- has started hydr0codone in April for back pain. Please call back and discuss.

## 2015-09-04 NOTE — Telephone Encounter (Signed)
Spoke with pt, chart updated with current hydrocodone dose

## 2015-09-06 DIAGNOSIS — J189 Pneumonia, unspecified organism: Secondary | ICD-10-CM | POA: Diagnosis not present

## 2015-09-06 DIAGNOSIS — J181 Lobar pneumonia, unspecified organism: Secondary | ICD-10-CM | POA: Diagnosis not present

## 2015-09-07 DIAGNOSIS — M25561 Pain in right knee: Secondary | ICD-10-CM | POA: Diagnosis not present

## 2015-09-07 DIAGNOSIS — M25562 Pain in left knee: Secondary | ICD-10-CM | POA: Diagnosis not present

## 2015-09-07 DIAGNOSIS — M4806 Spinal stenosis, lumbar region: Secondary | ICD-10-CM | POA: Diagnosis not present

## 2015-09-10 DIAGNOSIS — M25561 Pain in right knee: Secondary | ICD-10-CM | POA: Diagnosis not present

## 2015-09-10 DIAGNOSIS — M4806 Spinal stenosis, lumbar region: Secondary | ICD-10-CM | POA: Diagnosis not present

## 2015-09-10 DIAGNOSIS — M25562 Pain in left knee: Secondary | ICD-10-CM | POA: Diagnosis not present

## 2015-09-12 ENCOUNTER — Other Ambulatory Visit: Payer: Self-pay | Admitting: Orthopedic Surgery

## 2015-09-12 DIAGNOSIS — G8929 Other chronic pain: Secondary | ICD-10-CM

## 2015-09-12 DIAGNOSIS — M533 Sacrococcygeal disorders, not elsewhere classified: Principal | ICD-10-CM

## 2015-09-13 ENCOUNTER — Telehealth: Payer: Self-pay

## 2015-09-13 NOTE — Telephone Encounter (Signed)
Request for surgical clearance:   1. What type surgery is being performed? CT SI joint injection  2. When is this surgery scheduled? pending  3. Are there any medications that need to be held prior to surgery and how long? Warfarin 5 mg and ASA 81 mg  4. Name of the physician performing surgery:   5. What is the office phone and fax number? [place]  Phone [phone]  Fax [phone]

## 2015-09-14 ENCOUNTER — Encounter: Payer: Self-pay | Admitting: Cardiovascular Disease

## 2015-09-14 NOTE — Telephone Encounter (Signed)
Letter in epic. If possible, when [facility] asks for clearance try to get the name of the physician doing the procedure. Can't send via Epic otherwise. I addressed this letter to Dr. Shelia Media. MCr

## 2015-09-17 DIAGNOSIS — M5416 Radiculopathy, lumbar region: Secondary | ICD-10-CM | POA: Diagnosis not present

## 2015-09-18 ENCOUNTER — Encounter: Payer: PPO | Admitting: Pharmacist Clinician (PhC)/ Clinical Pharmacy Specialist

## 2015-09-18 ENCOUNTER — Encounter: Payer: PPO | Admitting: Cardiovascular Disease

## 2015-09-18 ENCOUNTER — Ambulatory Visit (INDEPENDENT_AMBULATORY_CARE_PROVIDER_SITE_OTHER): Payer: PPO | Admitting: Pharmacist

## 2015-09-18 DIAGNOSIS — Z7901 Long term (current) use of anticoagulants: Secondary | ICD-10-CM

## 2015-09-18 DIAGNOSIS — I4891 Unspecified atrial fibrillation: Secondary | ICD-10-CM | POA: Diagnosis not present

## 2015-09-18 LAB — CUP PACEART INCLINIC DEVICE CHECK
Date Time Interrogation Session: 20170523120629
Implantable Lead Implant Date: 20110330
Implantable Lead Location: 753859
Lead Channel Setting Pacing Pulse Width: 0.4 ms
Lead Channel Setting Sensing Sensitivity: 2 mV
MDC IDC LEAD IMPLANT DT: 20110330
MDC IDC LEAD LOCATION: 753860
MDC IDC PG SERIAL: 7124006
MDC IDC SET LEADCHNL RA PACING AMPLITUDE: 2 V
MDC IDC SET LEADCHNL RV PACING AMPLITUDE: 2.5 V

## 2015-09-18 LAB — POCT INR: INR: 1.9

## 2015-09-19 ENCOUNTER — Encounter: Payer: Self-pay | Admitting: Cardiovascular Disease

## 2015-09-25 ENCOUNTER — Telehealth: Payer: Self-pay | Admitting: Pharmacist

## 2015-09-25 ENCOUNTER — Telehealth: Payer: Self-pay | Admitting: Cardiovascular Disease

## 2015-09-25 NOTE — Telephone Encounter (Signed)
ERROR

## 2015-09-25 NOTE — Telephone Encounter (Signed)
Returned call and spoke to patient about holding Coumadin for spinal block procedure on 6/8. He is scheduled to have INR checked on 6/7 the day before the procedure to ensure INR is <1.5. He is very excited about having the procedure and hoping it will improve his pain.

## 2015-09-26 NOTE — Telephone Encounter (Signed)
Faxed clearance letter to [facility].

## 2015-10-03 ENCOUNTER — Ambulatory Visit (INDEPENDENT_AMBULATORY_CARE_PROVIDER_SITE_OTHER): Payer: PPO | Admitting: Pharmacist Clinician (PhC)/ Clinical Pharmacy Specialist

## 2015-10-03 DIAGNOSIS — Z7901 Long term (current) use of anticoagulants: Secondary | ICD-10-CM

## 2015-10-03 DIAGNOSIS — I4891 Unspecified atrial fibrillation: Secondary | ICD-10-CM | POA: Diagnosis not present

## 2015-10-03 LAB — POCT INR: INR: 1.1

## 2015-10-04 ENCOUNTER — Ambulatory Visit
Admission: RE | Admit: 2015-10-04 | Discharge: 2015-10-04 | Disposition: A | Discharge status: Home / Self Care | Payer: PPO | Source: Ambulatory Visit | Attending: Orthopedic Surgery | Admitting: Orthopedic Surgery

## 2015-10-04 DIAGNOSIS — M533 Sacrococcygeal disorders, not elsewhere classified: Principal | ICD-10-CM

## 2015-10-04 DIAGNOSIS — M25552 Pain in left hip: Secondary | ICD-10-CM | POA: Diagnosis not present

## 2015-10-04 DIAGNOSIS — G8929 Other chronic pain: Secondary | ICD-10-CM

## 2015-10-08 DIAGNOSIS — R601 Generalized edema: Secondary | ICD-10-CM | POA: Diagnosis not present

## 2015-10-08 DIAGNOSIS — M533 Sacrococcygeal disorders, not elsewhere classified: Secondary | ICD-10-CM | POA: Diagnosis not present

## 2015-10-10 ENCOUNTER — Other Ambulatory Visit: Payer: Self-pay | Admitting: Orthopedic Surgery

## 2015-10-10 DIAGNOSIS — M545 Low back pain: Secondary | ICD-10-CM

## 2015-10-15 ENCOUNTER — Telehealth: Payer: Self-pay | Admitting: Cardiovascular Disease

## 2015-10-15 NOTE — Telephone Encounter (Signed)
New Message  Pt wnted to speak w/ Coumadin- about possibly changing coum appt for closer to CT- wanted to discuss w/ first, before changing appt. Please call back and discuss.

## 2015-10-15 NOTE — Telephone Encounter (Signed)
Appt changed to day of procedure.

## 2015-10-24 ENCOUNTER — Ambulatory Visit
Admission: RE | Admit: 2015-10-24 | Discharge: 2015-10-24 | Disposition: A | Discharge status: Home / Self Care | Payer: PPO | Source: Ambulatory Visit | Attending: Orthopedic Surgery | Admitting: Orthopedic Surgery

## 2015-10-24 ENCOUNTER — Ambulatory Visit (INDEPENDENT_AMBULATORY_CARE_PROVIDER_SITE_OTHER): Payer: PPO | Admitting: Pharmacist Clinician (PhC)/ Clinical Pharmacy Specialist

## 2015-10-24 DIAGNOSIS — I4891 Unspecified atrial fibrillation: Secondary | ICD-10-CM

## 2015-10-24 DIAGNOSIS — Z7901 Long term (current) use of anticoagulants: Secondary | ICD-10-CM | POA: Diagnosis not present

## 2015-10-24 DIAGNOSIS — M545 Low back pain: Secondary | ICD-10-CM

## 2015-10-24 LAB — POCT INR: INR: 1.1

## 2015-11-02 DIAGNOSIS — M25552 Pain in left hip: Secondary | ICD-10-CM | POA: Diagnosis not present

## 2015-11-02 DIAGNOSIS — R3915 Urgency of urination: Secondary | ICD-10-CM | POA: Diagnosis not present

## 2015-11-02 DIAGNOSIS — I4891 Unspecified atrial fibrillation: Secondary | ICD-10-CM | POA: Diagnosis not present

## 2015-11-07 ENCOUNTER — Ambulatory Visit (INDEPENDENT_AMBULATORY_CARE_PROVIDER_SITE_OTHER): Payer: PPO | Admitting: Pharmacist

## 2015-11-07 DIAGNOSIS — I4891 Unspecified atrial fibrillation: Secondary | ICD-10-CM

## 2015-11-07 DIAGNOSIS — Z7901 Long term (current) use of anticoagulants: Secondary | ICD-10-CM | POA: Diagnosis not present

## 2015-11-07 LAB — POCT INR: INR: 1.8

## 2015-11-14 DIAGNOSIS — R634 Abnormal weight loss: Secondary | ICD-10-CM | POA: Diagnosis not present

## 2015-11-14 DIAGNOSIS — N401 Enlarged prostate with lower urinary tract symptoms: Secondary | ICD-10-CM | POA: Diagnosis not present

## 2015-11-14 DIAGNOSIS — M533 Sacrococcygeal disorders, not elsewhere classified: Secondary | ICD-10-CM | POA: Diagnosis not present

## 2015-11-14 DIAGNOSIS — R3915 Urgency of urination: Secondary | ICD-10-CM | POA: Diagnosis not present

## 2015-11-21 ENCOUNTER — Ambulatory Visit (INDEPENDENT_AMBULATORY_CARE_PROVIDER_SITE_OTHER): Payer: PPO | Admitting: Pharmacist Clinician (PhC)/ Clinical Pharmacy Specialist

## 2015-11-21 DIAGNOSIS — I4891 Unspecified atrial fibrillation: Secondary | ICD-10-CM | POA: Diagnosis not present

## 2015-11-21 DIAGNOSIS — Z7901 Long term (current) use of anticoagulants: Secondary | ICD-10-CM | POA: Diagnosis not present

## 2015-11-21 LAB — POCT INR: INR: 2.1

## 2015-11-25 ENCOUNTER — Other Ambulatory Visit: Payer: Self-pay | Admitting: Cardiovascular Disease

## 2015-11-30 ENCOUNTER — Ambulatory Visit (INDEPENDENT_AMBULATORY_CARE_PROVIDER_SITE_OTHER): Payer: PPO | Admitting: *Deleted

## 2015-11-30 DIAGNOSIS — I480 Paroxysmal atrial fibrillation: Secondary | ICD-10-CM

## 2015-11-30 NOTE — Progress Notes (Signed)
Remote pacemaker transmission.   

## 2015-12-04 DIAGNOSIS — R634 Abnormal weight loss: Secondary | ICD-10-CM | POA: Diagnosis not present

## 2015-12-05 ENCOUNTER — Encounter: Payer: Self-pay | Admitting: Cardiology

## 2015-12-06 DIAGNOSIS — Z23 Encounter for immunization: Secondary | ICD-10-CM | POA: Diagnosis not present

## 2015-12-06 DIAGNOSIS — R634 Abnormal weight loss: Secondary | ICD-10-CM | POA: Diagnosis not present

## 2015-12-06 DIAGNOSIS — N401 Enlarged prostate with lower urinary tract symptoms: Secondary | ICD-10-CM | POA: Diagnosis not present

## 2015-12-06 DIAGNOSIS — M545 Low back pain: Secondary | ICD-10-CM | POA: Diagnosis not present

## 2015-12-06 DIAGNOSIS — R700 Elevated erythrocyte sedimentation rate: Secondary | ICD-10-CM | POA: Diagnosis not present

## 2015-12-07 DIAGNOSIS — H52203 Unspecified astigmatism, bilateral: Secondary | ICD-10-CM | POA: Diagnosis not present

## 2015-12-07 DIAGNOSIS — H26491 Other secondary cataract, right eye: Secondary | ICD-10-CM | POA: Diagnosis not present

## 2015-12-13 ENCOUNTER — Encounter (HOSPITAL_COMMUNITY): Payer: Self-pay | Admitting: *Deleted

## 2015-12-13 ENCOUNTER — Emergency Department (HOSPITAL_COMMUNITY)
Admission: EM | Admit: 2015-12-13 | Discharge: 2015-12-13 | Disposition: A | Discharge status: Home / Self Care | Payer: PPO | Attending: Emergency Medicine | Admitting: Emergency Medicine

## 2015-12-13 DIAGNOSIS — R197 Diarrhea, unspecified: Secondary | ICD-10-CM | POA: Insufficient documentation

## 2015-12-13 DIAGNOSIS — Z5321 Procedure and treatment not carried out due to patient leaving prior to being seen by health care provider: Secondary | ICD-10-CM | POA: Diagnosis not present

## 2015-12-13 LAB — COMPREHENSIVE METABOLIC PANEL WITH GFR
ALT: 15 U/L — ABNORMAL LOW (ref 17–63)
AST: 20 U/L (ref 15–41)
Albumin: 2.7 g/dL — ABNORMAL LOW (ref 3.5–5.0)
Alkaline Phosphatase: 72 U/L (ref 38–126)
Anion gap: 6 (ref 5–15)
BUN: 9 mg/dL (ref 6–20)
CO2: 25 mmol/L (ref 22–32)
Calcium: 8.7 mg/dL — ABNORMAL LOW (ref 8.9–10.3)
Chloride: 102 mmol/L (ref 101–111)
Creatinine, Ser: 0.63 mg/dL (ref 0.61–1.24)
GFR calc Af Amer: >60 mL/min
GFR calc non Af Amer: >60 mL/min
Glucose, Bld: 104 mg/dL — ABNORMAL HIGH (ref 65–99)
Potassium: 4 mmol/L (ref 3.5–5.1)
Sodium: 133 mmol/L — ABNORMAL LOW (ref 135–145)
Total Bilirubin: 0.5 mg/dL (ref 0.3–1.2)
Total Protein: 7.2 g/dL (ref 6.5–8.1)

## 2015-12-13 LAB — CBC
HEMATOCRIT: 35.6 % — AB (ref 39.0–52.0)
HEMOGLOBIN: 11.7 g/dL — AB (ref 13.0–17.0)
MCH: 30.2 pg (ref 26.0–34.0)
MCHC: 32.9 g/dL (ref 30.0–36.0)
MCV: 92 fL (ref 78.0–100.0)
Platelets: 384 10*3/uL (ref 150–400)
RBC: 3.87 MIL/uL — ABNORMAL LOW (ref 4.22–5.81)
RDW: 16.1 % — ABNORMAL HIGH (ref 11.5–15.5)
WBC: 10.5 10*3/uL (ref 4.0–10.5)

## 2015-12-13 LAB — LIPASE, BLOOD: Lipase: 16 U/L (ref 11–51)

## 2015-12-13 NOTE — ED Triage Notes (Signed)
Pt reports having watery diarrhea x 1 week. Minimal relief with immodium. Denies n/v. Reports starting on flomax 3 weeks ago and unsure if this is possible reaction. No acute distress noted at triage.

## 2015-12-14 ENCOUNTER — Ambulatory Visit (INDEPENDENT_AMBULATORY_CARE_PROVIDER_SITE_OTHER): Payer: PPO | Admitting: Pharmacist

## 2015-12-14 DIAGNOSIS — Z7901 Long term (current) use of anticoagulants: Secondary | ICD-10-CM | POA: Diagnosis not present

## 2015-12-14 DIAGNOSIS — I4891 Unspecified atrial fibrillation: Secondary | ICD-10-CM | POA: Diagnosis not present

## 2015-12-14 LAB — POCT INR: INR: 3.1

## 2015-12-17 DIAGNOSIS — R197 Diarrhea, unspecified: Secondary | ICD-10-CM | POA: Diagnosis not present

## 2015-12-18 ENCOUNTER — Other Ambulatory Visit: Payer: Self-pay | Admitting: Physician Assistant

## 2015-12-18 NOTE — Telephone Encounter (Signed)
Rx(s) sent to pharmacy electronically.  

## 2015-12-18 NOTE — Telephone Encounter (Signed)
Review for refill, Thank you. 

## 2015-12-21 ENCOUNTER — Other Ambulatory Visit: Payer: Self-pay | Admitting: Orthopedic Surgery

## 2015-12-21 DIAGNOSIS — M533 Sacrococcygeal disorders, not elsewhere classified: Secondary | ICD-10-CM

## 2015-12-24 ENCOUNTER — Telehealth: Payer: Self-pay | Admitting: Gastroenterology

## 2015-12-25 LAB — CUP PACEART REMOTE DEVICE CHECK
Battery Remaining Longevity: 70 mo
Brady Statistic AP VS Percent: 93 %
Brady Statistic AS VP Percent: 1 %
Implantable Lead Implant Date: 20110330
Implantable Lead Location: 753859
Lead Channel Impedance Value: 350 Ohm
Lead Channel Pacing Threshold Amplitude: 0.5 V
Lead Channel Pacing Threshold Pulse Width: 0.4 ms
Lead Channel Pacing Threshold Pulse Width: 0.4 ms
Lead Channel Sensing Intrinsic Amplitude: 9.6 mV
Lead Channel Setting Pacing Amplitude: 2.5 V
Lead Channel Setting Pacing Pulse Width: 0.4 ms
MDC IDC LEAD IMPLANT DT: 20110330
MDC IDC LEAD LOCATION: 753860
MDC IDC MSMT BATTERY REMAINING PERCENTAGE: 65 %
MDC IDC MSMT BATTERY VOLTAGE: 2.9 V
MDC IDC MSMT LEADCHNL RA PACING THRESHOLD AMPLITUDE: 0.75 V
MDC IDC MSMT LEADCHNL RA SENSING INTR AMPL: 2.3 mV
MDC IDC MSMT LEADCHNL RV IMPEDANCE VALUE: 440 Ohm
MDC IDC SESS DTM: 20170804065537
MDC IDC SET LEADCHNL RA PACING AMPLITUDE: 2 V
MDC IDC SET LEADCHNL RV SENSING SENSITIVITY: 2 mV
MDC IDC STAT BRADY AP VP PERCENT: 4.6 %
MDC IDC STAT BRADY AS VS PERCENT: 1.6 %
MDC IDC STAT BRADY RA PERCENT PACED: 88 %
MDC IDC STAT BRADY RV PERCENT PACED: 5.4 %
Pulse Gen Model: 2210
Pulse Gen Serial Number: 7124006

## 2015-12-25 NOTE — Telephone Encounter (Signed)
Patient c/o diarrhea for a few weeks.  He will come in and see Dr. Fuller Plan on 01/01/16 3:00

## 2015-12-25 NOTE — Telephone Encounter (Signed)
Left message for patient to call back  

## 2016-01-01 ENCOUNTER — Encounter: Payer: Self-pay | Admitting: Gastroenterology

## 2016-01-01 ENCOUNTER — Ambulatory Visit (INDEPENDENT_AMBULATORY_CARE_PROVIDER_SITE_OTHER): Payer: PPO | Admitting: Gastroenterology

## 2016-01-01 VITALS — BP 102/56 | HR 76 | Ht 67.5 in | Wt 167.4 lb

## 2016-01-01 DIAGNOSIS — K219 Gastro-esophageal reflux disease without esophagitis: Secondary | ICD-10-CM

## 2016-01-01 DIAGNOSIS — R197 Diarrhea, unspecified: Secondary | ICD-10-CM | POA: Diagnosis not present

## 2016-01-01 DIAGNOSIS — K2270 Barrett's esophagus without dysplasia: Secondary | ICD-10-CM | POA: Diagnosis not present

## 2016-01-01 NOTE — Progress Notes (Signed)
    History of Present Illness: This is a 82 year old male referred by Deland Pretty, MD for the evaluation of 2 episodes of diarrhea in August. He is unaccompanied. Each episode of diarrhea last about 3 or 4 days and then completely resolved. Bowel habits have returned to normal. He drinks at least 3 glasses of milk per day and usually drinks 1 glass of prune juice to help with chronic constipation. He was evaluated by Dr. Shelia Media and a GI pathogen panel was performed on August 21 which was normal. The patient also states he had blood work performed that I have not received those records. Colonoscopy 02/2009 showed mild diverticulosis and internal hemorrhoids. Denies weight loss, abdominal pain, change in stool caliber, melena, hematochezia, nausea, vomiting, dysphagia, reflux symptoms, chest pain.   Review of Systems: Pertinent positive and negative review of systems were noted in the above HPI section. All other review of systems were otherwise negative.  Current Medications, Allergies, Past Medical History, Past Surgical History, Family History and Social History were reviewed in [employer] record.  Physical Exam: General: Well developed, well nourished, no acute distress Head: Normocephalic and atraumatic Eyes:  sclerae anicteric, EOMI Ears: Normal auditory acuity Mouth: No deformity or lesions Neck: Supple, no masses or thyromegaly Lungs: Clear throughout to auscultation Heart: Regular rate and rhythm; no murmurs, rubs or bruits Abdomen: Soft, non tender and non distended. No masses, hepatosplenomegaly or hernias noted. Normal Bowel sounds Musculoskeletal: Symmetrical with no gross deformities  Skin: No lesions on visible extremities Pulses:  Normal pulses noted Extremities: No clubbing, cyanosis, edema or deformities noted Neurological: Alert oriented x 4, grossly nonfocal Cervical Nodes:  No significant cervical adenopathy Inguinal Nodes: No significant inguinal  adenopathy Psychological:  Alert and cooperative. Normal mood and affect  Assessment and Recommendations:  1. Self limited diarrhea. GI pathogen panel was negative. Call us if these symptoms return or other GI symptoms develop. Avoid milk products, raw fruits, raw vegetables and prune juice during diarrheal illnesses.  2.  Short segment Barrett's no longer in surveillance program due to age. Continue Nexium daily and standard antireflux measures.   3. Chronic constipation. Maintain daily prune juice as needed.   cc: Deland Pretty, MD [address] [place], [place] [address]

## 2016-01-01 NOTE — Patient Instructions (Signed)
Call our office back if you continue to have diarrhea.  Should you start having diarrhea again, make sure to go lactose free (dairy).  If you are age 80 or older, your body mass index should be between 23-30. Your Body mass index is 25.83 kg/m. If this is out of the aforementioned range listed, please consider follow up with your Primary Care Provider.  If you are age 20 or younger, your body mass index should be between 19-25. Your Body mass index is 25.83 kg/m. If this is out of the aformentioned range listed, please consider follow up with your Primary Care Provider.

## 2016-01-04 ENCOUNTER — Ambulatory Visit (INDEPENDENT_AMBULATORY_CARE_PROVIDER_SITE_OTHER): Payer: PPO | Admitting: Pharmacist Clinician (PhC)/ Clinical Pharmacy Specialist

## 2016-01-04 ENCOUNTER — Ambulatory Visit
Admission: RE | Admit: 2016-01-04 | Discharge: 2016-01-04 | Disposition: A | Discharge status: Home / Self Care | Payer: PPO | Source: Ambulatory Visit | Attending: Orthopedic Surgery | Admitting: Orthopedic Surgery

## 2016-01-04 DIAGNOSIS — M533 Sacrococcygeal disorders, not elsewhere classified: Secondary | ICD-10-CM

## 2016-01-04 DIAGNOSIS — I4891 Unspecified atrial fibrillation: Secondary | ICD-10-CM | POA: Diagnosis not present

## 2016-01-04 DIAGNOSIS — M461 Sacroiliitis, not elsewhere classified: Secondary | ICD-10-CM | POA: Diagnosis not present

## 2016-01-04 DIAGNOSIS — Z7901 Long term (current) use of anticoagulants: Secondary | ICD-10-CM

## 2016-01-04 LAB — POCT INR: INR: 1.2

## 2016-01-04 MED ORDER — METHYLPREDNISOLONE ACETATE 40 MG/ML INJ SUSP (RADIOLOG: 120.0000 mg | Freq: Once | INTRAMUSCULAR | Status: DC

## 2016-01-09 ENCOUNTER — Other Ambulatory Visit: Payer: Self-pay | Admitting: Physician Assistant

## 2016-01-09 NOTE — Telephone Encounter (Signed)
Please review for refill. Thanks!  

## 2016-01-10 ENCOUNTER — Other Ambulatory Visit: Payer: Self-pay

## 2016-01-10 DIAGNOSIS — M1711 Unilateral primary osteoarthritis, right knee: Secondary | ICD-10-CM | POA: Diagnosis not present

## 2016-01-10 DIAGNOSIS — M1712 Unilateral primary osteoarthritis, left knee: Secondary | ICD-10-CM | POA: Diagnosis not present

## 2016-01-10 DIAGNOSIS — M25561 Pain in right knee: Secondary | ICD-10-CM | POA: Diagnosis not present

## 2016-01-10 DIAGNOSIS — M25562 Pain in left knee: Secondary | ICD-10-CM | POA: Diagnosis not present

## 2016-01-10 MED ORDER — FUROSEMIDE 20 MG PO TABS: 20.0000 mg | ORAL_TABLET | Freq: Every day | ORAL | 6 refills | Status: DC

## 2016-01-17 DIAGNOSIS — H26491 Other secondary cataract, right eye: Secondary | ICD-10-CM | POA: Diagnosis not present

## 2016-01-18 ENCOUNTER — Ambulatory Visit (INDEPENDENT_AMBULATORY_CARE_PROVIDER_SITE_OTHER): Payer: PPO | Admitting: Pharmacist

## 2016-01-18 DIAGNOSIS — Z7901 Long term (current) use of anticoagulants: Secondary | ICD-10-CM | POA: Diagnosis not present

## 2016-01-18 DIAGNOSIS — I4891 Unspecified atrial fibrillation: Secondary | ICD-10-CM

## 2016-01-18 LAB — POCT INR: INR: 2.2

## 2016-01-28 ENCOUNTER — Telehealth: Payer: Self-pay | Admitting: Cardiovascular Disease

## 2016-01-28 NOTE — Telephone Encounter (Signed)
Spoke w/ pt and informed him that it is ok if he sends a remote transmission. He believes he was in Afib over the weekend but doesn't believes he is is in it now. Informed pt that once the transmission is received a device tech will review it and call him back. Pt verbalized understanding.

## 2016-01-28 NOTE — Telephone Encounter (Signed)
New Message:     Pt says he thinks he is in atrial fib,he wants to transmit.

## 2016-01-29 NOTE — Telephone Encounter (Signed)
Returned patient call regarding his remote transmission. He verbalized noticing that he was in afib over the weekend with his only symptom being that he could feel his heart beating faster. He explained that he had to admit his wife into the hospital over the weekend. I reviewed his transmission with him, explaining that his AT/AF burden was consistent with previous measurements at this time. He denied any CP or increased ShOB. I explained that stressful situations can also contribute to AT/AF. I encouraged him to call back if he began to have symptoms but that we would continue to monitor at this time. He verbalized appreciation stating he felt comfortable with this plan.

## 2016-02-09 ENCOUNTER — Other Ambulatory Visit: Payer: Self-pay | Admitting: Physician Assistant

## 2016-02-11 NOTE — Telephone Encounter (Signed)
Please review for refill. Thanks!  

## 2016-02-15 ENCOUNTER — Ambulatory Visit (INDEPENDENT_AMBULATORY_CARE_PROVIDER_SITE_OTHER): Payer: PPO | Admitting: Pharmacist Clinician (PhC)/ Clinical Pharmacy Specialist

## 2016-02-15 DIAGNOSIS — Z7901 Long term (current) use of anticoagulants: Secondary | ICD-10-CM | POA: Diagnosis not present

## 2016-02-15 DIAGNOSIS — I4891 Unspecified atrial fibrillation: Secondary | ICD-10-CM | POA: Diagnosis not present

## 2016-02-15 LAB — POCT INR: INR: 2.9

## 2016-03-06 DIAGNOSIS — E785 Hyperlipidemia, unspecified: Secondary | ICD-10-CM | POA: Diagnosis not present

## 2016-03-06 DIAGNOSIS — Z0001 Encounter for general adult medical examination with abnormal findings: Secondary | ICD-10-CM | POA: Diagnosis not present

## 2016-03-06 DIAGNOSIS — Z13220 Encounter for screening for lipoid disorders: Secondary | ICD-10-CM | POA: Diagnosis not present

## 2016-03-07 DIAGNOSIS — Z0000 Encounter for general adult medical examination without abnormal findings: Secondary | ICD-10-CM | POA: Diagnosis not present

## 2016-03-07 DIAGNOSIS — J40 Bronchitis, not specified as acute or chronic: Secondary | ICD-10-CM | POA: Diagnosis not present

## 2016-03-07 DIAGNOSIS — F329 Major depressive disorder, single episode, unspecified: Secondary | ICD-10-CM | POA: Diagnosis not present

## 2016-03-12 DIAGNOSIS — E559 Vitamin D deficiency, unspecified: Secondary | ICD-10-CM | POA: Diagnosis not present

## 2016-03-12 DIAGNOSIS — J449 Chronic obstructive pulmonary disease, unspecified: Secondary | ICD-10-CM | POA: Diagnosis not present

## 2016-03-12 DIAGNOSIS — E785 Hyperlipidemia, unspecified: Secondary | ICD-10-CM | POA: Diagnosis not present

## 2016-03-12 DIAGNOSIS — I10 Essential (primary) hypertension: Secondary | ICD-10-CM | POA: Diagnosis not present

## 2016-03-12 DIAGNOSIS — Z23 Encounter for immunization: Secondary | ICD-10-CM | POA: Diagnosis not present

## 2016-03-13 ENCOUNTER — Other Ambulatory Visit (HOSPITAL_COMMUNITY): Payer: Self-pay | Admitting: Respiratory Therapy

## 2016-03-13 DIAGNOSIS — R0609 Other forms of dyspnea: Principal | ICD-10-CM

## 2016-03-25 ENCOUNTER — Ambulatory Visit (INDEPENDENT_AMBULATORY_CARE_PROVIDER_SITE_OTHER): Payer: PPO | Admitting: Pharmacist Clinician (PhC)/ Clinical Pharmacy Specialist

## 2016-03-25 ENCOUNTER — Encounter: Payer: Self-pay | Admitting: Cardiovascular Disease

## 2016-03-25 ENCOUNTER — Ambulatory Visit (INDEPENDENT_AMBULATORY_CARE_PROVIDER_SITE_OTHER): Payer: PPO | Admitting: Cardiovascular Disease

## 2016-03-25 VITALS — BP 130/62 | HR 60 | Ht 69.5 in | Wt 167.2 lb

## 2016-03-25 DIAGNOSIS — Z7901 Long term (current) use of anticoagulants: Secondary | ICD-10-CM

## 2016-03-25 DIAGNOSIS — I4891 Unspecified atrial fibrillation: Secondary | ICD-10-CM

## 2016-03-25 DIAGNOSIS — J449 Chronic obstructive pulmonary disease, unspecified: Secondary | ICD-10-CM

## 2016-03-25 DIAGNOSIS — D649 Anemia, unspecified: Secondary | ICD-10-CM | POA: Diagnosis not present

## 2016-03-25 DIAGNOSIS — Z5181 Encounter for therapeutic drug level monitoring: Secondary | ICD-10-CM

## 2016-03-25 DIAGNOSIS — I2510 Atherosclerotic heart disease of native coronary artery without angina pectoris: Secondary | ICD-10-CM

## 2016-03-25 DIAGNOSIS — I495 Sick sinus syndrome: Secondary | ICD-10-CM | POA: Insufficient documentation

## 2016-03-25 DIAGNOSIS — Z950 Presence of cardiac pacemaker: Secondary | ICD-10-CM

## 2016-03-25 DIAGNOSIS — Z79899 Other long term (current) drug therapy: Secondary | ICD-10-CM

## 2016-03-25 LAB — POCT INR: INR: 2.8

## 2016-03-25 NOTE — Progress Notes (Signed)
Patient ID: LEMONT DUEL, male   DOB: 1930/03/28, 82 y.o.   MRN: [id number]    Cardiology Office Note    Date:  03/25/2016   ID:  DARYUS LAPAN, DOB January 07, 1930, MRN [id number]  PCP:  Horatio Pel, MD  Cardiologist:   Sanda Klein, MD   Chief Complaint  Patient presents with  . Follow-up    very little chest pain-if any, has frequently has shortness of breath-with exertion, has alot of edmea, has pain in left leg, has some lightheaded and dizziness    History of Present Illness:  CRISTOVAL SHINES is a 82 y.o. male with a history of sinus node dysfunction and tachycardia bradycardia syndrome with recurrent paroxysmal atrial fibrillation, returning in follow-up . He has not had persistent atrial fibrillation since undergoing electrical cardioversion in April, though his pacemaker has recorded shorter episodes of paroxysmal atrial fibrillation. He also has coronary artery disease with previous stents (LAD and RCA 2006, but without any coronary revascularization in over 10 years). He has long-standing COPD and chronic shortness of breath, functional class II-III. Since his last appointment he has not had any cardiac complaints.  He is suffering from severe low back pain which has led to substantial reduction and quality of life. He has lost a lot of weight, is very inactive and is very preoccupied with his wife's health. She was hospitalized in September and has not returned home since. She is running out of her Medicare approved nursing home days. He looks quite pale, but denies any serious bleeding problems.  He denies angina pectoris, worsening dyspnea, palpitations, leg edema, syncope, focal neurological deficits, bleeding problems, claudication.  Interrogation of his pacemaker today shows a single significant episode of atrial fibrillation since his last pacemaker check. This occurred on September 30, the night that he took his wife to the emergency room and she was hospitalized.  at  onset the rhythm was clearly atrial fibrillation, and organized into an atrial flutter-like rhythm with an atrial rates of about 260 bpm and mostly 2-1 AV block, with periods of 1:1 conduction. Otherwise he has 89% atrial pacing and only 6% ventricular pacing. The overall burden of atrial fibrillation has been 2%. This may be an exaggeration since there is some "noise" on his St. Jude 2088 atrial lead with multiple very brief episodes of artifactual mode switch. The noise can be reproduced with manipulation of the shoulder. His dual-chamber St. Jude accent DR RF device, which was implanted in 2011 has an estimated longevity of about another 5 years.   Anticoagulation level is good today. He has normal left ventricular systolic function. In 2015 he underwent coronary angiography that showed widely patent Cypher stents in the LAD and RCA.    Past Medical History:  Diagnosis Date  . Arthritis   . Asthma   . Barrett's esophagus 03-19-2009   EGD  . CAD (coronary artery disease)   . Cataract    REMOVED  . COPD (chronic obstructive pulmonary disease) ([place])   . Diverticulosis 03-19-2009   Colonoscopy   . Erosive esophagitis 03-19-2009   EGD  . GERD (gastroesophageal reflux disease)   . Hiatal hernia 03-19-2009   EGD  . Hyperlipidemia   . Hypertension   . Internal hemorrhoids 03-19-2009   Colonoscopy  . Paroxysmal atrial fibrillation [facility])     Past Surgical History:  Procedure Laterality Date  . CARDIAC CATHETERIZATION  02/12/2009   mild ostial left main disease,patent LAD & RCA stents   . CARDIOVERSION N/A  11/06/2014   Procedure: CARDIOVERSION;  Surgeon: [name], MD;  Location: [facility] ENDOSCOPY;  Service: Cardiovascular;  Laterality: N/A;  . CARDIOVERSION N/A 08/02/2015   Procedure: CARDIOVERSION;  Surgeon: Sueanne Margarita, MD;  Location: [facility] ENDOSCOPY;  Service: Cardiovascular;  Laterality: N/A;  . CATARACT EXTRACTION    . COLONOSCOPY    . CORONARY STENT PLACEMENT  2006   Right  Coronary Artery Cypher stents placed 2006  . HEMORRHOID SURGERY    . LEFT HEART CATHETERIZATION WITH CORONARY ANGIOGRAM N/A 07/01/2013   Procedure: LEFT HEART CATHETERIZATION WITH CORONARY ANGIOGRAM;  Surgeon: Sanda Klein, MD;  Location: [place] CATH LAB;  Service: Cardiovascular;  Laterality: N/A;  . NM MYOCAR PERF WALL MOTION  08/14/2008   no significant ischemia EF 64%  . PACEMAKER INSERTION  07/25/2009   St.Jude Accent  . TEE WITHOUT CARDIOVERSION N/A 08/02/2015   Procedure: TRANSESOPHAGEAL ECHOCARDIOGRAM (TEE);  Surgeon: Sueanne Margarita, MD;  Location: [facility] ENDOSCOPY;  Service: Cardiovascular;  Laterality: N/A;  . TONSILLECTOMY    . US ECHOCARDIOGRAPHY  07/12/2009   borderline LA enlargement,mild mitral annular ca+, AOV mildly sclerotic,trace AI.  Marland Kitchen VASECTOMY      Current Medications: Outpatient Medications Prior to Visit  Medication Sig Dispense Refill  . albuterol (PROVENTIL HFA;VENTOLIN HFA) 108 (90 BASE) MCG/ACT inhaler Inhale 2 puffs into the lungs every 4 (four) hours as needed for wheezing or shortness of breath. Reported on 08/01/2015    . Ascorbic Acid (VITAMIN C) 1000 MG tablet Take 1,000 mg by mouth daily.    Marland Kitchen b complex vitamins capsule Take 1 capsule by mouth daily.    . budesonide-formoterol (SYMBICORT) 160-4.5 MCG/ACT inhaler Inhale 1-2 puffs into the lungs 2 (two) times daily. Inhale 2 puffs every morning and 1 puff at bedtime    . BYSTOLIC 5 MG tablet take 1 tablet by mouth once daily 90 tablet 1  . Cholecalciferol (VITAMIN D) 2000 UNITS tablet Take 2,000 Units by mouth daily.    . Esomeprazole Magnesium (NEXIUM PO) Take 44.6 mg by mouth daily.    . finasteride (PROSCAR) 5 MG tablet Take 5 mg by mouth at bedtime.     . furosemide (LASIX) 20 MG tablet Take 1 tablet (20 mg total) by mouth daily. 30 tablet 6  . HYDROcodone-acetaminophen (NORCO) 10-325 MG tablet Take 1 tablet by mouth every 6 (six) hours as needed. 30 tablet 0  . neomycin-bacitracin-polymyxin (NEOSPORIN) OINT Apply 1  application topically daily as needed for wound care. Reported on 08/31/2015    . nitroGLYCERIN (NITROSTAT) 0.4 MG SL tablet Place 0.4 mg under the tongue every 5 (five) minutes as needed for chest pain.     . Omega-3 Fatty Acids (FISH OIL) 1000 MG CAPS Take 2,000 mg by mouth daily.    . potassium chloride SA (K-DUR,KLOR-CON) 20 MEQ tablet take 1 tablet by mouth once daily 30 tablet 6  . rosuvastatin (CRESTOR) 5 MG tablet Take 5 mg by mouth 3 (three) times a week. Reported on 08/31/2015    . senna-docusate (SENNA-S) 8.6-50 MG per tablet Take 1 tablet by mouth at bedtime.    . sotalol (BETAPACE) 80 MG tablet take 1 tablet by mouth every 12 hours 60 tablet 5  . tamsulosin (FLOMAX) 0.4 MG CAPS capsule Take 1 capsule by mouth daily.    . temazepam (RESTORIL) 30 MG capsule Take 30 mg by mouth at bedtime.    Marland Kitchen warfarin (COUMADIN) 5 MG tablet take 1 tablet by mouth once daily OR AS DIRECTED  BY COUMADIN CLINIC 90 tablet 1  . vitamin E 400 UNIT capsule Take 400 Units by mouth daily.      No facility-administered medications prior to visit.      Allergies:   Amiodarone; Zocor [simvastatin]; and Penicillins   Social History   Social History  . Marital status: Married    Spouse name: N/A  . Number of children: N/A  . Years of education: N/A   Occupational History  . Retired     Social History Main Topics  . Smoking status: Former Research scientist (life sciences)  . Smokeless tobacco: Never Used     Comment: Quit smoking 1996  . Alcohol use No  . Drug use: No  . Sexual activity: Not Asked   Other Topics Concern  . None   Social History Narrative   Daily caffeine      Family History:  The patient's family history includes Heart disease in his father and mother.   ROS:   Please see the history of present illness.    ROS All other systems reviewed and are negative.   PHYSICAL EXAM:   VS:  BP 130/62 (BP Location: Right Arm, Patient Position: Sitting, Cuff Size: Normal)   Pulse 60   Ht 5' 9.5" (1.765 m)   Wt  167 lb 4 oz (75.9 kg)   BMI 24.34 kg/m    GEN: Well nourished, well developed, in no acute distress  HEENT: normal except for pallor Neck: no JVD, carotid bruits, or masses Cardiac: RRR; no murmurs, rubs, or gallops,no edema  Respiratory:  clear to auscultation bilaterally, normal work of breathing GI: soft, nontender, nondistended, + BS MS: no deformity or atrophy  Skin: warm and dry, no rash Neuro:  Alert and Oriented x 3, Strength and sensation are intact Psych: euthymic mood, full affect  Wt Readings from Last 3 Encounters:  03/25/16 167 lb 4 oz (75.9 kg)  01/01/16 167 lb 6 oz (75.9 kg)  11/21/15 160 lb 9.6 oz (72.8 kg)      Studies/Labs Reviewed:   EKG:  EKG is ordered today and demonstrates atrial paced, ventricular sensed rhythm. Incomplete right bundle branch block. A single PVC is seen. QTC 453 ms  Recent Labs: 08/01/2015: TSH 0.609 08/04/2015: Magnesium 2.1 12/13/2015: ALT 15; BUN 9; Creatinine, Ser 0.63; Hemoglobin 11.7; Platelets 384; Potassium 4.0; Sodium 133   Labs November 9 show glucose 94, creatinine 0.5, A1c 5.5%, TSH 2.6. He reports that CBC was also performed, but I cannot immediately retrieve those results. Total cholesterol 110, HDL 43, LDL 60, triglycerides 34  ASSESSMENT:    1. SSS (sick sinus syndrome) ([place])   2. Atrial fibrillation with rapid ventricular response ([place])   3. Encounter for monitoring sotalol therapy   4. Coronary artery disease involving native coronary artery of native heart without angina pectoris   5. Long term current use of anticoagulant therapy   6. S/P placement of cardiac pacemaker   7. Chronic obstructive pulmonary disease, unspecified COPD type ([place])   8. Anemia, unspecified type      PLAN:  In order of problems listed above:   1. SSS: Current pacemaker settings including sensor settings appear to be appropriate. He does not have symptoms of chronotropic incompetence. 2. AFib: Low burden of paroxysmal atrial flutter  that was self terminated in the last month. Continue warfarin anticoagulation, INR today 2.8. I think his warfarin can be temporarily interrupted with relatively low risk, if he decides to have lumbar spine surgery. CHADSVasc 4 (age 57,  HTN, CAD).  3. Sotalol: QTC 453 ms, in appropriate range. Recent labs performed in Dr. Pennie Banter office showed normal creatinine and potassium level. Continue sotalol with periodic review of QT interval, renal function and electrolytes. 4. CAD: Asymptomatic. After receiving his stents in 2006 has not had any clinical events. His coronary angiogram in 2015 showed the stents to be widely patent. He is asymptomatic 5. Warfarin: Will check the recently performed CBC. INR in therapeutic range today. 6. PPM: Normal device function, remote download every 3 months and office visit in 6 months 7. COPD: This appears to be at baseline. He is not wheezing today. 8. Anemia: He looks pale, will ask for the results of labs performed a week ago in PCPs office.      Medication Adjustments/Labs and Tests Ordered: Current medicines are reviewed at length with the patient today.  Concerns regarding medicines are outlined above.  Medication changes, Labs and Tests ordered today are listed in the Patient Instructions below. Patient Instructions  Dr Sallyanne Kuster recommends that you continue on your current medications as directed. Please refer to the Current Medication list given to you today.  We will request labs from Dr Shelia Media. I will call you if you need additional labs.   Remote monitoring is used to monitor your Pacemaker of ICD from home. This monitoring reduces the number of office visits required to check your device to one time per year. It allows [place] to keep an eye on the functioning of your device to ensure it is working properly. You are scheduled for a device check from home on Tuesday, May 29th, 2018. You may send your transmission at any time that day. If you have a wireless  device, the transmission will be sent automatically. After your physician reviews your transmission, you will receive a postcard with your next transmission date.  Dr Sallyanne Kuster recommends that you schedule a follow-up appointment in 12 months with a pacemaker check. You will receive a reminder letter in the mail two months in advance. If you don't receive a letter, please call our office to schedule the follow-up appointment.  If you need a refill on your cardiac medications before your next appointment, please call your pharmacy.    Signed, Sanda Klein, MD  03/25/2016 2:03 PM    [address], [place], [place]  [address] Phone: [phone]; Fax: [phone]

## 2016-03-25 NOTE — Progress Notes (Signed)
  ROS:   Please see the history of present illness.    Review of Systems  Skin: Positive for nail changes.   All other systems reviewed and are negative.

## 2016-03-25 NOTE — Patient Instructions (Signed)
Dr Sallyanne Kuster recommends that you continue on your current medications as directed. Please refer to the Current Medication list given to you today.  We will request labs from Dr Shelia Media. I will call you if you need additional labs.   Remote monitoring is used to monitor your Pacemaker of ICD from home. This monitoring reduces the number of office visits required to check your device to one time per year. It allows [place] to keep an eye on the functioning of your device to ensure it is working properly. You are scheduled for a device check from home on Tuesday, May 29th, 2018. You may send your transmission at any time that day. If you have a wireless device, the transmission will be sent automatically. After your physician reviews your transmission, you will receive a postcard with your next transmission date.  Dr Sallyanne Kuster recommends that you schedule a follow-up appointment in 12 months with a pacemaker check. You will receive a reminder letter in the mail two months in advance. If you don't receive a letter, please call our office to schedule the follow-up appointment.  If you need a refill on your cardiac medications before your next appointment, please call your pharmacy.

## 2016-03-31 DIAGNOSIS — F5101 Primary insomnia: Secondary | ICD-10-CM | POA: Diagnosis not present

## 2016-03-31 DIAGNOSIS — Z8679 Personal history of other diseases of the circulatory system: Secondary | ICD-10-CM | POA: Diagnosis not present

## 2016-03-31 DIAGNOSIS — F329 Major depressive disorder, single episode, unspecified: Secondary | ICD-10-CM | POA: Diagnosis not present

## 2016-03-31 DIAGNOSIS — F439 Reaction to severe stress, unspecified: Secondary | ICD-10-CM | POA: Diagnosis not present

## 2016-04-04 LAB — CUP PACEART INCLINIC DEVICE CHECK
Battery Remaining Longevity: 60 mo
Battery Remaining Percentage: 57 %
Brady Statistic RA Percent Paced: 89 %
Brady Statistic RV Percent Paced: 6 %
Date Time Interrogation Session: 20171208094610
Implantable Lead Implant Date: 20110330
Lead Channel Pacing Threshold Amplitude: 0.5 V
Lead Channel Pacing Threshold Pulse Width: 0.4 ms
Lead Channel Sensing Intrinsic Amplitude: 2 mV
Lead Channel Sensing Intrinsic Amplitude: 9.7 mV
Lead Channel Setting Pacing Amplitude: 2.5 V
Lead Channel Setting Pacing Pulse Width: 0.4 ms
Lead Channel Setting Sensing Sensitivity: 2 mV
MDC IDC LEAD IMPLANT DT: 20110330
MDC IDC LEAD LOCATION: 753859
MDC IDC LEAD LOCATION: 753860
MDC IDC MSMT BATTERY VOLTAGE: 2.89 V
MDC IDC MSMT LEADCHNL RA IMPEDANCE VALUE: 310 Ohm
MDC IDC MSMT LEADCHNL RV IMPEDANCE VALUE: 410 Ohm
MDC IDC MSMT LEADCHNL RV PACING THRESHOLD AMPLITUDE: 0.75 V
MDC IDC MSMT LEADCHNL RV PACING THRESHOLD PULSEWIDTH: 0.4 ms
MDC IDC PG IMPLANT DT: 20110330
MDC IDC PG SERIAL: 7124006
MDC IDC SET LEADCHNL RA PACING AMPLITUDE: 2 V

## 2016-04-11 DIAGNOSIS — M533 Sacrococcygeal disorders, not elsewhere classified: Secondary | ICD-10-CM | POA: Diagnosis not present

## 2016-04-11 DIAGNOSIS — B356 Tinea cruris: Secondary | ICD-10-CM | POA: Diagnosis not present

## 2016-04-23 ENCOUNTER — Other Ambulatory Visit (HOSPITAL_COMMUNITY): Payer: Self-pay | Admitting: Internal Medicine

## 2016-04-23 DIAGNOSIS — R454 Irritability and anger: Secondary | ICD-10-CM | POA: Diagnosis not present

## 2016-04-23 DIAGNOSIS — W19XXXA Unspecified fall, initial encounter: Secondary | ICD-10-CM | POA: Diagnosis not present

## 2016-04-23 DIAGNOSIS — R300 Dysuria: Secondary | ICD-10-CM | POA: Diagnosis not present

## 2016-04-23 DIAGNOSIS — R410 Disorientation, unspecified: Secondary | ICD-10-CM

## 2016-04-30 ENCOUNTER — Encounter: Payer: Self-pay | Admitting: Neurology

## 2016-04-30 ENCOUNTER — Ambulatory Visit (INDEPENDENT_AMBULATORY_CARE_PROVIDER_SITE_OTHER): Payer: PPO | Admitting: Neurology

## 2016-04-30 VITALS — BP 95/60 | HR 84 | Ht 69.5 in | Wt 155.8 lb

## 2016-04-30 DIAGNOSIS — R410 Disorientation, unspecified: Secondary | ICD-10-CM

## 2016-04-30 DIAGNOSIS — F05 Delirium due to known physiological condition: Secondary | ICD-10-CM | POA: Diagnosis not present

## 2016-04-30 DIAGNOSIS — F329 Major depressive disorder, single episode, unspecified: Secondary | ICD-10-CM | POA: Diagnosis not present

## 2016-04-30 DIAGNOSIS — B356 Tinea cruris: Secondary | ICD-10-CM | POA: Diagnosis not present

## 2016-04-30 MED ORDER — DIVALPROEX SODIUM ER 500 MG PO TB24: 500.0000 mg | ORAL_TABLET | Freq: Every day | ORAL | 1 refills | Status: DC

## 2016-04-30 NOTE — Patient Instructions (Signed)
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 I recommend evaluation for reversible causes by checking dementia panel labs, EEG and CT scan. Trial of Depakote ER 500 mg daily for depression. I also encouraged the patient to increase participation in cognitively challenging activities like playing bridge, solving crossword puzzles and sudoku Patient will return for follow-up in 6 weeks or call earlier if necessary. We also discussed memory compensation strategies.  Memory Compensation Strategies  1. Use "WARM" strategy.  W= write it down  A= associate it  R= repeat it  M= make a mental note  2.   You can keep a Social worker.  Use a 3-ring notebook with sections for the following: calendar, important names and phone numbers,  medications, doctors' names/phone numbers, lists/reminders, and a section to journal what you did  each day.   3.    Use a calendar to write appointments down.  4.    Write yourself a schedule for the day.  This can be placed on the calendar or in a separate section of the Memory Notebook.  Keeping a  regular schedule can help memory.  5.    Use medication organizer with sections for each day or morning/evening pills.  You may need help loading it  6.    Keep a basket, or pegboard by the door.  Place items that you need to take out with you in the basket or on the pegboard.  You may also want to  include a message board for reminders.  7.    Use sticky notes.  Place sticky notes with reminders in a place where the task is performed.  For example: " turn off the  stove" placed by the stove, "lock the door" placed on the door at eye level, " take your medications" on  the bathroom mirror or by the place where you normally take your medications.  8.    Use alarms/timers.  Use while cooking  to remind yourself to check on food or as a reminder to take your medicine, or as a  reminder to make a call, or as a reminder to perform another task, etc.

## 2016-04-30 NOTE — Progress Notes (Signed)
[facility] [address] [place]. [place] [address] [phone]       OFFICE CONSULT NOTE  Mr. AVROHAM TIMMERS Date of Birth:  12/21/1929 Medical Record Number:  [id number]   Referring MD:  Deland Pretty Reason for Referral:   confusion HPI:  59 year Caucasian male who is accompanied today by his son who provides most of the history. Patient apparently  has been having some confusion, mood irritability and cognitive decline since last 1 month. Patient has also not been eating well and has lost some weight and has not been able to handle his affairs like paying bills. Patient has been under significant stress due to the health of his wife who is now in a nursing home. Patient has been on significant financial stress as well. Patient admits to being depressed but has not been started on any medications. He denies significant headaches, vision loss focal extremity weakness or numbness. He admits to being off balance and a few occasions but hasn't had no major falls or injuries. He has not had any workup for treatable causes of cognitive impairment. He does have chronic atrial fibrillation as well as pacemaker. He is had no recent brain imaging done. He has a history of TIA 5 years ago. Denies any recent fall, head injury or loss of consciousness. No history of seizures. Patient can still mostly manage his own affairs but the son has noticed some concern in recent times about his ability to continue to do so in the future  ROS:   14 system review of systems is positive for weight loss, fatigue, swelling in the legs, shortness of breath, snoring, constipation, easy bruising and bleeding, memory loss, confusion, anxiety, depression, not enough sleep, decreased energy, disinterest in activities, insomnia, sleepiness, snoring, restless legs and all other systems negative  PMH:  Past Medical History:  Diagnosis Date  . Arthritis   . Asthma   . Barrett's esophagus 03-19-2009   EGD  .  CAD (coronary artery disease)   . Cataract    REMOVED  . COPD (chronic obstructive pulmonary disease) ([place])   . Diverticulosis 03-19-2009   Colonoscopy   . Erosive esophagitis 03-19-2009   EGD  . GERD (gastroesophageal reflux disease)   . Hiatal hernia 03-19-2009   EGD  . Hyperlipidemia   . Hypertension   . Internal hemorrhoids 03-19-2009   Colonoscopy  . Paroxysmal atrial fibrillation ([place])     Social History:  Social History   Social History  . Marital status: Married    Spouse name: N/A  . Number of children: N/A  . Years of education: N/A   Occupational History  . Retired     Social History Main Topics  . Smoking status: Former Research scientist (life sciences)  . Smokeless tobacco: Never Used     Comment: Quit smoking 1996  . Alcohol use No  . Drug use: No  . Sexual activity: Not on file   Other Topics Concern  . Not on file   Social History Narrative   Daily caffeine     Medications:   Current Outpatient Prescriptions on File Prior to Visit  Medication Sig Dispense Refill  . albuterol (PROVENTIL HFA;VENTOLIN HFA) 108 (90 BASE) MCG/ACT inhaler Inhale 2 puffs into the lungs every 4 (four) hours as needed for wheezing or shortness of breath. Reported on 08/01/2015    . Ascorbic Acid (VITAMIN C) 1000 MG tablet Take 1,000 mg by mouth daily.    Marland Kitchen b complex vitamins capsule Take 2  capsules by mouth daily.     . budesonide-formoterol (SYMBICORT) 160-4.5 MCG/ACT inhaler Inhale 1-2 puffs into the lungs 2 (two) times daily. Inhale 2 puffs every morning and 1 puff at bedtime    . BYSTOLIC 5 MG tablet take 1 tablet by mouth once daily 90 tablet 1  . Cholecalciferol (VITAMIN D) 2000 UNITS tablet Take 2,000 Units by mouth daily.    . Esomeprazole Magnesium (NEXIUM PO) Take 44.6 mg by mouth daily.    . finasteride (PROSCAR) 5 MG tablet Take 5 mg by mouth at bedtime.     . furosemide (LASIX) 20 MG tablet Take 1 tablet (20 mg total) by mouth daily. 30 tablet 6  . HYDROcodone-acetaminophen (NORCO)  10-325 MG tablet Take 1 tablet by mouth every 6 (six) hours as needed. 30 tablet 0  . neomycin-bacitracin-polymyxin (NEOSPORIN) OINT Apply 1 application topically daily as needed for wound care. Reported on 08/31/2015    . nitroGLYCERIN (NITROSTAT) 0.4 MG SL tablet Place 0.4 mg under the tongue every 5 (five) minutes as needed for chest pain.     . Omega-3 Fatty Acids (FISH OIL) 1000 MG CAPS Take 2,000 mg by mouth daily.    . potassium chloride SA (K-DUR,KLOR-CON) 20 MEQ tablet take 1 tablet by mouth once daily 30 tablet 6  . rosuvastatin (CRESTOR) 5 MG tablet Take 5 mg by mouth 3 (three) times a week. Reported on 08/31/2015    . senna-docusate (SENNA-S) 8.6-50 MG per tablet Take 1 tablet by mouth at bedtime.    . sertraline (ZOLOFT) 50 MG tablet Take 1 tablet by mouth daily.    . sotalol (BETAPACE) 80 MG tablet take 1 tablet by mouth every 12 hours 60 tablet 5  . tamsulosin (FLOMAX) 0.4 MG CAPS capsule Take 1 capsule by mouth daily.    . temazepam (RESTORIL) 30 MG capsule Take 30 mg by mouth at bedtime.    Marland Kitchen warfarin (COUMADIN) 5 MG tablet take 1 tablet by mouth once daily OR AS DIRECTED BY COUMADIN CLINIC 90 tablet 1   No current facility-administered medications on file prior to visit.     Allergies:   Allergies  Allergen Reactions  . Amiodarone Nausea And Vomiting  . Zocor [Simvastatin] Other (See Comments)    Unknown reaction  . Penicillins Hives, Swelling and Rash    Physical Exam General: well developed, well nourished elderly Caucasian male, seated, in no evident distress Head: head normocephalic and atraumatic.   Neck: supple with no carotid or supraclavicular bruits Cardiovascular: regular rate and rhythm, no murmurs Musculoskeletal: no deformity Skin:  no rash/petichiae Vascular:  Normal pulses all extremities  Neurologic Exam Mental Status: Awake and fully alert. Oriented to place and time. Recent and remote memory intact. Attention span, concentration and fund of knowledge  appropriate. Mood and affect appropriate. Mini-Mental status exam scored 29/30 with only one deficit in attention. Able to name 11 animals with four legs. Clock drawing 3/4. Geriatric depression scale 10 suggestive of moderate depression. Cranial Nerves: Fundoscopic exam reveals sharp disc margins. Pupils equal, briskly reactive to light. Extraocular movements full without nystagmus. Visual fields full to confrontation. Hearing intact. Facial sensation intact. Face, tongue, palate moves normally and symmetrically.  Motor: Normal bulk and tone. Normal strength in all tested extremity muscles. Sensory.: intact to touch , pinprick , position and vibratory sensation.  Coordination: Rapid alternating movements normal in all extremities. Finger-to-nose and heel-to-shin performed accurately bilaterally. Gait and Station: Arises from chair without difficulty. Stance is normal. Gait demonstrates  normal stride length and balance slightly off balance while standing on either foot unsupported. Not able to heel, toe and tandem walk without difficulty.  Reflexes: 1+ and symmetric. Toes downgoing.      ASSESSMENT: 69 year Caucasian male with subacute confusion and mood changes likely due to combination of age-related mild cognitive impairment and underlying anxiety depression. Remote history of TIA 5 years ago.    PLAN: I had a long discussion with the patient and his son regarding his subacute confusional state and cognitive difficulties which likely represent a combination of mild age related cognitive impairment with superimposed depression. I recommend evaluation for reversible causes by checking dementia panel labs, EEG and CT scan. Trial of Depakote ER 500 mg daily for depression. I also encouraged the patient to increase participation in cognitively challenging activities like playing bridge, solving crossword puzzles and sudoku Patient will return for follow-up in 6 weeks or call earlier if necessary. We  also discussed memory compensation strategies. Continue warfarin for stroke prevention given history of atrial fibrillation. Strict control of hypertension with blood pressure goal below 130/90 and lipids with LDL cholesterol goal below 70 mg percent .Greater than 50% time during this 45 minute consultation visit was spent on counseling and coordination of care about anxiety depression and cognitive impairment Antony Contras, MD  [facility] [address] [place], [place] [address]  Phone [phone] Fax [phone] Note: This document was prepared with digital dictation and possible smart phrase technology. Any transcriptional errors that result from this process are unintentional.

## 2016-05-01 ENCOUNTER — Other Ambulatory Visit: Payer: Self-pay | Admitting: Internal Medicine

## 2016-05-01 LAB — DEMENTIA PANEL
HOMOCYSTEINE: 6.4 umol/L (ref 0.0–15.0)
RPR Ser Ql: NONREACTIVE
TSH: 2.62 u[IU]/mL (ref 0.450–4.500)
VITAMIN B 12: 416 pg/mL (ref 232–1245)

## 2016-05-02 ENCOUNTER — Telehealth: Payer: Self-pay

## 2016-05-02 NOTE — Telephone Encounter (Signed)
-----   Message from Garvin Fila, MD sent at 05/02/2016 11:24 AM EST ----- Kindly call the patient and let him know that vitamin B12, TSH and test for syphilis were all normal

## 2016-05-02 NOTE — Telephone Encounter (Signed)
Rn spoke with pts son Ott Esse on [place] form. Rn stated the dementia panels labs for b12, tsh, syphilis and homocysteine came back normal. Pts son verbalized understanding.

## 2016-05-05 ENCOUNTER — Other Ambulatory Visit: Payer: Self-pay | Admitting: Neurology

## 2016-05-06 ENCOUNTER — Ambulatory Visit
Admission: RE | Admit: 2016-05-06 | Discharge: 2016-05-06 | Disposition: A | Discharge status: Home / Self Care | Payer: PPO | Source: Ambulatory Visit | Attending: Neurology | Admitting: Neurology

## 2016-05-06 ENCOUNTER — Ambulatory Visit (INDEPENDENT_AMBULATORY_CARE_PROVIDER_SITE_OTHER): Payer: PPO | Admitting: Pharmacist Clinician (PhC)/ Clinical Pharmacy Specialist

## 2016-05-06 DIAGNOSIS — M533 Sacrococcygeal disorders, not elsewhere classified: Secondary | ICD-10-CM | POA: Diagnosis not present

## 2016-05-06 DIAGNOSIS — I4891 Unspecified atrial fibrillation: Secondary | ICD-10-CM

## 2016-05-06 DIAGNOSIS — Z7901 Long term (current) use of anticoagulants: Secondary | ICD-10-CM

## 2016-05-06 DIAGNOSIS — R410 Disorientation, unspecified: Secondary | ICD-10-CM | POA: Diagnosis not present

## 2016-05-06 DIAGNOSIS — R55 Syncope and collapse: Secondary | ICD-10-CM | POA: Diagnosis not present

## 2016-05-06 DIAGNOSIS — F05 Delirium due to known physiological condition: Principal | ICD-10-CM

## 2016-05-06 LAB — POCT INR: INR: 1.3

## 2016-05-06 MED ORDER — IOPAMIDOL (ISOVUE-300) INJECTION 61%
75.0000 mL | Freq: Once | INTRAVENOUS | Status: AC | PRN
  Administered 2016-05-06: 75 mL via INTRAVENOUS

## 2016-05-08 ENCOUNTER — Telehealth: Payer: Self-pay

## 2016-05-08 NOTE — Telephone Encounter (Signed)
Rn call patients son about the CT scan results. Rn stated the brain shows mild changes age related hardening of the tiny blood vessels with any significant changes. Pts son verbalized understanding.

## 2016-05-08 NOTE — Telephone Encounter (Signed)
-----   Message from Garvin Fila, MD sent at 05/07/2016  2:02 PM EST ----- Kindly inform the patient that CT scan of the brain showed only mild changes of age-related hardening of the tiny blood vessels without any worrisome for significant findings

## 2016-05-13 ENCOUNTER — Ambulatory Visit: Payer: PPO | Admitting: Neurology

## 2016-05-26 ENCOUNTER — Ambulatory Visit (INDEPENDENT_AMBULATORY_CARE_PROVIDER_SITE_OTHER): Payer: PPO | Admitting: Pharmacist Clinician (PhC)/ Clinical Pharmacy Specialist

## 2016-05-26 DIAGNOSIS — Z7901 Long term (current) use of anticoagulants: Secondary | ICD-10-CM | POA: Diagnosis not present

## 2016-05-26 DIAGNOSIS — I4891 Unspecified atrial fibrillation: Secondary | ICD-10-CM

## 2016-05-26 LAB — POCT INR: INR: 3.4

## 2016-05-27 ENCOUNTER — Ambulatory Visit (INDEPENDENT_AMBULATORY_CARE_PROVIDER_SITE_OTHER): Payer: PPO | Admitting: Neurology

## 2016-05-27 ENCOUNTER — Other Ambulatory Visit: Payer: Self-pay | Admitting: Cardiovascular Disease

## 2016-05-27 DIAGNOSIS — F05 Delirium due to known physiological condition: Principal | ICD-10-CM

## 2016-05-27 DIAGNOSIS — R410 Disorientation, unspecified: Secondary | ICD-10-CM | POA: Diagnosis not present

## 2016-05-28 ENCOUNTER — Telehealth: Payer: Self-pay

## 2016-05-28 NOTE — Telephone Encounter (Signed)
If patients son calls back his EEG was normal.   Left vm for patients son on dpr to call back about EEG result

## 2016-05-28 NOTE — Telephone Encounter (Signed)
Rn call patients son that his fathers EEG was normal. PTs son was still concern that all the test are coming back normal. PTs son Steve Norris stated he and his brother live an hour away.The pt lives alone and they have people coming to the house to watch him. He states pt was not taking his medications at first. They have a person doing medication management for him. PT has memory issues such as knowing how to turn on a tv or adjusting his thermostat.He stated pt had a lot of personal issues with his wife being in a nursing home and worry about financial issues. Pts son stated he is taking the depakote now. Pt has an appt on 06/24/2016 with Dr. Leonie Man. Pts son would like to know what can be done before the appt.

## 2016-05-28 NOTE — Telephone Encounter (Signed)
-----   Message from Garvin Fila, MD sent at 05/28/2016  8:42 AM EST ----- Kindly inform the patient that EEG study was normal

## 2016-05-28 NOTE — Telephone Encounter (Signed)
Patients son called office and advised of EEG results per nurse.  Patients son has questions in reference to patient losing his short term memory totally.  Son is just wanting to know what to do next.  Please call

## 2016-05-29 NOTE — Telephone Encounter (Signed)
I spoke to the patient's son who told me patient had been on Depakote only for 2 weeks but is tolerating it well. I again reiterated the results of lab work, EEG and CT scan all been unremarkable. I recommend increasing Depakote to 500 mg twice daily. He voiced understanding.

## 2016-06-09 ENCOUNTER — Ambulatory Visit (INDEPENDENT_AMBULATORY_CARE_PROVIDER_SITE_OTHER): Payer: PPO | Admitting: Pharmacist

## 2016-06-09 DIAGNOSIS — Z7901 Long term (current) use of anticoagulants: Secondary | ICD-10-CM

## 2016-06-09 DIAGNOSIS — I4891 Unspecified atrial fibrillation: Secondary | ICD-10-CM | POA: Diagnosis not present

## 2016-06-09 LAB — POCT INR: INR: 5.4

## 2016-06-10 DIAGNOSIS — L304 Erythema intertrigo: Secondary | ICD-10-CM | POA: Diagnosis not present

## 2016-06-18 ENCOUNTER — Other Ambulatory Visit: Payer: Self-pay

## 2016-06-18 ENCOUNTER — Telehealth: Payer: Self-pay | Admitting: Neurology

## 2016-06-18 MED ORDER — DIVALPROEX SODIUM ER 500 MG PO TB24: 500.0000 mg | ORAL_TABLET | Freq: Every day | ORAL | 2 refills | Status: DC

## 2016-06-18 MED ORDER — DIVALPROEX SODIUM ER 500 MG PO TB24
500.0000 mg | ORAL_TABLET | Freq: Every day | ORAL | 2 refills | Status: DC
Start: 2016-06-18 — End: 2016-06-18

## 2016-06-18 NOTE — Telephone Encounter (Signed)
Medication sent via computer.

## 2016-06-18 NOTE — Telephone Encounter (Signed)
Pt's son request refill for divalproex (DEPAKOTE ER) 500 MG 24 hr tablet sent to [facility]

## 2016-06-23 ENCOUNTER — Telehealth: Payer: Self-pay | Admitting: *Deleted

## 2016-06-23 NOTE — Telephone Encounter (Signed)
Rn spoke with patients son and he r/s his fathers appt. The son is the transportation for his dad and he is moving Pt r/s for April 2018. Appt made.

## 2016-06-24 ENCOUNTER — Ambulatory Visit: Payer: PPO | Admitting: Neurology

## 2016-06-24 ENCOUNTER — Ambulatory Visit (INDEPENDENT_AMBULATORY_CARE_PROVIDER_SITE_OTHER): Payer: PPO | Admitting: *Deleted

## 2016-06-24 DIAGNOSIS — I495 Sick sinus syndrome: Secondary | ICD-10-CM | POA: Diagnosis not present

## 2016-06-24 NOTE — Progress Notes (Signed)
Remote pacemaker transmission.   

## 2016-06-25 ENCOUNTER — Encounter: Payer: Self-pay | Admitting: Cardiology

## 2016-06-27 LAB — CUP PACEART REMOTE DEVICE CHECK
Date Time Interrogation Session: 20180302145243
Implantable Lead Implant Date: 20110330
Implantable Lead Location: 753859
Implantable Pulse Generator Implant Date: 20110330
MDC IDC LEAD IMPLANT DT: 20110330
MDC IDC LEAD LOCATION: 753860
Pulse Gen Model: 2210
Pulse Gen Serial Number: 7124006

## 2016-06-28 ENCOUNTER — Other Ambulatory Visit: Payer: Self-pay | Admitting: Cardiovascular Disease

## 2016-06-30 NOTE — Telephone Encounter (Signed)
Rx(s) sent to pharmacy electronically.  

## 2016-08-07 ENCOUNTER — Ambulatory Visit: Payer: Self-pay | Admitting: Neurology

## 2016-08-08 ENCOUNTER — Emergency Department (HOSPITAL_COMMUNITY): Payer: PPO

## 2016-08-08 ENCOUNTER — Inpatient Hospital Stay (HOSPITAL_COMMUNITY)
Admission: EM | Admit: 2016-08-08 | Discharge: 2016-08-13 | DRG: 292 | Disposition: A | Discharge status: Skilled Nursing Facility | Payer: PPO | Attending: Internal Medicine | Admitting: Internal Medicine

## 2016-08-08 ENCOUNTER — Encounter (HOSPITAL_COMMUNITY): Payer: Self-pay

## 2016-08-08 DIAGNOSIS — Z880 Allergy status to penicillin: Secondary | ICD-10-CM | POA: Diagnosis not present

## 2016-08-08 DIAGNOSIS — I5033 Acute on chronic diastolic (congestive) heart failure: Secondary | ICD-10-CM | POA: Diagnosis not present

## 2016-08-08 DIAGNOSIS — J449 Chronic obstructive pulmonary disease, unspecified: Secondary | ICD-10-CM | POA: Diagnosis not present

## 2016-08-08 DIAGNOSIS — Z79899 Other long term (current) drug therapy: Secondary | ICD-10-CM | POA: Diagnosis not present

## 2016-08-08 DIAGNOSIS — N320 Bladder-neck obstruction: Secondary | ICD-10-CM | POA: Diagnosis not present

## 2016-08-08 DIAGNOSIS — Z7901 Long term (current) use of anticoagulants: Secondary | ICD-10-CM | POA: Diagnosis not present

## 2016-08-08 DIAGNOSIS — I5031 Acute diastolic (congestive) heart failure: Secondary | ICD-10-CM | POA: Diagnosis not present

## 2016-08-08 DIAGNOSIS — R41841 Cognitive communication deficit: Secondary | ICD-10-CM | POA: Diagnosis not present

## 2016-08-08 DIAGNOSIS — I10 Essential (primary) hypertension: Secondary | ICD-10-CM | POA: Diagnosis present

## 2016-08-08 DIAGNOSIS — J441 Chronic obstructive pulmonary disease with (acute) exacerbation: Secondary | ICD-10-CM | POA: Diagnosis present

## 2016-08-08 DIAGNOSIS — Z888 Allergy status to other drugs, medicaments and biological substances status: Secondary | ICD-10-CM | POA: Diagnosis not present

## 2016-08-08 DIAGNOSIS — Z87891 Personal history of nicotine dependence: Secondary | ICD-10-CM

## 2016-08-08 DIAGNOSIS — R0600 Dyspnea, unspecified: Secondary | ICD-10-CM | POA: Diagnosis not present

## 2016-08-08 DIAGNOSIS — N401 Enlarged prostate with lower urinary tract symptoms: Secondary | ICD-10-CM | POA: Diagnosis not present

## 2016-08-08 DIAGNOSIS — L8990 Pressure ulcer of unspecified site, unspecified stage: Secondary | ICD-10-CM | POA: Diagnosis present

## 2016-08-08 DIAGNOSIS — J418 Mixed simple and mucopurulent chronic bronchitis: Secondary | ICD-10-CM | POA: Diagnosis not present

## 2016-08-08 DIAGNOSIS — E785 Hyperlipidemia, unspecified: Secondary | ICD-10-CM | POA: Diagnosis not present

## 2016-08-08 DIAGNOSIS — I4891 Unspecified atrial fibrillation: Secondary | ICD-10-CM | POA: Diagnosis not present

## 2016-08-08 DIAGNOSIS — I2510 Atherosclerotic heart disease of native coronary artery without angina pectoris: Secondary | ICD-10-CM | POA: Diagnosis not present

## 2016-08-08 DIAGNOSIS — J811 Chronic pulmonary edema: Secondary | ICD-10-CM | POA: Diagnosis not present

## 2016-08-08 DIAGNOSIS — N138 Other obstructive and reflux uropathy: Secondary | ICD-10-CM | POA: Diagnosis not present

## 2016-08-08 DIAGNOSIS — I959 Hypotension, unspecified: Secondary | ICD-10-CM | POA: Diagnosis not present

## 2016-08-08 DIAGNOSIS — R609 Edema, unspecified: Secondary | ICD-10-CM

## 2016-08-08 DIAGNOSIS — Z955 Presence of coronary angioplasty implant and graft: Secondary | ICD-10-CM | POA: Diagnosis not present

## 2016-08-08 DIAGNOSIS — I509 Heart failure, unspecified: Secondary | ICD-10-CM | POA: Diagnosis not present

## 2016-08-08 DIAGNOSIS — R0609 Other forms of dyspnea: Secondary | ICD-10-CM | POA: Diagnosis not present

## 2016-08-08 DIAGNOSIS — J80 Acute respiratory distress syndrome: Secondary | ICD-10-CM | POA: Diagnosis not present

## 2016-08-08 DIAGNOSIS — N400 Enlarged prostate without lower urinary tract symptoms: Secondary | ICD-10-CM | POA: Diagnosis not present

## 2016-08-08 DIAGNOSIS — Z950 Presence of cardiac pacemaker: Secondary | ICD-10-CM | POA: Diagnosis present

## 2016-08-08 DIAGNOSIS — I480 Paroxysmal atrial fibrillation: Secondary | ICD-10-CM | POA: Diagnosis not present

## 2016-08-08 DIAGNOSIS — R600 Localized edema: Secondary | ICD-10-CM | POA: Diagnosis not present

## 2016-08-08 DIAGNOSIS — M6281 Muscle weakness (generalized): Secondary | ICD-10-CM | POA: Diagnosis not present

## 2016-08-08 DIAGNOSIS — I110 Hypertensive heart disease with heart failure: Secondary | ICD-10-CM | POA: Diagnosis not present

## 2016-08-08 DIAGNOSIS — R278 Other lack of coordination: Secondary | ICD-10-CM | POA: Diagnosis not present

## 2016-08-08 DIAGNOSIS — R2681 Unsteadiness on feet: Secondary | ICD-10-CM | POA: Diagnosis not present

## 2016-08-08 LAB — CBC WITH DIFFERENTIAL/PLATELET
BASOS PCT: 1 %
Basophils Absolute: 0.1 10*3/uL (ref 0.0–0.1)
EOS ABS: 0.5 10*3/uL (ref 0.0–0.7)
EOS PCT: 6 %
HCT: 28.9 % — ABNORMAL LOW (ref 39.0–52.0)
Hemoglobin: 9.4 g/dL — ABNORMAL LOW (ref 13.0–17.0)
LYMPHS ABS: 2.8 10*3/uL (ref 0.7–4.0)
Lymphocytes Relative: 35 %
MCH: 30.2 pg (ref 26.0–34.0)
MCHC: 32.5 g/dL (ref 30.0–36.0)
MCV: 92.9 fL (ref 78.0–100.0)
MONOS PCT: 19 %
Monocytes Absolute: 1.5 10*3/uL — ABNORMAL HIGH (ref 0.1–1.0)
Neutro Abs: 3.1 10*3/uL (ref 1.7–7.7)
Neutrophils Relative %: 39 %
PLATELETS: 341 10*3/uL (ref 150–400)
RBC: 3.11 MIL/uL — ABNORMAL LOW (ref 4.22–5.81)
RDW: 17 % — AB (ref 11.5–15.5)
WBC: 7.9 10*3/uL (ref 4.0–10.5)

## 2016-08-08 LAB — BASIC METABOLIC PANEL
Anion gap: 5 (ref 5–15)
BUN: 18 mg/dL (ref 6–20)
CALCIUM: 8.3 mg/dL — AB (ref 8.9–10.3)
CO2: 29 mmol/L (ref 22–32)
CREATININE: 0.53 mg/dL — AB (ref 0.61–1.24)
Chloride: 104 mmol/L (ref 101–111)
GFR calc Af Amer: >60 mL/min (ref >60)
GLUCOSE: 99 mg/dL (ref 65–99)
Potassium: 3.8 mmol/L (ref 3.5–5.1)
Sodium: 138 mmol/L (ref 135–145)

## 2016-08-08 LAB — I-STAT TROPONIN, ED: Troponin i, poc: 0 ng/mL (ref 0.00–0.08)

## 2016-08-08 LAB — PROTIME-INR
INR: 2.16
PROTHROMBIN TIME: 24.5 s — AB (ref 11.4–15.2)

## 2016-08-08 LAB — BRAIN NATRIURETIC PEPTIDE: B Natriuretic Peptide: 559.5 pg/mL — ABNORMAL HIGH (ref 0.0–100.0)

## 2016-08-08 MED ORDER — FUROSEMIDE 10 MG/ML IJ SOLN
40.0000 mg | Freq: Once | INTRAMUSCULAR | Status: AC
  Administered 2016-08-08: 40 mg via INTRAVENOUS
  Filled 2016-08-08: qty 4

## 2016-08-08 MED ORDER — IPRATROPIUM-ALBUTEROL 0.5-2.5 (3) MG/3ML IN SOLN: 3.0000 mL | RESPIRATORY_TRACT | Status: AC

## 2016-08-08 NOTE — ED Provider Notes (Signed)
Emergency Department Provider Note   I have reviewed the triage vital signs and the nursing notes.   HISTORY  Chief Complaint Leg Swelling   HPI Steve Norris is a 83 y.o. male with PMH of CHF, CAD, GERD, COPD, HTN, and HLD presents to the emergency department for evaluation of worsening bilateral lower extremity edema and dyspnea with exertion. The patient states that she's been compliant with his Lasix 20 mg daily. Patient's son states that he refuses to follow up with the primary care physician despite making promises to do so. She has had significantly increased lower extremity edema over the past week. He does seem to be having some worsening dyspnea with exertion but not at rest. No chest pain. No fevers or chills. No productive cough. No radiation of symptoms.   Past Medical History:  Diagnosis Date  . Arthritis   . Asthma   . Barrett's esophagus 03-19-2009   EGD  . CAD (coronary artery disease)   . Cataract    REMOVED  . COPD (chronic obstructive pulmonary disease) ([place])   . Diverticulosis 03-19-2009   Colonoscopy   . Erosive esophagitis 03-19-2009   EGD  . GERD (gastroesophageal reflux disease)   . Hiatal hernia 03-19-2009   EGD  . Hyperlipidemia   . Hypertension   . Internal hemorrhoids 03-19-2009   Colonoscopy  . Paroxysmal atrial fibrillation [facility])     Patient Active Problem List   Diagnosis Date Noted  . Anemia 03/25/2016  . SSS (sick sinus syndrome) ([place]) 03/25/2016  . CAP (community acquired pneumonia)   . Atrial fibrillation with rapid ventricular response ([place]) 11/04/2014  . Dyspnea 12/15/2012  . Lachanda Buczek term current use of anticoagulant therapy 07/14/2012  . COPD (chronic obstructive pulmonary disease) ([place]) 02/23/2012  . CAD (coronary artery disease) 02/23/2012  . S/P placement of cardiac pacemaker 02/23/2012  . Atrial fibrillation ([place]) 02/23/2012  . BARRETT'S ESOPHAGUS 04/30/2009    Past Surgical History:  Procedure Laterality Date  .  CARDIAC CATHETERIZATION  02/12/2009   mild ostial left main disease,patent LAD & RCA stents   . CARDIOVERSION N/A 11/06/2014   Procedure: CARDIOVERSION;  Surgeon: [name], MD;  Location: [facility] ENDOSCOPY;  Service: Cardiovascular;  Laterality: N/A;  . CARDIOVERSION N/A 08/02/2015   Procedure: CARDIOVERSION;  Surgeon: Sueanne Margarita, MD;  Location: [facility] ENDOSCOPY;  Service: Cardiovascular;  Laterality: N/A;  . CATARACT EXTRACTION    . COLONOSCOPY    . CORONARY STENT PLACEMENT  2006   Right Coronary Artery Cypher stents placed 2006  . HEMORRHOID SURGERY    . LEFT HEART CATHETERIZATION WITH CORONARY ANGIOGRAM N/A 07/01/2013   Procedure: LEFT HEART CATHETERIZATION WITH CORONARY ANGIOGRAM;  Surgeon: Sanda Klein, MD;  Location: [place] CATH LAB;  Service: Cardiovascular;  Laterality: N/A;  . NM MYOCAR PERF WALL MOTION  08/14/2008   no significant ischemia EF 64%  . PACEMAKER INSERTION  07/25/2009   [employer]ude Accent  . TEE WITHOUT CARDIOVERSION N/A 08/02/2015   Procedure: TRANSESOPHAGEAL ECHOCARDIOGRAM (TEE);  Surgeon: Sueanne Margarita, MD;  Location: [facility] ENDOSCOPY;  Service: Cardiovascular;  Laterality: N/A;  . TONSILLECTOMY    . US ECHOCARDIOGRAPHY  07/12/2009   borderline LA enlargement,mild mitral annular ca+, AOV mildly sclerotic,trace AI.  Marland Kitchen VASECTOMY      Current Outpatient Rx  . Order #: [id number] Class: Historical Med  . Order #: [id number] Class: Normal  . Order #: [id number] Class: Normal  . Order #: [id number] Class: Historical Med  . Order #: [id number] Class: Historical Med  .  Order #: [id number] Class: Historical Med  . Order #: [id number] Class: Normal  . Order #: [id number] Class: Historical Med  . Order #: [id number] Class: Historical Med  . Order #: [id number] Class: Normal  . Order #: [id number] Class: Normal  . Order #: [id number] Class: Historical Med  . Order #: [id number] Class: Historical Med  . Order #: [id number] Class: Normal    Allergies Amiodarone and Penicillins  Family History  Problem  Relation Age of Onset  . Heart disease Mother   . Heart disease Father   . Colon cancer Neg Hx     Social History Social History  Substance Use Topics  . Smoking status: Former Research scientist (life sciences)  . Smokeless tobacco: Never Used     Comment: Quit smoking 1996  . Alcohol use No    Review of Systems  Constitutional: No fever/chills Eyes: No visual changes. ENT: No sore throat. Cardiovascular: Denies chest pain. Respiratory: Positive exertional shortness of breath. Gastrointestinal: No abdominal pain.  No nausea, no vomiting.  No diarrhea.  No constipation. Genitourinary: Negative for dysuria. Musculoskeletal: Negative for back pain. Positive leg swelling.  Skin: Negative for rash. Neurological: Negative for headaches, focal weakness or numbness.  10-point ROS otherwise negative.  ____________________________________________   PHYSICAL EXAM:  VITAL SIGNS: ED Triage Vitals  Enc Vitals Group     BP 08/08/16 1823 (!) 147/74     Pulse Rate 08/08/16 1823 66     Resp 08/08/16 1823 18     Temp 08/08/16 1823 98.7 F (37.1 C)     Temp Source 08/08/16 1823 Oral     SpO2 08/08/16 1823 100 %     Pain Score 08/08/16 1931 0   Constitutional: Alert and oriented. Well appearing and in no acute distress. Eyes: Conjunctivae are normal.  Head: Atraumatic. Nose: No congestion/rhinnorhea. Mouth/Throat: Mucous membranes are moist.  Neck: No stridor. Cardiovascular: Normal rate, regular rhythm. Good peripheral circulation. Grossly normal heart sounds.   Respiratory: Normal respiratory effort.  No retractions. Lungs crackles at the bases bilaterally.  Gastrointestinal: Soft and nontender. No distention.  Musculoskeletal: No lower extremity tenderness. Positive 3+ pitting edema bilaterally. No gross deformities of extremities. Neurologic:  Normal speech and language. No gross focal neurologic deficits are appreciated.  Skin:  Skin is warm, dry and intact. No rash  noted.  ____________________________________________   LABS (all labs ordered are listed, but only abnormal results are displayed)  Labs Reviewed  BASIC METABOLIC PANEL - Abnormal; Notable for the following:       Result Value   Creatinine, Ser 0.53 (*)    Calcium 8.3 (*)    All other components within normal limits  BRAIN NATRIURETIC PEPTIDE - Abnormal; Notable for the following:    B Natriuretic Peptide 559.5 (*)    All other components within normal limits  CBC WITH DIFFERENTIAL/PLATELET - Abnormal; Notable for the following:    RBC 3.11 (*)    Hemoglobin 9.4 (*)    HCT 28.9 (*)    RDW 17.0 (*)    Monocytes Absolute 1.5 (*)    All other components within normal limits  PROTIME-INR - Abnormal; Notable for the following:    Prothrombin Time 24.5 (*)    All other components within normal limits   ____________________________________________  EKG   EKG Interpretation  Date/Time:  Friday August 08 2016 19:26:21 EDT Ventricular Rate:  71 PR Interval:    QRS Duration: 118 QT Interval:  418 QTC Calculation: 455 R Axis:   13 Text Interpretation:  Atrial-paced rhythm Incomplete right bundle branch  block no stemi  Confirmed by Leviticus Harton MD, Amreen Raczkowski [id number]) on 08/08/2016 7:50:10 PM       ____________________________________________  RADIOLOGY  Dg Chest 2 View  Result Date: 08/08/2016 CLINICAL DATA:  83 year old male with worsening generalized edema. Initial encounter. EXAM: CHEST  2 VIEW COMPARISON:  Chest radiographs 03/12/2016 and earlier. FINDINGS: Semi upright AP and lateral views of the chest. Mildly increased basilar predominant pulmonary interstitial markings compared to 2017. Stable lung volumes. Stable mild cardiomegaly. Stable left chest stool lead cardiac pacemaker. Calcified aortic atherosclerosis. Other mediastinal contours are within normal limits. No pneumothorax. No pleural effusion. No confluent pulmonary opacity. Osteopenia. No acute osseous abnormality identified.  Negative visible bowel gas pattern. IMPRESSION: 1. Basilar predominant increased pulmonary vascularity compatible with mild or developing interstitial edema. No pleural effusion. 2. Calcified aortic atherosclerosis. Electronically Signed   By: Genevie Ann M.D.   On: 08/08/2016 19:40    ____________________________________________   PROCEDURES  Procedure(s) performed:   Procedures  None ____________________________________________   INITIAL IMPRESSION / ASSESSMENT AND PLAN / ED COURSE  Pertinent labs & imaging results that were available during my care of the patient were reviewed by me and considered in my medical decision making (see chart for details).  Patient resents to the emergency room in for evaluation of worsening lower extremity edema and dyspnea on exertion. He has faint crackles at the bases and borderline hypoxemia at rest. His 3+ pitting edema bilateral lower extremities. No other acute findings on exam. Plan for labs  10:09 PM  Patient ambulated with pulse ox and desaturated to 84%. With crackles in significant edema along with poor primary care physician follow-up I think he will benefit from hospitalization and diuresis here.   Discussed patient's case with Hospitalist. Patient and family (if present) updated with plan. Care transferred to Hospitalist service.  I reviewed all nursing notes, vitals, pertinent old records, EKGs, labs, imaging (as available).  ____________________________________________  FINAL CLINICAL IMPRESSION(S) / ED DIAGNOSES  Final diagnoses:  Peripheral edema  Dyspnea on exertion     MEDICATIONS GIVEN DURING THIS VISIT:  Medications  ipratropium-albuterol (DUONEB) 0.5-2.5 (3) MG/3ML nebulizer solution 3 mL (3 mLs Nebulization Not Given 08/09/16 0212)  rosuvastatin (CRESTOR) tablet 20 mg (20 mg Oral Given 08/10/16 1750)  nebivolol (BYSTOLIC) tablet 5 mg (5 mg Oral Given 08/10/16 0848)  sotalol (BETAPACE) tablet 80 mg (80 mg Oral Given  08/10/16 2254)  sertraline (ZOLOFT) tablet 50 mg (50 mg Oral Given 08/10/16 0848)  tamsulosin (FLOMAX) capsule 0.4 mg (0.4 mg Oral Given 08/10/16 0848)  neomycin-bacitracin-polymyxin (NEOSPORIN) ointment 1 application (not administered)  finasteride (PROSCAR) tablet 5 mg (5 mg Oral Given 08/10/16 2254)  senna-docusate (Senokot-S) tablet 1 tablet (1 tablet Oral Given 08/10/16 2254)  temazepam (RESTORIL) capsule 30 mg (30 mg Oral Given 08/10/16 2254)  nitroGLYCERIN (NITROSTAT) SL tablet 0.4 mg (not administered)  potassium chloride SA (K-DUR,KLOR-CON) CR tablet 20 mEq (20 mEq Oral Given 08/10/16 0847)  sodium chloride flush (NS) 0.9 % injection 3 mL (3 mLs Intravenous Given 08/10/16 2256)  sodium chloride flush (NS) 0.9 % injection 3 mL (not administered)  0.9 %  sodium chloride infusion (not administered)  acetaminophen (TYLENOL) tablet 650 mg (not administered)  ondansetron (ZOFRAN) injection 4 mg (not administered)  furosemide (LASIX) injection 40 mg (40 mg Intravenous Given 08/10/16 1750)  ipratropium-albuterol (DUONEB) 0.5-2.5 (3) MG/3ML nebulizer solution 3 mL (not administered)  Warfarin - Pharmacist Dosing Inpatient ( Does not apply Duplicate 9/98/33 8250)  Gerhardt's butt cream ( Topical  Given 08/10/16 2256)  furosemide (LASIX) injection 40 mg (40 mg Intravenous Given 08/08/16 2229)  warfarin (COUMADIN) tablet 2.5 mg (2.5 mg Oral Given 08/09/16 1719)  potassium chloride SA (K-DUR,KLOR-CON) CR tablet 40 mEq (40 mEq Oral Given 08/09/16 0736)  sorbitol 70 % solution 30 mL (30 mLs Oral Given 08/09/16 2032)  warfarin (COUMADIN) tablet 5 mg (5 mg Oral Given 08/10/16 1750)     NEW OUTPATIENT MEDICATIONS STARTED DURING THIS VISIT:  None   Note:  This document was prepared using Dragon voice recognition software and may include unintentional dictation errors.  Nanda Quinton, MD Emergency Medicine   Margette Fast, MD 08/11/16 [phone]

## 2016-08-08 NOTE — ED Notes (Signed)
[name] (pt's son) [phone]

## 2016-08-08 NOTE — ED Notes (Signed)
Pt able to ambulate in the hall--- pt's  O2 saturation 85% on room air while ambulating; pt was observed to have increased respiratory efforts while ambulating.

## 2016-08-08 NOTE — ED Triage Notes (Signed)
Per EMS. Pt from home.  Pt c/o generalized edema.  Pt with hx of chf.  Worsening over past week.  On lasix therapy and taking as prescribed.  No SOB.  Pt a/o x 3 at baseline.  Hx of dementia.  Pt also c/o pain on buttocks per family.  Vitals:  118/78, hr 78, resp 16, 98% ra

## 2016-08-08 NOTE — ED Notes (Signed)
Bed: [facility]94 Expected date: 08/08/16 Expected time: 6:11 PM Means of arrival: Ambulance Comments: 84 yo ext edema

## 2016-08-08 NOTE — H&P (Signed)
History and Physical    Steve Norris GGY:[id number] DOB: 05-Sep-1929 DOA: 08/08/2016  Referring MD/NP/PA: Dr. Laverta Baltimore PCP: Horatio Pel, MD  Patient coming from: Home  Chief Complaint: Leg swelling and shortness of breath  HPI: Steve Norris is a 83 y.o. male with medical history significant of HTN, HLD, COPD, A. fib, SSS s/p PM; who presents with worsening bilateral lower extremity edema and shortness of breath with exertion over the last week. Patient reports taking his prescription for 20 mg of Lasix daily. At baseline patient lives at home alone, but has aids that check on him routinely. Patient complains of being easily winded and short of breath walking any significant distance. He does not routinely weigh himself and reports that he has been sleeping in a recliner for quite some time. Denies any chest pain, cough, diarrhea, nausea, vomiting, abdominal pain, dysuria, blood in stool/urine, or falls. The patient's son is present at bedside and helps provide additional history. He thinks that his father supposedly on inhalers, but has not had them filled in quite some time.  Followed by Dr. Sanda Klein of cardiology. Patient last remote pacemaker check was on 06/24/2016.  ED Course: Upon admission to the emergency department patient was seen to be afebrile, pulse 62-68, respirations 17-19, blood pressures maintained, with 2 saturations as low as 85% with ambulation. Labs revealed WBC 7.9, hemoglobin 9.4, BNP 559.5 and all other labs relatively within normal limits. Chest x-ray shows signs of mild interstitial edema. Patient was given 40 mg of Lasix IV in the ED.  Review of Systems: As per HPI otherwise 10 point review of systems negative.   Past Medical History:  Diagnosis Date  . Arthritis   . Asthma   . Barrett's esophagus 03-19-2009   EGD  . CAD (coronary artery disease)   . Cataract    REMOVED  . COPD (chronic obstructive pulmonary disease) ([place])   . Diverticulosis  03-19-2009   Colonoscopy   . Erosive esophagitis 03-19-2009   EGD  . GERD (gastroesophageal reflux disease)   . Hiatal hernia 03-19-2009   EGD  . Hyperlipidemia   . Hypertension   . Internal hemorrhoids 03-19-2009   Colonoscopy  . Paroxysmal atrial fibrillation [facility])     Past Surgical History:  Procedure Laterality Date  . CARDIAC CATHETERIZATION  02/12/2009   mild ostial left main disease,patent LAD & RCA stents   . CARDIOVERSION N/A 11/06/2014   Procedure: CARDIOVERSION;  Surgeon: [name], MD;  Location: [facility] ENDOSCOPY;  Service: Cardiovascular;  Laterality: N/A;  . CARDIOVERSION N/A 08/02/2015   Procedure: CARDIOVERSION;  Surgeon: Sueanne Margarita, MD;  Location: [facility] ENDOSCOPY;  Service: Cardiovascular;  Laterality: N/A;  . CATARACT EXTRACTION    . COLONOSCOPY    . CORONARY STENT PLACEMENT  2006   Right Coronary Artery Cypher stents placed 2006  . HEMORRHOID SURGERY    . LEFT HEART CATHETERIZATION WITH CORONARY ANGIOGRAM N/A 07/01/2013   Procedure: LEFT HEART CATHETERIZATION WITH CORONARY ANGIOGRAM;  Surgeon: Sanda Klein, MD;  Location: [place] CATH LAB;  Service: Cardiovascular;  Laterality: N/A;  . NM MYOCAR PERF WALL MOTION  08/14/2008   no significant ischemia EF 64%  . PACEMAKER INSERTION  07/25/2009   [employer]  . TEE WITHOUT CARDIOVERSION N/A 08/02/2015   Procedure: TRANSESOPHAGEAL ECHOCARDIOGRAM (TEE);  Surgeon: Sueanne Margarita, MD;  Location: [facility] ENDOSCOPY;  Service: Cardiovascular;  Laterality: N/A;  . TONSILLECTOMY    . US ECHOCARDIOGRAPHY  07/12/2009   borderline LA enlargement,mild mitral  annular ca+, AOV mildly sclerotic,trace AI.  Marland Kitchen VASECTOMY       reports that he has quit smoking. He has never used smokeless tobacco. He reports that he does not drink alcohol or use drugs.  Allergies  Allergen Reactions  . Amiodarone Nausea And Vomiting  . Penicillins Hives, Swelling and Rash    Family History  Problem Relation Age of Onset  . Heart disease Mother   .  Heart disease Father   . Colon cancer Neg Hx     Prior to Admission medications   Medication Sig Start Date End Date Taking? Authorizing Provider  finasteride (PROSCAR) 5 MG tablet Take 5 mg by mouth at bedtime.  02/17/12  Yes Historical Provider, MD  furosemide (LASIX) 20 MG tablet Take 1 tablet (20 mg total) by mouth daily. 01/10/16  Yes Mihai Croitoru, MD  nebivolol (BYSTOLIC) 5 MG tablet Take 1 tablet (5 mg total) by mouth daily. 06/30/16  Yes Mihai Croitoru, MD  rosuvastatin (CRESTOR) 20 MG tablet 20 mg daily.  06/20/16  Yes Historical Provider, MD  senna-docusate (SENNA-S) 8.6-50 MG per tablet Take 1 tablet by mouth at bedtime.   Yes Historical Provider, MD  sertraline (ZOLOFT) 50 MG tablet Take 1 tablet by mouth daily. 03/07/16  Yes Historical Provider, MD  sotalol (BETAPACE) 80 MG tablet take 1 tablet by mouth every 12 hours 02/11/16  Yes Mihai Croitoru, MD  tamsulosin (FLOMAX) 0.4 MG CAPS capsule Take 1 capsule by mouth daily. 12/27/15  Yes Historical Provider, MD  temazepam (RESTORIL) 30 MG capsule Take 30 mg by mouth at bedtime. 05/24/13  Yes Historical Provider, MD  warfarin (COUMADIN) 5 MG tablet take 1 tablet by mouth once daily (OR AS DIRECRED BY COUMADIN CLINIC) Patient taking differently: Take 0.5-1 tablet by mouth daily as directed 05/28/16  Yes Mihai Croitoru, MD  divalproex (DEPAKOTE ER) 500 MG 24 hr tablet Take 1 tablet (500 mg total) by mouth daily. Patient not taking: Reported on 08/08/2016 06/18/16   Garvin Fila, MD  neomycin-bacitracin-polymyxin (NEOSPORIN) OINT Apply 1 application topically daily as needed for wound care. Reported on 08/31/2015    Historical Provider, MD  nitroGLYCERIN (NITROSTAT) 0.4 MG SL tablet Place 0.4 mg under the tongue every 5 (five) minutes as needed for chest pain.     Historical Provider, MD  potassium chloride SA (K-DUR,KLOR-CON) 20 MEQ tablet take 1 tablet by mouth once daily 12/18/15   Sanda Klein, MD    Physical Exam:    Constitutional:  Elderly male in NAD, calm, comfortable Vitals:   08/08/16 1900 08/08/16 2145 08/08/16 2146 08/08/16 2203  BP: 120/60 (!) 121/58 (!) 121/58   Pulse: 64 68 66   Resp: 19 18 19    Temp:      TempSrc:      SpO2: 93% (!) 89% 93% (S) (!) 85%   Eyes: PERRL, lids and conjunctivae normal ENMT: Mucous membranes are moist. Posterior pharynx clear of any exudate or lesions.Normal dentition.  Neck: normal, supple, no masses, no thyromegaly. + JVD. Respiratory: Bilateral expiratory wheezes with crackles appreciated. Patient able to talk in full sentences with normal respiratory rate. Cardiovascular: Regular rate and rhythm, no murmurs / rubs / gallops. +3 pitting bilateral lower extremity edema. 2+ pedal pulses. No carotid bruits.  Abdomen: no tenderness, no masses palpated. No hepatosplenomegaly. Bowel sounds positive.  Musculoskeletal: no clubbing / cyanosis. No joint deformity upper and lower extremities. Good ROM, no contractures. Normal muscle tone.  Skin: no rashes, lesions, ulcers. No induration  Neurologic: CN 2-12 grossly intact. Sensation intact, DTR normal. Strength 5/5 in all 4.  Psychiatric: Normal judgment and insight. Alert and oriented x 3. Normal mood.     Labs on Admission: I have personally reviewed following labs and imaging studies  CBC:  Recent Labs Lab 08/08/16 1935  WBC 7.9  NEUTROABS 3.1  HGB 9.4*  HCT 28.9*  MCV 92.9  PLT 732   Basic Metabolic Panel:  Recent Labs Lab 08/08/16 1935  NA 138  K 3.8  CL 104  CO2 29  GLUCOSE 99  BUN 18  CREATININE 0.53*  CALCIUM 8.3*   GFR: CrCl cannot be calculated (Unknown ideal weight.). Liver Function Tests: No results for input(s): AST, ALT, ALKPHOS, BILITOT, PROT, ALBUMIN in the last 168 hours. No results for input(s): LIPASE, AMYLASE in the last 168 hours. No results for input(s): AMMONIA in the last 168 hours. Coagulation Profile:  Recent Labs Lab 08/08/16 1935  INR 2.16   Cardiac Enzymes: No results for  input(s): CKTOTAL, CKMB, CKMBINDEX, TROPONINI in the last 168 hours. BNP (last 3 results) No results for input(s): PROBNP in the last 8760 hours. HbA1C: No results for input(s): HGBA1C in the last 72 hours. CBG: No results for input(s): GLUCAP in the last 168 hours. Lipid Profile: No results for input(s): CHOL, HDL, LDLCALC, TRIG, CHOLHDL, LDLDIRECT in the last 72 hours. Thyroid Function Tests: No results for input(s): TSH, T4TOTAL, FREET4, T3FREE, THYROIDAB in the last 72 hours. Anemia Panel: No results for input(s): VITAMINB12, FOLATE, FERRITIN, TIBC, IRON, RETICCTPCT in the last 72 hours. Urine analysis: No results found for: COLORURINE, APPEARANCEUR, LABSPEC, PHURINE, GLUCOSEU, HGBUR, BILIRUBINUR, KETONESUR, PROTEINUR, UROBILINOGEN, NITRITE, LEUKOCYTESUR Sepsis Labs: No results found for this or any previous visit (from the past 240 hour(s)).   Radiological Exams on Admission: Dg Chest 2 View  Result Date: 08/08/2016 CLINICAL DATA:  83 year old male with worsening generalized edema. Initial encounter. EXAM: CHEST  2 VIEW COMPARISON:  Chest radiographs 03/12/2016 and earlier. FINDINGS: Semi upright AP and lateral views of the chest. Mildly increased basilar predominant pulmonary interstitial markings compared to 2017. Stable lung volumes. Stable mild cardiomegaly. Stable left chest stool lead cardiac pacemaker. Calcified aortic atherosclerosis. Other mediastinal contours are within normal limits. No pneumothorax. No pleural effusion. No confluent pulmonary opacity. Osteopenia. No acute osseous abnormality identified. Negative visible bowel gas pattern. IMPRESSION: 1. Basilar predominant increased pulmonary vascularity compatible with mild or developing interstitial edema. No pleural effusion. 2. Calcified aortic atherosclerosis. Electronically Signed   By: Genevie Ann M.D.   On: 08/08/2016 19:40    EKG: Independently reviewed. Atrial placed rhythm with incomplete RBBB  Assessment/Plan CHF  exacerbation: Acute. Patient presents with bilateral lower extremity edema and shortness of breath. Chest x-ray shows signs of interstitial edema. BNP elevated at 559.5. Last echocardiogram on 07/2015 with EF noted to be 55-60%. Patient was given 40 mg of Lasix IV in the emergency department with good diuresis.  - Admit to a telemetry bed - Heart failure orders set  initiated  - Continuous pulse oximetry with nasal cannula oxygen as needed to keep O2 saturations >92% - Strict I&Os and daily weights - Elevate lower extremities - Lasix 40 mg IV Bid - Reassess in a.m. and adjust diuresis as needed. - Check echocardiogram - Optimize medical management as able - Will need consultation to cardiology in a.m. he sees Dr.Croitoru   Atrial fibrillation with H/O SSS s/p PM on chronic anticoagulation: PM last remotely interrogated on 2/27 - Continue sotalol and  Coumadin per pharmacy  Essential hypertension - Continue by systolic   COPD with mild exacerbation: Acute.  - DuoNeb prn shortness of breath/wheezing  Hyperlipidemia - Continue Crestor  BPH - Continue Flomax and Proscar  DVT prophylaxis: On Coumadin   Code Status: Full Family Communication: Discussed plan of care with patient and family present at bedside  Disposition Plan: Likely discharge home once medically stable Consults called:   Admission status: Inpatient  Norval Morton MD [facility] Pager [phone]  If 7PM-7AM, please contact night-coverage [email or web address] Password [facility]  08/08/2016, 11:16 PM

## 2016-08-09 ENCOUNTER — Encounter (HOSPITAL_COMMUNITY): Payer: Self-pay

## 2016-08-09 ENCOUNTER — Inpatient Hospital Stay (HOSPITAL_COMMUNITY): Payer: PPO

## 2016-08-09 DIAGNOSIS — R609 Edema, unspecified: Secondary | ICD-10-CM

## 2016-08-09 DIAGNOSIS — I10 Essential (primary) hypertension: Secondary | ICD-10-CM | POA: Diagnosis present

## 2016-08-09 DIAGNOSIS — N400 Enlarged prostate without lower urinary tract symptoms: Secondary | ICD-10-CM | POA: Diagnosis present

## 2016-08-09 DIAGNOSIS — L8990 Pressure ulcer of unspecified site, unspecified stage: Secondary | ICD-10-CM | POA: Diagnosis present

## 2016-08-09 DIAGNOSIS — I509 Heart failure, unspecified: Secondary | ICD-10-CM

## 2016-08-09 LAB — ECHOCARDIOGRAM COMPLETE
HEIGHTINCHES: 69 in
Weight: 2561.6 oz

## 2016-08-09 LAB — BASIC METABOLIC PANEL
ANION GAP: 5 (ref 5–15)
BUN: 15 mg/dL (ref 6–20)
CALCIUM: 8.4 mg/dL — AB (ref 8.9–10.3)
CO2: 34 mmol/L — ABNORMAL HIGH (ref 22–32)
Chloride: 99 mmol/L — ABNORMAL LOW (ref 101–111)
Creatinine, Ser: 0.53 mg/dL — ABNORMAL LOW (ref 0.61–1.24)
Glucose, Bld: 94 mg/dL (ref 65–99)
POTASSIUM: 3.3 mmol/L — AB (ref 3.5–5.1)
Sodium: 138 mmol/L (ref 135–145)

## 2016-08-09 LAB — CBC WITH DIFFERENTIAL/PLATELET
BASOS ABS: 0.1 10*3/uL (ref 0.0–0.1)
BASOS PCT: 1 %
EOS ABS: 0.5 10*3/uL (ref 0.0–0.7)
EOS PCT: 6 %
HCT: 29 % — ABNORMAL LOW (ref 39.0–52.0)
Hemoglobin: 9.3 g/dL — ABNORMAL LOW (ref 13.0–17.0)
Lymphocytes Relative: 41 %
Lymphs Abs: 3.1 10*3/uL (ref 0.7–4.0)
MCH: 29.2 pg (ref 26.0–34.0)
MCHC: 32.1 g/dL (ref 30.0–36.0)
MCV: 90.9 fL (ref 78.0–100.0)
MONO ABS: 1.5 10*3/uL — AB (ref 0.1–1.0)
Monocytes Relative: 20 %
Neutro Abs: 2.5 10*3/uL (ref 1.7–7.7)
Neutrophils Relative %: 32 %
PLATELETS: 361 10*3/uL (ref 150–400)
RBC: 3.19 MIL/uL — ABNORMAL LOW (ref 4.22–5.81)
RDW: 16.7 % — ABNORMAL HIGH (ref 11.5–15.5)
WBC: 7.7 10*3/uL (ref 4.0–10.5)

## 2016-08-09 LAB — PROTIME-INR
INR: 1.93
PROTHROMBIN TIME: 22.4 s — AB (ref 11.4–15.2)

## 2016-08-09 MED ORDER — WARFARIN SODIUM 2.5 MG PO TABS
2.5000 mg | ORAL_TABLET | Freq: Once | ORAL | Status: AC
  Administered 2016-08-09: 2.5 mg via ORAL
  Filled 2016-08-09: qty 1

## 2016-08-09 MED ORDER — ACETAMINOPHEN 325 MG PO TABS: 650.0000 mg | ORAL_TABLET | ORAL | Status: DC | PRN

## 2016-08-09 MED ORDER — SODIUM CHLORIDE 0.9 % IV SOLN: 250.0000 mL | INTRAVENOUS | Status: DC | PRN

## 2016-08-09 MED ORDER — FINASTERIDE 5 MG PO TABS
5.0000 mg | ORAL_TABLET | Freq: Every day | ORAL | Status: DC
  Administered 2016-08-09 – 2016-08-12 (×5): 5 mg via ORAL
  Filled 2016-08-09 (×5): qty 1

## 2016-08-09 MED ORDER — SODIUM CHLORIDE 0.9% FLUSH
3.0000 mL | Freq: Two times a day (BID) | INTRAVENOUS | Status: DC
  Administered 2016-08-09 – 2016-08-13 (×8): 3 mL via INTRAVENOUS

## 2016-08-09 MED ORDER — SERTRALINE HCL 50 MG PO TABS
50.0000 mg | ORAL_TABLET | Freq: Every day | ORAL | Status: DC
  Administered 2016-08-09 – 2016-08-13 (×5): 50 mg via ORAL
  Filled 2016-08-09 (×5): qty 1

## 2016-08-09 MED ORDER — POTASSIUM CHLORIDE CRYS ER 20 MEQ PO TBCR
20.0000 meq | EXTENDED_RELEASE_TABLET | Freq: Every day | ORAL | Status: DC
  Administered 2016-08-09 – 2016-08-13 (×5): 20 meq via ORAL
  Filled 2016-08-09 (×5): qty 1

## 2016-08-09 MED ORDER — ROSUVASTATIN CALCIUM 20 MG PO TABS
20.0000 mg | ORAL_TABLET | Freq: Every day | ORAL | Status: DC
  Administered 2016-08-09 – 2016-08-12 (×5): 20 mg via ORAL
  Filled 2016-08-09 (×5): qty 1

## 2016-08-09 MED ORDER — SODIUM CHLORIDE 0.9% FLUSH: 3.0000 mL | INTRAVENOUS | Status: DC | PRN

## 2016-08-09 MED ORDER — POTASSIUM CHLORIDE CRYS ER 20 MEQ PO TBCR
40.0000 meq | EXTENDED_RELEASE_TABLET | ORAL | Status: AC
  Administered 2016-08-09: 40 meq via ORAL
  Filled 2016-08-09: qty 2

## 2016-08-09 MED ORDER — ONDANSETRON HCL 4 MG/2ML IJ SOLN: 4.0000 mg | Freq: Four times a day (QID) | INTRAMUSCULAR | Status: DC | PRN

## 2016-08-09 MED ORDER — TEMAZEPAM 15 MG PO CAPS
30.0000 mg | ORAL_CAPSULE | Freq: Every day | ORAL | Status: DC
  Administered 2016-08-09 – 2016-08-12 (×5): 30 mg via ORAL
  Filled 2016-08-09 (×5): qty 2

## 2016-08-09 MED ORDER — BACITRACIN-NEOMYCIN-POLYMYXIN OINTMENT TUBE
1.0000 | TOPICAL_OINTMENT | Freq: Every day | CUTANEOUS | Status: DC | PRN
Start: 2016-08-08 — End: 2016-08-13
  Filled 2016-08-09: qty 28

## 2016-08-09 MED ORDER — SENNOSIDES-DOCUSATE SODIUM 8.6-50 MG PO TABS
1.0000 | ORAL_TABLET | Freq: Every day | ORAL | Status: DC
  Administered 2016-08-09 – 2016-08-12 (×4): 1 via ORAL
  Filled 2016-08-09 (×4): qty 1

## 2016-08-09 MED ORDER — FUROSEMIDE 10 MG/ML IJ SOLN
40.0000 mg | Freq: Two times a day (BID) | INTRAMUSCULAR | Status: DC
  Administered 2016-08-09 – 2016-08-13 (×9): 40 mg via INTRAVENOUS
  Filled 2016-08-09 (×9): qty 4

## 2016-08-09 MED ORDER — NEBIVOLOL HCL 5 MG PO TABS
5.0000 mg | ORAL_TABLET | Freq: Every day | ORAL | Status: DC
  Administered 2016-08-09 – 2016-08-13 (×4): 5 mg via ORAL
  Filled 2016-08-09 (×5): qty 1

## 2016-08-09 MED ORDER — WARFARIN - PHARMACIST DOSING INPATIENT: Freq: Every day | Status: DC

## 2016-08-09 MED ORDER — IPRATROPIUM-ALBUTEROL 0.5-2.5 (3) MG/3ML IN SOLN: 3.0000 mL | RESPIRATORY_TRACT | Status: DC | PRN

## 2016-08-09 MED ORDER — SORBITOL 70 % SOLN
30.0000 mL | Freq: Once | Status: AC
  Administered 2016-08-09: 30 mL via ORAL
  Filled 2016-08-09: qty 30

## 2016-08-09 MED ORDER — TAMSULOSIN HCL 0.4 MG PO CAPS
0.4000 mg | ORAL_CAPSULE | Freq: Every day | ORAL | Status: DC
  Administered 2016-08-09 – 2016-08-13 (×5): 0.4 mg via ORAL
  Filled 2016-08-09 (×5): qty 1

## 2016-08-09 MED ORDER — NITROGLYCERIN 0.4 MG SL SUBL: 0.4000 mg | SUBLINGUAL_TABLET | SUBLINGUAL | Status: DC | PRN

## 2016-08-09 MED ORDER — SOTALOL HCL 80 MG PO TABS
80.0000 mg | ORAL_TABLET | Freq: Two times a day (BID) | ORAL | Status: DC
  Administered 2016-08-09 – 2016-08-13 (×8): 80 mg via ORAL
  Filled 2016-08-09 (×9): qty 1

## 2016-08-09 NOTE — Progress Notes (Signed)
PROGRESS NOTE    Steve Norris  NFA:[id number] DOB: 12/30/1929 DOA: 08/08/2016 PCP: Horatio Pel, MD    Brief Narrative:  83 y.o. male with medical history significant of HTN, HLD, COPD, A. fib, SSS s/p PM; who presents with worsening bilateral lower extremity edema and shortness of breath with exertion over the last week. Patient reports taking his prescription for 20 mg of Lasix daily. At baseline patient lives at home alone, but has aids that check on him routinely. Patient complains of being easily winded and short of breath walking any significant distance. He does not routinely weigh himself and reports that he has been sleeping in a recliner for quite some time. Denies any chest pain, cough, diarrhea, nausea, vomiting, abdominal pain, dysuria, blood in stool/urine, or falls. The patient's son is present at bedside and helps provide additional history. He thinks that his father supposedly on inhalers, but has not had them filled in quite some time.  Followed by Dr. Sanda Klein of cardiology. Patient last remote pacemaker check was on 06/24/2016.  ED Course: Upon admission to the emergency department patient was seen to be afebrile, pulse 62-68, respirations 17-19, blood pressures maintained, with 2 saturations as low as 85% with ambulation. Labs revealed WBC 7.9, hemoglobin 9.4, BNP 559.5 and all other labs relatively within normal limits. Chest x-ray shows signs of mild interstitial edema. Patient was given 40 mg of Lasix IV in the ED.  Assessment & Plan:   Principal Problem:   CHF exacerbation ([place]) Active Problems:   COPD (chronic obstructive pulmonary disease) ([facility])   S/P placement of cardiac pacemaker   Long term current use of anticoagulant therapy   Atrial fibrillation with rapid ventricular response ([facility])   Pressure injury of skin   BPH (benign prostatic hyperplasia)   HTN (hypertension)  CHF exacerbation: Acute. Patient presents with bilateral lower extremity  edema and shortness of breath. Chest x-ray shows signs of interstitial edema. BNP elevated at 559.5. Last echocardiogram on 07/2015 with EF noted to be 55-60%. Patient was given 40 mg of Lasix IV in the emergency department with good diuresis. - Continue strict I&Os and daily weights - Elevate lower extremities - Continue Lasix 40 mg IV Bid - Check echocardiogram - normal LVEF with grade 2 diastolic dysfunction  Atrial fibrillation with H/O SSS s/p PM on chronic anticoagulation: PM last remotely interrogated on 2/27 - Continue sotalol and Coumadin per pharmacy - Stable at present  Essential hypertension - Continue by systolic as tolerated  COPD with mild exacerbation: Acute.  - DuoNeb prn shortness of breath/wheezing -Currently stable  Hyperlipidemia - Continue Crestor as tolerated  BPH - Continue Flomax and Proscar   DVT prophylaxis: coumadin Code Status: Full Family Communication: Pt in room, family at bedside Disposition Plan: uncertain at this time  Consultants:     Procedures:     Antimicrobials: Anti-infectives    None       Subjective: No complaints  Objective: Vitals:   08/09/16 0645 08/09/16 0907 08/09/16 1058 08/09/16 1328  BP: 113/64 (!) 105/52  (!) 104/49  Pulse: 89 71  66  Resp: 16   18  Temp: 97.7 F (36.5 C)   97.7 F (36.5 C)  TempSrc: Oral   Oral  SpO2: 99% 93%  95%  Weight:   72.6 kg (160 lb 1.6 oz)   Height:        Intake/Output Summary (Last 24 hours) at 08/09/16 1836 Last data filed at 08/09/16 1600  Gross per 24  hour  Intake                0 ml  Output             1850 ml  Net            -1850 ml   Filed Weights   08/09/16 0032 08/09/16 1058  Weight: 76.3 kg (168 lb 3.4 oz) 72.6 kg (160 lb 1.6 oz)    Examination:  General exam: Appears calm and comfortable  Respiratory system: Clear to auscultation. Respiratory effort normal. Cardiovascular system: S1 & S2 heard, RRR. No JVD, murmurs, rubs, gallops or clicks.    Gastrointestinal system: Abdomen is nondistended, soft and nontender. No organomegaly or masses felt. Normal bowel sounds heard. Central nervous system: Alert and oriented. No focal neurological deficits. Extremities: Symmetric 5 x 5 power, b LE edema Skin: No rashes, lesions or ulcers Psychiatry: Judgement and insight appear normal. Mood & affect appropriate.   Data Reviewed: I have personally reviewed following labs and imaging studies  CBC:  Recent Labs Lab 08/08/16 1935 08/09/16 0530  WBC 7.9 7.7  NEUTROABS 3.1 2.5  HGB 9.4* 9.3*  HCT 28.9* 29.0*  MCV 92.9 90.9  PLT 341 950   Basic Metabolic Panel:  Recent Labs Lab 08/08/16 1935 08/09/16 0530  NA 138 138  K 3.8 3.3*  CL 104 99*  CO2 29 34*  GLUCOSE 99 94  BUN 18 15  CREATININE 0.53* 0.53*  CALCIUM 8.3* 8.4*   GFR: Estimated Creatinine Clearance: 67.5 mL/min (A) (by C-G formula based on SCr of 0.53 mg/dL (L)). Liver Function Tests: No results for input(s): AST, ALT, ALKPHOS, BILITOT, PROT, ALBUMIN in the last 168 hours. No results for input(s): LIPASE, AMYLASE in the last 168 hours. No results for input(s): AMMONIA in the last 168 hours. Coagulation Profile:  Recent Labs Lab 08/08/16 1935 08/09/16 0735  INR 2.16 1.93   Cardiac Enzymes: No results for input(s): CKTOTAL, CKMB, CKMBINDEX, TROPONINI in the last 168 hours. BNP (last 3 results) No results for input(s): PROBNP in the last 8760 hours. HbA1C: No results for input(s): HGBA1C in the last 72 hours. CBG: No results for input(s): GLUCAP in the last 168 hours. Lipid Profile: No results for input(s): CHOL, HDL, LDLCALC, TRIG, CHOLHDL, LDLDIRECT in the last 72 hours. Thyroid Function Tests: No results for input(s): TSH, T4TOTAL, FREET4, T3FREE, THYROIDAB in the last 72 hours. Anemia Panel: No results for input(s): VITAMINB12, FOLATE, FERRITIN, TIBC, IRON, RETICCTPCT in the last 72 hours. Sepsis Labs: No results for input(s): PROCALCITON,  LATICACIDVEN in the last 168 hours.  No results found for this or any previous visit (from the past 240 hour(s)).   Radiology Studies: Dg Chest 2 View  Result Date: 08/08/2016 CLINICAL DATA:  83 year old male with worsening generalized edema. Initial encounter. EXAM: CHEST  2 VIEW COMPARISON:  Chest radiographs 03/12/2016 and earlier. FINDINGS: Semi upright AP and lateral views of the chest. Mildly increased basilar predominant pulmonary interstitial markings compared to 2017. Stable lung volumes. Stable mild cardiomegaly. Stable left chest stool lead cardiac pacemaker. Calcified aortic atherosclerosis. Other mediastinal contours are within normal limits. No pneumothorax. No pleural effusion. No confluent pulmonary opacity. Osteopenia. No acute osseous abnormality identified. Negative visible bowel gas pattern. IMPRESSION: 1. Basilar predominant increased pulmonary vascularity compatible with mild or developing interstitial edema. No pleural effusion. 2. Calcified aortic atherosclerosis. Electronically Signed   By: Genevie Ann M.D.   On: 08/08/2016 19:40    Scheduled Meds: .  finasteride  5 mg Oral QHS  . furosemide  40 mg Intravenous BID  . ipratropium-albuterol  3 mL Nebulization STAT  . nebivolol  5 mg Oral Daily  . potassium chloride SA  20 mEq Oral Daily  . rosuvastatin  20 mg Oral q1800  . senna-docusate  1 tablet Oral QHS  . sertraline  50 mg Oral Daily  . sodium chloride flush  3 mL Intravenous Q12H  . sorbitol  30 mL Oral ONCE-1800  . sotalol  80 mg Oral Q12H  . tamsulosin  0.4 mg Oral Daily  . temazepam  30 mg Oral QHS  . Warfarin - Pharmacist Dosing Inpatient   Does not apply q1800   Continuous Infusions:   LOS: 1 day   CHIU, Orpah Melter, MD [facility] Pager [phone]  If 7PM-7AM, please contact night-coverage [email or web address] Password [facility] 08/09/2016, 6:36 PM

## 2016-08-09 NOTE — Progress Notes (Signed)
ANTICOAGULATION CONSULT NOTE - Follow Up Consult  Pharmacy Consult for Warfarin Indication: atrial fibrillation  Allergies  Allergen Reactions  . Amiodarone Nausea And Vomiting  . Penicillins Hives, Swelling and Rash    Patient Measurements: Height: 5\' 9"  (175.3 cm) Weight: 168 lb 3.4 oz (76.3 kg) IBW/kg (Calculated) : 70.7 Heparin Dosing Weight:   Vital Signs: Temp: 97.7 F (36.5 C) (04/14 0645) Temp Source: Oral (04/14 0645) BP: 113/64 (04/14 0645) Pulse Rate: 89 (04/14 0645)  Labs:  Recent Labs  08/08/16 1935 08/09/16 0530  HGB 9.4* 9.3*  HCT 28.9* 29.0*  PLT 341 361  LABPROT 24.5*  --   INR 2.16  --   CREATININE 0.53* 0.53*    Estimated Creatinine Clearance: 67.5 mL/min (A) (by C-G formula based on SCr of 0.53 mg/dL (L)).   Medications:  Prescriptions Prior to Admission  Medication Sig Dispense Refill Last Dose  . finasteride (PROSCAR) 5 MG tablet Take 5 mg by mouth at bedtime.    08/07/2016 at Unknown time  . furosemide (LASIX) 20 MG tablet Take 1 tablet (20 mg total) by mouth daily. 30 tablet 6 08/07/2016 at Unknown time  . nebivolol (BYSTOLIC) 5 MG tablet Take 1 tablet (5 mg total) by mouth daily. 90 tablet 2 08/07/2016 at Unknown time  . rosuvastatin (CRESTOR) 20 MG tablet 20 mg daily.    08/07/2016 at Unknown time  . senna-docusate (SENNA-S) 8.6-50 MG per tablet Take 1 tablet by mouth at bedtime.   08/07/2016 at Unknown time  . sertraline (ZOLOFT) 50 MG tablet Take 1 tablet by mouth daily.   08/07/2016 at Unknown time  . sotalol (BETAPACE) 80 MG tablet take 1 tablet by mouth every 12 hours 60 tablet 5 08/07/2016 at Unknown time  . tamsulosin (FLOMAX) 0.4 MG CAPS capsule Take 1 capsule by mouth daily.   08/07/2016 at Unknown time  . temazepam (RESTORIL) 30 MG capsule Take 30 mg by mouth at bedtime.   08/07/2016 at Unknown time  . warfarin (COUMADIN) 5 MG tablet take 1 tablet by mouth once daily (OR AS DIRECRED BY COUMADIN CLINIC) (Patient taking differently: Take  0.5-1 tablet by mouth daily as directed) 90 tablet 1 08/07/2016 at 1800  . divalproex (DEPAKOTE ER) 500 MG 24 hr tablet Take 1 tablet (500 mg total) by mouth daily. (Patient not taking: Reported on 08/08/2016) 90 tablet 2 Not Taking at Unknown time  . neomycin-bacitracin-polymyxin (NEOSPORIN) OINT Apply 1 application topically daily as needed for wound care. Reported on 08/31/2015   unknown  . nitroGLYCERIN (NITROSTAT) 0.4 MG SL tablet Place 0.4 mg under the tongue every 5 (five) minutes as needed for chest pain.    unknown  . potassium chloride SA (K-DUR,KLOR-CON) 20 MEQ tablet take 1 tablet by mouth once daily 30 tablet 6 unknown   Scheduled:  . finasteride  5 mg Oral QHS  . furosemide  40 mg Intravenous BID  . ipratropium-albuterol  3 mL Nebulization STAT  . nebivolol  5 mg Oral Daily  . potassium chloride SA  20 mEq Oral Daily  . rosuvastatin  20 mg Oral q1800  . senna-docusate  1 tablet Oral QHS  . sertraline  50 mg Oral Daily  . sodium chloride flush  3 mL Intravenous Q12H  . sotalol  80 mg Oral Q12H  . tamsulosin  0.4 mg Oral Daily  . temazepam  30 mg Oral QHS    Assessment: Patient with afib and chronic warfarin use.  INR on admit 2.16; at goal.  Goal of Therapy:  INR 2-3    Plan:  Warfarin 2.5mg  po x1 at 1800 Daily INR.  Steve Norris, Steve Norris 08/09/2016,6:52 AM

## 2016-08-09 NOTE — Progress Notes (Signed)
  Echocardiogram 2D Echocardiogram has been performed.  Donata Clay 08/09/2016, 1:12 PM

## 2016-08-09 NOTE — Evaluation (Signed)
Physical Therapy Evaluation Patient Details Name: Steve Norris MRN: [id number] DOB: 07-01-1929 Today's Date: 08/09/2016   History of Present Illness  Steve Norris is a 83 y.o. male with medical history significant of HTN, HLD, COPD, A. fib, SSS s/p PM; who presents with worsening bilateral lower extremity edema and shortness of breath with exertion over the last week  Clinical Impression  Pt admitted with above diagnosis. Pt currently with functional limitations due to the deficits listed below (see PT Problem List).  Pt will benefit from skilled PT to increase their independence and safety with mobility to allow discharge to the venue listed below.   Recommend HHPT at D/C--at least safety eval; pt appears to have some STM deficits, has assist with meals and meds per son;  O2 sats 98% on RA with mild dyspnea  will continue to follow     Follow Up Recommendations Home health PT    Equipment Recommendations  None recommended by PT    Recommendations for Other Services       Precautions / Restrictions Precautions Precautions: Fall      Mobility  Bed Mobility Overal bed mobility: Needs Assistance Bed Mobility: Supine to Sit;Sit to Supine     Supine to sit: Min guard Sit to supine: Min assist   General bed mobility comments: assist with LEs onto bed; pt requests back to bed d/t fatigue  Transfers Overall transfer level: Needs assistance Equipment used: None Transfers: Sit to/from Stand Sit to Stand: Min guard            Ambulation/Gait Ambulation/Gait assistance: Min assist Ambulation Distance (Feet): 120 Feet Assistive device: None Gait Pattern/deviations: Step-through pattern;Decreased stride length     General Gait Details: no overt LOB but mild drifting, decr reaction time to min challenges and light assist with balance during turns  Science writer    Modified Rankin (Stroke Patients Only)       Balance Overall balance  assessment: Needs assistance (denies falls) Sitting-balance support: No upper extremity supported;Feet supported Sitting balance-Leahy Scale: Good       Standing balance-Leahy Scale: Fair Standing balance comment: fair to good             High level balance activites: Backward walking;Direction changes;Turns               Pertinent Vitals/Pain Pain Assessment: No/denies pain    Home Living Family/patient expects to be discharged to:: Private residence Living Arrangements: Alone Available Help at Discharge: Available PRN/intermittently (aides 2-3 x/day) Type of Home: House Home Access: Level entry     Home Layout: One level Home Equipment: Environmental consultant - 2 wheels;Bedside commode Additional Comments: pt son present and confirms above info; pt oriented x3 but denies having any assist at home for meds/cleaning etc. (unsure if true denial vs decr memory)    Prior Function Level of Independence: Independent               Hand Dominance        Extremity/Trunk Assessment   Upper Extremity Assessment Upper Extremity Assessment: Defer to OT evaluation;Overall [facility] for tasks assessed    Lower Extremity Assessment Lower Extremity Assessment: Defer to PT evaluation;Overall WFL for tasks assessed       Communication   Communication: No difficulties  Cognition Arousal/Alertness: Awake/alert Behavior During Therapy: WFL for tasks assessed/performed Overall Cognitive Status: Impaired/Different from baseline Area of Impairment: Memory  Memory: Decreased short-term memory                General Comments      Exercises     Assessment/Plan    PT Assessment Patient needs continued PT services  PT Problem List Decreased activity tolerance;Decreased balance;Decreased mobility       PT Treatment Interventions DME instruction;Gait training;Functional mobility training;Therapeutic activities;Therapeutic exercise;Patient/family  education    PT Goals (Current goals can be found in the Care Plan section)  Acute Rehab PT Goals Patient Stated Goal: home asap PT Goal Formulation: With patient Time For Goal Achievement: 08/16/16 Potential to Achieve Goals: Good    Frequency Min 3X/week   Barriers to discharge        Co-evaluation               End of Session Equipment Utilized During Treatment: Gait belt Activity Tolerance: Patient tolerated treatment well Patient left: in bed;with call bell/phone within reach;with family/visitor present;with bed alarm set   PT Visit Diagnosis: Unsteadiness on feet (R26.81)    Time: 8295-6213 PT Time Calculation (min) (ACUTE ONLY): 20 min   Charges:   PT Evaluation $PT Eval Low Complexity: 1 Procedure     PT G CodesKenyon Ana, PT Pager: [phone] 08/09/2016   [facility] 08/09/2016, 1:30 PM

## 2016-08-10 DIAGNOSIS — R0609 Other forms of dyspnea: Secondary | ICD-10-CM

## 2016-08-10 LAB — BASIC METABOLIC PANEL
Anion gap: 8 (ref 5–15)
BUN: 18 mg/dL (ref 6–20)
CHLORIDE: 95 mmol/L — AB (ref 101–111)
CO2: 33 mmol/L — AB (ref 22–32)
Calcium: 8.6 mg/dL — ABNORMAL LOW (ref 8.9–10.3)
Creatinine, Ser: 0.62 mg/dL (ref 0.61–1.24)
GFR calc Af Amer: >60 mL/min (ref >60)
GFR calc non Af Amer: >60 mL/min (ref >60)
GLUCOSE: 92 mg/dL (ref 65–99)
POTASSIUM: 3.5 mmol/L (ref 3.5–5.1)
Sodium: 136 mmol/L (ref 135–145)

## 2016-08-10 LAB — PROTIME-INR
INR: 1.74
Prothrombin Time: 20.5 seconds — ABNORMAL HIGH (ref 11.4–15.2)

## 2016-08-10 MED ORDER — GERHARDT'S BUTT CREAM
TOPICAL_CREAM | Freq: Four times a day (QID) | CUTANEOUS | Status: DC
  Administered 2016-08-10 – 2016-08-13 (×11): via TOPICAL
  Filled 2016-08-10: qty 1

## 2016-08-10 MED ORDER — WARFARIN SODIUM 5 MG PO TABS
5.0000 mg | ORAL_TABLET | Freq: Once | ORAL | Status: AC
  Administered 2016-08-10: 5 mg via ORAL
  Filled 2016-08-10: qty 1

## 2016-08-10 NOTE — Consult Note (Signed)
[facility] Nurse wound consult note Reason for Consult: Full thickness wound on left buttock, moisture, not pressure Wound type:moisture associated skin damage Pressure Injury POA: No Measurement: 0.6cm x 0.4cm with depth obscured by the presence of yellow slough. Wound bed: As described above Drainage (amount, consistency, odor) none Periwound:intact, mild periwound erythema measuring 0.4cm circumferentially Dressing procedure/placement/frequency: Patient reports that he likes to lie on his side, but does not here in the hospital. He is reminded that this is an essential element in the POC.  I will provide a therapeutic cream containing zinc oxide, a hydrocortisone and an antifungal for application 4 times daily, and a pressure redistribution chair cushion for his use when OOB (he may take this home).  [facility] nursing team will not follow, but will remain available to this patient, the nursing and medical teams.  Please re-consult if needed. Thanks, Maudie Flakes, MSN, RN, [place], Arther Abbott  Pager# [phone]

## 2016-08-10 NOTE — Progress Notes (Signed)
ANTICOAGULATION CONSULT NOTE - Follow Up Consult  Pharmacy Consult for warfarin Indication: hx atrial fibrillation  Allergies  Allergen Reactions  . Amiodarone Nausea And Vomiting  . Penicillins Hives, Swelling and Rash    Patient Measurements: Height: 5\' 9"  (175.3 cm) Weight: 160 lb 1.6 oz (72.6 kg) IBW/kg (Calculated) : 70.7 Heparin Dosing Weight:   Vital Signs: Temp: 97.6 F (36.4 C) (04/15 0354) Temp Source: Oral (04/15 0354) BP: 106/58 (04/15 0354) Pulse Rate: 64 (04/15 0354)  Labs:  Recent Labs  08/08/16 1935 08/09/16 0530 08/09/16 0735 08/10/16 0520  HGB 9.4* 9.3*  --   --   HCT 28.9* 29.0*  --   --   PLT 341 361  --   --   LABPROT 24.5*  --  22.4* 20.5*  INR 2.16  --  1.93 1.74  CREATININE 0.53* 0.53*  --  0.62    Estimated Creatinine Clearance: 67.5 mL/min (by C-G formula based on SCr of 0.62 mg/dL).   Medications:  Home warfarin regimen: 2.5 mg daily except 5 mg on TTSun (last dose on 4/12 PTA)  Assessment: Patient's an 83 y.o M with hx heart failure and Afib on warfarin PTA, presented to the ED on 08/08/16 with c/o leg swelling and SOB.  Warfarin resumed on admission.  Today, 08/10/2016: - INR subtherapeutic at 1.74 (but looks like he missed his dose on 4/13) - cbc relatively stable with last labs on 4/14 - no bleeding documented - on regular diet - no signif. Drug-drug intxns   Goal of Therapy:  INR 2-3 Monitor platelets by anticoagulation protocol: Yes   Plan:  - warfarin 5 mg PI x1 today - daily INR - monitor for s/s bleeding  Steve Norris P 08/10/2016,9:10 AM

## 2016-08-10 NOTE — Progress Notes (Signed)
PROGRESS NOTE    Steve Norris  WNU:[id number] DOB: 1930/02/15 DOA: 08/08/2016 PCP: Horatio Pel, MD    Brief Narrative:  83 y.o. male with medical history significant of HTN, HLD, COPD, A. fib, SSS s/p PM; who presents with worsening bilateral lower extremity edema and shortness of breath with exertion over the last week. Patient reports taking his prescription for 20 mg of Lasix daily. At baseline patient lives at home alone, but has aids that check on him routinely. Patient complains of being easily winded and short of breath walking any significant distance. He does not routinely weigh himself and reports that he has been sleeping in a recliner for quite some time. Denies any chest pain, cough, diarrhea, nausea, vomiting, abdominal pain, dysuria, blood in stool/urine, or falls. The patient's son is present at bedside and helps provide additional history. He thinks that his father supposedly on inhalers, but has not had them filled in quite some time.  Followed by Dr. Sanda Klein of cardiology. Patient last remote pacemaker check was on 06/24/2016.  ED Course: Upon admission to the emergency department patient was seen to be afebrile, pulse 62-68, respirations 17-19, blood pressures maintained, with 2 saturations as low as 85% with ambulation. Labs revealed WBC 7.9, hemoglobin 9.4, BNP 559.5 and all other labs relatively within normal limits. Chest x-ray shows signs of mild interstitial edema. Patient was given 40 mg of Lasix IV in the ED.  Assessment & Plan:   Principal Problem:   CHF exacerbation ([place]) Active Problems:   COPD (chronic obstructive pulmonary disease) (HCC)   S/P placement of cardiac pacemaker   Long term current use of anticoagulant therapy   Atrial fibrillation with rapid ventricular response (HCC)   Pressure injury of skin   BPH (benign prostatic hyperplasia)   HTN (hypertension)  CHF exacerbation: Acute. Patient presents with bilateral lower extremity  edema and shortness of breath. Chest x-ray shows signs of interstitial edema. BNP elevated at 559.5. Last echocardiogram on 07/2015 with EF noted to be 55-60%. Patient was given 40 mg of Lasix IV in the emergency department with good diuresis. - Continue strict I&Os and daily weights - Elevate lower extremities - Patient is continued on Lasix 40 mg IV Bid - Check echocardiogram - normal LVEF with grade 2 diastolic dysfunction  Atrial fibrillation with H/O SSS s/p PM on chronic anticoagulation: PM last remotely interrogated on 2/27 - Continue sotalol and Coumadin per pharmacy - Remained stable at present  Essential hypertension - Mildly hypotensive overnight -We'll place hold parameters on by stolid  COPD with mild exacerbation: Acute.  - DuoNeb prn shortness of breath/wheezing - Remains stable at present  Hyperlipidemia - Plan to continue Crestor as tolerated  BPH - Continue Flomax and Proscar  - Patient noted to have bladder outlet obstruction on 08/09/2016 status post in and out cath. -Stable at present. If recurrence, and consider indwelling Foley catheter  DVT prophylaxis: coumadin Code Status: Full Family Communication: Pt in room, family at bedside Disposition Plan: uncertain at this time  Consultants:     Procedures:     Antimicrobials: Anti-infectives    None      Subjective: Without complaints this morning  Objective: Vitals:   08/09/16 1328 08/09/16 2028 08/09/16 2113 08/10/16 0354  BP: (!) 104/49 (!) 87/44 110/70 (!) 106/58  Pulse: 66 62  64  Resp: 18 18  16   Temp: 97.7 F (36.5 C) 97.3 F (36.3 C)  97.6 F (36.4 C)  TempSrc: Oral Oral  Oral  SpO2: 95% 97%  92%  Weight:      Height:        Intake/Output Summary (Last 24 hours) at 08/10/16 1359 Last data filed at 08/10/16 0341  Gross per 24 hour  Intake              780 ml  Output              510 ml  Net              270 ml   Filed Weights   08/09/16 0032 08/09/16 1058  Weight:  76.3 kg (168 lb 3.4 oz) 72.6 kg (160 lb 1.6 oz)    Examination:  General exam: Lying in bed, conversant, no acute distress Respiratory system:  normal respiratory effort, no audible wheezing Cardiovascular system:  regular rate, S1-S2 Gastrointestinal system:  soft, positive bowel sounds, nondistended Central nervous system:  cranial nerves II through XII grossly intact, sensation intact Extremities:  perfused, no clubbing Skin:  normal skin turgor, notable skin lesions seen Psychiatry:  mood normal, no visual hallucinations  Data Reviewed: I have personally reviewed following labs and imaging studies  CBC:  Recent Labs Lab 08/08/16 1935 08/09/16 0530  WBC 7.9 7.7  NEUTROABS 3.1 2.5  HGB 9.4* 9.3*  HCT 28.9* 29.0*  MCV 92.9 90.9  PLT 341 161   Basic Metabolic Panel:  Recent Labs Lab 08/08/16 1935 08/09/16 0530 08/10/16 0520  NA 138 138 136  K 3.8 3.3* 3.5  CL 104 99* 95*  CO2 29 34* 33*  GLUCOSE 99 94 92  BUN 18 15 18   CREATININE 0.53* 0.53* 0.62  CALCIUM 8.3* 8.4* 8.6*   GFR: Estimated Creatinine Clearance: 67.5 mL/min (by C-G formula based on SCr of 0.62 mg/dL). Liver Function Tests: No results for input(s): AST, ALT, ALKPHOS, BILITOT, PROT, ALBUMIN in the last 168 hours. No results for input(s): LIPASE, AMYLASE in the last 168 hours. No results for input(s): AMMONIA in the last 168 hours. Coagulation Profile:  Recent Labs Lab 08/08/16 1935 08/09/16 0735 08/10/16 0520  INR 2.16 1.93 1.74   Cardiac Enzymes: No results for input(s): CKTOTAL, CKMB, CKMBINDEX, TROPONINI in the last 168 hours. BNP (last 3 results) No results for input(s): PROBNP in the last 8760 hours. HbA1C: No results for input(s): HGBA1C in the last 72 hours. CBG: No results for input(s): GLUCAP in the last 168 hours. Lipid Profile: No results for input(s): CHOL, HDL, LDLCALC, TRIG, CHOLHDL, LDLDIRECT in the last 72 hours. Thyroid Function Tests: No results for input(s): TSH,  T4TOTAL, FREET4, T3FREE, THYROIDAB in the last 72 hours. Anemia Panel: No results for input(s): VITAMINB12, FOLATE, FERRITIN, TIBC, IRON, RETICCTPCT in the last 72 hours. Sepsis Labs: No results for input(s): PROCALCITON, LATICACIDVEN in the last 168 hours.  No results found for this or any previous visit (from the past 240 hour(s)).   Radiology Studies: Dg Chest 2 View  Result Date: 08/08/2016 CLINICAL DATA:  83 year old male with worsening generalized edema. Initial encounter. EXAM: CHEST  2 VIEW COMPARISON:  Chest radiographs 03/12/2016 and earlier. FINDINGS: Semi upright AP and lateral views of the chest. Mildly increased basilar predominant pulmonary interstitial markings compared to 2017. Stable lung volumes. Stable mild cardiomegaly. Stable left chest stool lead cardiac pacemaker. Calcified aortic atherosclerosis. Other mediastinal contours are within normal limits. No pneumothorax. No pleural effusion. No confluent pulmonary opacity. Osteopenia. No acute osseous abnormality identified. Negative visible bowel gas pattern. IMPRESSION: 1. Basilar predominant increased pulmonary vascularity  compatible with mild or developing interstitial edema. No pleural effusion. 2. Calcified aortic atherosclerosis. Electronically Signed   By: Genevie Ann M.D.   On: 08/08/2016 19:40    Scheduled Meds: . finasteride  5 mg Oral QHS  . furosemide  40 mg Intravenous BID  . nebivolol  5 mg Oral Daily  . potassium chloride SA  20 mEq Oral Daily  . rosuvastatin  20 mg Oral q1800  . senna-docusate  1 tablet Oral QHS  . sertraline  50 mg Oral Daily  . sodium chloride flush  3 mL Intravenous Q12H  . sotalol  80 mg Oral Q12H  . tamsulosin  0.4 mg Oral Daily  . temazepam  30 mg Oral QHS  . warfarin  5 mg Oral ONCE-1800  . Warfarin - Pharmacist Dosing Inpatient   Does not apply q1800   Continuous Infusions:   LOS: 2 days   Orianna Biskup, Orpah Melter, MD [facility] Pager [phone]  If 7PM-7AM, please contact  night-coverage [email or web address] Password [facility] 08/10/2016, 1:59 PM

## 2016-08-10 NOTE — Progress Notes (Signed)
Pt only had OP of 110 mls despite IP of 780 mls. Bladder scan showed 32 mls. On call notified. BMET ordered.

## 2016-08-11 LAB — PROTIME-INR
INR: 1.73
Prothrombin Time: 20.5 seconds — ABNORMAL HIGH (ref 11.4–15.2)

## 2016-08-11 LAB — BASIC METABOLIC PANEL
Anion gap: 7 (ref 5–15)
BUN: 23 mg/dL — AB (ref 6–20)
CHLORIDE: 99 mmol/L — AB (ref 101–111)
CO2: 30 mmol/L (ref 22–32)
CREATININE: 0.63 mg/dL (ref 0.61–1.24)
Calcium: 8.4 mg/dL — ABNORMAL LOW (ref 8.9–10.3)
GFR calc Af Amer: >60 mL/min (ref >60)
GFR calc non Af Amer: >60 mL/min (ref >60)
GLUCOSE: 98 mg/dL (ref 65–99)
Potassium: 3.8 mmol/L (ref 3.5–5.1)
Sodium: 136 mmol/L (ref 135–145)

## 2016-08-11 MED ORDER — WARFARIN SODIUM 5 MG PO TABS
5.0000 mg | ORAL_TABLET | Freq: Once | ORAL | Status: AC
  Administered 2016-08-11: 5 mg via ORAL
  Filled 2016-08-11: qty 1

## 2016-08-11 NOTE — NC FL2 (Signed)
[place] LEVEL OF CARE SCREENING TOOL     IDENTIFICATION  Patient Name: Steve Norris Birthdate: 1929/08/01 Sex: male Admission Date (Current Location): 08/08/2016  [facility] and [place] Number:  Herbalist and Address:  [facility], [place] [address] [place], [place] [address]      Provider Number: [id number]  Attending Physician Name and Address:  Donne Hazel, MD  Relative Name and Phone Number:       Current Level of Care: Hospital Recommended Level of Care: [place] Prior Approval Number:    Date Approved/Denied:   PASRR Number: [id number]  Discharge Plan: SNF    Current Diagnoses: Patient Active Problem List   Diagnosis Date Noted  . Pressure injury of skin 08/09/2016  . BPH (benign prostatic hyperplasia) 08/09/2016  . HTN (hypertension) 08/09/2016  . CHF exacerbation ([place]) 08/08/2016  . Anemia 03/25/2016  . SSS (sick sinus syndrome) ([place]) 03/25/2016  . CAP (community acquired pneumonia)   . Atrial fibrillation with rapid ventricular response ([place]) 11/04/2014  . Dyspnea 12/15/2012  . Long term current use of anticoagulant therapy 07/14/2012  . COPD (chronic obstructive pulmonary disease) ([place]) 02/23/2012  . CAD (coronary artery disease) 02/23/2012  . S/P placement of cardiac pacemaker 02/23/2012  . Atrial fibrillation ([place]) 02/23/2012  . BARRETT'S ESOPHAGUS 04/30/2009    Orientation RESPIRATION BLADDER Height & Weight     Self, Time, Situation, Place  Normal Continent Weight: 160 lb 1.6 oz (72.6 kg) Height:  5\' 9"  (175.3 cm)  BEHAVIORAL SYMPTOMS/MOOD NEUROLOGICAL BOWEL NUTRITION STATUS      Continent Diet (regular)  AMBULATORY STATUS COMMUNICATION OF NEEDS Skin   Limited Assist Verbally PU-II- on buttocks needs foam dressing PRN                       Personal Care Assistance Level of Assistance  Bathing, Dressing Bathing Assistance: Limited assistance   Dressing Assistance:  Limited assistance     Functional Limitations Info             SPECIAL CARE FACTORS FREQUENCY  PT (By licensed PT), OT (By licensed OT)     PT Frequency: 5/wk OT Frequency: 5/wk            Contractures      Additional Factors Info  Code Status, Allergies, Psychotropic Code Status Info: FULL Allergies Info: Amiodarone, Penicillins Psychotropic Info: zoloft         Current Medications (08/11/2016):  This is the current hospital active medication list Current Facility-Administered Medications  Medication Dose Route Frequency Provider Last Rate Last Dose  . 0.9 %  sodium chloride infusion  250 mL Intravenous PRN Norval Morton, MD      . acetaminophen (TYLENOL) tablet 650 mg  650 mg Oral Q4H PRN Norval Morton, MD      . finasteride (PROSCAR) tablet 5 mg  5 mg Oral QHS Norval Morton, MD   5 mg at 08/10/16 2254  . furosemide (LASIX) injection 40 mg  40 mg Intravenous BID Norval Morton, MD   40 mg at 08/11/16 0818  . Gerhardt's butt cream   Topical QID Donne Hazel, MD      . ipratropium-albuterol (DUONEB) 0.5-2.5 (3) MG/3ML nebulizer solution 3 mL  3 mL Nebulization Q4H PRN Norval Morton, MD      . nebivolol (BYSTOLIC) tablet 5 mg  5 mg Oral Daily Donne Hazel, MD  5 mg at 08/10/16 0848  . neomycin-bacitracin-polymyxin (NEOSPORIN) ointment 1 application  1 application Topical Daily PRN Norval Morton, MD      . nitroGLYCERIN (NITROSTAT) SL tablet 0.4 mg  0.4 mg Sublingual Q5 min PRN Norval Morton, MD      . ondansetron (ZOFRAN) injection 4 mg  4 mg Intravenous Q6H PRN Rondell A Tamala Julian, MD      . potassium chloride SA (K-DUR,KLOR-CON) CR tablet 20 mEq  20 mEq Oral Daily Norval Morton, MD   20 mEq at 08/10/16 0847  . rosuvastatin (CRESTOR) tablet 20 mg  20 mg Oral q1800 Norval Morton, MD   20 mg at 08/10/16 1750  . senna-docusate (Senokot-S) tablet 1 tablet  1 tablet Oral QHS Norval Morton, MD   1 tablet at 08/10/16 2254  . sertraline (ZOLOFT) tablet 50 mg   50 mg Oral Daily Norval Morton, MD   50 mg at 08/10/16 0848  . sodium chloride flush (NS) 0.9 % injection 3 mL  3 mL Intravenous Q12H Norval Morton, MD   3 mL at 08/10/16 2256  . sodium chloride flush (NS) 0.9 % injection 3 mL  3 mL Intravenous PRN Norval Morton, MD      . sotalol (BETAPACE) tablet 80 mg  80 mg Oral Q12H Norval Morton, MD   80 mg at 08/10/16 2254  . tamsulosin (FLOMAX) capsule 0.4 mg  0.4 mg Oral Daily Norval Morton, MD   0.4 mg at 08/10/16 0848  . temazepam (RESTORIL) capsule 30 mg  30 mg Oral QHS Norval Morton, MD   30 mg at 08/10/16 2254  . warfarin (COUMADIN) tablet 5 mg  5 mg Oral ONCE-1800 Emiliano Dyer, [facility]      . Warfarin - Pharmacist Dosing Inpatient   Does not apply q1800 Donne Hazel, MD         Discharge Medications: Please see discharge summary for a list of discharge medications.  Relevant Imaging Results:  Relevant Lab Results:   Additional Information SS#: [id number]  Jorge Ny, LCSW

## 2016-08-11 NOTE — Progress Notes (Signed)
PROGRESS NOTE    Steve Norris  XLK:[id number] DOB: 17-Sep-1929 DOA: 08/08/2016 PCP: Horatio Pel, MD    Brief Narrative:  83 y.o. male with medical history significant of HTN, HLD, COPD, A. fib, SSS s/p PM; who presents with worsening bilateral lower extremity edema and shortness of breath with exertion over the last week. Patient reports taking his prescription for 20 mg of Lasix daily. At baseline patient lives at home alone, but has aids that check on him routinely. Patient complains of being easily winded and short of breath walking any significant distance. He does not routinely weigh himself and reports that he has been sleeping in a recliner for quite some time. Denies any chest pain, cough, diarrhea, nausea, vomiting, abdominal pain, dysuria, blood in stool/urine, or falls. The patient's son is present at bedside and helps provide additional history. He thinks that his father supposedly on inhalers, but has not had them filled in quite some time.  Followed by Dr. Sanda Klein of cardiology. Patient last remote pacemaker check was on 06/24/2016.  ED Course: Upon admission to the emergency department patient was seen to be afebrile, pulse 62-68, respirations 17-19, blood pressures maintained, with 2 saturations as low as 85% with ambulation. Labs revealed WBC 7.9, hemoglobin 9.4, BNP 559.5 and all other labs relatively within normal limits. Chest x-ray shows signs of mild interstitial edema. Patient was given 40 mg of Lasix IV in the ED.  Assessment & Plan:   Principal Problem:   CHF exacerbation ([place]) Active Problems:   COPD (chronic obstructive pulmonary disease) (HCC)   S/P placement of cardiac pacemaker   Long term current use of anticoagulant therapy   Atrial fibrillation with rapid ventricular response (HCC)   Pressure injury of skin   BPH (benign prostatic hyperplasia)   HTN (hypertension)  CHF exacerbation: Acute. Patient presents with bilateral lower extremity  edema and shortness of breath. Chest x-ray shows signs of interstitial edema. BNP elevated at 559.5. Last echocardiogram on 07/2015 with EF noted to be 55-60%. Patient was given 40 mg of Lasix IV in the emergency department with good diuresis. - Continue strict I&Os and daily weights - Elevate lower extremities - Patient is continued on Lasix 40 mg IV Bid - Check echocardiogram - normal LVEF with grade 2 diastolic dysfunction - Tolerating lasix bid  Atrial fibrillation with H/O SSS s/p PM on chronic anticoagulation: PM last remotely interrogated on 2/27 - Continue sotalol and Coumadin per pharmacy - Patient has remained stable  Essential hypertension -Blood pressures are soft -Hold parameters are in place  COPD with mild exacerbation: Acute.  - DuoNeb prn shortness of breath/wheezing - Patient currently stable  Hyperlipidemia - Will continue Crestor as tolerated  BPH - Continue Flomax and Proscar  - Patient noted to have bladder outlet obstruction on 08/09/2016 status post in and out cath. - stable at present  DVT prophylaxis: coumadin Code Status: Full Family Communication: Pt in room, family at bedside Disposition Plan: uncertain at this time  Consultants:     Procedures:     Antimicrobials: Anti-infectives    None      Subjective: Patient without complaints  Objective: Vitals:   08/11/16 0436 08/11/16 0819 08/11/16 1136 08/11/16 1500  BP: (!) 105/57 103/60  (!) 102/53  Pulse: 61 60  61  Resp: 17   16  Temp: 97.6 F (36.4 C)   97.8 F (36.6 C)  TempSrc: Oral   Oral  SpO2: 95%   99%  Weight:  70.3 kg (154 lb 15.7 oz)   Height:        Intake/Output Summary (Last 24 hours) at 08/11/16 1756 Last data filed at 08/11/16 1200  Gross per 24 hour  Intake              480 ml  Output              150 ml  Net              330 ml   Filed Weights   08/09/16 0032 08/09/16 1058 08/11/16 1136  Weight: 76.3 kg (168 lb 3.4 oz) 72.6 kg (160 lb 1.6 oz) 70.3  kg (154 lb 15.7 oz)    Examination:  General exam: Awake, laying in bed, in nad Respiratory system:  Without wheezing, normal resp effort Cardiovascular system:  regular rhythm, s1, s2 Gastrointestinal system:  Normal bowel sounds, soft, nondistended Central nervous system:  No seizures, no tremors Extremities:  Perfused, no clubbing Skin:  No rashes, no pallor Psychiatry:  affect normal, no auditory hallucinations  Data Reviewed: I have personally reviewed following labs and imaging studies  CBC:  Recent Labs Lab 08/08/16 1935 08/09/16 0530  WBC 7.9 7.7  NEUTROABS 3.1 2.5  HGB 9.4* 9.3*  HCT 28.9* 29.0*  MCV 92.9 90.9  PLT 341 829   Basic Metabolic Panel:  Recent Labs Lab 08/08/16 1935 08/09/16 0530 08/10/16 0520 08/11/16 0457  NA 138 138 136 136  K 3.8 3.3* 3.5 3.8  CL 104 99* 95* 99*  CO2 29 34* 33* 30  GLUCOSE 99 94 92 98  BUN 18 15 18  23*  CREATININE 0.53* 0.53* 0.62 0.63  CALCIUM 8.3* 8.4* 8.6* 8.4*   GFR: Estimated Creatinine Clearance: 67.1 mL/min (by C-G formula based on SCr of 0.63 mg/dL). Liver Function Tests: No results for input(s): AST, ALT, ALKPHOS, BILITOT, PROT, ALBUMIN in the last 168 hours. No results for input(s): LIPASE, AMYLASE in the last 168 hours. No results for input(s): AMMONIA in the last 168 hours. Coagulation Profile:  Recent Labs Lab 08/08/16 1935 08/09/16 0735 08/10/16 0520 08/11/16 0457  INR 2.16 1.93 1.74 1.73   Cardiac Enzymes: No results for input(s): CKTOTAL, CKMB, CKMBINDEX, TROPONINI in the last 168 hours. BNP (last 3 results) No results for input(s): PROBNP in the last 8760 hours. HbA1C: No results for input(s): HGBA1C in the last 72 hours. CBG: No results for input(s): GLUCAP in the last 168 hours. Lipid Profile: No results for input(s): CHOL, HDL, LDLCALC, TRIG, CHOLHDL, LDLDIRECT in the last 72 hours. Thyroid Function Tests: No results for input(s): TSH, T4TOTAL, FREET4, T3FREE, THYROIDAB in the last  72 hours. Anemia Panel: No results for input(s): VITAMINB12, FOLATE, FERRITIN, TIBC, IRON, RETICCTPCT in the last 72 hours. Sepsis Labs: No results for input(s): PROCALCITON, LATICACIDVEN in the last 168 hours.  No results found for this or any previous visit (from the past 240 hour(s)).   Radiology Studies: No results found.  Scheduled Meds: . finasteride  5 mg Oral QHS  . furosemide  40 mg Intravenous BID  . Gerhardt's butt cream   Topical QID  . nebivolol  5 mg Oral Daily  . potassium chloride SA  20 mEq Oral Daily  . rosuvastatin  20 mg Oral q1800  . senna-docusate  1 tablet Oral QHS  . sertraline  50 mg Oral Daily  . sodium chloride flush  3 mL Intravenous Q12H  . sotalol  80 mg Oral Q12H  . tamsulosin  0.4  mg Oral Daily  . temazepam  30 mg Oral QHS  . warfarin  5 mg Oral ONCE-1800  . Warfarin - Pharmacist Dosing Inpatient   Does not apply q1800   Continuous Infusions:   LOS: 3 days   Ouita Nish, Orpah Melter, MD [facility] Pager [phone]  If 7PM-7AM, please contact night-coverage [email or web address] Password [facility] 08/11/2016, 5:56 PM

## 2016-08-11 NOTE — Progress Notes (Signed)
Physical Therapy Treatment Patient Details Name: Steve Norris MRN: [id number] DOB: 12-03-1929 Today's Date: 08/11/2016    History of Present Illness Steve Norris is a 83 y.o. male with medical history significant of HTN, HLD, COPD, A. fib, SSS s/p PM; who presents with worsening bilateral lower extremity edema and shortness of breath with exertion over the last week    PT Comments    The patient's BP were reported low by RN. Orthostatics taken: supine= 101/46, sitting= 88/52,   Standing = 79/45 , @ 3 mins=79/56  Follow Up Recommendations  SNF;Supervision/Assistance - 24 hour     Equipment Recommendations  None recommended by PT    Recommendations for Other Services       Precautions / Restrictions Precautions Precautions: Fall Precaution Comments: monitor BP    Mobility  Bed Mobility   Bed Mobility: Supine to Sit     Supine to sit: Min guard        Transfers Overall transfer level: Needs assistance Equipment used: 1 person hand held assist Transfers: Sit to/from Stand Sit to Stand: Min guard            Ambulation/Gait Ambulation/Gait assistance: Min assist Ambulation Distance (Feet): 100 Feet Assistive device: 1 person hand held assist Gait Pattern/deviations: Staggering right;Staggering left;Step-through pattern;Shuffle;Scissoring     General Gait Details: patient  demonstrates decreased balance duering ambulation away from objects to cruise on.    Stairs            Wheelchair Mobility    Modified Rankin (Stroke Patients Only)       Balance     Sitting balance-Leahy Scale: Good     Standing balance support: During functional activity;No upper extremity supported Standing balance-Leahy Scale: Poor                              Cognition Arousal/Alertness: Awake/alert     Area of Impairment: Orientation;Memory;Safety/judgement;Awareness                 Orientation Level: Time;Situation   Memory: Decreased  short-term memory         General Comments: son present, the patient thought his son brought him to hospital yesterday. the patient denies that he has caregivers, son states the caregivers come 2 x's /day.      Exercises      General Comments        Pertinent Vitals/Pain Pain Assessment: Faces Faces Pain Scale: Hurts little more Pain Location: left hip Pain Descriptors / Indicators: Discomfort Pain Intervention(s): Monitored during session    Home Living                      Prior Function            PT Goals (current goals can now be found in the care plan section) Progress towards PT goals: Progressing toward goals    Frequency    Min 3X/week      PT Plan Discharge plan needs to be updated    Co-evaluation             End of Session Equipment Utilized During Treatment: Gait belt Activity Tolerance: Patient tolerated treatment well Patient left: in chair;with call bell/phone within reach;with chair alarm set Nurse Communication: Mobility status PT Visit Diagnosis: Unsteadiness on feet (R26.81)     Time: 7253-6644 PT Time Calculation (min) (ACUTE ONLY): 18 min  Charges:  $Gait Training:  8-22 mins                    G CodesTresa Endo PT [phone]    Claretha Cooper 08/11/2016, 10:51 AM

## 2016-08-11 NOTE — Progress Notes (Signed)
Small stage II to left buttock, cream applied and foam

## 2016-08-11 NOTE — Clinical Social Work Note (Signed)
Clinical Social Work Assessment  Patient Details  Name: Steve Norris MRN: [id number] Date of Birth: [birth year]  Date of referral:  08/11/16               Reason for consult:  Facility Placement                Permission sought to share information with:  Chartered certified accountant granted to share information::  Yes, Verbal Permission Granted  Name::     Risk manager::  SNF  Relationship::  son  Contact Information:     Housing/Transportation Living arrangements for the past 2 months:  [place] of Information:  Patient, Adult Children Patient Interpreter Needed:  None Criminal Activity/Legal Involvement Pertinent to Current Situation/Hospitalization:    Significant Relationships:  Adult Children Lives with:  Self Do you feel safe going back to the place where you live?  No Need for family participation in patient care:  Yes (Comment)  Care giving concerns:  Pt lives at home alone but with help of aids that come in a few hours a day in the morning and the evening.  Patient with decreased mobility and pt son does not think pt is capable of caring for himself without 24 hour assistance at this time.   Social Worker assessment / plan:  CSW spoke with pt and pt son at bedside concerning SNF stay.  CSW explained SNF and SNF referral process. Pt son states that pt wife had been to multiple SNFs in the past but patient has never been.  Employment status:  Retired Nurse, adult PT Recommendations:  Home with [place] / Referral to community resources:  [place]  Patient/Family's Response to care:  Patient initially not interested in SNF- states that due to his back pain he doesn't think he could participate.  CSW discussed further with pt and explained that SNF would meet patient where he is at and attempt to help with strength building to help with pain and mobility.  Patient/Family's  Understanding of and Emotional Response to Diagnosis, Current Treatment, and Prognosis:  Patient and son seem to have good understanding of pt condition- hopeful that rehab will start patient moving again and motivate him to be more mobile at home which son acknowledges is contributing him to his current weakness.  Emotional Assessment Appearance:  Appears stated age Attitude/Demeanor/Rapport:    Affect (typically observed):  Appropriate, Accepting Orientation:  Oriented to Self, Oriented to Place, Oriented to  Time, Oriented to Situation Alcohol / Substance use:  Not Applicable Psych involvement (Current and /or in the community):  No (Comment)  Discharge Needs  Concerns to be addressed:  Care Coordination Readmission within the last 30 days:  No Current discharge risk:  Physical Impairment, Lives alone Barriers to Discharge:  Continued Medical Work up   Jorge Ny, LCSW 08/11/2016, 10:55 AM

## 2016-08-11 NOTE — Progress Notes (Signed)
Physical Therapy Treatment Patient Details Name: Steve Norris MRN: [id number] DOB: 03-May-1929 Today's Date: 08/11/2016    History of Present Illness Steve Norris is a 83 y.o. male with medical history significant of HTN, HLD, COPD, A. fib, SSS s/p PM; who presents with worsening bilateral lower extremity edema and shortness of breath with exertion over the last week    PT Comments    The patient is pleasantly confused. Son present and reports that patient is home alone at night and several hours during the day. Patient does not demonstrate  safety for ambulation, judgement for  being home alone. Recommend SNF. Continue PT while in acute care. The patient's balance appeared less stable and safe  today and required at least 1 handhold assist for balance.  Follow Up Recommendations  SNF;Supervision/Assistance - 24 hour     Equipment Recommendations  None recommended by PT    Recommendations for Other Services       Precautions / Restrictions Precautions Precautions: Fall Precaution Comments: monitor BP    Mobility  Bed Mobility   Bed Mobility: Supine to Sit     Supine to sit: Min guard        Transfers Overall transfer level: Needs assistance Equipment used: 1 person hand held assist Transfers: Sit to/from Stand Sit to Stand: Min guard            Ambulation/Gait Ambulation/Gait assistance: Min assist Ambulation Distance (Feet): 100 Feet Assistive device: 1 person hand held assist Gait Pattern/deviations: Staggering right;Staggering left;Step-through pattern;Shuffle;Scissoring     General Gait Details: patient  demonstrates decreased balance duering ambulation away from objects to cruise on.    Stairs            Wheelchair Mobility    Modified Rankin (Stroke Patients Only)       Balance     Sitting balance-Leahy Scale: Good     Standing balance support: During functional activity;No upper extremity supported Standing balance-Leahy Scale:  Poor                              Cognition Arousal/Alertness: Awake/alert     Area of Impairment: Orientation;Memory;Safety/judgement;Awareness                 Orientation Level: Time;Situation   Memory: Decreased short-term memory         General Comments: son present, the patient thought his son brought him to hospital yesterday. the patient denies that he has caregivers, son states the caregivers come 2 x's /day.      Exercises      General Comments        Pertinent Vitals/Pain Pain Assessment: Faces Faces Pain Scale: Hurts little more Pain Location: left hip Pain Descriptors / Indicators: Discomfort Pain Intervention(s): Monitored during session    Home Living                      Prior Function            PT Goals (current goals can now be found in the care plan section) Progress towards PT goals: Progressing toward goals    Frequency    Min 3X/week      PT Plan Discharge plan needs to be updated    Co-evaluation             End of Session Equipment Utilized During Treatment: Gait belt Activity Tolerance: Patient tolerated treatment well Patient  left: in chair;with call bell/phone within reach;with chair alarm set Nurse Communication: Mobility status PT Visit Diagnosis: Unsteadiness on feet (R26.81)     Time: 2712-9290 PT Time Calculation (min) (ACUTE ONLY): 18 min  Charges:  $Gait Training: 8-22 mins                    G CodesTresa Endo PT [phone]   Claretha Cooper 08/11/2016, 11:03 AM

## 2016-08-11 NOTE — Progress Notes (Signed)
ANTICOAGULATION CONSULT NOTE - Follow Up Consult  Pharmacy Consult for warfarin Indication: hx atrial fibrillation  Allergies  Allergen Reactions  . Amiodarone Nausea And Vomiting  . Penicillins Hives, Swelling and Rash    Patient Measurements: Height: 5\' 9"  (175.3 cm) Weight: 160 lb 1.6 oz (72.6 kg) IBW/kg (Calculated) : 70.7 Heparin Dosing Weight:   Vital Signs: Temp: 97.6 F (36.4 C) (04/16 0436) Temp Source: Oral (04/16 0436) BP: 105/57 (04/16 0436) Pulse Rate: 61 (04/16 0436)  Labs:  Recent Labs  08/08/16 1935 08/09/16 0530 08/09/16 0735 08/10/16 0520 08/11/16 0457  HGB 9.4* 9.3*  --   --   --   HCT 28.9* 29.0*  --   --   --   PLT 341 361  --   --   --   LABPROT 24.5*  --  22.4* 20.5* 20.5*  INR 2.16  --  1.93 1.74 1.73  CREATININE 0.53* 0.53*  --  0.62 0.63    Estimated Creatinine Clearance: 67.5 mL/min (by C-G formula based on SCr of 0.63 mg/dL).   Medications:  Home warfarin regimen: 2.5 mg daily except 5 mg on TTSun (last dose on 4/12 PTA)  Assessment: Patient's an 83 y.o M with hx heart failure and Afib on warfarin PTA, presented to the ED on 08/08/16 with c/o leg swelling and SOB.  Warfarin resumed on admission.  Today, 08/11/2016: - INR subtherapeutic at 1.73 (but looks like he missed his dose on 4/13) - cbc relatively stable with last labs on 4/14 - no bleeding documented - on regular diet - no signif. Drug-drug intxns   Goal of Therapy:  INR 2-3 Monitor platelets by anticoagulation protocol: Yes   Plan:  - warfarin 5 mg PO x1 today - daily INR - monitor for s/s bleeding  Peggyann Juba, PharmD, BCPS Pager: [phone] 08/11/2016,8:19 AM

## 2016-08-12 ENCOUNTER — Encounter (HOSPITAL_COMMUNITY): Payer: Self-pay

## 2016-08-12 LAB — BASIC METABOLIC PANEL
ANION GAP: 6 (ref 5–15)
BUN: 21 mg/dL — ABNORMAL HIGH (ref 6–20)
CALCIUM: 8.4 mg/dL — AB (ref 8.9–10.3)
CO2: 30 mmol/L (ref 22–32)
CREATININE: 0.64 mg/dL (ref 0.61–1.24)
Chloride: 98 mmol/L — ABNORMAL LOW (ref 101–111)
GFR calc non Af Amer: >60 mL/min (ref >60)
GLUCOSE: 104 mg/dL — AB (ref 65–99)
Potassium: 3.7 mmol/L (ref 3.5–5.1)
Sodium: 134 mmol/L — ABNORMAL LOW (ref 135–145)

## 2016-08-12 LAB — PROTIME-INR
INR: 1.94
Prothrombin Time: 22.5 seconds — ABNORMAL HIGH (ref 11.4–15.2)

## 2016-08-12 MED ORDER — WARFARIN SODIUM 5 MG PO TABS
5.0000 mg | ORAL_TABLET | Freq: Once | ORAL | Status: AC
  Administered 2016-08-12: 5 mg via ORAL
  Filled 2016-08-12: qty 1

## 2016-08-12 NOTE — Care Management Important Message (Signed)
Important Message  Patient Details  Name: Steve Norris MRN: [id number] Date of Birth: 1929/09/30   Medicare Important Message Given:  Yes    Kerin Salen 08/12/2016, 10:04 AMImportant Message  Patient Details  Name: Steve Norris MRN: [id number] Date of Birth: [birth year]   Medicare Important Message Given:  Yes    Kerin Salen 08/12/2016, 10:03 AM

## 2016-08-12 NOTE — Progress Notes (Signed)
Pt discharging to SNF, CSW following,

## 2016-08-12 NOTE — Clinical Social Work Placement (Signed)
Patient has received and accepted bed offer at [facility]. CSW will continue to follow patient to assist with discharge planning.   CLINICAL SOCIAL WORK PLACEMENT  NOTE  Date:  08/12/2016  Patient Details  Name: Steve Norris MRN: [id number] Date of Birth: 03-16-1930  Clinical Social Work is seeking post-discharge placement for this patient at the [place] level of care (*CSW will initial, date and re-position this form in  chart as items are completed):  Yes   Patient/family provided with [place] Work Department's list of facilities offering this level of care within the geographic area requested by the patient (or if unable, by the patient's family).  Yes   Patient/family informed of their freedom to choose among providers that offer the needed level of care, that participate in Medicare, Medicaid or managed care program needed by the patient, have an available bed and are willing to accept the patient.  Yes   Patient/family informed of [facility]'s ownership interest in [facility] and [facility], as well as of the fact that they are under no obligation to receive care at these facilities.  PASRR submitted to EDS on 08/11/16     PASRR number received on 08/11/16     Existing PASRR number confirmed on       FL2 transmitted to all facilities in geographic area requested by pt/family on 08/11/16     FL2 transmitted to all facilities within larger geographic area on       Patient informed that his/her managed care company has contracts with or will negotiate with certain facilities, including the following:        Yes   Patient/family informed of bed offers received.  Patient chooses bed at [facility], [place]     Physician recommends and patient chooses bed at      Patient to be transferred to   on  .  Patient to be transferred to facility by       Patient family notified on   of transfer.  Name of family member  notified:        PHYSICIAN       Additional Comment:    _______________________________________________ Burnis Medin, LCSW 08/12/2016, 5:38 PM

## 2016-08-12 NOTE — Progress Notes (Signed)
ANTICOAGULATION CONSULT NOTE - Follow Up Consult  Pharmacy Consult for warfarin Indication: hx atrial fibrillation  Allergies  Allergen Reactions  . Amiodarone Nausea And Vomiting  . Penicillins Hives, Swelling and Rash    Patient Measurements: Height: 5\' 9"  (175.3 cm) Weight: 156 lb 1.4 oz (70.8 kg) IBW/kg (Calculated) : 70.7 Heparin Dosing Weight:   Vital Signs: Temp: 97.7 F (36.5 C) (04/17 0458) Temp Source: Oral (04/17 0458) BP: 116/60 (04/17 0458) Pulse Rate: 60 (04/17 0458)  Labs:  Recent Labs  08/10/16 0520 08/11/16 0457 08/12/16 0416  LABPROT 20.5* 20.5* 22.5*  INR 1.74 1.73 1.94  CREATININE 0.62 0.63 0.64    Estimated Creatinine Clearance: 67.5 mL/min (by C-G formula based on SCr of 0.64 mg/dL).   Medications:  Home warfarin regimen: 2.5 mg daily except 5 mg on TTSun (last dose on 4/12 PTA)  Assessment: Patient's an 83 y.o M with hx heart failure and Afib on warfarin PTA, presented to the ED on 08/08/16 with c/o leg swelling and SOB.  Warfarin resumed on admission.  Today, 08/12/2016: - INR almost therapeutic at 1.94 (appears he missed his dose on 4/13) - CBC relatively stable with last labs on 4/14 - No bleeding documented - Tolerating regular diet - No significant drug-drug intxns   Goal of Therapy:  INR 2-3 Monitor platelets by anticoagulation protocol: Yes   Plan:  - Warfarin 5 mg PO x1 today - Daily INR - CBC in AM - Monitor for s/s bleeding  Peggyann Juba, PharmD, BCPS Pager: [phone] 08/12/2016,7:21 AM

## 2016-08-12 NOTE — Progress Notes (Addendum)
PROGRESS NOTE    Steve Norris  AST:[id number] DOB: November 02, 1929 DOA: 08/08/2016 PCP: Horatio Pel, MD    Brief Narrative:  83 y.o. male with medical history significant of HTN, HLD, COPD, A. fib, SSS s/p PM; who presents with worsening bilateral lower extremity edema and shortness of breath with exertion over the last week. Patient reports taking his prescription for 20 mg of Lasix daily. At baseline patient lives at home alone, but has aids that check on him routinely. Patient complains of being easily winded and short of breath walking any significant distance. He does not routinely weigh himself and reports that he has been sleeping in a recliner for quite some time. Denies any chest pain, cough, diarrhea, nausea, vomiting, abdominal pain, dysuria, blood in stool/urine, or falls. The patient's son is present at bedside and helps provide additional history. He thinks that his father supposedly on inhalers, but has not had them filled in quite some time.  Followed by Dr. Sanda Klein of cardiology. Patient last remote pacemaker check was on 06/24/2016.  ED Course: Upon admission to the emergency department patient was seen to be afebrile, pulse 62-68, respirations 17-19, blood pressures maintained, with 2 saturations as low as 85% with ambulation. Labs revealed WBC 7.9, hemoglobin 9.4, BNP 559.5 and all other labs relatively within normal limits. Chest x-ray shows signs of mild interstitial edema. Patient was given 40 mg of Lasix IV in the ED.  During his hospital course, patient was continued on IV Lasix with notable improvement.  Assessment & Plan:   Principal Problem:   CHF exacerbation ([place]) Active Problems:   COPD (chronic obstructive pulmonary disease) (HCC)   S/P placement of cardiac pacemaker   Long term current use of anticoagulant therapy   Atrial fibrillation with rapid ventricular response (HCC)   Pressure injury of skin   BPH (benign prostatic hyperplasia)   HTN  (hypertension)  CHF exacerbation: Acute. Patient presents with bilateral lower extremity edema and shortness of breath. Chest x-ray shows signs of interstitial edema. BNP elevated at 559.5. Last echocardiogram on 07/2015 with EF noted to be 55-60%. Patient was given 40 mg of Lasix IV in the emergency department with good diuresis. - Will continue strict I&Os and daily weights - Continue to elevate lower extremities - Patient will be continued on Lasix 40 mg IV Bid - 2d echocardiogram with normal LVEF with grade 2 diastolic dysfunction - Patient is tolerating lasix bid  Atrial fibrillation with H/O SSS s/p PM on chronic anticoagulation: PM last remotely interrogated on 2/27 - Will continue sotalol and Coumadin per pharmacy - Patient has remained stable thus far  Essential hypertension -Blood pressures currently stable -Hold parameters are in place  COPD with mild exacerbation: Acute.  - DuoNeb prn shortness of breath/wheezing - presently stable  Hyperlipidemia - Plan to continue Crestor as tolerated  BPH - Continue Flomax and Proscar  - Patient noted to have bladder outlet obstruction on 08/09/2016 status post in and out cath. - voiding well at present  DVT prophylaxis: coumadin Code Status: Full Family Communication: Pt in room, family at bedside Disposition Plan: possible d/c to SNF in 24hrs  Consultants:     Procedures:     Antimicrobials: Anti-infectives    None      Subjective: Reports feeling better  Objective: Vitals:   08/11/16 1500 08/11/16 2302 08/12/16 0458 08/12/16 1516  BP: (!) 102/53 (!) 124/59 116/60 104/62  Pulse: 61 60 60 64  Resp: 16 16 18 18   Temp:  97.8 F (36.6 C) 97.8 F (36.6 C) 97.7 F (36.5 C) 98.3 F (36.8 C)  TempSrc: Oral Oral Oral Oral  SpO2: 99% 97% 93% 97%  Weight:   70.8 kg (156 lb 1.4 oz)   Height:        Intake/Output Summary (Last 24 hours) at 08/12/16 1734 Last data filed at 08/12/16 1724  Gross per 24 hour    Intake              590 ml  Output                0 ml  Net              590 ml   Filed Weights   08/09/16 1058 08/11/16 1136 08/12/16 0458  Weight: 72.6 kg (160 lb 1.6 oz) 70.3 kg (154 lb 15.7 oz) 70.8 kg (156 lb 1.4 oz)    Examination:  General exam: conversant, laying in bed, in nad Respiratory system:  Normal resp effort, no audible wheezing Cardiovascular system:  regular rate, s1-2 on auscultation Gastrointestinal system:  Pos bs, soft, nondistended Central nervous system:  cn2-12 grossly intact, strength intact Extremities:  No cyanosis, no joint deformities, LE edema improved Skin:  Normal skin turgor, no notable skin lesions seen Psychiatry:  mood normal, no visual hallucinations  Data Reviewed: I have personally reviewed following labs and imaging studies  CBC:  Recent Labs Lab 08/08/16 1935 08/09/16 0530  WBC 7.9 7.7  NEUTROABS 3.1 2.5  HGB 9.4* 9.3*  HCT 28.9* 29.0*  MCV 92.9 90.9  PLT 341 782   Basic Metabolic Panel:  Recent Labs Lab 08/08/16 1935 08/09/16 0530 08/10/16 0520 08/11/16 0457 08/12/16 0416  NA 138 138 136 136 134*  K 3.8 3.3* 3.5 3.8 3.7  CL 104 99* 95* 99* 98*  CO2 29 34* 33* 30 30  GLUCOSE 99 94 92 98 104*  BUN 18 15 18  23* 21*  CREATININE 0.53* 0.53* 0.62 0.63 0.64  CALCIUM 8.3* 8.4* 8.6* 8.4* 8.4*   GFR: Estimated Creatinine Clearance: 67.5 mL/min (by C-G formula based on SCr of 0.64 mg/dL). Liver Function Tests: No results for input(s): AST, ALT, ALKPHOS, BILITOT, PROT, ALBUMIN in the last 168 hours. No results for input(s): LIPASE, AMYLASE in the last 168 hours. No results for input(s): AMMONIA in the last 168 hours. Coagulation Profile:  Recent Labs Lab 08/08/16 1935 08/09/16 0735 08/10/16 0520 08/11/16 0457 08/12/16 0416  INR 2.16 1.93 1.74 1.73 1.94   Cardiac Enzymes: No results for input(s): CKTOTAL, CKMB, CKMBINDEX, TROPONINI in the last 168 hours. BNP (last 3 results) No results for input(s): PROBNP in  the last 8760 hours. HbA1C: No results for input(s): HGBA1C in the last 72 hours. CBG: No results for input(s): GLUCAP in the last 168 hours. Lipid Profile: No results for input(s): CHOL, HDL, LDLCALC, TRIG, CHOLHDL, LDLDIRECT in the last 72 hours. Thyroid Function Tests: No results for input(s): TSH, T4TOTAL, FREET4, T3FREE, THYROIDAB in the last 72 hours. Anemia Panel: No results for input(s): VITAMINB12, FOLATE, FERRITIN, TIBC, IRON, RETICCTPCT in the last 72 hours. Sepsis Labs: No results for input(s): PROCALCITON, LATICACIDVEN in the last 168 hours.  No results found for this or any previous visit (from the past 240 hour(s)).   Radiology Studies: No results found.  Scheduled Meds: . finasteride  5 mg Oral QHS  . furosemide  40 mg Intravenous BID  . Gerhardt's butt cream   Topical QID  . nebivolol  5 mg  Oral Daily  . potassium chloride SA  20 mEq Oral Daily  . rosuvastatin  20 mg Oral q1800  . senna-docusate  1 tablet Oral QHS  . sertraline  50 mg Oral Daily  . sodium chloride flush  3 mL Intravenous Q12H  . sotalol  80 mg Oral Q12H  . tamsulosin  0.4 mg Oral Daily  . temazepam  30 mg Oral QHS  . Warfarin - Pharmacist Dosing Inpatient   Does not apply q1800   Continuous Infusions: . sodium chloride       LOS: 4 days   Ann Bohne, Orpah Melter, MD [facility] Pager [phone]  If 7PM-7AM, please contact night-coverage [email or web address] Password [facility] 08/12/2016, 5:34 PM

## 2016-08-12 NOTE — Clinical Social Work Placement (Signed)
Patient received and accepted bed offer at [facility]. CSW will continue to follow to assist with discharge planning.   CLINICAL SOCIAL WORK PLACEMENT  NOTE  Date:  08/12/2016  Patient Details  Name: Steve Norris MRN: [id number] Date of Birth: 08-16-1929  Clinical Social Work is seeking post-discharge placement for this patient at the [place] level of care (*CSW will initial, date and re-position this form in  chart as items are completed):  Yes   Patient/family provided with [facility] Work Department's list of facilities offering this level of care within the geographic area requested by the patient (or if unable, by the patient's family).  Yes   Patient/family informed of their freedom to choose among providers that offer the needed level of care, that participate in Medicare, Medicaid or managed care program needed by the patient, have an available bed and are willing to accept the patient.  Yes   Patient/family informed of [facility]'s ownership interest in [facility] and [facility], as well as of the fact that they are under no obligation to receive care at these facilities.  PASRR submitted to EDS on 08/11/16     PASRR number received on 08/11/16     Existing PASRR number confirmed on       FL2 transmitted to all facilities in geographic area requested by pt/family on 08/11/16     FL2 transmitted to all facilities within larger geographic area on       Patient informed that his/her managed care company has contracts with or will negotiate with certain facilities, including the following:        Yes   Patient/family informed of bed offers received.  Patient chooses bed at [facility] [place]     Physician recommends and patient chooses bed at      Patient to be transferred to   on  .  Patient to be transferred to facility by       Patient family notified on   of transfer.  Name of family member notified:         PHYSICIAN       Additional Comment:    _______________________________________________ Burnis Medin, LCSW 08/12/2016, 11:16 AM

## 2016-08-13 DIAGNOSIS — J449 Chronic obstructive pulmonary disease, unspecified: Secondary | ICD-10-CM | POA: Diagnosis not present

## 2016-08-13 DIAGNOSIS — Z950 Presence of cardiac pacemaker: Secondary | ICD-10-CM | POA: Diagnosis not present

## 2016-08-13 DIAGNOSIS — I10 Essential (primary) hypertension: Secondary | ICD-10-CM

## 2016-08-13 DIAGNOSIS — Z7901 Long term (current) use of anticoagulants: Secondary | ICD-10-CM | POA: Diagnosis not present

## 2016-08-13 DIAGNOSIS — M6281 Muscle weakness (generalized): Secondary | ICD-10-CM | POA: Diagnosis not present

## 2016-08-13 DIAGNOSIS — J418 Mixed simple and mucopurulent chronic bronchitis: Secondary | ICD-10-CM | POA: Diagnosis not present

## 2016-08-13 DIAGNOSIS — R0609 Other forms of dyspnea: Secondary | ICD-10-CM | POA: Diagnosis not present

## 2016-08-13 DIAGNOSIS — R609 Edema, unspecified: Secondary | ICD-10-CM | POA: Diagnosis not present

## 2016-08-13 DIAGNOSIS — R41841 Cognitive communication deficit: Secondary | ICD-10-CM | POA: Diagnosis not present

## 2016-08-13 DIAGNOSIS — R278 Other lack of coordination: Secondary | ICD-10-CM | POA: Diagnosis not present

## 2016-08-13 DIAGNOSIS — I5031 Acute diastolic (congestive) heart failure: Secondary | ICD-10-CM | POA: Diagnosis not present

## 2016-08-13 DIAGNOSIS — I509 Heart failure, unspecified: Secondary | ICD-10-CM | POA: Diagnosis not present

## 2016-08-13 DIAGNOSIS — R2681 Unsteadiness on feet: Secondary | ICD-10-CM | POA: Diagnosis not present

## 2016-08-13 DIAGNOSIS — I4891 Unspecified atrial fibrillation: Secondary | ICD-10-CM | POA: Diagnosis not present

## 2016-08-13 DIAGNOSIS — N400 Enlarged prostate without lower urinary tract symptoms: Secondary | ICD-10-CM | POA: Diagnosis not present

## 2016-08-13 DIAGNOSIS — J80 Acute respiratory distress syndrome: Secondary | ICD-10-CM | POA: Diagnosis not present

## 2016-08-13 LAB — BASIC METABOLIC PANEL
Anion gap: 6 (ref 5–15)
BUN: 21 mg/dL — AB (ref 6–20)
CHLORIDE: 98 mmol/L — AB (ref 101–111)
CO2: 31 mmol/L (ref 22–32)
CREATININE: 0.59 mg/dL — AB (ref 0.61–1.24)
Calcium: 8.6 mg/dL — ABNORMAL LOW (ref 8.9–10.3)
GFR calc Af Amer: >60 mL/min (ref >60)
GFR calc non Af Amer: >60 mL/min (ref >60)
Glucose, Bld: 94 mg/dL (ref 65–99)
Potassium: 3.7 mmol/L (ref 3.5–5.1)
SODIUM: 135 mmol/L (ref 135–145)

## 2016-08-13 LAB — CBC
HEMATOCRIT: 30.8 % — AB (ref 39.0–52.0)
HEMOGLOBIN: 10 g/dL — AB (ref 13.0–17.0)
MCH: 28.8 pg (ref 26.0–34.0)
MCHC: 32.5 g/dL (ref 30.0–36.0)
MCV: 88.8 fL (ref 78.0–100.0)
Platelets: 355 10*3/uL (ref 150–400)
RBC: 3.47 MIL/uL — AB (ref 4.22–5.81)
RDW: 16 % — ABNORMAL HIGH (ref 11.5–15.5)
WBC: 7 10*3/uL (ref 4.0–10.5)

## 2016-08-13 LAB — PROTIME-INR
INR: 2.28
PROTHROMBIN TIME: 25.5 s — AB (ref 11.4–15.2)

## 2016-08-13 MED ORDER — WARFARIN SODIUM 5 MG PO TABS: ORAL_TABLET | ORAL | 1 refills | Status: DC

## 2016-08-13 MED ORDER — WARFARIN SODIUM 2.5 MG PO TABS: 2.5000 mg | ORAL_TABLET | Freq: Once | ORAL | Status: DC

## 2016-08-13 NOTE — Discharge Planning (Deleted)
Discharge Summary  Steve Norris KGM:[id number] DOB: Sep 19, 1929  PCP: Horatio Pel, MD  Admit date: 08/08/2016 Discharge date: 08/13/2016  Time spent: 25 minutes   Recommendations for Outpatient Follow-up:  1. Patient being discharged to [facility]   Discharge Diagnoses:  Active Hospital Problems   Diagnosis Date Noted  . CHF exacerbation ([place]) 08/08/2016  . Pressure injury of skin 08/09/2016  . BPH (benign prostatic hyperplasia) 08/09/2016  . HTN (hypertension) 08/09/2016  . Atrial fibrillation with rapid ventricular response ([place]) 11/04/2014  . Long term current use of anticoagulant therapy 07/14/2012  . COPD (chronic obstructive pulmonary disease) ([place]) 02/23/2012  . S/P placement of cardiac pacemaker 02/23/2012    Resolved Hospital Problems   Diagnosis Date Noted Date Resolved  No resolved problems to display.    Discharge Condition: Improved, being discharged to skilled nursing facility   Diet recommendation: Heart healthy   Vitals:   08/12/16 2143 08/13/16 0649  BP: (!) 105/52 (!) 107/54  Pulse: 63 (!) 59  Resp: 14 14  Temp: 97.9 F (36.6 C) 98.2 F (36.8 C)    History of present illness:  83 year old male with past mental history of hypertension, atrial fibrillation, sick sinus syndrome status post pacemaker and COPD admitted on 4/13 after coming in for one week of progressively worsening dyspnea on exertion and found to be in CHF.   Hospital Course:  Principal Problem:   Acute diastolic heart failure [facility]): Patient started on IV Lasix. BNP elevated at 560. Echocardiogram done 4/14 noted grade 2 diastolic dysfunction. Patient tolerated well. His weight on admission in the emergency room was 168 pounds and arrival to floor was 160 and by day of discharge he was down to 150. Active Problems:   COPD (chronic obstructive pulmonary disease) ([place]): Stable   S/P placement of cardiac pacemaker   Long term current use of anticoagulant  therapy   Atrial fibrillation with rapid ventricular response ([place]): Once diuresed, heart rate normal. Discharged on sotalol. Chads 2 score of 6. He is on Coumadin and by day of discharge, INR 2.28. Status post pacemaker   Pressure injury of skin   BPH (benign prostatic hyperplasia): Stable continue on Flomax   HTN (hypertension): Blood pressure stable on discharge. Prior to admission, patient was living alone, but with help of AIDS in the morning and evening. With his decreased mobility, patient and son felt like he was not able to care for himself without 24 hour assistance. He was value by physical therapy and was amenable to going to a skilled nursing facility.     Procedures:  Echocardiogram done 6/44: Grade 2 diastolic dysfunction  Consultations:  None   Discharge Exam: BP (!) 107/54 (BP Location: Left Arm)   Pulse (!) 59   Temp 98.2 F (36.8 C) (Oral)   Resp 14   Ht 5\' 9"  (1.753 m)   Wt 68.2 kg (150 lb 5.7 oz)   SpO2 92%   BMI 22.20 kg/m   General: Alert and oriented 3, no acute distress  Cardiovascular: Regular rate and rhythm, S1-S2 Respiratory: Clear to auscultation bilaterally   Discharge Instructions You were cared for by a hospitalist during your hospital stay. If you have any questions about your discharge medications or the care you received while you were in the hospital after you are discharged, you can call the unit and asked to speak with the hospitalist on call if the hospitalist that took care of you is not available. Once you are discharged, your  primary care physician will handle any further medical issues. Please note that NO REFILLS for any discharge medications will be authorized once you are discharged, as it is imperative that you return to your primary care physician (or establish a relationship with a primary care physician if you do not have one) for your aftercare needs so that they can reassess your need for medications and monitor your lab  values.  Discharge Instructions    Diet - low sodium heart healthy    Complete by:  As directed    Increase activity slowly    Complete by:  As directed      Allergies as of 08/13/2016      Reactions   Amiodarone Nausea And Vomiting   Penicillins Hives, Swelling, Rash      Medication List    TAKE these medications   finasteride 5 MG tablet Commonly known as:  PROSCAR Take 5 mg by mouth at bedtime.   furosemide 20 MG tablet Commonly known as:  LASIX Take 1 tablet (20 mg total) by mouth daily.   nebivolol 5 MG tablet Commonly known as:  BYSTOLIC Take 1 tablet (5 mg total) by mouth daily.   neomycin-bacitracin-polymyxin Oint Commonly known as:  NEOSPORIN Apply 1 application topically daily as needed for wound care. Reported on 08/31/2015   nitroGLYCERIN 0.4 MG SL tablet Commonly known as:  NITROSTAT Place 0.4 mg under the tongue every 5 (five) minutes as needed for chest pain.   potassium chloride SA 20 MEQ tablet Commonly known as:  K-DUR,KLOR-CON take 1 tablet by mouth once daily   rosuvastatin 20 MG tablet Commonly known as:  CRESTOR 20 mg daily.   SENNA-S 8.6-50 MG tablet Generic drug:  senna-docusate Take 1 tablet by mouth at bedtime.   sertraline 50 MG tablet Commonly known as:  ZOLOFT Take 1 tablet by mouth daily.   sotalol 80 MG tablet Commonly known as:  BETAPACE take 1 tablet by mouth every 12 hours   tamsulosin 0.4 MG Caps capsule Commonly known as:  FLOMAX Take 1 capsule by mouth daily.   temazepam 30 MG capsule Commonly known as:  RESTORIL Take 30 mg by mouth at bedtime.   warfarin 5 MG tablet Commonly known as:  COUMADIN Take 0.5 tablet (2.5 mg) on (Mon, Wed, Fri, Sat) and Take 1 tablet (5 mg) on (Sun Tues, Thurs). What changed:  See the new instructions.      Allergies  Allergen Reactions  . Amiodarone Nausea And Vomiting  . Penicillins Hives, Swelling and Rash   Contact information for after-discharge care    Destination     [facility] .   Specialty:  [place] information: [place] [phone]               The results of significant diagnostics from this hospitalization (including imaging, microbiology, ancillary and laboratory) are listed below for reference.    Significant Diagnostic Studies: Dg Chest 2 View  Result Date: 08/08/2016 CLINICAL DATA:  83 year old male with worsening generalized edema. Initial encounter. EXAM: CHEST  2 VIEW COMPARISON:  Chest radiographs 03/12/2016 and earlier. FINDINGS: Semi upright AP and lateral views of the chest. Mildly increased basilar predominant pulmonary interstitial markings compared to 2017. Stable lung volumes. Stable mild cardiomegaly. Stable left chest stool lead cardiac pacemaker. Calcified aortic atherosclerosis. Other mediastinal contours are within normal limits. No pneumothorax. No pleural effusion. No confluent pulmonary opacity. Osteopenia. No acute osseous abnormality  identified. Negative visible bowel gas pattern. IMPRESSION: 1. Basilar predominant increased pulmonary vascularity compatible with mild or developing interstitial edema. No pleural effusion. 2. Calcified aortic atherosclerosis. Electronically Signed   By: Genevie Ann M.D.   On: 08/08/2016 19:40    Microbiology: No results found for this or any previous visit (from the past 240 hour(s)).   Labs: Basic Metabolic Panel:  Recent Labs Lab 08/09/16 0530 08/10/16 0520 08/11/16 0457 08/12/16 0416 08/13/16 0613  NA 138 136 136 134* 135  K 3.3* 3.5 3.8 3.7 3.7  CL 99* 95* 99* 98* 98*  CO2 34* 33* 30 30 31   GLUCOSE 94 92 98 104* 94  BUN 15 18 23* 21* 21*  CREATININE 0.53* 0.62 0.63 0.64 0.59*  CALCIUM 8.4* 8.6* 8.4* 8.4* 8.6*   Liver Function Tests: No results for input(s): AST, ALT, ALKPHOS, BILITOT, PROT, ALBUMIN in the last 168 hours. No results for input(s): LIPASE, AMYLASE in the last 168  hours. No results for input(s): AMMONIA in the last 168 hours. CBC:  Recent Labs Lab 08/08/16 1935 08/09/16 0530 08/13/16 0613  WBC 7.9 7.7 7.0  NEUTROABS 3.1 2.5  --   HGB 9.4* 9.3* 10.0*  HCT 28.9* 29.0* 30.8*  MCV 92.9 90.9 88.8  PLT 341 361 355   Cardiac Enzymes: No results for input(s): CKTOTAL, CKMB, CKMBINDEX, TROPONINI in the last 168 hours. BNP: BNP (last 3 results)  Recent Labs  08/08/16 1935  BNP 559.5*    ProBNP (last 3 results) No results for input(s): PROBNP in the last 8760 hours.  CBG: No results for input(s): GLUCAP in the last 168 hours.     Signed:  Annita Brod, MD [facility] 08/13/2016, 12:01 PM When the patient

## 2016-08-13 NOTE — Discharge Summary (Signed)
Discharge Summary  Steve Norris MBW:[id number] DOB: Jan 28, 1930  PCP: Horatio Pel, MD  Admit date: 08/08/2016 Discharge date: 08/13/2016  Time spent: 25 minutes   Recommendations for Outpatient Follow-up:  1. Patient being discharged to [facility]   Discharge Diagnoses:  Active Hospital Problems   Diagnosis Date Noted  . CHF exacerbation ([place]) 08/08/2016  . Acute diastolic CHF (congestive heart failure) ([place])   . Pressure injury of skin 08/09/2016  . BPH (benign prostatic hyperplasia) 08/09/2016  . HTN (hypertension) 08/09/2016  . Atrial fibrillation with rapid ventricular response ([place]) 11/04/2014  . Long term current use of anticoagulant therapy 07/14/2012  . COPD (chronic obstructive pulmonary disease) ([place]) 02/23/2012  . S/P placement of cardiac pacemaker 02/23/2012    Resolved Hospital Problems   Diagnosis Date Noted Date Resolved  No resolved problems to display.    Discharge Condition: Improved, being discharged to skilled nursing facility   Diet recommendation: Heart healthy   Vitals:   08/12/16 2143 08/13/16 0649  BP: (!) 105/52 (!) 107/54  Pulse: 63 (!) 59  Resp: 14 14  Temp: 97.9 F (36.6 C) 98.2 F (36.8 C)    History of present illness:  83 year old male with past mental history of hypertension, atrial fibrillation, sick sinus syndrome status post pacemaker and COPD admitted on 4/13 after coming in for one week of progressively worsening dyspnea on exertion and found to be in CHF.   Hospital Course:  Principal Problem:   Acute diastolic heart failure [facility]): Patient started on IV Lasix. BNP elevated at 560. Echocardiogram done 4/14 noted grade 2 diastolic dysfunction. Patient tolerated well. His weight on admission in the emergency room was 168 pounds and arrival to floor was 160 and by day of discharge he was down to 150. Active Problems:   COPD (chronic obstructive pulmonary disease) ([place]): Stable   S/P placement of  cardiac pacemaker   Long term current use of anticoagulant therapy   Atrial fibrillation with rapid ventricular response ([place]): Once diuresed, heart rate normal. Discharged on sotalol. Chads 2 score of 6. He is on Coumadin and by day of discharge, INR 2.28. Status post pacemaker   Pressure injury of skin   BPH (benign prostatic hyperplasia): Stable continue on Flomax   HTN (hypertension): Blood pressure stable on discharge. Prior to admission, patient was living alone, but with help of AIDS in the morning and evening. With his decreased mobility, patient and son felt like he was not able to care for himself without 24 hour assistance. He was value by physical therapy and was amenable to going to a skilled nursing facility.     Procedures:  Echocardiogram done 0/17: Grade 2 diastolic dysfunction  Consultations:  None   Discharge Exam: BP (!) 107/54 (BP Location: Left Arm)   Pulse (!) 59   Temp 98.2 F (36.8 C) (Oral)   Resp 14   Ht 5\' 9"  (1.753 m)   Wt 68.2 kg (150 lb 5.7 oz)   SpO2 92%   BMI 22.20 kg/m   General: Alert and oriented 3, no acute distress  Cardiovascular: Regular rate and rhythm, S1-S2 Respiratory: Clear to auscultation bilaterally   Discharge Instructions You were cared for by a hospitalist during your hospital stay. If you have any questions about your discharge medications or the care you received while you were in the hospital after you are discharged, you can call the unit and asked to speak with the hospitalist on call if the hospitalist that took care  of you is not available. Once you are discharged, your primary care physician will handle any further medical issues. Please note that NO REFILLS for any discharge medications will be authorized once you are discharged, as it is imperative that you return to your primary care physician (or establish a relationship with a primary care physician if you do not have one) for your aftercare needs so that they can  reassess your need for medications and monitor your lab values.  Discharge Instructions    Diet - low sodium heart healthy    Complete by:  As directed    Increase activity slowly    Complete by:  As directed      Allergies as of 08/13/2016      Reactions   Amiodarone Nausea And Vomiting   Penicillins Hives, Swelling, Rash      Medication List    TAKE these medications   finasteride 5 MG tablet Commonly known as:  PROSCAR Take 5 mg by mouth at bedtime.   furosemide 20 MG tablet Commonly known as:  LASIX Take 1 tablet (20 mg total) by mouth daily.   nebivolol 5 MG tablet Commonly known as:  BYSTOLIC Take 1 tablet (5 mg total) by mouth daily.   neomycin-bacitracin-polymyxin Oint Commonly known as:  NEOSPORIN Apply 1 application topically daily as needed for wound care. Reported on 08/31/2015   nitroGLYCERIN 0.4 MG SL tablet Commonly known as:  NITROSTAT Place 0.4 mg under the tongue every 5 (five) minutes as needed for chest pain.   potassium chloride SA 20 MEQ tablet Commonly known as:  K-DUR,KLOR-CON take 1 tablet by mouth once daily   rosuvastatin 20 MG tablet Commonly known as:  CRESTOR 20 mg daily.   SENNA-S 8.6-50 MG tablet Generic drug:  senna-docusate Take 1 tablet by mouth at bedtime.   sertraline 50 MG tablet Commonly known as:  ZOLOFT Take 1 tablet by mouth daily.   sotalol 80 MG tablet Commonly known as:  BETAPACE take 1 tablet by mouth every 12 hours   tamsulosin 0.4 MG Caps capsule Commonly known as:  FLOMAX Take 1 capsule by mouth daily.   temazepam 30 MG capsule Commonly known as:  RESTORIL Take 30 mg by mouth at bedtime.   warfarin 5 MG tablet Commonly known as:  COUMADIN Take 0.5 tablet (2.5 mg) on (Mon, Wed, Fri, Sat) and Take 1 tablet (5 mg) on (Sun Tues, Thurs). What changed:  See the new instructions.      Allergies  Allergen Reactions  . Amiodarone Nausea And Vomiting  . Penicillins Hives, Swelling and Rash   Contact  information for after-discharge care    Destination    [facility] .   Specialty:  [place] information: [place] [phone]               The results of significant diagnostics from this hospitalization (including imaging, microbiology, ancillary and laboratory) are listed below for reference.    Significant Diagnostic Studies: Dg Chest 2 View  Result Date: 08/08/2016 CLINICAL DATA:  83 year old male with worsening generalized edema. Initial encounter. EXAM: CHEST  2 VIEW COMPARISON:  Chest radiographs 03/12/2016 and earlier. FINDINGS: Semi upright AP and lateral views of the chest. Mildly increased basilar predominant pulmonary interstitial markings compared to 2017. Stable lung volumes. Stable mild cardiomegaly. Stable left chest stool lead cardiac pacemaker. Calcified aortic atherosclerosis. Other mediastinal contours are within normal limits. No pneumothorax. No pleural  effusion. No confluent pulmonary opacity. Osteopenia. No acute osseous abnormality identified. Negative visible bowel gas pattern. IMPRESSION: 1. Basilar predominant increased pulmonary vascularity compatible with mild or developing interstitial edema. No pleural effusion. 2. Calcified aortic atherosclerosis. Electronically Signed   By: Genevie Ann M.D.   On: 08/08/2016 19:40    Microbiology: No results found for this or any previous visit (from the past 240 hour(s)).   Labs: Basic Metabolic Panel:  Recent Labs Lab 08/09/16 0530 08/10/16 0520 08/11/16 0457 08/12/16 0416 08/13/16 0613  NA 138 136 136 134* 135  K 3.3* 3.5 3.8 3.7 3.7  CL 99* 95* 99* 98* 98*  CO2 34* 33* 30 30 31   GLUCOSE 94 92 98 104* 94  BUN 15 18 23* 21* 21*  CREATININE 0.53* 0.62 0.63 0.64 0.59*  CALCIUM 8.4* 8.6* 8.4* 8.4* 8.6*   Liver Function Tests: No results for input(s): AST, ALT, ALKPHOS, BILITOT, PROT, ALBUMIN in the last 168  hours. No results for input(s): LIPASE, AMYLASE in the last 168 hours. No results for input(s): AMMONIA in the last 168 hours. CBC:  Recent Labs Lab 08/08/16 1935 08/09/16 0530 08/13/16 0613  WBC 7.9 7.7 7.0  NEUTROABS 3.1 2.5  --   HGB 9.4* 9.3* 10.0*  HCT 28.9* 29.0* 30.8*  MCV 92.9 90.9 88.8  PLT 341 361 355   Cardiac Enzymes: No results for input(s): CKTOTAL, CKMB, CKMBINDEX, TROPONINI in the last 168 hours. BNP: BNP (last 3 results)  Recent Labs  08/08/16 1935  BNP 559.5*    ProBNP (last 3 results) No results for input(s): PROBNP in the last 8760 hours.  CBG: No results for input(s): GLUCAP in the last 168 hours.     Signed:  Annita Brod, MD [facility] 08/13/2016, 12:30 PM When the patient

## 2016-08-13 NOTE — Progress Notes (Signed)
ANTICOAGULATION CONSULT NOTE - Follow Up Consult  Pharmacy Consult for warfarin Indication: hx atrial fibrillation  Allergies  Allergen Reactions  . Amiodarone Nausea And Vomiting  . Penicillins Hives, Swelling and Rash    Patient Measurements: Height: 5\' 9"  (175.3 cm) Weight: 150 lb 5.7 oz (68.2 kg) IBW/kg (Calculated) : 70.7 Heparin Dosing Weight:   Vital Signs: Temp: 98.2 F (36.8 C) (04/18 0649) Temp Source: Oral (04/18 0649) BP: 107/54 (04/18 0649) Pulse Rate: 59 (04/18 0649)  Labs:  Recent Labs  08/11/16 0457 08/12/16 0416 08/13/16 0613  HGB  --   --  10.0*  HCT  --   --  30.8*  PLT  --   --  355  LABPROT 20.5* 22.5* 25.5*  INR 1.73 1.94 2.28  CREATININE 0.63 0.64 0.59*    Estimated Creatinine Clearance: 65.1 mL/min (A) (by C-G formula based on SCr of 0.59 mg/dL (L)).   Medications:  Home warfarin regimen: 2.5 mg daily except 5 mg on TTSun (last dose on 4/12 PTA)  Assessment: Patient's an 83 y.o M with hx heart failure and Afib on warfarin PTA, presented to the ED on 08/08/16 with c/o leg swelling and SOB.  Warfarin resumed on admission.  Today, 08/13/2016: - INR therapeutic at 2.28 - CBC stable, Hgb 10 - No bleeding documented - Tolerating regular diet - No significant drug-drug intxns   Goal of Therapy:  INR 2-3 Monitor platelets by anticoagulation protocol: Yes   Plan:  - Warfarin 2.5 mg PO x1 today per home dose - Daily INR while inpatient - Monitor for s/s bleeding  Peggyann Juba, PharmD, BCPS Pager: [phone] 08/13/2016,8:41 AM

## 2016-08-13 NOTE — Clinical Social Work Placement (Signed)
Patient received and accepted bed offer at [facility]. Patient's RN can call report to [phone], patient going to room 101B. [facility] contacted, patient's family aware.   CLINICAL SOCIAL WORK PLACEMENT  NOTE  Date:  08/13/2016  Patient Details  Name: Steve Norris MRN: [id number] Date of Birth: 1929/12/19  Clinical Social Work is seeking post-discharge placement for this patient at the [place] level of care (*CSW will initial, date and re-position this form in  chart as items are completed):  Yes   Patient/family provided with [place] Work Department's list of facilities offering this level of care within the geographic area requested by the patient (or if unable, by the patient's family).  Yes   Patient/family informed of their freedom to choose among providers that offer the needed level of care, that participate in Medicare, Medicaid or managed care program needed by the patient, have an available bed and are willing to accept the patient.  Yes   Patient/family informed of [facility]'s ownership interest in [facility] and [facility], as well as of the fact that they are under no obligation to receive care at these facilities.  PASRR submitted to EDS on 08/11/16     PASRR number received on 08/11/16     Existing PASRR number confirmed on       FL2 transmitted to all facilities in geographic area requested by pt/family on 08/11/16     FL2 transmitted to all facilities within larger geographic area on       Patient informed that his/her managed care company has contracts with or will negotiate with certain facilities, including the following:        Yes   Patient/family informed of bed offers received.  Patient chooses bed at [facility] [place]     Physician recommends and patient chooses bed at      Patient to be transferred to [place] [place] on 08/13/16.  Patient to be transferred to facility by [facility]      Patient family notified on 08/13/16 of transfer.  Name of family member notified:  Kingdavid Leinbach (son)     PHYSICIAN       Additional Comment:    _______________________________________________ Burnis Medin, LCSW 08/13/2016, 12:36 PM

## 2016-08-19 ENCOUNTER — Telehealth: Payer: Self-pay | Admitting: *Deleted

## 2016-08-19 DIAGNOSIS — L89323 Pressure ulcer of left buttock, stage 3: Secondary | ICD-10-CM | POA: Diagnosis not present

## 2016-08-19 NOTE — Telephone Encounter (Signed)
Left vm for patients son if he should reschedule fathers appt. Rn left vm its his choice to reschedule at any time. If patient is still stable, he can follow up with PCP. Pts son can call back to r/s if his father has any new neurological systems.

## 2016-08-21 ENCOUNTER — Ambulatory Visit: Payer: Self-pay | Admitting: Neurology

## 2016-08-26 DIAGNOSIS — M6281 Muscle weakness (generalized): Secondary | ICD-10-CM | POA: Diagnosis not present

## 2016-08-26 DIAGNOSIS — J449 Chronic obstructive pulmonary disease, unspecified: Secondary | ICD-10-CM | POA: Diagnosis not present

## 2016-08-26 DIAGNOSIS — I10 Essential (primary) hypertension: Secondary | ICD-10-CM | POA: Diagnosis not present

## 2016-08-26 DIAGNOSIS — I5031 Acute diastolic (congestive) heart failure: Secondary | ICD-10-CM | POA: Diagnosis not present

## 2016-08-26 DIAGNOSIS — R41841 Cognitive communication deficit: Secondary | ICD-10-CM | POA: Diagnosis not present

## 2016-08-26 DIAGNOSIS — R278 Other lack of coordination: Secondary | ICD-10-CM | POA: Diagnosis not present

## 2016-08-26 DIAGNOSIS — R609 Edema, unspecified: Secondary | ICD-10-CM | POA: Diagnosis not present

## 2016-08-26 DIAGNOSIS — R2681 Unsteadiness on feet: Secondary | ICD-10-CM | POA: Diagnosis not present

## 2016-08-26 DIAGNOSIS — R0609 Other forms of dyspnea: Secondary | ICD-10-CM | POA: Diagnosis not present

## 2016-09-01 DIAGNOSIS — M6281 Muscle weakness (generalized): Secondary | ICD-10-CM | POA: Diagnosis not present

## 2016-09-01 DIAGNOSIS — Z7901 Long term (current) use of anticoagulants: Secondary | ICD-10-CM | POA: Diagnosis not present

## 2016-09-01 DIAGNOSIS — I4891 Unspecified atrial fibrillation: Secondary | ICD-10-CM | POA: Diagnosis not present

## 2016-09-01 DIAGNOSIS — I509 Heart failure, unspecified: Secondary | ICD-10-CM | POA: Diagnosis not present

## 2016-09-01 DIAGNOSIS — J449 Chronic obstructive pulmonary disease, unspecified: Secondary | ICD-10-CM | POA: Diagnosis not present

## 2016-09-01 DIAGNOSIS — N400 Enlarged prostate without lower urinary tract symptoms: Secondary | ICD-10-CM | POA: Diagnosis not present

## 2016-09-01 DIAGNOSIS — K219 Gastro-esophageal reflux disease without esophagitis: Secondary | ICD-10-CM | POA: Diagnosis not present

## 2016-09-01 DIAGNOSIS — I110 Hypertensive heart disease with heart failure: Secondary | ICD-10-CM | POA: Diagnosis not present

## 2016-09-03 DIAGNOSIS — Z7901 Long term (current) use of anticoagulants: Secondary | ICD-10-CM | POA: Diagnosis not present

## 2016-09-03 DIAGNOSIS — I4891 Unspecified atrial fibrillation: Secondary | ICD-10-CM | POA: Diagnosis not present

## 2016-09-03 DIAGNOSIS — I509 Heart failure, unspecified: Secondary | ICD-10-CM | POA: Diagnosis not present

## 2016-09-03 DIAGNOSIS — M6281 Muscle weakness (generalized): Secondary | ICD-10-CM | POA: Diagnosis not present

## 2016-09-03 DIAGNOSIS — N400 Enlarged prostate without lower urinary tract symptoms: Secondary | ICD-10-CM | POA: Diagnosis not present

## 2016-09-03 DIAGNOSIS — K219 Gastro-esophageal reflux disease without esophagitis: Secondary | ICD-10-CM | POA: Diagnosis not present

## 2016-09-03 DIAGNOSIS — J449 Chronic obstructive pulmonary disease, unspecified: Secondary | ICD-10-CM | POA: Diagnosis not present

## 2016-09-03 DIAGNOSIS — I110 Hypertensive heart disease with heart failure: Secondary | ICD-10-CM | POA: Diagnosis not present

## 2016-09-09 DIAGNOSIS — R600 Localized edema: Secondary | ICD-10-CM | POA: Diagnosis not present

## 2016-09-18 DIAGNOSIS — R5383 Other fatigue: Secondary | ICD-10-CM | POA: Diagnosis not present

## 2016-09-18 DIAGNOSIS — Z862 Personal history of diseases of the blood and blood-forming organs and certain disorders involving the immune mechanism: Secondary | ICD-10-CM | POA: Diagnosis not present

## 2016-09-18 DIAGNOSIS — R5382 Chronic fatigue, unspecified: Secondary | ICD-10-CM | POA: Diagnosis not present

## 2016-09-19 ENCOUNTER — Telehealth: Payer: Self-pay | Admitting: Student

## 2016-09-19 DIAGNOSIS — R2681 Unsteadiness on feet: Secondary | ICD-10-CM | POA: Diagnosis not present

## 2016-09-19 DIAGNOSIS — I129 Hypertensive chronic kidney disease with stage 1 through stage 4 chronic kidney disease, or unspecified chronic kidney disease: Secondary | ICD-10-CM | POA: Diagnosis not present

## 2016-09-19 DIAGNOSIS — N189 Chronic kidney disease, unspecified: Secondary | ICD-10-CM | POA: Diagnosis not present

## 2016-09-19 DIAGNOSIS — R42 Dizziness and giddiness: Secondary | ICD-10-CM | POA: Diagnosis not present

## 2016-09-19 NOTE — Telephone Encounter (Signed)
Received records from [facility] for appointment on 09/30/16 with Bernerd Pho, [place].  Records put with Brittany's schedule for 09/30/16. lp

## 2016-09-23 ENCOUNTER — Telehealth: Payer: Self-pay | Admitting: Cardiology

## 2016-09-23 ENCOUNTER — Ambulatory Visit: Payer: PPO | Admitting: *Deleted

## 2016-09-23 DIAGNOSIS — M6281 Muscle weakness (generalized): Secondary | ICD-10-CM | POA: Diagnosis not present

## 2016-09-23 DIAGNOSIS — N400 Enlarged prostate without lower urinary tract symptoms: Secondary | ICD-10-CM | POA: Diagnosis not present

## 2016-09-23 DIAGNOSIS — I4891 Unspecified atrial fibrillation: Secondary | ICD-10-CM | POA: Diagnosis not present

## 2016-09-23 DIAGNOSIS — J449 Chronic obstructive pulmonary disease, unspecified: Secondary | ICD-10-CM | POA: Diagnosis not present

## 2016-09-23 DIAGNOSIS — K219 Gastro-esophageal reflux disease without esophagitis: Secondary | ICD-10-CM | POA: Diagnosis not present

## 2016-09-23 DIAGNOSIS — I110 Hypertensive heart disease with heart failure: Secondary | ICD-10-CM | POA: Diagnosis not present

## 2016-09-23 DIAGNOSIS — Z7901 Long term (current) use of anticoagulants: Secondary | ICD-10-CM | POA: Diagnosis not present

## 2016-09-23 DIAGNOSIS — I509 Heart failure, unspecified: Secondary | ICD-10-CM | POA: Diagnosis not present

## 2016-09-23 NOTE — Telephone Encounter (Signed)
Confirmed remote transmission w/ pt son. Pt son stated that pt is now in a Assisted Living. I informed pt son that the home monitor can be taking to the facility. I informed pt son that I would send pt a cell adapter. That way pt will not need a land line phone to send remote transmission. Pt son verbalized understanding.

## 2016-09-25 NOTE — Progress Notes (Signed)
Remote pacemaker transmission.   

## 2016-09-26 ENCOUNTER — Telehealth: Payer: Self-pay | Admitting: Cardiovascular Disease

## 2016-09-26 ENCOUNTER — Encounter: Payer: Self-pay | Admitting: Cardiology

## 2016-09-26 NOTE — Telephone Encounter (Signed)
New message    Pt son is calling about pt coumadin checks. He has some questions regarding pt.

## 2016-09-26 NOTE — Telephone Encounter (Signed)
Talked to Steve Norris. His father Steve Norris) is interested on possibly changing warfarin to NOAC.   No contraindications to NOACs noted. Patient may be a good candidate for Apixaban 5mg  BID. Need to check INR prior to initiating Apixaban.   Will check INR on 09/30/16 during cardiologist f/u, then will recommendations to patient.

## 2016-09-29 ENCOUNTER — Telehealth: Payer: Self-pay | Admitting: Cardiovascular Disease

## 2016-09-29 NOTE — Telephone Encounter (Signed)
New message  Pt son call stating pt will being moving to a different assistant living location and would like to know if there it an alternative medication for pt to take for coumadin. Pt son ask if there is a cheaper medication. Please call back to discuss

## 2016-09-29 NOTE — Telephone Encounter (Signed)
Confirmed patient has appt tomorrow, will switch to Eliquis to cut costs for skilled nursing (INR monitoring changes his cost structure at the facility)

## 2016-09-29 NOTE — Progress Notes (Signed)
Cardiology Office Note    Date:  09/30/2016   ID:  [name], [name] 1929-09-29, MRN [id number]  PCP:  Deland Pretty, MD  Cardiologist: Dr. Sallyanne Kuster   Chief Complaint  Patient presents with  . Hospitalization Follow-up    History of Present Illness:    Steve Norris is a 83 y.o. male with past medical history of sinus node dysfunction (s/p PPM placement in 2011), PAF (on coumadin), CAD (s/p stent placement in 2006 to the LAD and RCA), COPD, dementia and anemia who presents to the office today for hospital follow-up.   He was last examined by Dr. Sallyanne Kuster in 02/2016 and reported having low back pain which was decreasing his QOL. He denied any chest pain or worsening dyspnea with exertion. His device was interrogated at that time and functioning well.   Was admitted in 07/2016 for an acute diastolic CHF exacerbation. An echocardiogram was performed and showed a preserved EF of 60-65% with Grade 2 DD. He was diuresed with IV Lasix and placed on Lasix 20mg  daily.   In talking with the patient today, he reports overall doing well from a cardiac perspective. He denies any recent chest discomfort, palpitations, dyspnea on exertion, orthopnea, PND, or lower extremity edema. He is accompanied by Juliann Pulse who is Clinical biochemist at [employer] [employer] (his current living facility). She reports that he spends the majority of the day in bed but does walk to the dining room for meals and participates in social events. He has to stop after approximately 50 feet to rest but is able to continue walking after this. No reported symptoms with this.   His son has reached out to our office and wishes for him to be switched from Coumadin to Eliquis in an effort to avoid frequent office visits and INR checks. He denies any evidence of active bleeding while being on Coumadin.  Past Medical History:  Diagnosis Date  . Arthritis   . Asthma   . Barrett's esophagus 03-19-2009   EGD  . CAD (coronary artery disease)    a.  s/p stent placement in 2006 to the LAD and RCA  . Cataract    REMOVED  . COPD (chronic obstructive pulmonary disease) ([place])   . Diverticulosis 03-19-2009   Colonoscopy   . Erosive esophagitis 03-19-2009   EGD  . GERD (gastroesophageal reflux disease)   . Hiatal hernia 03-19-2009   EGD  . Hyperlipidemia   . Hypertension   . Internal hemorrhoids 03-19-2009   Colonoscopy  . Paroxysmal atrial fibrillation (HCC)   . Sinus node dysfunction (HCC)    a. s/p PPM placement in 2011    Past Surgical History:  Procedure Laterality Date  . CARDIAC CATHETERIZATION  02/12/2009   mild ostial left main disease,patent LAD & RCA stents   . CARDIOVERSION N/A 11/06/2014   Procedure: CARDIOVERSION;  Surgeon: [name], MD;  Location[facility] ENDOSCOPY;  Service: Cardiovascular;  Laterality: N/A;  . CARDIOVERSION N/A 08/02/2015   Procedure: CARDIOVERSION;  Surgeon: Sueanne Margarita, MD;  Location: [facility] ENDOSCOPY;  Service: Cardiovascular;  Laterality: N/A;  . CATARACT EXTRACTION    . COLONOSCOPY    . CORONARY STENT PLACEMENT  2006   Right Coronary Artery Cypher stents placed 2006  . HEMORRHOID SURGERY    . LEFT HEART CATHETERIZATION WITH CORONARY ANGIOGRAM N/A 07/01/2013   Procedure: LEFT HEART CATHETERIZATION WITH CORONARY ANGIOGRAM;  Surgeon: Sanda Klein, MD;  Location: [facility] CATH LAB;  Service: Cardiovascular;  Laterality: N/A;  .  NM MYOCAR PERF WALL MOTION  08/14/2008   no significant ischemia EF 64%  . PACEMAKER INSERTION  07/25/2009   [employer] Accent  . TEE WITHOUT CARDIOVERSION N/A 08/02/2015   Procedure: TRANSESOPHAGEAL ECHOCARDIOGRAM (TEE);  Surgeon: Sueanne Margarita, MD;  Location: [facility] ENDOSCOPY;  Service: Cardiovascular;  Laterality: N/A;  . TONSILLECTOMY    . US ECHOCARDIOGRAPHY  07/12/2009   borderline LA enlargement,mild mitral annular ca+, AOV mildly sclerotic,trace AI.  Marland Kitchen VASECTOMY      Current Medications: Outpatient Medications Prior to Visit  Medication Sig Dispense Refill  . finasteride  (PROSCAR) 5 MG tablet Take 5 mg by mouth at bedtime.     . furosemide (LASIX) 20 MG tablet Take 1 tablet (20 mg total) by mouth daily. 30 tablet 6  . nebivolol (BYSTOLIC) 5 MG tablet Take 1 tablet (5 mg total) by mouth daily. 90 tablet 2  . neomycin-bacitracin-polymyxin (NEOSPORIN) OINT Apply 1 application topically daily as needed for wound care. Reported on 08/31/2015    . nitroGLYCERIN (NITROSTAT) 0.4 MG SL tablet Place 0.4 mg under the tongue every 5 (five) minutes as needed for chest pain.     . potassium chloride SA (K-DUR,KLOR-CON) 20 MEQ tablet take 1 tablet by mouth once daily 30 tablet 6  . rosuvastatin (CRESTOR) 20 MG tablet 20 mg daily.     Marland Kitchen senna-docusate (SENNA-S) 8.6-50 MG per tablet Take 1 tablet by mouth at bedtime.    . sertraline (ZOLOFT) 50 MG tablet Take 1 tablet by mouth daily.    . sotalol (BETAPACE) 80 MG tablet take 1 tablet by mouth every 12 hours 60 tablet 5  . tamsulosin (FLOMAX) 0.4 MG CAPS capsule Take 1 capsule by mouth daily.    . temazepam (RESTORIL) 30 MG capsule Take 30 mg by mouth at bedtime.    Marland Kitchen warfarin (COUMADIN) 5 MG tablet Take 0.5 tablet (2.5 mg) on (Mon, Wed, Fri, Sat) and Take 1 tablet (5 mg) on (Sun Tues, Thurs). 90 tablet 1   No facility-administered medications prior to visit.      Allergies:   Amiodarone and Penicillins   Social History   Social History  . Marital status: Married    Spouse name: N/A  . Number of children: N/A  . Years of education: N/A   Occupational History  . Retired     Social History Main Topics  . Smoking status: Former Research scientist (life sciences)  . Smokeless tobacco: Never Used     Comment: Quit smoking 1996  . Alcohol use No  . Drug use: No  . Sexual activity: Not Asked   Other Topics Concern  . None   Social History Narrative   Daily caffeine      Family History:  The patient's family history includes Heart disease in his father and mother.   Review of Systems:   Please see the history of present illness.      General:  No chills, fever, night sweats or weight changes. Positive for fatigue.  Cardiovascular:  No chest pain, dyspnea on exertion, edema, orthopnea, palpitations, paroxysmal nocturnal dyspnea. Dermatological: No rash, lesions/masses Respiratory: No cough, dyspnea Urologic: No hematuria, dysuria Abdominal:   No nausea, vomiting, diarrhea, bright red blood per rectum, melena, or hematemesis Neurologic:  No visual changes or wkns. Positive for changes in mental status. All other systems reviewed and are otherwise negative except as noted above.   Physical Exam:    VS:  BP 119/64   Pulse 60   Ht 5\' 9"  (1.753  m)   Wt 157 lb 3.2 oz (71.3 kg)   BMI 23.21 kg/m    General: Well developed, well nourished Caucasian male appearing in no acute distress. Head: Normocephalic, atraumatic, sclera non-icteric, no xanthomas, nares are without discharge.  Neck: No carotid bruits. JVD not elevated.  Lungs: Respirations regular and unlabored, mild expiratory wheezing along upper lung fields. No rales appreciated.  Heart: Regular rate and rhythm. No S3 or S4.  No murmur, no rubs, or gallops appreciated. Abdomen: Soft, non-tender, non-distended with normoactive bowel sounds. No hepatomegaly. No rebound/guarding. No obvious abdominal masses. Msk:  Strength and tone appear normal for age. No joint deformities or effusions. Extremities: No clubbing or cyanosis. No edema.  Distal pedal pulses are 2+ bilaterally. Neuro: Alert and oriented X 2 (person, place). Moves all extremities spontaneously. No focal deficits noted. Psych:  Responds to questions appropriately with a normal affect. Skin: No rashes or lesions noted  Wt Readings from Last 3 Encounters:  09/30/16 157 lb 3.2 oz (71.3 kg)  08/13/16 150 lb 5.7 oz (68.2 kg)  04/30/16 155 lb 12.8 oz (70.7 kg)     Studies/Labs Reviewed:   EKG:  EKG is not ordered today.  Recent Labs: 12/13/2015: ALT 15 04/30/2016: TSH 2.620 08/08/2016: B Natriuretic  Peptide 559.5 08/13/2016: BUN 21; Creatinine, Ser 0.59; Hemoglobin 10.0; Platelets 355; Potassium 3.7; Sodium 135   Lipid Panel No results found for: CHOL, TRIG, HDL, CHOLHDL, VLDL, LDLCALC, LDLDIRECT  Additional studies/ records that were reviewed today include:   Echocardiogram: 08/09/2016 Study Conclusions  - Left ventricle: The cavity size was normal. Wall thickness was   increased in a pattern of mild LVH. Systolic function was normal.   The estimated ejection fraction was in the range of 60% to 65%.   Wall motion was normal; there were no regional wall motion   abnormalities. Features are consistent with a pseudonormal left   ventricular filling pattern, with concomitant abnormal relaxation   and increased filling pressure (grade 2 diastolic dysfunction). - Left atrium: The atrium was mildly to moderately dilated. - Right atrium: The atrium was moderately to severely dilated.  Assessment:    1. Paroxysmal atrial fibrillation (HCC)   2. Long term current use of anticoagulant therapy   3. Chronic diastolic CHF (congestive heart failure) ([place])   4. Coronary artery disease involving native coronary artery of native heart without angina pectoris   5. SSS (sick sinus syndrome) (HCC)   6. Chronic obstructive pulmonary disease, unspecified COPD type ([place])      Plan:   In order of problems listed above:  1. Paroxysmal Atrial Fibrillation/ Use of Long-term Anticoagulation - he denies any recent episodes of palpitations or acute dyspnea on exertion.  - This patients CHA2DS2-VASc Score and unadjusted Ischemic Stroke Rate (% per year) is equal to 4.8 % stroke rate/year from a score of 4 (HTN, Vascular, Age (2)). He denies any evidence of active bleeding. The patient and his family wish to switch from Coumadin to a NOAC. Evaluated by Pharmacy as well today and will stop Coumadin and start Eliquis 5mg  BID tomorrow.  - continue Sotalol 80mg  BID and Bystolic 5mg  daily.   2. Chronic  Diastolic CHF - echo from 53/6144 showed a preserved EF of 60-65% with Grade 2 DD. - he does not appear volume overloaded by physical examination. BP is well-controlled at 119/64.  - continue Lasix 20mg  daily.   3. CAD - s/p stent placement in 2006 to the LAD and RCA. - he  denies any recent chest pain or dyspnea on exertion. Recent echo showed a preserved EF with no regional WMA.  - continue BB and statin therapy. No ASA secondary to the need for anticoagulation.   4. Sinus node dysfunction - s/p PPM placement in 2011 - followed by Dr. Sallyanne Kuster. Most recent interrogation in 05/2016 showed good battery status with episodes of atrial tachycardia.    5. COPD - He does have mild expiratory wheezing on examination. - continue PRN Albuterol treatments. Continued follow-up with PCP was recommended.    Medication Adjustments/Labs and Tests Ordered: Current medicines are reviewed at length with the patient today.  Concerns regarding medicines are outlined above.  Medication changes, Labs and Tests ordered today are listed in the Patient Instructions below. Patient Instructions  Medication Instructions:  STOP COUMADIN START ELIQUIS TOMORROW (10-01-2016) 5MG  TWICE DAILY  If you need a refill on your cardiac medications before your next appointment, please call your pharmacy.  Follow-Up: Your physician wants you to follow-up in: [place].   Thank you for choosing [facility] at [facility], Erma Heritage, PA-C  09/30/2016 1:27 PM    [address], [place], [place]  [address] Phone: [phone]; Fax: [phone]  [address], [place], [place] [address] Phone: [phone]

## 2016-09-29 NOTE — Telephone Encounter (Signed)
LMOM

## 2016-09-30 ENCOUNTER — Encounter: Payer: Self-pay | Admitting: Student

## 2016-09-30 ENCOUNTER — Ambulatory Visit (INDEPENDENT_AMBULATORY_CARE_PROVIDER_SITE_OTHER): Payer: PPO | Admitting: Student

## 2016-09-30 ENCOUNTER — Ambulatory Visit (INDEPENDENT_AMBULATORY_CARE_PROVIDER_SITE_OTHER): Payer: PPO | Admitting: Pharmacist Clinician (PhC)/ Clinical Pharmacy Specialist

## 2016-09-30 VITALS — BP 119/64 | HR 60 | Ht 69.0 in | Wt 157.2 lb

## 2016-09-30 DIAGNOSIS — J449 Chronic obstructive pulmonary disease, unspecified: Secondary | ICD-10-CM

## 2016-09-30 DIAGNOSIS — I2510 Atherosclerotic heart disease of native coronary artery without angina pectoris: Secondary | ICD-10-CM

## 2016-09-30 DIAGNOSIS — I5032 Chronic diastolic (congestive) heart failure: Secondary | ICD-10-CM

## 2016-09-30 DIAGNOSIS — I495 Sick sinus syndrome: Secondary | ICD-10-CM

## 2016-09-30 DIAGNOSIS — Z7901 Long term (current) use of anticoagulants: Secondary | ICD-10-CM

## 2016-09-30 DIAGNOSIS — I480 Paroxysmal atrial fibrillation: Secondary | ICD-10-CM | POA: Diagnosis not present

## 2016-09-30 DIAGNOSIS — I4891 Unspecified atrial fibrillation: Secondary | ICD-10-CM | POA: Diagnosis not present

## 2016-09-30 LAB — POCT INR: INR: 2

## 2016-09-30 NOTE — Progress Notes (Signed)
Thanks MCr 

## 2016-09-30 NOTE — Patient Instructions (Signed)
Medication Instructions:  STOP COUMADIN START ELIQUIS TOMORROW (10-01-2016) 5MG  TWICE DAILY  If you need a refill on your cardiac medications before your next appointment, please call your pharmacy.  Follow-Up: Your physician wants you to follow-up in: [place].   Thank you for choosing [facility] at [facility]!!

## 2016-10-01 ENCOUNTER — Telehealth: Payer: Self-pay | Admitting: Cardiovascular Disease

## 2016-10-01 DIAGNOSIS — I509 Heart failure, unspecified: Secondary | ICD-10-CM | POA: Diagnosis not present

## 2016-10-01 DIAGNOSIS — I4891 Unspecified atrial fibrillation: Secondary | ICD-10-CM | POA: Diagnosis not present

## 2016-10-01 DIAGNOSIS — M6281 Muscle weakness (generalized): Secondary | ICD-10-CM | POA: Diagnosis not present

## 2016-10-01 DIAGNOSIS — Z7901 Long term (current) use of anticoagulants: Secondary | ICD-10-CM | POA: Diagnosis not present

## 2016-10-01 DIAGNOSIS — N400 Enlarged prostate without lower urinary tract symptoms: Secondary | ICD-10-CM | POA: Diagnosis not present

## 2016-10-01 DIAGNOSIS — J449 Chronic obstructive pulmonary disease, unspecified: Secondary | ICD-10-CM | POA: Diagnosis not present

## 2016-10-01 DIAGNOSIS — K219 Gastro-esophageal reflux disease without esophagitis: Secondary | ICD-10-CM | POA: Diagnosis not present

## 2016-10-01 DIAGNOSIS — I110 Hypertensive heart disease with heart failure: Secondary | ICD-10-CM | POA: Diagnosis not present

## 2016-10-01 NOTE — Telephone Encounter (Signed)
New Message    Tillie Rung from [facility] is calling for a clarification on his eliquis. He is moving into community tomorrow and their RN would like the order to say eliquis 5 mg 1 tab po bid. [phone]

## 2016-10-01 NOTE — Telephone Encounter (Signed)
Letter faxed.

## 2016-10-07 ENCOUNTER — Telehealth: Payer: Self-pay | Admitting: Cardiovascular Disease

## 2016-10-07 NOTE — Telephone Encounter (Signed)
New message       Pt is new to [facility].  His family does not want to get a phone line in his room therefore, pt cannot have pacemaker checks.  Calling to talk about other ways to check pacemaker

## 2016-10-07 NOTE — Telephone Encounter (Signed)
Spoke with [facility] [employer]. I will send a cellular adapter to her for Mr. Stamps home monitor today. ([address] [place], [place])

## 2016-10-14 DIAGNOSIS — F331 Major depressive disorder, recurrent, moderate: Secondary | ICD-10-CM | POA: Diagnosis not present

## 2016-10-20 ENCOUNTER — Encounter: Payer: PPO | Admitting: *Deleted

## 2016-10-20 ENCOUNTER — Telehealth: Payer: Self-pay | Admitting: Cardiology

## 2016-10-20 NOTE — Telephone Encounter (Signed)
Confirmed remote transmission w/ pt nurse.   

## 2016-10-24 ENCOUNTER — Encounter: Payer: Self-pay | Admitting: Cardiology

## 2016-10-28 DIAGNOSIS — J449 Chronic obstructive pulmonary disease, unspecified: Secondary | ICD-10-CM | POA: Diagnosis not present

## 2016-10-28 DIAGNOSIS — M6200 Separation of muscle (nontraumatic), unspecified site: Secondary | ICD-10-CM | POA: Diagnosis not present

## 2016-10-28 DIAGNOSIS — Z7901 Long term (current) use of anticoagulants: Secondary | ICD-10-CM | POA: Diagnosis not present

## 2016-10-28 DIAGNOSIS — I10 Essential (primary) hypertension: Secondary | ICD-10-CM | POA: Diagnosis not present

## 2016-10-28 DIAGNOSIS — N500 Atrophy of testis: Secondary | ICD-10-CM | POA: Diagnosis not present

## 2016-10-28 DIAGNOSIS — I4891 Unspecified atrial fibrillation: Secondary | ICD-10-CM | POA: Diagnosis not present

## 2016-10-28 DIAGNOSIS — I509 Heart failure, unspecified: Secondary | ICD-10-CM | POA: Diagnosis not present

## 2016-10-28 DIAGNOSIS — G301 Alzheimer's disease with late onset: Secondary | ICD-10-CM | POA: Diagnosis not present

## 2016-10-28 DIAGNOSIS — F331 Major depressive disorder, recurrent, moderate: Secondary | ICD-10-CM | POA: Diagnosis not present

## 2016-10-28 DIAGNOSIS — K219 Gastro-esophageal reflux disease without esophagitis: Secondary | ICD-10-CM | POA: Diagnosis not present

## 2016-11-06 DIAGNOSIS — Z961 Presence of intraocular lens: Secondary | ICD-10-CM | POA: Diagnosis not present

## 2016-11-06 DIAGNOSIS — H353131 Nonexudative age-related macular degeneration, bilateral, early dry stage: Secondary | ICD-10-CM | POA: Diagnosis not present

## 2016-11-06 DIAGNOSIS — H18413 Arcus senilis, bilateral: Secondary | ICD-10-CM | POA: Diagnosis not present

## 2016-11-06 DIAGNOSIS — F331 Major depressive disorder, recurrent, moderate: Secondary | ICD-10-CM | POA: Diagnosis not present

## 2016-11-06 DIAGNOSIS — H35033 Hypertensive retinopathy, bilateral: Secondary | ICD-10-CM | POA: Diagnosis not present

## 2016-11-06 DIAGNOSIS — H43813 Vitreous degeneration, bilateral: Secondary | ICD-10-CM | POA: Diagnosis not present

## 2016-11-11 DIAGNOSIS — F331 Major depressive disorder, recurrent, moderate: Secondary | ICD-10-CM | POA: Diagnosis not present

## 2016-11-11 DIAGNOSIS — G301 Alzheimer's disease with late onset: Secondary | ICD-10-CM | POA: Diagnosis not present

## 2016-11-26 DIAGNOSIS — G301 Alzheimer's disease with late onset: Secondary | ICD-10-CM | POA: Diagnosis not present

## 2016-11-26 DIAGNOSIS — F331 Major depressive disorder, recurrent, moderate: Secondary | ICD-10-CM | POA: Diagnosis not present

## 2016-12-09 DIAGNOSIS — G301 Alzheimer's disease with late onset: Secondary | ICD-10-CM | POA: Diagnosis not present

## 2016-12-09 DIAGNOSIS — F331 Major depressive disorder, recurrent, moderate: Secondary | ICD-10-CM | POA: Diagnosis not present

## 2016-12-17 DIAGNOSIS — Q845 Enlarged and hypertrophic nails: Secondary | ICD-10-CM | POA: Diagnosis not present

## 2016-12-17 DIAGNOSIS — L603 Nail dystrophy: Secondary | ICD-10-CM | POA: Diagnosis not present

## 2016-12-17 DIAGNOSIS — B351 Tinea unguium: Secondary | ICD-10-CM | POA: Diagnosis not present

## 2016-12-17 DIAGNOSIS — M2010 Hallux valgus (acquired), unspecified foot: Secondary | ICD-10-CM | POA: Diagnosis not present

## 2016-12-17 DIAGNOSIS — L853 Xerosis cutis: Secondary | ICD-10-CM | POA: Diagnosis not present

## 2016-12-23 ENCOUNTER — Emergency Department (HOSPITAL_COMMUNITY): Payer: PPO

## 2016-12-23 ENCOUNTER — Observation Stay (HOSPITAL_COMMUNITY): Payer: PPO

## 2016-12-23 ENCOUNTER — Inpatient Hospital Stay (HOSPITAL_COMMUNITY)
Admission: EM | Admit: 2016-12-23 | Discharge: 2016-12-25 | DRG: 101 | Disposition: A | Discharge status: Nursing Home (Includes ICF) | Payer: PPO | Attending: Internal Medicine | Admitting: Internal Medicine

## 2016-12-23 DIAGNOSIS — Z7901 Long term (current) use of anticoagulants: Secondary | ICD-10-CM | POA: Diagnosis not present

## 2016-12-23 DIAGNOSIS — I2510 Atherosclerotic heart disease of native coronary artery without angina pectoris: Secondary | ICD-10-CM | POA: Diagnosis present

## 2016-12-23 DIAGNOSIS — R740 Nonspecific elevation of levels of transaminase and lactic acid dehydrogenase [LDH]: Secondary | ICD-10-CM

## 2016-12-23 DIAGNOSIS — Z87891 Personal history of nicotine dependence: Secondary | ICD-10-CM

## 2016-12-23 DIAGNOSIS — R6889 Other general symptoms and signs: Secondary | ICD-10-CM | POA: Diagnosis not present

## 2016-12-23 DIAGNOSIS — E860 Dehydration: Secondary | ICD-10-CM | POA: Diagnosis present

## 2016-12-23 DIAGNOSIS — Z79899 Other long term (current) drug therapy: Secondary | ICD-10-CM

## 2016-12-23 DIAGNOSIS — I5032 Chronic diastolic (congestive) heart failure: Secondary | ICD-10-CM | POA: Diagnosis present

## 2016-12-23 DIAGNOSIS — G4089 Other seizures: Principal | ICD-10-CM | POA: Diagnosis present

## 2016-12-23 DIAGNOSIS — Z955 Presence of coronary angioplasty implant and graft: Secondary | ICD-10-CM

## 2016-12-23 DIAGNOSIS — E785 Hyperlipidemia, unspecified: Secondary | ICD-10-CM | POA: Diagnosis present

## 2016-12-23 DIAGNOSIS — H6692 Otitis media, unspecified, left ear: Secondary | ICD-10-CM | POA: Diagnosis present

## 2016-12-23 DIAGNOSIS — J449 Chronic obstructive pulmonary disease, unspecified: Secondary | ICD-10-CM | POA: Diagnosis present

## 2016-12-23 DIAGNOSIS — R4182 Altered mental status, unspecified: Secondary | ICD-10-CM

## 2016-12-23 DIAGNOSIS — J45909 Unspecified asthma, uncomplicated: Secondary | ICD-10-CM | POA: Diagnosis present

## 2016-12-23 DIAGNOSIS — F0390 Unspecified dementia without behavioral disturbance: Secondary | ICD-10-CM | POA: Diagnosis present

## 2016-12-23 DIAGNOSIS — R410 Disorientation, unspecified: Secondary | ICD-10-CM | POA: Diagnosis present

## 2016-12-23 DIAGNOSIS — I480 Paroxysmal atrial fibrillation: Secondary | ICD-10-CM | POA: Diagnosis present

## 2016-12-23 DIAGNOSIS — Z8249 Family history of ischemic heart disease and other diseases of the circulatory system: Secondary | ICD-10-CM | POA: Diagnosis not present

## 2016-12-23 DIAGNOSIS — I10 Essential (primary) hypertension: Secondary | ICD-10-CM

## 2016-12-23 DIAGNOSIS — I110 Hypertensive heart disease with heart failure: Secondary | ICD-10-CM | POA: Diagnosis present

## 2016-12-23 DIAGNOSIS — J4520 Mild intermittent asthma, uncomplicated: Secondary | ICD-10-CM | POA: Diagnosis not present

## 2016-12-23 DIAGNOSIS — Z950 Presence of cardiac pacemaker: Secondary | ICD-10-CM | POA: Diagnosis not present

## 2016-12-23 DIAGNOSIS — R4701 Aphasia: Secondary | ICD-10-CM | POA: Diagnosis present

## 2016-12-23 DIAGNOSIS — K219 Gastro-esophageal reflux disease without esophagitis: Secondary | ICD-10-CM | POA: Diagnosis present

## 2016-12-23 DIAGNOSIS — G9340 Encephalopathy, unspecified: Secondary | ICD-10-CM | POA: Diagnosis not present

## 2016-12-23 DIAGNOSIS — R569 Unspecified convulsions: Secondary | ICD-10-CM

## 2016-12-23 DIAGNOSIS — R471 Dysarthria and anarthria: Secondary | ICD-10-CM | POA: Diagnosis not present

## 2016-12-23 DIAGNOSIS — R7401 Elevation of levels of liver transaminase levels: Secondary | ICD-10-CM | POA: Diagnosis present

## 2016-12-23 DIAGNOSIS — R4020 Unspecified coma: Secondary | ICD-10-CM | POA: Diagnosis not present

## 2016-12-23 DIAGNOSIS — Z880 Allergy status to penicillin: Secondary | ICD-10-CM

## 2016-12-23 DIAGNOSIS — R404 Transient alteration of awareness: Secondary | ICD-10-CM | POA: Diagnosis not present

## 2016-12-23 LAB — URINALYSIS, ROUTINE W REFLEX MICROSCOPIC
BACTERIA UA: NONE SEEN
Bilirubin Urine: NEGATIVE
Glucose, UA: NEGATIVE mg/dL
Ketones, ur: NEGATIVE mg/dL
Leukocytes, UA: NEGATIVE
NITRITE: NEGATIVE
PROTEIN: NEGATIVE mg/dL
Specific Gravity, Urine: 1.008 (ref 1.005–1.030)
Squamous Epithelial / LPF: NONE SEEN
pH: 5 (ref 5.0–8.0)

## 2016-12-23 LAB — CBC
HCT: 34.7 % — ABNORMAL LOW (ref 39.0–52.0)
HEMOGLOBIN: 11.3 g/dL — AB (ref 13.0–17.0)
MCH: 27.8 pg (ref 26.0–34.0)
MCHC: 32.6 g/dL (ref 30.0–36.0)
MCV: 85.5 fL (ref 78.0–100.0)
PLATELETS: 321 10*3/uL (ref 150–400)
RBC: 4.06 MIL/uL — AB (ref 4.22–5.81)
RDW: 18.4 % — ABNORMAL HIGH (ref 11.5–15.5)
WBC: 7.6 10*3/uL (ref 4.0–10.5)

## 2016-12-23 LAB — PROTIME-INR
INR: 1.42
Prothrombin Time: 17.2 seconds — ABNORMAL HIGH (ref 11.4–15.2)

## 2016-12-23 LAB — ETHANOL

## 2016-12-23 LAB — COMPREHENSIVE METABOLIC PANEL
ALBUMIN: 2.5 g/dL — AB (ref 3.5–5.0)
ALT: 23 U/L (ref 17–63)
AST: 55 U/L — AB (ref 15–41)
Alkaline Phosphatase: 104 U/L (ref 38–126)
Anion gap: 8 (ref 5–15)
BUN: 12 mg/dL (ref 6–20)
CHLORIDE: 100 mmol/L — AB (ref 101–111)
CO2: 28 mmol/L (ref 22–32)
CREATININE: 0.59 mg/dL — AB (ref 0.61–1.24)
Calcium: 8.7 mg/dL — ABNORMAL LOW (ref 8.9–10.3)
GFR calc Af Amer: >60 mL/min (ref >60)
GFR calc non Af Amer: >60 mL/min (ref >60)
Glucose, Bld: 108 mg/dL — ABNORMAL HIGH (ref 65–99)
Potassium: 4.3 mmol/L (ref 3.5–5.1)
SODIUM: 136 mmol/L (ref 135–145)
Total Bilirubin: 0.5 mg/dL (ref 0.3–1.2)
Total Protein: 7.8 g/dL (ref 6.5–8.1)

## 2016-12-23 LAB — CK: CK TOTAL: 21 U/L — AB (ref 49–397)

## 2016-12-23 LAB — APTT: aPTT: 40 seconds — ABNORMAL HIGH (ref 24–36)

## 2016-12-23 LAB — TROPONIN I: Troponin I: <0.03 ng/mL (ref <0.03)

## 2016-12-23 LAB — RAPID URINE DRUG SCREEN, HOSP PERFORMED
AMPHETAMINES: NOT DETECTED
Barbiturates: NOT DETECTED
Benzodiazepines: POSITIVE — AB
Cocaine: NOT DETECTED
OPIATES: NOT DETECTED
Tetrahydrocannabinol: NOT DETECTED

## 2016-12-23 LAB — MAGNESIUM: Magnesium: 1.9 mg/dL (ref 1.7–2.4)

## 2016-12-23 LAB — HEPARIN LEVEL (UNFRACTIONATED): HEPARIN UNFRACTIONATED: 1.94 [IU]/mL — AB (ref 0.30–0.70)

## 2016-12-23 LAB — PHOSPHORUS: PHOSPHORUS: 3.6 mg/dL (ref 2.5–4.6)

## 2016-12-23 MED ORDER — DEXTROSE 5 % IV SOLN
500.0000 mg | INTRAVENOUS | Status: DC
  Administered 2016-12-23 – 2016-12-24 (×2): 500 mg via INTRAVENOUS
  Filled 2016-12-23 (×4): qty 500

## 2016-12-23 MED ORDER — ACETAMINOPHEN 650 MG RE SUPP: 650.0000 mg | Freq: Four times a day (QID) | RECTAL | Status: DC | PRN

## 2016-12-23 MED ORDER — HEPARIN (PORCINE) IN NACL 100-0.45 UNIT/ML-% IJ SOLN: 14.0000 [IU]/kg/h | INTRAMUSCULAR | Status: DC

## 2016-12-23 MED ORDER — ACETAMINOPHEN 325 MG PO TABS: 650.0000 mg | ORAL_TABLET | Freq: Four times a day (QID) | ORAL | Status: DC | PRN

## 2016-12-23 MED ORDER — LORAZEPAM 2 MG/ML IJ SOLN
INTRAMUSCULAR | Status: AC
  Administered 2016-12-23: 1 mg
  Filled 2016-12-23: qty 1

## 2016-12-23 MED ORDER — SODIUM CHLORIDE 0.9 % IV BOLUS (SEPSIS)
500.0000 mL | Freq: Once | INTRAVENOUS | Status: AC
  Administered 2016-12-23: 500 mL via INTRAVENOUS

## 2016-12-23 MED ORDER — VALPROATE SODIUM 500 MG/5ML IV SOLN
500.0000 mg | Freq: Two times a day (BID) | INTRAVENOUS | Status: DC
  Administered 2016-12-23 – 2016-12-24 (×2): 500 mg via INTRAVENOUS
  Filled 2016-12-23 (×3): qty 5

## 2016-12-23 MED ORDER — SODIUM CHLORIDE 0.9 % IV SOLN
100.0000 mL/h | INTRAVENOUS | Status: DC
  Administered 2016-12-23 – 2016-12-24 (×3): 100 mL/h via INTRAVENOUS

## 2016-12-23 MED ORDER — VALPROATE SODIUM 500 MG/5ML IV SOLN
1400.0000 mg | INTRAVENOUS | Status: AC
  Administered 2016-12-23: 1400 mg via INTRAVENOUS
  Filled 2016-12-23: qty 14

## 2016-12-23 MED ORDER — LORAZEPAM 2 MG/ML IJ SOLN: 1.0000 mg | Freq: Once | INTRAMUSCULAR | Status: DC

## 2016-12-23 MED ORDER — SODIUM CHLORIDE 0.9 % IV SOLN: INTRAVENOUS | Status: DC

## 2016-12-23 MED ORDER — HEPARIN (PORCINE) IN NACL 100-0.45 UNIT/ML-% IJ SOLN
1200.0000 [IU]/h | INTRAMUSCULAR | Status: DC
  Administered 2016-12-23: 1050 [IU]/h via INTRAVENOUS
  Filled 2016-12-23: qty 250

## 2016-12-23 MED ORDER — SODIUM CHLORIDE 0.9% FLUSH
3.0000 mL | Freq: Two times a day (BID) | INTRAVENOUS | Status: DC
  Administered 2016-12-23 – 2016-12-25 (×4): 3 mL via INTRAVENOUS

## 2016-12-23 MED ORDER — IOPAMIDOL (ISOVUE-370) INJECTION 76%
INTRAVENOUS | Status: AC
  Administered 2016-12-23: 100 mL
  Filled 2016-12-23: qty 100

## 2016-12-23 NOTE — Progress Notes (Signed)
Pt brought back from CT, alert and oriented, denies any pain at this time, settled in bed with family at bedside, was however reassured, will continue to monitor, v/s stable. [place]-Asidi, Cicley Ganesh Efe

## 2016-12-23 NOTE — ED Notes (Signed)
EEG setting up at bedside.

## 2016-12-23 NOTE — Procedures (Signed)
History: 83 year old male with seizures and waxing/waning aphasia  Sedation: None  Technique: This is a 21 channel routine scalp EEG performed at the bedside with bipolar and monopolar montages arranged in accordance to the international 10/20 system of electrode placement. One channel was dedicated to EKG recording.    Background: The background consists of intermixed alpha and beta activities. There is a well defined posterior dominant rhythm of 8 Hz that attenuates with eye opening. Sleep is not recorded. During the period where the patient became more aphasic, there was a mild increase in generalized irregular delta activity, but no clear focality or epileptiform activity was seen.  Photic stimulation: Physiologic driving is not performed  EEG Abnormalities: 1) mild generalized irregular delta activity associated with clinical episode  Clinical Interpretation: This EEG recorded an episode of worsening speech, and the generalized irregular slow activity could be associated with a very small deep-seated focus, though more likely represents drowsiness or mild encephalopathy.  There was no definite seizure or seizure predisposition recorded on this study. Please note that a normal EEG does not preclude the possibility of epilepsy.   Roland Rack, MD [facility] [phone]  If 7pm- 7am, please page neurology on call as listed in [place].

## 2016-12-23 NOTE — Progress Notes (Signed)
ANTICOAGULATION CONSULT NOTE - Initial Consult  Pharmacy Consult for Heparin. Indication: paroxysmal atrial fibrillation  Allergies  Allergen Reactions  . Amiodarone Nausea And Vomiting  . Bactrim [Sulfamethoxazole-Trimethoprim] Other (See Comments)    Unknown reaction. Listed on MAR  . Penicillins Hives, Swelling and Rash    Patient Measurements:   Heparin Dosing Weight: 76.5 kg  Vital Signs: BP: 127/75 (08/28 1700) Pulse Rate: 59 (08/28 1700)  Labs:  Recent Labs  12/23/16 1421  HGB 11.3*  HCT 34.7*  PLT 321  APTT 40*  LABPROT 17.2*  INR 1.42  CREATININE 0.59*  TROPONINI <0.03    CrCl cannot be calculated (Unknown ideal weight.).   Medical History: Past Medical History:  Diagnosis Date  . Arthritis   . Asthma   . Barrett's esophagus 03-19-2009   EGD  . CAD (coronary artery disease)    a. s/p stent placement in 2006 to the LAD and RCA  . Cataract    REMOVED  . COPD (chronic obstructive pulmonary disease) ([place])   . Diverticulosis 03-19-2009   Colonoscopy   . Erosive esophagitis 03-19-2009   EGD  . GERD (gastroesophageal reflux disease)   . Hiatal hernia 03-19-2009   EGD  . Hyperlipidemia   . Hypertension   . Internal hemorrhoids 03-19-2009   Colonoscopy  . Paroxysmal atrial fibrillation (HCC)   . Sinus node dysfunction (HCC)    a. s/p PPM placement in 2011   Assessment: 83 yo male PMH of paroxysmal atrial fibrillation on apixaban PTA presenting to ED from SNF with worsening confusion, aphasia with repetitive questions without any other focal neurological deficits. Pharmacy consulted for heparin dosing. No signs of bleeding noted.  Renal function normal, Scr is wnl at 0.58.  PTA apixaban: 5 mg PO BID  Goal of Therapy:  Heparin level 0.3-0.7 units/ml aPTT 40 seconds Monitor platelets by anticoagulation protocol: Yes   Plan:  Start heparin at 1050 units/hr at 8:30pm (12 hours since last apixaban dose). Will check an HL/aPTT in 8 hours after  infusion begins (scheduled with AM labs). Will follow both levels until correlating. Daily heparin level, CBC Monitor clinical course, s/sx bleeding  Nida Boatman, PharmD PGY1 Acute Care Pharmacy Resident Pager: [phone]  12/23/2016 6:58 PM

## 2016-12-23 NOTE — Progress Notes (Signed)
Patient resting in bed.  Alert, able to converse with some confusion.  No noted discomfort.

## 2016-12-23 NOTE — H&P (Signed)
History and Physical    Steve Norris CNO:[id number] DOB: 1929-12-31 DOA: 12/23/2016   PCP: Deland Pretty, MD   Attending physician: Marily Memos  Patient coming from/Resides with: [facility]  Chief Complaint: Altered mentation, new onset seizures  HPI: Steve Norris is a 83 y.o. male with medical history significant for hypertension, CAD, grade 2, diastolic heart failure, paroxysmal atrial fibrillation on eliquis, asthma, dementia, dyslipidemia, GERD and Barrett's esophagitis. Patient was sent to the ER today from SNF after staff noted patient having episode of worsening confusion, aphasia with repetitive questions without any other focal neurological deficits. En route to the hospital EMS observed tonic-clonic seizure activity with right-sided gaze preference and incidental tongue injury from biting. After arrival to the ER the patient has had waxing and waning mentation noting at times he is oriented and able to speak appropriately and other times he has perseverating speech and word salad. He has also had episodes of paranoid behavior after arrival. He reported to the attending resident physician that he did not want him to complete the evaluation because he thought that physician was a vampire. In the past 24 hours the patient has begun to use racial slurs as well as foul/vulgar language which is not typical for him. According to the staff his baseline is easy-going, cooperative, friendly and able to mobilize independently around the facility without problems. Initial CT of the head unremarkable except for the possibility of acute left otitis media with opacification of the left middle ear and mastoids new since previous CT in January. Urinalysis unremarkable. Chest x-ray unremarkable. Neurology has been consulted and given waxing and waning altered mentation with recent new onset seizure activity there are concerns patient may be experiencing status epilepticus vs complex partial seizures; a stat  EEG has been ordered. EDP also reported that son states patient has had increasing paranoia over the past one month.  ED Course:  Vital Signs: BP 118/66   Pulse 68   Resp 16   SpO2 92%  2 view CXR: As above CT head w/o contrast: As above Lab data: Sodium 136, potassium 4.3, chloride 100, CO2 28, glucose 108, BUN 12, creatinine 0.59, calcium 8.7, anion gap 8, AST 55, troponin <0.03, white count 7600 differential not obtained, hemoglobin 11.3, platelets 321,000, PT 17.2 with INR 1.42, PTT 40; urinalysis with small amount of hemoglobin otherwise unremarkable, UDS positive for benzodiazepines noting patient prescribed Restoril from SNF  Review of Systems:  **Unable to obtain directly from patient secondary to altered mentation. History obtained from the medical record and from verbal report obtained from [place] and ER resident physician  Past Medical History:  Diagnosis Date  . Arthritis   . Asthma   . Barrett's esophagus 03-19-2009   EGD  . CAD (coronary artery disease)    a. s/p stent placement in 2006 to the LAD and RCA  . Cataract    REMOVED  . COPD (chronic obstructive pulmonary disease) ([place])   . Diverticulosis 03-19-2009   Colonoscopy   . Erosive esophagitis 03-19-2009   EGD  . GERD (gastroesophageal reflux disease)   . Hiatal hernia 03-19-2009   EGD  . Hyperlipidemia   . Hypertension   . Internal hemorrhoids 03-19-2009   Colonoscopy  . Paroxysmal atrial fibrillation ([facility])   . Sinus node dysfunction ([facility])    a. s/p PPM placement in 2011    Past Surgical History:  Procedure Laterality Date  . CARDIAC CATHETERIZATION  02/12/2009   mild ostial left main disease,patent LAD &  RCA stents   . CARDIOVERSION N/A 11/06/2014   Procedure: CARDIOVERSION;  Surgeon: [name], MD;  Location: [facility] ENDOSCOPY;  Service: Cardiovascular;  Laterality: N/A;  . CARDIOVERSION N/A 08/02/2015   Procedure: CARDIOVERSION;  Surgeon: Sueanne Margarita, MD;  Location: [facility] ENDOSCOPY;  Service:  Cardiovascular;  Laterality: N/A;  . CATARACT EXTRACTION    . COLONOSCOPY    . CORONARY STENT PLACEMENT  2006   Right Coronary Artery Cypher stents placed 2006  . HEMORRHOID SURGERY    . LEFT HEART CATHETERIZATION WITH CORONARY ANGIOGRAM N/A 07/01/2013   Procedure: LEFT HEART CATHETERIZATION WITH CORONARY ANGIOGRAM;  Surgeon: Sanda Klein, MD;  Location: [place] CATH LAB;  Service: Cardiovascular;  Laterality: N/A;  . NM MYOCAR PERF WALL MOTION  08/14/2008   no significant ischemia EF 64%  . PACEMAKER INSERTION  07/25/2009   [employer] Accent  . TEE WITHOUT CARDIOVERSION N/A 08/02/2015   Procedure: TRANSESOPHAGEAL ECHOCARDIOGRAM (TEE);  Surgeon: Sueanne Margarita, MD;  Location: [facility] ENDOSCOPY;  Service: Cardiovascular;  Laterality: N/A;  . TONSILLECTOMY    . US ECHOCARDIOGRAPHY  07/12/2009   borderline LA enlargement,mild mitral annular ca+, AOV mildly sclerotic,trace AI.  Marland Kitchen VASECTOMY      Social History   Social History  . Marital status: Married    Spouse name: N/A  . Number of children: N/A  . Years of education: N/A   Occupational History  . Retired     Social History Main Topics  . Smoking status: Former Research scientist (life sciences)  . Smokeless tobacco: Never Used     Comment: Quit smoking 1996  . Alcohol use No  . Drug use: No  . Sexual activity: Not on file   Other Topics Concern  . Not on file   Social History Narrative   Daily caffeine     Mobility: Independent Work history: Not obtained   Allergies  Allergen Reactions  . Amiodarone Nausea And Vomiting  . Penicillins Hives, Swelling and Rash    Family History  Problem Relation Age of Onset  . Heart disease Mother   . Heart disease Father   . Colon cancer Neg Hx     Prior to Admission medications   Medication Sig Start Date End Date Taking? Authorizing Provider  albuterol (ACCUNEB) 0.63 MG/3ML nebulizer solution Take 1 ampule by nebulization every 8 (eight) hours as needed for wheezing.    [provider]  finasteride  (PROSCAR) 5 MG tablet Take 5 mg by mouth at bedtime.  02/17/12   [provider]  furosemide (LASIX) 20 MG tablet Take 1 tablet (20 mg total) by mouth daily. 01/10/16   Croitoru, Mihai, MD  nebivolol (BYSTOLIC) 5 MG tablet Take 1 tablet (5 mg total) by mouth daily. 06/30/16   Croitoru, Mihai, MD  neomycin-bacitracin-polymyxin (NEOSPORIN) OINT Apply 1 application topically daily as needed for wound care. Reported on 08/31/2015    [provider]  nitroGLYCERIN (NITROSTAT) 0.4 MG SL tablet Place 0.4 mg under the tongue every 5 (five) minutes as needed for chest pain.     [provider]  potassium chloride SA (K-DUR,KLOR-CON) 20 MEQ tablet take 1 tablet by mouth once daily 12/18/15   Croitoru, Mihai, MD  rosuvastatin (CRESTOR) 20 MG tablet 20 mg daily.  06/20/16   [provider]  senna-docusate (SENNA-S) 8.6-50 MG per tablet Take 1 tablet by mouth at bedtime.    [provider]  sertraline (ZOLOFT) 50 MG tablet Take 1 tablet by mouth daily. 03/07/16   [provider]  sotalol (BETAPACE) 80 MG tablet take 1 tablet by mouth every 12 hours 02/11/16   Croitoru, Mihai, MD  tamsulosin (FLOMAX) 0.4 MG CAPS capsule Take 1 capsule by mouth daily. 12/27/15   [provider]  temazepam (RESTORIL) 30 MG capsule Take 30 mg by mouth at bedtime. 05/24/13   [provider]  warfarin (COUMADIN) 5 MG tablet Take 0.5 tablet (2.5 mg) on (Mon, Wed, Fri, Sat) and Take 1 tablet (5 mg) on (Sun Tues, Thurs). 08/13/16   Annita Brod, MD    Physical Exam: Vitals:   12/23/16 1500 12/23/16 1501 12/23/16 1530 12/23/16 1532  BP: 120/62  118/66   Pulse: 65  68   Resp: 17  16   SpO2:  95%  92%      Constitutional: Appears stated age, somewhat pale, appears somewhat tentative/frightened Eyes: PERRL, lids and conjunctivae normal ENMT: Mucous membranes are dry. Posterior pharynx clear of any exudate or lesions. Age-appropriate dentition. Bilateral tympanic  membranes obstructed by cerumen Neck: normal, supple, no masses, no thyromegaly Respiratory: clear to auscultation bilaterally, no wheezing, no crackles. Normal respiratory effort. No accessory muscle use.  Cardiovascular: Regular rate and rhythm, no murmurs / rubs / gallops. No extremity edema. 2+ pedal pulses. No carotid bruits.  Abdomen: no tenderness, no masses palpated. No hepatosplenomegaly. Bowel sounds positive.  Musculoskeletal: no clubbing / cyanosis. No joint deformity upper and lower extremities. Good ROM, no contractures. Normal muscle tone.  Skin: no rashes, lesions, ulcers. No induration Neurologic: CN 2-12 grossly intact. Sensation intact, DTR normal. Strength 5/5 x all 4 extremities. Perseverating speech mimicking aphasia with patient slow to respond to questions asked. Psychiatric:  Alert but difficult to ascertain orientation given patient's recurrence of perseverating speech with occasional word salad. Flat, somewhat paranoid mood.    Labs on Admission: I have personally reviewed following labs and imaging studies  CBC:  Recent Labs Lab 12/23/16 1421  WBC 7.6  HGB 11.3*  HCT 34.7*  MCV 85.5  PLT 322   Basic Metabolic Panel:  Recent Labs Lab 12/23/16 1421  NA 136  K 4.3  CL 100*  CO2 28  GLUCOSE 108*  BUN 12  CREATININE 0.59*  CALCIUM 8.7*   GFR: CrCl cannot be calculated (Unknown ideal weight.). Liver Function Tests:  Recent Labs Lab 12/23/16 1421  AST 55*  ALT 23  ALKPHOS 104  BILITOT 0.5  PROT 7.8  ALBUMIN 2.5*   No results for input(s): LIPASE, AMYLASE in the last 168 hours. No results for input(s): AMMONIA in the last 168 hours. Coagulation Profile:  Recent Labs Lab 12/23/16 1421  INR 1.42   Cardiac Enzymes:  Recent Labs Lab 12/23/16 1421  TROPONINI <0.03   BNP (last 3 results) No results for input(s): PROBNP in the last 8760 hours. HbA1C: No results for input(s): HGBA1C in the last 72 hours. CBG: No results for  input(s): GLUCAP in the last 168 hours. Lipid Profile: No results for input(s): CHOL, HDL, LDLCALC, TRIG, CHOLHDL, LDLDIRECT in the last 72 hours. Thyroid Function Tests: No results for input(s): TSH, T4TOTAL, FREET4, T3FREE, THYROIDAB in the last 72 hours. Anemia Panel: No results for input(s): VITAMINB12, FOLATE, FERRITIN, TIBC, IRON, RETICCTPCT in the last 72 hours. Urine analysis:    Component Value Date/Time   COLORURINE YELLOW 12/23/2016 1320   APPEARANCEUR CLEAR 12/23/2016 1320   LABSPEC 1.008 12/23/2016 1320   PHURINE 5.0 12/23/2016 1320   GLUCOSEU NEGATIVE 12/23/2016 1320   HGBUR SMALL (A) 12/23/2016 1320  BILIRUBINUR NEGATIVE 12/23/2016 1320   KETONESUR NEGATIVE 12/23/2016 [place] 12/23/2016 1320   NITRITE NEGATIVE 12/23/2016 1320   LEUKOCYTESUR NEGATIVE 12/23/2016 1320   Sepsis Labs: @LABRCNTIP (procalcitonin:4,lacticidven:4) )No results found for this or any previous visit (from the past 240 hour(s)).   Radiological Exams on Admission: Dg Chest 2 View  Result Date: 12/23/2016 CLINICAL DATA:  Altered mental status EXAM: CHEST  2 VIEW COMPARISON:  09/18/2016 FINDINGS: Left pacer remains in place, unchanged. Heart is upper limits normal in size. Diffuse interstitial prominence within the lungs, stable since prior study and dating back to 2017, most likely chronic interstitial lung disease. No confluent opacities or effusions. IMPRESSION: Suspect chronic interstitial lung disease, stable. Borderline heart size. Electronically Signed   By: Rolm Baptise M.D.   On: 12/23/2016 14:18   Ct Head Wo Contrast  Result Date: 12/23/2016 CLINICAL DATA:  83 year old male with altered level of consciousness. Increased confusion, repetitive questions, an while in transport to ED had tonic clonic seizure. EXAM: CT HEAD WITHOUT CONTRAST TECHNIQUE: Contiguous axial images were obtained from the base of the skull through the vertex without intravenous contrast. COMPARISON:   Head CT 05/06/2016. FINDINGS: Brain: Cerebral volume is stable and within normal limits for age. No midline shift, ventriculomegaly, mass effect, evidence of mass lesion, intracranial hemorrhage or evidence of cortically based acute infarction. Gray-white matter differentiation is within normal limits throughout the brain. Vascular: Calcified atherosclerosis at the skull base. No suspicious intracranial vascular hyperdensity. Skull: Hyperostosis of the skull, normal variant. No acute osseous abnormality identified. Sinuses/Orbits: New opacification of the left middle ear and mastoids. No definite mastoid coalescence or osseous erosion. Other visible paranasal sinuses and mastoids are stable and well pneumatized. Other: No acute orbit or scalp soft tissue findings. Negative visible nasopharynx and oropharynx. IMPRESSION: 1. Opacification of the left middle ear and mastoids is new since January. Consider acute otitis media. 2. Noncontrast CT appearance of the brain remains normal for age. Electronically Signed   By: Genevie Ann M.D.   On: 12/23/2016 14:06    EKG: (Independently reviewed) sinus rhythm with ventricular rate 68 bpm, QTC 510 ms, incomplete right bundle-branch block, normal wave rotation, no acute ischemic changes  Assessment/Plan Principal Problem:   New onset seizure  -Presents to ER after < 24 hours acute change in mentation from baseline with witnessed seizure activity en route to ER -Neurology evaluating -With waxing/waning mentation concern is for possible status epilepticus vs complex partial seizures therefore stat EEG in process -Unable to have MR 2/2 pacemaker so Neuro ordering CTA head and neck w/ cerebral perfusion images -Seizure precautions -Neurology has started IV Depacon  -Possibly precipitated by acute infectious etiology (see below)  Active Problems:   Acute left otitis media -Incidental finding on noncontrasted CT head -Tympanic membranes unable to be visualized secondary  to cerumen -Empiric Zithromax (PCN allergy) -Blood cultures -Appears somewhat dehydrated so will utilize IV fluid at 100/hr    Acute encephalopathy -According to verbal report from son, patient has had increasing paranoia for one month with acute changes in the past 24 hours as evidence by marked personality and behavior changes as well as acute paranoia and new onset seizure activity as documented above -Infectious and seizure/neurological workup as above -Sitter at bedside -Treat underlying causes -NPO    PAF (paroxysmal atrial fibrillation)  -Currently maintaining sinus rhythm -On sotalol prior to admission as well as Bystolic -Hold eliquis in favor of IV heparin while NPO -CHADVASc=5 -Has pacer  Benign essential HTN -Current blood pressure suboptimal so we'll hold preadmission antihypertensive medications as well as diuretic    Chronic diastolic heart failure, NYHA class 2  -Hold Lasix -Daily weights and strict I/O -Not on ARB prior to admission -On Bystolic prior to admission -Last echo April 2018: Mild LVH, normal systolic function, grade 2 diastolic dysfunction, biatrial dilatation    Elevated AST (SGOT) -New finding without elevation and other LFTs -?? Due to meds or volume depletion -Follow labs    Hyperlipidemia -Diet controlled    Dementia -On Zoloft prior to admission -Also on Restoril for nocturnal sleep disturbance possibly related to his dementia -Above meds on hold secondary to NPO status and uncertain etiology 2 acute encephalopathy    Asthma -Currently not actively wheezing -On Proventil nebs prior to admission      DVT prophylaxis: IV heparin with pharmacy dosing  Code Status: Full  Family Communication: No family at bedside-ER resident physician did speak with son prior to my evaluation Disposition Plan: Return to SNF Consults called: Neurology/[place]     Myrtice Lowdermilk L. ANP-BC [facility] Pager [phone]   If  7PM-7AM, please contact night-coverage [email or web address] Password [email or web address]  12/23/2016, 4:06 PM

## 2016-12-23 NOTE — Consult Note (Signed)
NEURO HOSPITALIST CONSULT NOTE   Requestig physician: Dr. Vanita Panda   Reason for Consult: seizure and confusion   History obtained from:  Patient   Chart    HPI:                                                                                                                                          Steve Norris is an 83 y.o. male with history of paroxysmal atrial fibrillation, hypertension, hyperlipidemia, neurocognitive decline to arrived at the emergency department from [place] [facility] and staff was called as patient was found to have an episode of increased confusion, difficulty speaking and repetitive questions. On transportation from EMS it is recorded that he had a tonic-clonic seizure which consisted of full body jerking, right sided gaze preference and he did bite his tongue. Neurology was asked to come to see him as he continued to have some expressive aphasia. On arriving to the room patient seemed to improve as he was able to name objects, repeat, follow instructions, he was alert, oriented to [facility] and that he was in the hospital. He is able to state that he does not have a seizure history. There was points during the exam however he would say no, no, no, which makes me slightly concerned that he may still be having a seizure focus. For this reason stat EEG was called.  Past Medical History:  Diagnosis Date  . Arthritis   . Asthma   . Barrett's esophagus 03-19-2009   EGD  . CAD (coronary artery disease)    a. s/p stent placement in 2006 to the LAD and RCA  . Cataract    REMOVED  . COPD (chronic obstructive pulmonary disease) ([place])   . Diverticulosis 03-19-2009   Colonoscopy   . Erosive esophagitis 03-19-2009   EGD  . GERD (gastroesophageal reflux disease)   . Hiatal hernia 03-19-2009   EGD  . Hyperlipidemia   . Hypertension   . Internal hemorrhoids 03-19-2009   Colonoscopy  . Paroxysmal atrial fibrillation (HCC)   . Sinus node  dysfunction (HCC)    a. s/p PPM placement in 2011    Past Surgical History:  Procedure Laterality Date  . CARDIAC CATHETERIZATION  02/12/2009   mild ostial left main disease,patent LAD & RCA stents   . CARDIOVERSION N/A 11/06/2014   Procedure: CARDIOVERSION;  Surgeon: [name], MD;  Location: [facility] ENDOSCOPY;  Service: Cardiovascular;  Laterality: N/A;  . CARDIOVERSION N/A 08/02/2015   Procedure: CARDIOVERSION;  Surgeon: Sueanne Margarita, MD;  Location: [facility] ENDOSCOPY;  Service: Cardiovascular;  Laterality: N/A;  . CATARACT EXTRACTION    . COLONOSCOPY    . CORONARY STENT PLACEMENT  2006   Right Coronary Artery Cypher stents placed 2006  . HEMORRHOID SURGERY    .  LEFT HEART CATHETERIZATION WITH CORONARY ANGIOGRAM N/A 07/01/2013   Procedure: LEFT HEART CATHETERIZATION WITH CORONARY ANGIOGRAM;  Surgeon: Sanda Klein, MD;  Location: [facility] CATH LAB;  Service: Cardiovascular;  Laterality: N/A;  . NM MYOCAR PERF WALL MOTION  08/14/2008   no significant ischemia EF 64%  . PACEMAKER INSERTION  07/25/2009   [employer] Accent  . TEE WITHOUT CARDIOVERSION N/A 08/02/2015   Procedure: TRANSESOPHAGEAL ECHOCARDIOGRAM (TEE);  Surgeon: Sueanne Margarita, MD;  Location: [facility] ENDOSCOPY;  Service: Cardiovascular;  Laterality: N/A;  . TONSILLECTOMY    . US ECHOCARDIOGRAPHY  07/12/2009   borderline LA enlargement,mild mitral annular ca+, AOV mildly sclerotic,trace AI.  Marland Kitchen VASECTOMY      Family History  Problem Relation Age of Onset  . Heart disease Mother   . Heart disease Father   . Colon cancer Neg Hx     Social History:  reports that he has quit smoking. He has never used smokeless tobacco. He reports that he does not drink alcohol or use drugs.  Allergies  Allergen Reactions  . Amiodarone Nausea And Vomiting  . Penicillins Hives, Swelling and Rash    MEDICATIONS:                                                                                                                     Current Facility-Administered  Medications  Medication Dose Route Frequency Provider Last Rate Last Dose  . 0.9 %  sodium chloride infusion  100 mL/hr Intravenous Continuous Carmin Muskrat, MD      . 0.9 %  sodium chloride infusion   Intravenous Continuous Samella Parr, NP       Current Outpatient Prescriptions  Medication Sig Dispense Refill  . albuterol (ACCUNEB) 0.63 MG/3ML nebulizer solution Take 1 ampule by nebulization every 8 (eight) hours as needed for wheezing.    . finasteride (PROSCAR) 5 MG tablet Take 5 mg by mouth at bedtime.     . furosemide (LASIX) 20 MG tablet Take 1 tablet (20 mg total) by mouth daily. 30 tablet 6  . nebivolol (BYSTOLIC) 5 MG tablet Take 1 tablet (5 mg total) by mouth daily. 90 tablet 2  . neomycin-bacitracin-polymyxin (NEOSPORIN) OINT Apply 1 application topically daily as needed for wound care. Reported on 08/31/2015    . nitroGLYCERIN (NITROSTAT) 0.4 MG SL tablet Place 0.4 mg under the tongue every 5 (five) minutes as needed for chest pain.     . potassium chloride SA (K-DUR,KLOR-CON) 20 MEQ tablet take 1 tablet by mouth once daily 30 tablet 6  . rosuvastatin (CRESTOR) 20 MG tablet 20 mg daily.     Marland Kitchen senna-docusate (SENNA-S) 8.6-50 MG per tablet Take 1 tablet by mouth at bedtime.    . sertraline (ZOLOFT) 50 MG tablet Take 1 tablet by mouth daily.    . sotalol (BETAPACE) 80 MG tablet take 1 tablet by mouth every 12 hours 60 tablet 5  . tamsulosin (FLOMAX) 0.4 MG CAPS capsule Take 1 capsule by mouth daily.    Marland Kitchen  temazepam (RESTORIL) 30 MG capsule Take 30 mg by mouth at bedtime.    Marland Kitchen warfarin (COUMADIN) 5 MG tablet Take 0.5 tablet (2.5 mg) on (Mon, Wed, Fri, Sat) and Take 1 tablet (5 mg) on (Sun Tues, Thurs). 90 tablet 1      ROS:                                                                                                                                       History obtained from the patient  General ROS: negative for - chills, fatigue, fever, night sweats, weight gain or weight  loss Psychological ROS: negative for - behavioral disorder, hallucinations, memory difficulties, mood swings or suicidal ideation Ophthalmic ROS: negative for - blurry vision, double vision, eye pain or loss of vision ENT ROS: negative for - epistaxis, nasal discharge, oral lesions, sore throat, tinnitus or vertigo Allergy and Immunology ROS: negative for - hives or itchy/watery eyes Hematological and Lymphatic ROS: negative for - bleeding problems, bruising or swollen lymph nodes Endocrine ROS: negative for - galactorrhea, hair pattern changes, polydipsia/polyuria or temperature intolerance Respiratory ROS: negative for - cough, hemoptysis, shortness of breath or wheezing Cardiovascular ROS: negative for - chest pain, dyspnea on exertion, edema or irregular heartbeat Gastrointestinal ROS: negative for - abdominal pain, diarrhea, hematemesis, nausea/vomiting or stool incontinence Genito-Urinary ROS: negative for - dysuria, hematuria, incontinence or urinary frequency/urgency Musculoskeletal ROS: negative for - joint swelling or muscular weakness Neurological ROS: as noted in HPI Dermatological ROS: negative for rash and skin lesion changes   Blood pressure 120/62, pulse 65, resp. rate 17, SpO2 95 %.   Neurologic Examination:                                                                                                      HEENT-  Normocephalic, no lesions, without obvious abnormality.  Normal external eye and conjunctiva.  Normal TM's bilaterally.  Normal auditory canals and external ears. Normal external nose, mucus membranes and septum.  Normal pharynx. Cardiovascular- S1, S2 normal, pulses palpable throughout   Lungs- chest clear, no wheezing, rales, normal symmetric air entry Abdomen- normal findings: bowel sounds normal Extremities- no edema Lymph-no adenopathy palpable Musculoskeletal-no joint tenderness, deformity or swelling Skin-warm and dry, no hyperpigmentation, vitiligo, or  suspicious lesions  Neurological Examination Mental Status: Alert, oriented, thought content appropriate.  Speech fluent without evidence of aphasia. At times during the conversation when asked a question he would answer appropriately and then say the same word  a few times over and over but this did not continue. Able to follow simple step commands without difficulty.  --Of note upon entering the room approximately 10 minutes later patient was noted to have significant expressive aphasia and some mild receptive aphasia. This was completely different from the initial exam. At current time patient is being hooked up to EEG.   Cranial Nerves: II: ; Visual fields grossly normal,  III,IV, VI: ptosis not present, extra-ocular motions intact bilaterally pupils equal, round, reactive to light and accommodation V,VII: smile symmetric, facial light touch sensation normal bilaterally VIII: hearing normal bilaterally IX,X: uvula rises symmetrically XI: bilateral shoulder shrug XII: midline tongue extension Motor: Patient shows 5/5 strength throughout the upper extremities, he is on a bedpan but shows good 4/5 strength throughout the lower extremities. There is no seizure-like activity noted. He does have a postural tremor in the bilateral upper extremities. Sensory: Pinprick and light touch intact throughout, bilaterally Deep Tendon Reflexes: 2+ and symmetric throughout Plantars: Right: downgoing   Left: downgoing Cerebellar: normal finger-to-nose,  Gait: Not tested      Lab Results: Basic Metabolic Panel:  Recent Labs Lab 12/23/16 1421  NA 136  K 4.3  CL 100*  CO2 28  GLUCOSE 108*  BUN 12  CREATININE 0.59*  CALCIUM 8.7*    Liver Function Tests:  Recent Labs Lab 12/23/16 1421  AST 55*  ALT 23  ALKPHOS 104  BILITOT 0.5  PROT 7.8  ALBUMIN 2.5*   No results for input(s): LIPASE, AMYLASE in the last 168 hours. No results for input(s): AMMONIA in the last 168  hours.  CBC:  Recent Labs Lab 12/23/16 1421  WBC 7.6  HGB 11.3*  HCT 34.7*  MCV 85.5  PLT 321    Cardiac Enzymes:  Recent Labs Lab 12/23/16 1421  TROPONINI <0.03    Lipid Panel: No results for input(s): CHOL, TRIG, HDL, CHOLHDL, VLDL, LDLCALC in the last 168 hours.  CBG: No results for input(s): GLUCAP in the last 168 hours.  Microbiology: Results for orders placed or performed during the hospital encounter of 08/01/15  Culture, blood (routine x 2) Call MD if unable to obtain prior to antibiotics being given     Status: None   Collection Time: 08/03/15  7:07 PM  Result Value Ref Range Status   Specimen Description BLOOD RIGHT ANTECUBITAL  Final   Special Requests IN PEDIATRIC BOTTLE 3CC  Final   Culture NO GROWTH 5 DAYS  Final   Report Status 08/08/2015 FINAL  Final  Culture, blood (routine x 2) Call MD if unable to obtain prior to antibiotics being given     Status: None   Collection Time: 08/03/15  7:11 PM  Result Value Ref Range Status   Specimen Description BLOOD LEFT ANTECUBITAL  Final   Special Requests BOTTLES DRAWN AEROBIC ONLY [place]  Final   Culture NO GROWTH 5 DAYS  Final   Report Status 08/08/2015 FINAL  Final    Coagulation Studies:  Recent Labs  12/23/16 1421  LABPROT 17.2*  INR 1.42    Imaging: Dg Chest 2 View  Result Date: 12/23/2016 CLINICAL DATA:  Altered mental status EXAM: CHEST  2 VIEW COMPARISON:  09/18/2016 FINDINGS: Left pacer remains in place, unchanged. Heart is upper limits normal in size. Diffuse interstitial prominence within the lungs, stable since prior study and dating back to 2017, most likely chronic interstitial lung disease. No confluent opacities or effusions. IMPRESSION: Suspect chronic interstitial lung disease, stable. Borderline heart size.  Electronically Signed   By: Rolm Baptise M.D.   On: 12/23/2016 14:18   Ct Head Wo Contrast  Result Date: 12/23/2016 CLINICAL DATA:  83 year old male with altered level of  consciousness. Increased confusion, repetitive questions, an while in transport to ED had tonic clonic seizure. EXAM: CT HEAD WITHOUT CONTRAST TECHNIQUE: Contiguous axial images were obtained from the base of the skull through the vertex without intravenous contrast. COMPARISON:  Head CT 05/06/2016. FINDINGS: Brain: Cerebral volume is stable and within normal limits for age. No midline shift, ventriculomegaly, mass effect, evidence of mass lesion, intracranial hemorrhage or evidence of cortically based acute infarction. Gray-white matter differentiation is within normal limits throughout the brain. Vascular: Calcified atherosclerosis at the skull base. No suspicious intracranial vascular hyperdensity. Skull: Hyperostosis of the skull, normal variant. No acute osseous abnormality identified. Sinuses/Orbits: New opacification of the left middle ear and mastoids. No definite mastoid coalescence or osseous erosion. Other visible paranasal sinuses and mastoids are stable and well pneumatized. Other: No acute orbit or scalp soft tissue findings. Negative visible nasopharynx and oropharynx. IMPRESSION: 1. Opacification of the left middle ear and mastoids is new since January. Consider acute otitis media. 2. Noncontrast CT appearance of the brain remains normal for age. Electronically Signed   By: Genevie Ann M.D.   On: 12/23/2016 14:06       Assessment and plan per attending neurologist  Etta Quill PA-C [facility] [phone]  12/23/2016, 3:43 PM   Assessment/Plan: This is a 83 year old male who was brought to the emergency department secondary to having altered mental status and repeating questions. In route noted to have a tonic-clonic seizure. While in the emergency room patient's mental status has waxed and waned which is concerning for possible intermittent complex partial seizures. At this time we'll order a stat EEG to evaluate for possible focus versus seizure that is active  1) EEG 2)  CT perfusion 3) Depacon 20mg /kg   Roland Rack, MD [facility] [phone]  If 7pm- 7am, please page neurology on call as listed in [place].

## 2016-12-23 NOTE — Significant Event (Signed)
Rapid Response Event Note  Overview:  Called by staff for Code Blue Time Called: 1748 Arrival Time: 7207 Event Type: Neurologic, Other (Comment)  Initial Focused Assessment:  Called by staff by Code blue followed by [facility] Team page.  On my arrival to bedside, Patient was being ambued by Rt and was placed on zoll.  Patient was starting to move spontaneously so nasal cannula was placed on patient with sats 94%.  As per Rn, patient just arrived to unit from [place] and patient had a seizure where he became unresponsive, mouth became clenched and right arm was shaking, and was becoming cyanotic so Code blue was called.  Seizure activity lasted about 1 minute   Interventions:   Patient was very agitated on my arrival, appears to be posticital.  Spoke with Dr. Leonel Ramsay, 1 mg ativan given IV.  Depacon load was given.  #18 PIV was started in left [facility].    Plan of Care (if not transferred):  RN to monitor patient and to call if assistance needed  Event Summary:   at      at          [facility]

## 2016-12-23 NOTE — ED Provider Notes (Signed)
[facility] DEPT Provider Note   CSN: [id number] Arrival date & time: 12/23/16  1259     History   Chief Complaint Chief Complaint  Patient presents with  . Altered Mental Status  . Seizures    HPI Steve Norris is a 83 y.o. male.  Presented today for alteration in mental status and seizure while in route to the ED via EMS. Patient has an extensive past medical history significant for hyperlipidemia, hypertension, paroxysmal A. Fib, erosive esophagitis, GERD, COPD, coronary artery disease, and asthma. As per staff at [facility], patient was noted to be altered last night with use of racial slurs, and vulgar terms which is highly unusual behavior. At approximately 7 this morning, prior to breakfast, patient did not rise on his own as he typically does. This prompted evaluation by the staff who contacted the house physician at some point in the morning. The house doctor was contacted and following evaluation was eventually determined to need transport via EMS to the ED. Patient would respond normally and then go into episodes of repetitive word formation. Based on staff report at the facility, patient's baseline dementia is not typically an issue as he communicates regularly without issue. Staff initially stated that as of this morning the patient began repeating the same words over and over again until he was able to write a few statement down which appeared to temporarily resolve the issue, or so it appeared. At this point he again appeared to recover temporarily. Per staff, at baseline he is easy-going, cooperates well with others, friendly, and walks the facility without issue. Patient refused complete evaluation by ED resident physician for fear that the medical resident was, as the patient stated, a vampire. When questioned as to how often the patient sees vampires, he stated that he had not up until today.       Past Medical History:  Diagnosis Date  . Arthritis     . Asthma   . Barrett's esophagus 03-19-2009   EGD  . CAD (coronary artery disease)    a. s/p stent placement in 2006 to the LAD and RCA  . Cataract    REMOVED  . COPD (chronic obstructive pulmonary disease) ([place])   . Diverticulosis 03-19-2009   Colonoscopy   . Erosive esophagitis 03-19-2009   EGD  . GERD (gastroesophageal reflux disease)   . Hiatal hernia 03-19-2009   EGD  . Hyperlipidemia   . Hypertension   . Internal hemorrhoids 03-19-2009   Colonoscopy  . Paroxysmal atrial fibrillation (HCC)   . Sinus node dysfunction (HCC)    a. s/p PPM placement in 2011    Patient Active Problem List   Diagnosis Date Noted  . New onset seizure ([place]) 12/23/2016  . Acute left otitis media 12/23/2016  . Acute encephalopathy 12/23/2016  . PAF (paroxysmal atrial fibrillation) ([place]) 12/23/2016  . Benign essential HTN 12/23/2016  . Chronic diastolic heart failure, NYHA class 2 ([place]) 12/23/2016  . Hyperlipidemia 12/23/2016  . Dementia 12/23/2016  . Asthma 12/23/2016  . Elevated AST (SGOT) 12/23/2016  . Acute diastolic CHF (congestive heart failure) ([place])   . Pressure injury of skin 08/09/2016  . BPH (benign prostatic hyperplasia) 08/09/2016  . HTN (hypertension) 08/09/2016  . CHF exacerbation ([place]) 08/08/2016  . Anemia 03/25/2016  . SSS (sick sinus syndrome) ([place]) 03/25/2016  . CAP (community acquired pneumonia)   . Atrial fibrillation with rapid ventricular response ([place]) 11/04/2014  . Dyspnea 12/15/2012  . Long term current use of  anticoagulant therapy 07/14/2012  . COPD (chronic obstructive pulmonary disease) ([place]) 02/23/2012  . CAD (coronary artery disease) 02/23/2012  . S/P placement of cardiac pacemaker 02/23/2012  . Atrial fibrillation ([place]) 02/23/2012  . BARRETT'S ESOPHAGUS 04/30/2009    Past Surgical History:  Procedure Laterality Date  . CARDIAC CATHETERIZATION  02/12/2009   mild ostial left main disease,patent LAD & RCA stents   . CARDIOVERSION N/A 11/06/2014    Procedure: CARDIOVERSION;  Surgeon: [name], MD;  Location: [facility] ENDOSCOPY;  Service: Cardiovascular;  Laterality: N/A;  . CARDIOVERSION N/A 08/02/2015   Procedure: CARDIOVERSION;  Surgeon: Sueanne Margarita, MD;  Location: [facility] ENDOSCOPY;  Service: Cardiovascular;  Laterality: N/A;  . CATARACT EXTRACTION    . COLONOSCOPY    . CORONARY STENT PLACEMENT  2006   Right Coronary Artery Cypher stents placed 2006  . HEMORRHOID SURGERY    . LEFT HEART CATHETERIZATION WITH CORONARY ANGIOGRAM N/A 07/01/2013   Procedure: LEFT HEART CATHETERIZATION WITH CORONARY ANGIOGRAM;  Surgeon: Sanda Klein, MD;  Location: [facility] CATH LAB;  Service: Cardiovascular;  Laterality: N/A;  . NM MYOCAR PERF WALL MOTION  08/14/2008   no significant ischemia EF 64%  . PACEMAKER INSERTION  07/25/2009   [employer] Accent  . TEE WITHOUT CARDIOVERSION N/A 08/02/2015   Procedure: TRANSESOPHAGEAL ECHOCARDIOGRAM (TEE);  Surgeon: Sueanne Margarita, MD;  Location: [facility] ENDOSCOPY;  Service: Cardiovascular;  Laterality: N/A;  . TONSILLECTOMY    . US ECHOCARDIOGRAPHY  07/12/2009   borderline LA enlargement,mild mitral annular ca+, AOV mildly sclerotic,trace AI.  Marland Kitchen VASECTOMY         Home Medications    Prior to Admission medications   Medication Sig Start Date End Date Taking? Authorizing Provider  albuterol (ACCUNEB) 0.63 MG/3ML nebulizer solution Take 1 ampule by nebulization every 8 (eight) hours as needed for wheezing.   Yes [provider]  apixaban (ELIQUIS) 5 MG TABS tablet Take 5 mg by mouth 2 (two) times daily.   Yes [provider]  finasteride (PROSCAR) 5 MG tablet Take 5 mg by mouth daily.  02/17/12  Yes [provider]  furosemide (LASIX) 20 MG tablet Take 1 tablet (20 mg total) by mouth daily. Patient taking differently: Take 40 mg by mouth daily.  01/10/16  Yes Croitoru, Mihai, MD  Multiple Vitamins-Minerals (MULTIVITAMIN WITH MINERALS) tablet Take 1 tablet by mouth daily.   Yes [provider]    nitroGLYCERIN (NITROSTAT) 0.4 MG SL tablet Place 0.4 mg under the tongue every 5 (five) minutes as needed for chest pain.    Yes [provider]  potassium chloride SA (K-DUR,KLOR-CON) 20 MEQ tablet take 1 tablet by mouth once daily Patient taking differently: take 76mEq by mouth once daily 12/18/15  Yes Croitoru, Mihai, MD  rosuvastatin (CRESTOR) 20 MG tablet Take 20 mg by mouth daily.  06/20/16  Yes [provider]  senna-docusate (SENNA-S) 8.6-50 MG per tablet Take 1 tablet by mouth as needed for mild constipation.    Yes [provider]  sertraline (ZOLOFT) 50 MG tablet Take 100 mg by mouth daily.  03/07/16  Yes [provider]  sotalol (BETAPACE) 80 MG tablet take 1 tablet by mouth every 12 hours Patient taking differently: take 1 tablet by mouth twice daily 02/11/16  Yes Croitoru, Mihai, MD  tamsulosin (FLOMAX) 0.4 MG CAPS capsule Take 1 capsule by mouth daily. 12/27/15  Yes [provider]  temazepam (RESTORIL) 30 MG capsule Take 30 mg by mouth at bedtime. 05/24/13  Yes [provider]  nebivolol (BYSTOLIC) 5 MG tablet Take 1 tablet (5 mg total) by mouth daily. Patient not taking: Reported on 12/23/2016 06/30/16   Croitoru, Dani Gobble, MD  warfarin (COUMADIN) 5 MG tablet Take 0.5 tablet (2.5 mg) on (Mon, Wed, Fri, Sat) and Take 1 tablet (5 mg) on (Sun Tues, Thurs). Patient not taking: Reported on 12/23/2016 08/13/16   Annita Brod, MD    Family History Family History  Problem Relation Age of Onset  . Heart disease Mother   . Heart disease Father   . Colon cancer Neg Hx     Social History Social History  Substance Use Topics  . Smoking status: Former Research scientist (life sciences)  . Smokeless tobacco: Never Used     Comment: Quit smoking 1996  . Alcohol use No     Allergies   Amiodarone; Bactrim [sulfamethoxazole-trimethoprim]; and Penicillins   Review of Systems Review of Systems  Unable to perform ROS: Dementia  Constitutional: Negative for  chills.  Respiratory: Negative for shortness of breath.   Cardiovascular: Negative for chest pain.  Gastrointestinal: Negative for abdominal pain and vomiting.  Skin: Negative for color change and rash.  Neurological: Positive for seizures.  All other systems reviewed and are negative.    Physical Exam Updated Vital Signs BP 109/61 (BP Location: Right Arm)   Pulse 67   Temp 97.6 F (36.4 C) (Oral)   Resp 18   Ht 5\' 10"  (1.778 m)   Wt 76.5 kg (168 lb 11.2 oz)   SpO2 99%   BMI 24.21 kg/m   Physical Exam  Constitutional: He appears well-developed and well-nourished.  Patient refused to permit MD to fully assess his status stating he did not think the people in the room were doctors. He eventually permitted the nurse to exam his cardiac and pulmonary status but continued to refuse evaluation by the resident.   HENT:  Head: Normocephalic and atraumatic.  Eyes: Conjunctivae are normal.  Unable to assess as he refuses  Neck: Neck supple.  Abdominal: Soft. There is no tenderness.  Musculoskeletal: He exhibits no edema.  Neurological: He is alert.  Skin: Skin is warm and dry.  Psychiatric: He has a normal mood and affect.  Nursing note and vitals reviewed.    ED Treatments / Results  Labs (all labs ordered are listed, but only abnormal results are displayed) Labs Reviewed  CBC - Abnormal; Notable for the following:       Result Value   RBC 4.06 (*)    Hemoglobin 11.3 (*)    HCT 34.7 (*)    RDW 18.4 (*)    All other components within normal limits  COMPREHENSIVE METABOLIC PANEL - Abnormal; Notable for the following:    Chloride 100 (*)    Glucose, Bld 108 (*)    Creatinine, Ser 0.59 (*)    Calcium 8.7 (*)    Albumin 2.5 (*)    AST 55 (*)    All other components within normal limits  URINALYSIS, ROUTINE W REFLEX MICROSCOPIC - Abnormal; Notable for the following:    Hgb urine dipstick SMALL (*)    All other components within normal limits  RAPID URINE DRUG SCREEN,  HOSP PERFORMED - Abnormal; Notable for the following:    Benzodiazepines POSITIVE (*)    All other components within normal limits  APTT - Abnormal; Notable for the following:    aPTT 40 (*)    All other components within normal limits  PROTIME-INR - Abnormal; Notable for the following:  Prothrombin Time 17.2 (*)    All other components within normal limits  CK - Abnormal; Notable for the following:    Total CK 21 (*)    All other components within normal limits  HEPARIN LEVEL (UNFRACTIONATED) - Abnormal; Notable for the following:    Heparin Unfractionated 1.94 (*)    All other components within normal limits  CULTURE, BLOOD (ROUTINE X 2)  CULTURE, BLOOD (ROUTINE X 2)  ETHANOL  TROPONIN I  MAGNESIUM  PHOSPHORUS  VALPROIC ACID LEVEL  COMPREHENSIVE METABOLIC PANEL  CBC  HEPARIN LEVEL (UNFRACTIONATED)  APTT    EKG  EKG Interpretation  Date/Time:  Tuesday December 23 2016 13:31:54 EDT Ventricular Rate:  68 PR Interval:    QRS Duration: 117 QT Interval:  479 QTC Calculation: 510 R Axis:   36 Text Interpretation:  Sinus rhythm Short PR interval Incomplete right bundle branch block Baseline wander Abnormal ekg Confirmed by Carmin Muskrat [phone]) on 12/23/2016 2:22:24 PM       Radiology Dg Chest 2 View  Result Date: 12/23/2016 CLINICAL DATA:  Altered mental status EXAM: CHEST  2 VIEW COMPARISON:  09/18/2016 FINDINGS: Left pacer remains in place, unchanged. Heart is upper limits normal in size. Diffuse interstitial prominence within the lungs, stable since prior study and dating back to 2017, most likely chronic interstitial lung disease. No confluent opacities or effusions. IMPRESSION: Suspect chronic interstitial lung disease, stable. Borderline heart size. Electronically Signed   By: Rolm Baptise M.D.   On: 12/23/2016 14:18   Ct Head Wo Contrast  Result Date: 12/23/2016 CLINICAL DATA:  83 year old male with altered level of consciousness. Increased confusion, repetitive  questions, an while in transport to ED had tonic clonic seizure. EXAM: CT HEAD WITHOUT CONTRAST TECHNIQUE: Contiguous axial images were obtained from the base of the skull through the vertex without intravenous contrast. COMPARISON:  Head CT 05/06/2016. FINDINGS: Brain: Cerebral volume is stable and within normal limits for age. No midline shift, ventriculomegaly, mass effect, evidence of mass lesion, intracranial hemorrhage or evidence of cortically based acute infarction. Gray-white matter differentiation is within normal limits throughout the brain. Vascular: Calcified atherosclerosis at the skull base. No suspicious intracranial vascular hyperdensity. Skull: Hyperostosis of the skull, normal variant. No acute osseous abnormality identified. Sinuses/Orbits: New opacification of the left middle ear and mastoids. No definite mastoid coalescence or osseous erosion. Other visible paranasal sinuses and mastoids are stable and well pneumatized. Other: No acute orbit or scalp soft tissue findings. Negative visible nasopharynx and oropharynx. IMPRESSION: 1. Opacification of the left middle ear and mastoids is new since January. Consider acute otitis media. 2. Noncontrast CT appearance of the brain remains normal for age. Electronically Signed   By: Genevie Ann M.D.   On: 12/23/2016 14:06    Procedures Procedures (including critical care time)  Medications Ordered in ED Medications  sodium chloride 0.9 % bolus 500 mL (0 mLs Intravenous Stopped 12/23/16 1559)    Followed by  0.9 %  sodium chloride infusion (100 mL/hr Intravenous New Bag/Given 12/23/16 2016)  azithromycin (ZITHROMAX) 500 mg in dextrose 5 % 250 mL IVPB (0 mg Intravenous Stopped 12/23/16 1910)  sodium chloride flush (NS) 0.9 % injection 3 mL (not administered)  acetaminophen (TYLENOL) tablet 650 mg (not administered)    Or  acetaminophen (TYLENOL) suppository 650 mg (not administered)  valproate (DEPACON) 1,400 mg in dextrose 5 % 50 mL IVPB (not  administered)  valproate (DEPACON) 500 mg in dextrose 5 % 50 mL IVPB (not administered)  LORazepam (ATIVAN) injection 1 mg (not administered)  heparin ADULT infusion 100 units/mL (25000 units/252mL sodium chloride 0.45%) (not administered)  iopamidol (ISOVUE-370) 76 % injection (100 mLs  Contrast Given 12/23/16 1928)  LORazepam (ATIVAN) 2 MG/ML injection (1 mg  Given 12/23/16 1800)     Initial Impression / Assessment and Plan / ED Course  I have reviewed the triage vital signs and the nursing notes.  Pertinent labs & imaging results that were available during my care of the patient were reviewed by me and considered in my medical decision making (see chart for details).     The patient's presentation mental status from baseline and seizure, is most likely suffering from a stroke versus seizure versus infection(such as UTI, pneumonia) versus medication induced psychosis versus progression of his dementia. Complete evaluation CT noncontrast of the head, CBC, CMP, UA, APTT, ethanol, troponin, INR, chest x-ray, and EKG have been ordered.  1:40 PM, patient evaluated by Dr. Vanita Panda who agrees with current evaluation and plan.  2:55 PM, patient's labs returned unremarkable. UDS negative except for benzodiazepine as predicted. CBC remarkable only for slightly improved anemia as compared to previous Hgb's, CT head without contrast was absent significant change from previous studies. Patient continues to cycle through episodes of disoriented/paranoid periods with near typical speech only to again end with episodes of where he can only answer yes/no questions with a nod due to  Broca's aphasia(word salad) wherein the patient can only utter partial words in a highly repetitive form.  3:20 PM,Call placed to patient's son who was listed as emergency contact, a Reilley Latorre. at [phone]. Son states that his father typically is able to communicate effectively at baseline. Call dropped will return call to  son planned following call to neurology and hospital team.   3:25 PM, Patient repeating words greater than 20 plus attempts again. Was stopped and asked yes/so questions to which he appropriately responds.  3:29 PM, call placed to neurology, stated they will see the patient ASAP. Neurology PA arrived and ordered EEG stat after observing patient behavior. Patient was able to name a watch band, and many other items as well as following commands. Neurology PA noted that the patient was now able to state where he was located, a hospital. He had returned to his admission status at that time. Son informed Dr. Vanita Panda that the patient has become rather paranoid of recent, but denied hostility, or similar symptoms to his current presentation.  Patient later developed the Broca type aphasia and was observed on EEG.  Patient was admitted to [facility] for completion of his evaluation with Neurology following closely. We greatly appreciated their assistance.   Patient seen and evaluated with Dr. Vanita Panda.   Final Clinical Impressions(s) / ED Diagnoses   Final diagnoses:  Altered mental status, unspecified altered mental status type  Confusion  Seizures [facility])  Dysarthria   New Prescriptions Current Discharge Medication List       Kathi Ludwig, MD 12/23/16 2028    Carmin Muskrat, MD 12/26/16 [phone]

## 2016-12-23 NOTE — ED Triage Notes (Signed)
Pt arrives via EMs from [facility] where staff called because patient had an episode of increased confusion, aphasia, with repetitive questions. No unilateral weakness noted. Pt alert, oriented to self. In transport had tonic clonic seizure which consisted of full body jerking, right sided gaze preference, tongue injury noted. VSS. 18g LFA.

## 2016-12-23 NOTE — Progress Notes (Signed)
STAT EEG completed; results pending. 

## 2016-12-23 NOTE — Progress Notes (Signed)
Patient arrived to unit.  Transferred from stretcher to bed.  Alert, verbal with confusion.  During assessment, patient began to shake, then immediately went unresponsive, patient with pulse.  Code blue called in, patient given IV Ativan.  Patient currently resting in bed, alert with confusion, some combativeness, attempt to pull out lines.  Family notified by telephone.  Spoke with son who is out of town, son stated wife is on way to hospital to be with patient.  Sons number where he can be reached is [phone].

## 2016-12-24 ENCOUNTER — Encounter (HOSPITAL_COMMUNITY): Payer: Self-pay | Admitting: *Deleted

## 2016-12-24 DIAGNOSIS — E785 Hyperlipidemia, unspecified: Secondary | ICD-10-CM | POA: Diagnosis present

## 2016-12-24 DIAGNOSIS — R569 Unspecified convulsions: Secondary | ICD-10-CM | POA: Diagnosis not present

## 2016-12-24 DIAGNOSIS — H6692 Otitis media, unspecified, left ear: Secondary | ICD-10-CM | POA: Diagnosis present

## 2016-12-24 DIAGNOSIS — Z880 Allergy status to penicillin: Secondary | ICD-10-CM | POA: Diagnosis not present

## 2016-12-24 DIAGNOSIS — E860 Dehydration: Secondary | ICD-10-CM | POA: Diagnosis present

## 2016-12-24 DIAGNOSIS — Z87891 Personal history of nicotine dependence: Secondary | ICD-10-CM | POA: Diagnosis not present

## 2016-12-24 DIAGNOSIS — J4520 Mild intermittent asthma, uncomplicated: Secondary | ICD-10-CM

## 2016-12-24 DIAGNOSIS — Z79899 Other long term (current) drug therapy: Secondary | ICD-10-CM | POA: Diagnosis not present

## 2016-12-24 DIAGNOSIS — Z955 Presence of coronary angioplasty implant and graft: Secondary | ICD-10-CM | POA: Diagnosis not present

## 2016-12-24 DIAGNOSIS — I110 Hypertensive heart disease with heart failure: Secondary | ICD-10-CM | POA: Diagnosis present

## 2016-12-24 DIAGNOSIS — K219 Gastro-esophageal reflux disease without esophagitis: Secondary | ICD-10-CM | POA: Diagnosis present

## 2016-12-24 DIAGNOSIS — I5032 Chronic diastolic (congestive) heart failure: Secondary | ICD-10-CM | POA: Diagnosis present

## 2016-12-24 DIAGNOSIS — G9340 Encephalopathy, unspecified: Secondary | ICD-10-CM | POA: Diagnosis not present

## 2016-12-24 DIAGNOSIS — R410 Disorientation, unspecified: Secondary | ICD-10-CM | POA: Diagnosis present

## 2016-12-24 DIAGNOSIS — J449 Chronic obstructive pulmonary disease, unspecified: Secondary | ICD-10-CM | POA: Diagnosis present

## 2016-12-24 DIAGNOSIS — Z950 Presence of cardiac pacemaker: Secondary | ICD-10-CM | POA: Diagnosis not present

## 2016-12-24 DIAGNOSIS — F0390 Unspecified dementia without behavioral disturbance: Secondary | ICD-10-CM | POA: Diagnosis present

## 2016-12-24 DIAGNOSIS — Z8249 Family history of ischemic heart disease and other diseases of the circulatory system: Secondary | ICD-10-CM | POA: Diagnosis not present

## 2016-12-24 DIAGNOSIS — Z7901 Long term (current) use of anticoagulants: Secondary | ICD-10-CM | POA: Diagnosis not present

## 2016-12-24 DIAGNOSIS — G4089 Other seizures: Secondary | ICD-10-CM | POA: Diagnosis present

## 2016-12-24 DIAGNOSIS — I480 Paroxysmal atrial fibrillation: Secondary | ICD-10-CM | POA: Diagnosis present

## 2016-12-24 DIAGNOSIS — I2510 Atherosclerotic heart disease of native coronary artery without angina pectoris: Secondary | ICD-10-CM | POA: Diagnosis present

## 2016-12-24 DIAGNOSIS — R4701 Aphasia: Secondary | ICD-10-CM | POA: Diagnosis present

## 2016-12-24 LAB — COMPREHENSIVE METABOLIC PANEL
ALT: 19 U/L (ref 17–63)
AST: 47 U/L — AB (ref 15–41)
Albumin: 2.2 g/dL — ABNORMAL LOW (ref 3.5–5.0)
Alkaline Phosphatase: 87 U/L (ref 38–126)
Anion gap: 4 — ABNORMAL LOW (ref 5–15)
BILIRUBIN TOTAL: 0.6 mg/dL (ref 0.3–1.2)
BUN: 9 mg/dL (ref 6–20)
CHLORIDE: 107 mmol/L (ref 101–111)
CO2: 28 mmol/L (ref 22–32)
Calcium: 8.3 mg/dL — ABNORMAL LOW (ref 8.9–10.3)
Creatinine, Ser: 0.58 mg/dL — ABNORMAL LOW (ref 0.61–1.24)
GFR calc Af Amer: >60 mL/min (ref >60)
GFR calc non Af Amer: >60 mL/min (ref >60)
GLUCOSE: 103 mg/dL — AB (ref 65–99)
POTASSIUM: 3.6 mmol/L (ref 3.5–5.1)
Sodium: 139 mmol/L (ref 135–145)
TOTAL PROTEIN: 6.9 g/dL (ref 6.5–8.1)

## 2016-12-24 LAB — CBC
HEMATOCRIT: 30.6 % — AB (ref 39.0–52.0)
Hemoglobin: 9.7 g/dL — ABNORMAL LOW (ref 13.0–17.0)
MCH: 27.3 pg (ref 26.0–34.0)
MCHC: 31.7 g/dL (ref 30.0–36.0)
MCV: 86.2 fL (ref 78.0–100.0)
Platelets: 288 10*3/uL (ref 150–400)
RBC: 3.55 MIL/uL — ABNORMAL LOW (ref 4.22–5.81)
RDW: 18.5 % — AB (ref 11.5–15.5)
WBC: 7.2 10*3/uL (ref 4.0–10.5)

## 2016-12-24 LAB — HEPARIN LEVEL (UNFRACTIONATED): HEPARIN UNFRACTIONATED: 0.52 [IU]/mL (ref 0.30–0.70)

## 2016-12-24 LAB — VALPROIC ACID LEVEL: Valproic Acid Lvl: 53 ug/mL (ref 50.0–100.0)

## 2016-12-24 LAB — APTT
APTT: 43 s — AB (ref 24–36)
aPTT: 36 seconds (ref 24–36)

## 2016-12-24 MED ORDER — DIVALPROEX SODIUM 500 MG PO DR TAB
750.0000 mg | DELAYED_RELEASE_TABLET | Freq: Two times a day (BID) | ORAL | Status: DC
  Administered 2016-12-24 – 2016-12-25 (×2): 750 mg via ORAL
  Filled 2016-12-24 (×2): qty 1

## 2016-12-24 MED ORDER — APIXABAN 5 MG PO TABS
5.0000 mg | ORAL_TABLET | Freq: Two times a day (BID) | ORAL | Status: DC
  Administered 2016-12-24 – 2016-12-25 (×2): 5 mg via ORAL
  Filled 2016-12-24 (×2): qty 1

## 2016-12-24 MED ORDER — ALBUTEROL SULFATE (2.5 MG/3ML) 0.083% IN NEBU: 2.5000 mg | INHALATION_SOLUTION | RESPIRATORY_TRACT | Status: DC | PRN

## 2016-12-24 NOTE — Progress Notes (Signed)
Pt calm in bed with no c/o, visiting with family, NP Lethea Killings paged to clarify need for Step down bed as ordered, called back and said she spoke with Dr Myna Hidalgo who originally placed the order, transfer order was cancelled and diet ordered for pt, same related back to pt and family, safety sitter at bedside, will continue to monitor. Ob[place]-Asidi, Ysidro Ramsay Efe

## 2016-12-24 NOTE — Progress Notes (Signed)
Subjective: Patient feels back to his baseline. Daughter-in-law who is at bedside also states that he is back to his baseline. He has had no further seizures overnight. He is currently on Depakote 500 mg twice a day. He states he is having no side effects on this medication.  Exam: Vitals:   12/24/16 0543 12/24/16 0830  BP: 119/60 (!) 110/52  Pulse: 64 64  Resp:  18  Temp: 98.8 F (37.1 C) 98 F (36.7 C)  SpO2: 96% 97%    HEENT-  Normocephalic, no lesions, without obvious abnormality.  Normal external eye and conjunctiva.  Normal TM's bilaterally.  Normal auditory canals and external ears. Normal external nose, mucus membranes and septum.  Normal pharynx. Cardiovascular- S1, S2 normal, pulses palpable throughout   Lungs- chest clear, no wheezing, rales, normal symmetric air entry, Heart exam - S1, S2 normal, no murmur, no gallop, rate regular Abdomen- normal findings: bowel sounds normal Extremities- no edema Lymph-no adenopathy palpable Musculoskeletal-no joint tenderness, deformity or swelling Skin-warm and dry, no hyperpigmentation, vitiligo, or suspicious lesions   Neuro:  CN: Pupils are equal and round. They are symmetrically reactive from 3-->2 mm. EOMI without nystagmus. Facial sensation is intact to light touch. Face is symmetric at rest with normal strength and mobility. Hearing is intact to conversational voice. Palate elevates symmetrically and uvula is midline. Voice is normal in tone, pitch and quality. Bilateral SCM and trapezii are 5/5. Tongue is midline with normal bulk and mobility.  Motor: Normal bulk, tone, and strength. 5/5 throughout. No drift.  Sensation: Intact to light touch.  DTRs: 2+, symmetric  Toes downgoing bilaterally. No pathologic reflexes.  Coordination: Finger-to-nose and heel-to-shin are without dysmetria     Pertinent Labs/Diagnostics: EEG showed no epileptiform activity   IMPRESSION CTA and Perfusion 1. 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 There is a moderate degree of probably retrograde reconstituted enhancement of a diminutive and atherosclerotic appearing right V4 segment. 2. CT perfusion does not detect core infarct, and no intracranial artery occlusion or significant significant arterial stenosis is identified. 3. Up to moderate stenosis at the dominant left vertebral artery origin. 4. No carotid stenosis in the neck despite calcified plaque. 5. Stable CT appearance of the brain since 1349 hours today. Etta Quill PA-C [facility] [phone]  Impression: Complex partial seizures/complex tonic-clonic seizures. Unfortunately patient is unable to have an MRI however CTA and CT perfusion were normal.   He returned to normal fairly quickly after treatment for seizures. With no other clear etiology, I suspect that this represents seizures secondary to dementia. He will need long term AED therapy.   Recommendations: 1) continue Depakote at 750 mg twice a day.  2) patient will need further follow-up with outpatient neurology. 3) no further recommendations at this time, please call with further questions or concerns.  Roland Rack, MD [facility] [phone]  If 7pm- 7am, please page neurology on call as listed in [place].  12/24/2016, 10:45 AM

## 2016-12-24 NOTE — Progress Notes (Signed)
ANTICOAGULATION CONSULT NOTE - Initial Consult  Pharmacy Consult for Heparin. Indication: paroxysmal atrial fibrillation  Allergies  Allergen Reactions  . Amiodarone Nausea And Vomiting  . Bactrim [Sulfamethoxazole-Trimethoprim] Other (See Comments)    Unknown reaction. Listed on MAR  . Penicillins Hives, Swelling and Rash    Patient Measurements: Height: 5\' 10"  (177.8 cm) Weight: 168 lb 11.2 oz (76.5 kg) IBW/kg (Calculated) : 73 Heparin Dosing Weight: 76.5 kg  Assessment: 83 yo male PMH of paroxysmal atrial fibrillation on Eliquis PTA (last dose 8/28 at 8 AM) On hold and changing to IV Heparin. Heparin level remains falsely elevated but down to 0.52. APTT is low at 43. CHADsVasc = 5. Hgb down to 9.7, plts wnl.  Goal of Therapy:  APTT 66-102 Heparin level 0.3-0.7 units/mL Monitor platelets by anticoagulation protocol: Yes   Plan:  Increase heparin gtt to 1,200 units/hr Monitor daily heparin level / aPTT, CBC, s/s of bleed  Elenor Quinones, PharmD, [facility] Clinical Pharmacist Pager [phone] 12/24/2016 9:42 AM

## 2016-12-24 NOTE — Progress Notes (Signed)
Pt IV became dislodged during therapy, began bleeding. Removed IV and applied pressure until bleeding stopped. Dressed with 4x4 gauze. Discontinued heparin and contacted pharmacy. MD paged. Dorina Hoyer, RN

## 2016-12-24 NOTE — Progress Notes (Signed)
I went by patients room at 12.30 am, patient was asleep. Spoke to wife who said patient was back to baseline and has had no further seizures.

## 2016-12-24 NOTE — Evaluation (Signed)
Physical Therapy Evaluation Patient Details Name: Steve Norris MRN: [id number] DOB: April 16, 1930 Today's Date: 12/24/2016   History of Present Illness  pt is an 83 y/o male with pmh significant for HTN, CAD, d CFH, parax. afib, dementia admitted from [facility] with worsening confusion and ?aphasia.  En route to the hospital EMS observed tonic-clonic seizure with right gaze preference/tongue biting.  Clinical Impression  Pt admitted with/for complications and confusion above.  Pt presently needing min guard to min assist for mobility.  Pt currently limited functionally due to the problems listed below.  (see problems list.)  Pt will benefit from PT to maximize function and safety to be able to get home safely with available assist.     Follow Up Recommendations No PT follow up    Equipment Recommendations  None recommended by PT    Recommendations for Other Services       Precautions / Restrictions Precautions Precautions: Fall      Mobility  Bed Mobility Overal bed mobility: Needs Assistance Bed Mobility: Supine to Sit     Supine to sit: Supervision        Transfers Overall transfer level: Needs assistance   Transfers: Sit to/from Stand Sit to Stand: Min assist;Min guard (min from low toilet)            Ambulation/Gait Ambulation/Gait assistance: Min assist;Min guard Ambulation Distance (Feet): 15 Feet (x2) Assistive device: None;Rolling walker (2 wheeled) Gait Pattern/deviations: Step-through pattern   Gait velocity interpretation: Below normal speed for age/gender General Gait Details: generally steady with/without AD, limited to bathroom and back today  Stairs            Wheelchair Mobility    Modified Rankin (Stroke Patients Only)       Balance Overall balance assessment: Needs assistance   Sitting balance-Leahy Scale: Good       Standing balance-Leahy Scale: Fair                               Pertinent  Vitals/Pain Pain Assessment: No/denies pain    Home Living Family/patient expects to be discharged to:: Other (Comment) ([facility])                      Prior Function Level of Independence: Needs assistance   Gait / Transfers Assistance Needed: per dtr in law, pt will lay in bed inactive if allowed; staff come get pt to go to dining hall, walk etc.  pt needs no assist to walk generally  ADL's / Homemaking Assistance Needed: Dtr in law not sure about ADL's        Hand Dominance        Extremity/Trunk Assessment   Upper Extremity Assessment Upper Extremity Assessment: Overall WFL for tasks assessed    Lower Extremity Assessment Lower Extremity Assessment: Overall WFL for tasks assessed (l sided proximal weakness)       Communication   Communication: No difficulties  Cognition Arousal/Alertness: Awake/alert Behavior During Therapy: Flat affect;Restless Overall Cognitive Status: History of cognitive impairments - at baseline                                 General Comments: pt's daughter in law states he is close to baseline      General Comments General comments (skin integrity, edema, etc.): sats 93% on  RA up to 96% on 2L    Exercises     Assessment/Plan    PT Assessment Patient needs continued PT services  PT Problem List Decreased strength;Decreased activity tolerance;Decreased balance;Decreased mobility;Decreased cognition       PT Treatment Interventions Gait training;Functional mobility training;Therapeutic activities;Balance training;Patient/family education    PT Goals (Current goals can be found in the Care Plan section)  Acute Rehab PT Goals Patient Stated Goal: pt unable PT Goal Formulation: Patient unable to participate in goal setting Time For Goal Achievement: Jan 07, 2017 Potential to Achieve Goals: Good    Frequency Min 2X/week   Barriers to discharge        Co-evaluation               AM-PAC  PT "6 Clicks" Daily Activity  Outcome Measure Difficulty turning over in bed (including adjusting bedclothes, sheets and blankets)?: None Difficulty moving from lying on back to sitting on the side of the bed? : None Difficulty sitting down on and standing up from a chair with arms (e.g., wheelchair, bedside commode, etc,.)?: A Little Help needed moving to and from a bed to chair (including a wheelchair)?: A Little Help needed walking in hospital room?: A Little Help needed climbing 3-5 steps with a railing? : A Lot 6 Click Score: 19    End of Session   Activity Tolerance: Patient tolerated treatment well Patient left: in chair;with call bell/phone within reach;with chair alarm set Nurse Communication: Mobility status PT Visit Diagnosis: Unsteadiness on feet (R26.81);Other symptoms and signs involving the nervous system (R29.898)    Time: 9833-8250 PT Time Calculation (min) (ACUTE ONLY): 43 min   Charges:   PT Evaluation $PT Eval Moderate Complexity: 1 Mod PT Treatments $Gait Training: 8-22 mins $Therapeutic Activity: 8-22 mins   PT G Codes:        01-07-2017  Steve Norris, PT [phone]  (pager)  [name] 01-07-17, 6:05 PM

## 2016-12-24 NOTE — Progress Notes (Signed)
PROGRESS NOTE    Steve Norris  EZM:[id number] DOB: [birth year] DOA: 12/23/2016 PCP: Deland Pretty, MD    Brief Narrative: Steve Norris is a 83 y.o. male with medical history significant for hypertension, CAD, grade 2, diastolic heart failure, paroxysmal atrial fibrillation on eliquis, asthma, dementia, dyslipidemia, GERD and Barrett's esophagitis, comes in from ALF for altered mental status, new onset seizures, was started on depakon and his seizures are controlled.  Neurology consulted.  Assessment & Plan:   Principal Problem:   New onset seizure ([place]) Active Problems:   Acute left otitis media   Acute encephalopathy   PAF (paroxysmal atrial fibrillation) (HCC)   Benign essential HTN   Chronic diastolic heart failure, NYHA class 2 (HCC)   Hyperlipidemia   Dementia   Asthma   Elevated AST (SGOT)   New Onset Seizures:  Witnessed seizure yesterday, EEG done , unremarkable.  Started on depacon and he is much better, no seizure activity last night or this am.  Unclear etiology.  CT Angio of the head and neck reviewed.  Neurology consulted and recommendations given.    Acute otitis media: On IV zithromax.    PAF:  Rate controlled. On sotalol for rate control. On eliquis , which was held and on IV heparin instead. Should be able to transition to oral eliquis by the end of the day.    Hypertension; well controlled.    Chronic diastolic heart failure:  He appears Euvolemic.    Dementia:  No agitation, currently residing in memory care unit.    Hyperlipidemia  On crestor.   Asthma:  Some wheezing heard and prn albuterol added.   Acute encephalopathy:  Possibly post ictal from seizures.  Resolved, he is alert and oriented now.     DVT prophylaxis: heparin gtt.  Code Status: full code.  Family Communication: discussed with family at bedside.  Disposition Plan: pending further evaluation by neurology and PT.    Consultants:   Neurology.    Procedures:  EEG,    Antimicrobials: Zithromax.    Subjective: No new complaints. No seizures last night or this am.   Objective: Vitals:   12/23/16 2000 12/24/16 0128 12/24/16 0543 12/24/16 0830  BP: 109/61 125/62 119/60 (!) 110/52  Pulse: 67 69 64 64  Resp: 18 18  18   Temp: 97.6 F (36.4 C) 98.3 F (36.8 C) 98.8 F (37.1 C) 98 F (36.7 C)  TempSrc: Oral Oral Oral Oral  SpO2: 99% 96% 96% 97%  Weight: 76.5 kg (168 lb 11.2 oz)     Height: 5\' 10"  (1.778 m)       Intake/Output Summary (Last 24 hours) at 12/24/16 1008 Last data filed at 12/24/16 0700  Gross per 24 hour  Intake          3424.46 ml  Output             1550 ml  Net          1874.46 ml   Filed Weights   12/23/16 2000  Weight: 76.5 kg (168 lb 11.2 oz)    Examination:  General exam: Appears calm and comfortable  Respiratory system: Clear to auscultation. Respiratory effort normal. Cardiovascular system: S1 & S2 heard, RRR. No JVD, murmurs, rubs, gallops or clicks. No pedal edema. Gastrointestinal system: Abdomen is nondistended, soft and nontender. No organomegaly or masses felt. Normal bowel sounds heard. Central nervous system: Alert and oriented. No focal neurological deficits. Extremities: Symmetric 5 x 5 power. Skin: No rashes, lesions  or ulcers Psychiatry: Judgement and insight appear normal. Mood & affect appropriate.     Data Reviewed: I have personally reviewed following labs and imaging studies  CBC:  Recent Labs Lab 12/23/16 1421 12/24/16 0808  WBC 7.6 7.2  HGB 11.3* 9.7*  HCT 34.7* 30.6*  MCV 85.5 86.2  PLT 321 981   Basic Metabolic Panel:  Recent Labs Lab 12/23/16 1421 12/24/16 0808  NA 136 139  K 4.3 3.6  CL 100* 107  CO2 28 28  GLUCOSE 108* 103*  BUN 12 9  CREATININE 0.59* 0.58*  CALCIUM 8.7* 8.3*  MG 1.9  --   PHOS 3.6  --    GFR: Estimated Creatinine Clearance: 69.7 mL/min (A) (by C-G formula based on SCr of 0.58 mg/dL (L)). Liver Function Tests:  Recent Labs Lab  12/23/16 1421 12/24/16 0808  AST 55* 47*  ALT 23 19  ALKPHOS 104 87  BILITOT 0.5 0.6  PROT 7.8 6.9  ALBUMIN 2.5* 2.2*   No results for input(s): LIPASE, AMYLASE in the last 168 hours. No results for input(s): AMMONIA in the last 168 hours. Coagulation Profile:  Recent Labs Lab 12/23/16 1421  INR 1.42   Cardiac Enzymes:  Recent Labs Lab 12/23/16 1421  CKTOTAL 21*  TROPONINI <0.03   BNP (last 3 results) No results for input(s): PROBNP in the last 8760 hours. HbA1C: No results for input(s): HGBA1C in the last 72 hours. CBG: No results for input(s): GLUCAP in the last 168 hours. Lipid Profile: No results for input(s): CHOL, HDL, LDLCALC, TRIG, CHOLHDL, LDLDIRECT in the last 72 hours. Thyroid Function Tests: No results for input(s): TSH, T4TOTAL, FREET4, T3FREE, THYROIDAB in the last 72 hours. Anemia Panel: No results for input(s): VITAMINB12, FOLATE, FERRITIN, TIBC, IRON, RETICCTPCT in the last 72 hours. Sepsis Labs: No results for input(s): PROCALCITON, LATICACIDVEN in the last 168 hours.  No results found for this or any previous visit (from the past 240 hour(s)).       Radiology Studies: Ct Angio Head W Or Wo Contrast  Result Date: 12/23/2016 CLINICAL DATA:  83 year old male with altered mental status, tonic chronic seizure. EXAM: CT ANGIOGRAPHY HEAD AND NECK CT PERFUSION BRAIN TECHNIQUE: Multidetector CT imaging of the head and neck was performed using the standard protocol during bolus administration of intravenous contrast. Multiplanar CT image reconstructions and MIPs were obtained to evaluate the vascular anatomy. Carotid stenosis measurements (when applicable) are obtained utilizing NASCET criteria, using the distal internal carotid diameter as the denominator. Multiphase CT imaging of the brain was performed following IV bolus contrast injection. Subsequent parametric perfusion maps were calculated using RAPID software. CONTRAST:  100 mL Isovue 370 COMPARISON:   Head CT without contrast 1349 hours today. FINDINGS: CT Brain Perfusion Findings: CBF (<30%) Volume: 0 mL Perfusion (Tmax>6.0s) volume: 4 mL (left anterior inferior frontal gyrus) Mismatch Volume: 4 mL Infarction Location:Not applicable CTA NECK Skeleton: No acute osseous abnormality identified. Upper chest: Left chest left subclavian transvenous cardiac pacemaker type device. Centrilobular emphysema. Calcified aortic atherosclerosis. No superior mediastinal lymphadenopathy. Other neck: Negative.  No cervical lymphadenopathy. Aortic arch: Moderate calcified arch atherosclerosis. Three vessel arch configuration with calcified proximal great vessel plaque. Extensive paravertebral and bilateral shoulder contrast venous reflux which appears related to left subclavian vein stenosis, left upper extremity contrast injection. Right carotid system: No brachiocephalic or right CCA origin stenosis despite calcified plaque. Mild calcified plaque at the right carotid bifurcation, right ICA origin and bulb without stenosis. Moderate to severe tortuosity of the  right ICA just below the skullbase. Left carotid system: No left CCA origin stenosis despite calcified plaque. Minimal plaque at the left carotid bifurcation. Mild to moderate calcified plaque at the distal left ICA bulb with 50% or less stenosis with respect to the distal vessel. Moderate to severe tortuosity of the left ICA just below the skullbase. Vertebral arteries:No proximal right subclavian artery stenosis despite plaque. Occluded right vertebral artery origin (series 6, image 41). Faint thread-like enhancement of the distal right V 2 segment is visible, but the vessel then appears occluded at the skull base. No proximal left subclavian artery stenosis despite plaque. Soft and calcified plaque at the left vertebral artery origin with moderate stenosis (series 8, image 178). Dominant appearing left vertebral artery is patent to the skullbase without additional  stenosis. CTA HEAD Posterior circulation: Dominant distal left vertebral artery supplies the basilar without stenosis. Patent left PICA origin. Probable retrograde enhancement of a diminutive and irregular right V4 segment to the level of the right PICA. Patent basilar artery without stenosis. Normal SCA and PCA origins. Posterior communicating arteries are diminutive or absent. Bilateral PCA branches are within normal limits. Anterior circulation: Patent ICA siphons with tortuosity and mild to moderate calcified plaque. Mild left supraclinoid and right cavernous ICA stenosis. Patent carotid termini, MCA and ACA origins. A1 segments, anterior communicating artery and bilateral ACA branches are within normal limits. Left MCA M1 segment, left MCA bifurcation, and left MCA branches are within normal limits. Right MCA M1 segment, trifurcation, and right MCA branches are within normal limits. Venous sinuses: Appear patent on delayed imaging. Anatomic variants: Dominant appearing left vertebral artery. Delayed phase: No abnormal enhancement identified. Stable gray-white matter differentiation throughout the brain. Review of the MIP images confirms the above findings IMPRESSION: 1. 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 There is a moderate degree of probably retrograde reconstituted enhancement of a diminutive and atherosclerotic appearing right V4 segment. 2. CT perfusion does not detect core infarct, and no intracranial artery occlusion or significant significant arterial stenosis is identified. 3. Up to moderate stenosis at the dominant left vertebral artery origin. 4. No carotid stenosis in the neck despite calcified plaque. 5. Stable CT appearance of the brain since 1349 hours today. 6. Aortic Atherosclerosis (ICD10-I70.0) and Emphysema (ICD10-J43.9). 7. Salient findings were relayed via text pager to Dr. Chauncey Cruel. Aroor on 12/23/2016 at 20:31 . Electronically Signed   By: Genevie Ann M.D.   On:  12/23/2016 20:33   Dg Chest 2 View  Result Date: 12/23/2016 CLINICAL DATA:  Altered mental status EXAM: CHEST  2 VIEW COMPARISON:  09/18/2016 FINDINGS: Left pacer remains in place, unchanged. Heart is upper limits normal in size. Diffuse interstitial prominence within the lungs, stable since prior study and dating back to 2017, most likely chronic interstitial lung disease. No confluent opacities or effusions. IMPRESSION: Suspect chronic interstitial lung disease, stable. Borderline heart size. Electronically Signed   By: Rolm Baptise M.D.   On: 12/23/2016 14:18   Ct Head Wo Contrast  Result Date: 12/23/2016 CLINICAL DATA:  83 year old male with altered level of consciousness. Increased confusion, repetitive questions, an while in transport to ED had tonic clonic seizure. EXAM: CT HEAD WITHOUT CONTRAST TECHNIQUE: Contiguous axial images were obtained from the base of the skull through the vertex without intravenous contrast. COMPARISON:  Head CT 05/06/2016. FINDINGS: Brain: Cerebral volume is stable and within normal limits for age. No midline shift, ventriculomegaly, mass effect, evidence of mass lesion, intracranial hemorrhage or evidence  of cortically based acute infarction. Gray-white matter differentiation is within normal limits throughout the brain. Vascular: Calcified atherosclerosis at the skull base. No suspicious intracranial vascular hyperdensity. Skull: Hyperostosis of the skull, normal variant. No acute osseous abnormality identified. Sinuses/Orbits: New opacification of the left middle ear and mastoids. No definite mastoid coalescence or osseous erosion. Other visible paranasal sinuses and mastoids are stable and well pneumatized. Other: No acute orbit or scalp soft tissue findings. Negative visible nasopharynx and oropharynx. IMPRESSION: 1. Opacification of the left middle ear and mastoids is new since January. Consider acute otitis media. 2. Noncontrast CT appearance of the brain remains  normal for age. Electronically Signed   By: Genevie Ann M.D.   On: 12/23/2016 14:06   Ct Angio Neck W Or Wo Contrast  Result Date: 12/23/2016 CLINICAL DATA:  83 year old male with altered mental status, tonic chronic seizure. EXAM: CT ANGIOGRAPHY HEAD AND NECK CT PERFUSION BRAIN TECHNIQUE: Multidetector CT imaging of the head and neck was performed using the standard protocol during bolus administration of intravenous contrast. Multiplanar CT image reconstructions and MIPs were obtained to evaluate the vascular anatomy. Carotid stenosis measurements (when applicable) are obtained utilizing NASCET criteria, using the distal internal carotid diameter as the denominator. Multiphase CT imaging of the brain was performed following IV bolus contrast injection. Subsequent parametric perfusion maps were calculated using RAPID software. CONTRAST:  100 mL Isovue 370 COMPARISON:  Head CT without contrast 1349 hours today. FINDINGS: CT Brain Perfusion Findings: CBF (<30%) Volume: 0 mL Perfusion (Tmax>6.0s) volume: 4 mL (left anterior inferior frontal gyrus) Mismatch Volume: 4 mL Infarction Location:Not applicable CTA NECK Skeleton: No acute osseous abnormality identified. Upper chest: Left chest left subclavian transvenous cardiac pacemaker type device. Centrilobular emphysema. Calcified aortic atherosclerosis. No superior mediastinal lymphadenopathy. Other neck: Negative.  No cervical lymphadenopathy. Aortic arch: Moderate calcified arch atherosclerosis. Three vessel arch configuration with calcified proximal great vessel plaque. Extensive paravertebral and bilateral shoulder contrast venous reflux which appears related to left subclavian vein stenosis, left upper extremity contrast injection. Right carotid system: No brachiocephalic or right CCA origin stenosis despite calcified plaque. Mild calcified plaque at the right carotid bifurcation, right ICA origin and bulb without stenosis. Moderate to severe tortuosity of the  right ICA just below the skullbase. Left carotid system: No left CCA origin stenosis despite calcified plaque. Minimal plaque at the left carotid bifurcation. Mild to moderate calcified plaque at the distal left ICA bulb with 50% or less stenosis with respect to the distal vessel. Moderate to severe tortuosity of the left ICA just below the skullbase. Vertebral arteries:No proximal right subclavian artery stenosis despite plaque. Occluded right vertebral artery origin (series 6, image 41). Faint thread-like enhancement of the distal right V 2 segment is visible, but the vessel then appears occluded at the skull base. No proximal left subclavian artery stenosis despite plaque. Soft and calcified plaque at the left vertebral artery origin with moderate stenosis (series 8, image 178). Dominant appearing left vertebral artery is patent to the skullbase without additional stenosis. CTA HEAD Posterior circulation: Dominant distal left vertebral artery supplies the basilar without stenosis. Patent left PICA origin. Probable retrograde enhancement of a diminutive and irregular right V4 segment to the level of the right PICA. Patent basilar artery without stenosis. Normal SCA and PCA origins. Posterior communicating arteries are diminutive or absent. Bilateral PCA branches are within normal limits. Anterior circulation: Patent ICA siphons with tortuosity and mild to moderate calcified plaque. Mild left supraclinoid and right cavernous ICA stenosis.  Patent carotid termini, MCA and ACA origins. A1 segments, anterior communicating artery and bilateral ACA branches are within normal limits. Left MCA M1 segment, left MCA bifurcation, and left MCA branches are within normal limits. Right MCA M1 segment, trifurcation, and right MCA branches are within normal limits. Venous sinuses: Appear patent on delayed imaging. Anatomic variants: Dominant appearing left vertebral artery. Delayed phase: No abnormal enhancement identified. Stable  gray-white matter differentiation throughout the brain. Review of the MIP images confirms the above findings IMPRESSION: 1. 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 There is a moderate degree of probably retrograde reconstituted enhancement of a diminutive and atherosclerotic appearing right V4 segment. 2. CT perfusion does not detect core infarct, and no intracranial artery occlusion or significant significant arterial stenosis is identified. 3. Up to moderate stenosis at the dominant left vertebral artery origin. 4. No carotid stenosis in the neck despite calcified plaque. 5. Stable CT appearance of the brain since 1349 hours today. 6. Aortic Atherosclerosis (ICD10-I70.0) and Emphysema (ICD10-J43.9). 7. Salient findings were relayed via text pager to Dr. Chauncey Cruel. Aroor on 12/23/2016 at 20:31 . Electronically Signed   By: Genevie Ann M.D.   On: 12/23/2016 20:33   Ct Cerebral Perfusion W Contrast  Result Date: 12/23/2016 CLINICAL DATA:  83 year old male with altered mental status, tonic chronic seizure. EXAM: CT ANGIOGRAPHY HEAD AND NECK CT PERFUSION BRAIN TECHNIQUE: Multidetector CT imaging of the head and neck was performed using the standard protocol during bolus administration of intravenous contrast. Multiplanar CT image reconstructions and MIPs were obtained to evaluate the vascular anatomy. Carotid stenosis measurements (when applicable) are obtained utilizing NASCET criteria, using the distal internal carotid diameter as the denominator. Multiphase CT imaging of the brain was performed following IV bolus contrast injection. Subsequent parametric perfusion maps were calculated using RAPID software. CONTRAST:  100 mL Isovue 370 COMPARISON:  Head CT without contrast 1349 hours today. FINDINGS: CT Brain Perfusion Findings: CBF (<30%) Volume: 0 mL Perfusion (Tmax>6.0s) volume: 4 mL (left anterior inferior frontal gyrus) Mismatch Volume: 4 mL Infarction Location:Not applicable CTA NECK  Skeleton: No acute osseous abnormality identified. Upper chest: Left chest left subclavian transvenous cardiac pacemaker type device. Centrilobular emphysema. Calcified aortic atherosclerosis. No superior mediastinal lymphadenopathy. Other neck: Negative.  No cervical lymphadenopathy. Aortic arch: Moderate calcified arch atherosclerosis. Three vessel arch configuration with calcified proximal great vessel plaque. Extensive paravertebral and bilateral shoulder contrast venous reflux which appears related to left subclavian vein stenosis, left upper extremity contrast injection. Right carotid system: No brachiocephalic or right CCA origin stenosis despite calcified plaque. Mild calcified plaque at the right carotid bifurcation, right ICA origin and bulb without stenosis. Moderate to severe tortuosity of the right ICA just below the skullbase. Left carotid system: No left CCA origin stenosis despite calcified plaque. Minimal plaque at the left carotid bifurcation. Mild to moderate calcified plaque at the distal left ICA bulb with 50% or less stenosis with respect to the distal vessel. Moderate to severe tortuosity of the left ICA just below the skullbase. Vertebral arteries:No proximal right subclavian artery stenosis despite plaque. Occluded right vertebral artery origin (series 6, image 41). Faint thread-like enhancement of the distal right V 2 segment is visible, but the vessel then appears occluded at the skull base. No proximal left subclavian artery stenosis despite plaque. Soft and calcified plaque at the left vertebral artery origin with moderate stenosis (series 8, image 178). Dominant appearing left vertebral artery is patent to the skullbase without additional stenosis. CTA HEAD  Posterior circulation: Dominant distal left vertebral artery supplies the basilar without stenosis. Patent left PICA origin. Probable retrograde enhancement of a diminutive and irregular right V4 segment to the level of the right  PICA. Patent basilar artery without stenosis. Normal SCA and PCA origins. Posterior communicating arteries are diminutive or absent. Bilateral PCA branches are within normal limits. Anterior circulation: Patent ICA siphons with tortuosity and mild to moderate calcified plaque. Mild left supraclinoid and right cavernous ICA stenosis. Patent carotid termini, MCA and ACA origins. A1 segments, anterior communicating artery and bilateral ACA branches are within normal limits. Left MCA M1 segment, left MCA bifurcation, and left MCA branches are within normal limits. Right MCA M1 segment, trifurcation, and right MCA branches are within normal limits. Venous sinuses: Appear patent on delayed imaging. Anatomic variants: Dominant appearing left vertebral artery. Delayed phase: No abnormal enhancement identified. Stable gray-white matter differentiation throughout the brain. Review of the MIP images confirms the above findings IMPRESSION: 1. 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 There is a moderate degree of probably retrograde reconstituted enhancement of a diminutive and atherosclerotic appearing right V4 segment. 2. CT perfusion does not detect core infarct, and no intracranial artery occlusion or significant significant arterial stenosis is identified. 3. Up to moderate stenosis at the dominant left vertebral artery origin. 4. No carotid stenosis in the neck despite calcified plaque. 5. Stable CT appearance of the brain since 1349 hours today. 6. Aortic Atherosclerosis (ICD10-I70.0) and Emphysema (ICD10-J43.9). 7. Salient findings were relayed via text pager to Dr. Chauncey Cruel. Aroor on 12/23/2016 at 20:31 . Electronically Signed   By: Genevie Ann M.D.   On: 12/23/2016 20:33        Scheduled Meds: . LORazepam  1 mg Intravenous Once  . sodium chloride flush  3 mL Intravenous Q12H   Continuous Infusions: . sodium chloride 100 mL/hr (12/24/16 0554)  . azithromycin Stopped (12/23/16 1910)  .  heparin 1,200 Units/hr (12/24/16 0945)  . valproate sodium Stopped (12/24/16 0001)     LOS: 0 days    Time spent: 35 minutes.     Hosie Poisson, MD [facility] Pager [phone]  If 7PM-7AM, please contact night-coverage [email or web address] Password [email or web address] 12/24/2016, 10:08 AM

## 2016-12-24 NOTE — Evaluation (Signed)
Clinical/Bedside Swallow Evaluation Patient Details  Name: Steve Norris MRN: [id number] Date of Birth: 05-13-1929  Today's Date: 12/24/2016 Time: SLP Start Time (ACUTE ONLY): 1026 SLP Stop Time (ACUTE ONLY): 1034 SLP Time Calculation (min) (ACUTE ONLY): 8 min  Past Medical History:  Past Medical History:  Diagnosis Date  . Arthritis   . Asthma   . Barrett's esophagus 03-19-2009   EGD  . CAD (coronary artery disease)    a. s/p stent placement in 2006 to the LAD and RCA  . Cataract    REMOVED  . COPD (chronic obstructive pulmonary disease) ([place])   . Diverticulosis 03-19-2009   Colonoscopy   . Erosive esophagitis 03-19-2009   EGD  . GERD (gastroesophageal reflux disease)   . Hiatal hernia 03-19-2009   EGD  . Hyperlipidemia   . Hypertension   . Internal hemorrhoids 03-19-2009   Colonoscopy  . Paroxysmal atrial fibrillation (HCC)   . Sinus node dysfunction (HCC)    a. s/p PPM placement in 2011   Past Surgical History:  Past Surgical History:  Procedure Laterality Date  . CARDIAC CATHETERIZATION  02/12/2009   mild ostial left main disease,patent LAD & RCA stents   . CARDIOVERSION N/A 11/06/2014   Procedure: CARDIOVERSION;  Surgeon: [name], MD;  Location: [facility] ENDOSCOPY;  Service: Cardiovascular;  Laterality: N/A;  . CARDIOVERSION N/A 08/02/2015   Procedure: CARDIOVERSION;  Surgeon: Sueanne Margarita, MD;  Location: [facility] ENDOSCOPY;  Service: Cardiovascular;  Laterality: N/A;  . CATARACT EXTRACTION    . COLONOSCOPY    . CORONARY STENT PLACEMENT  2006   Right Coronary Artery Cypher stents placed 2006  . HEMORRHOID SURGERY    . LEFT HEART CATHETERIZATION WITH CORONARY ANGIOGRAM N/A 07/01/2013   Procedure: LEFT HEART CATHETERIZATION WITH CORONARY ANGIOGRAM;  Surgeon: Sanda Klein, MD;  Location: [facility] CATH LAB;  Service: Cardiovascular;  Laterality: N/A;  . NM MYOCAR PERF WALL MOTION  08/14/2008   no significant ischemia EF 64%  . PACEMAKER INSERTION  07/25/2009   [employer]ent   . TEE WITHOUT CARDIOVERSION N/A 08/02/2015   Procedure: TRANSESOPHAGEAL ECHOCARDIOGRAM (TEE);  Surgeon: Sueanne Margarita, MD;  Location: [facility] ENDOSCOPY;  Service: Cardiovascular;  Laterality: N/A;  . TONSILLECTOMY    . US ECHOCARDIOGRAPHY  07/12/2009   borderline LA enlargement,mild mitral annular ca+, AOV mildly sclerotic,trace AI.  Marland Kitchen VASECTOMY     HPI:  83 year old male with seizures and waxing/waning aphasia   Assessment / Plan / Recommendation Clinical Impression  Pt demonstrates adequate swallow function; tolerated thin liquid, puree and solid consistencies with no s/sx of aspiration. No SLP f/u/ needed, will sign off.  SLP Visit Diagnosis: Dysphagia, unspecified (R13.10)    Aspiration Risk       Diet Recommendation Regular;Thin liquid   Liquid Administration via: Cup;Straw Medication Administration: Whole meds with liquid Supervision: Patient able to self feed Compensations: Small sips/bites Postural Changes: Seated upright at 90 degrees    Other  Recommendations Oral Care Recommendations: Oral care BID   Follow up Recommendations Skilled Nursing facility      Frequency and Duration            Prognosis        Swallow Study   General HPI: 83 year old male with seizures and waxing/waning aphasia Type of Study: Bedside Swallow Evaluation Diet Prior to this Study: Regular;Thin liquids Temperature Spikes Noted: No Respiratory Status: Nasal cannula History of Recent Intubation: No Behavior/Cognition: Alert;Cooperative;Pleasant mood Oral Cavity Assessment: Within Functional Limits Oral Care Completed by  SLP: No Oral Cavity - Dentition: Adequate natural dentition;Dentures, bottom Vision: Functional for self-feeding Self-Feeding Abilities: Able to feed self Patient Positioning: Upright in bed Baseline Vocal Quality: Normal    Oral/Motor/Sensory Function     Ice Chips     Thin Liquid Thin Liquid: Within functional limits Presentation: Cup;Straw;Self Fed    Nectar  Thick Nectar Thick Liquid: Not tested   Honey Thick Honey Thick Liquid: Not tested   Puree Puree: Within functional limits Presentation: Self Fed;Spoon   Solid   GO   Solid: Within functional limits Presentation: [place], Student SLP 12/24/2016,11:13 AM

## 2016-12-25 LAB — CBC
HCT: 29.7 % — ABNORMAL LOW (ref 39.0–52.0)
Hemoglobin: 9.6 g/dL — ABNORMAL LOW (ref 13.0–17.0)
MCH: 28.2 pg (ref 26.0–34.0)
MCHC: 32.3 g/dL (ref 30.0–36.0)
MCV: 87.1 fL (ref 78.0–100.0)
PLATELETS: 272 10*3/uL (ref 150–400)
RBC: 3.41 MIL/uL — ABNORMAL LOW (ref 4.22–5.81)
RDW: 19.1 % — AB (ref 11.5–15.5)
WBC: 6.4 10*3/uL (ref 4.0–10.5)

## 2016-12-25 MED ORDER — AZITHROMYCIN 250 MG PO TABS: 500.0000 mg | ORAL_TABLET | Freq: Every day | ORAL | 0 refills | Status: AC

## 2016-12-25 MED ORDER — DIVALPROEX SODIUM 250 MG PO DR TAB: 750.0000 mg | DELAYED_RELEASE_TABLET | Freq: Two times a day (BID) | ORAL | 0 refills | Status: DC

## 2016-12-25 MED FILL — Medication: Qty: 1 | Status: AC

## 2016-12-25 NOTE — NC FL2 (Signed)
[place] LEVEL OF CARE SCREENING TOOL     IDENTIFICATION  Patient Name: Steve Norris Birthdate: December 23, 1929 Sex: male Admission Date (Current Location): 12/23/2016  [facility] and [place] Number:  Herbalist and Address:  The . [facility], [place] [address], [place], [place] [address]      Provider Number: [id number]  Attending Physician Name and Address:  Hosie Poisson, MD  Relative Name and Phone Number:       Current Level of Care: Hospital Recommended Level of Care: [place] Prior Approval Number:    Date Approved/Denied:   PASRR Number:    Discharge Plan: Other (Comment) (ALF)    Current Diagnoses: Patient Active Problem List   Diagnosis Date Noted  . New onset seizure ([place]) 12/23/2016  . Acute left otitis media 12/23/2016  . Acute encephalopathy 12/23/2016  . PAF (paroxysmal atrial fibrillation) ([place]) 12/23/2016  . Benign essential HTN 12/23/2016  . Chronic diastolic heart failure, NYHA class 2 ([place]) 12/23/2016  . Hyperlipidemia 12/23/2016  . Dementia 12/23/2016  . Asthma 12/23/2016  . Elevated AST (SGOT) 12/23/2016  . Acute diastolic CHF (congestive heart failure) ([place])   . Pressure injury of skin 08/09/2016  . BPH (benign prostatic hyperplasia) 08/09/2016  . HTN (hypertension) 08/09/2016  . CHF exacerbation ([place]) 08/08/2016  . Anemia 03/25/2016  . SSS (sick sinus syndrome) ([place]) 03/25/2016  . CAP (community acquired pneumonia)   . Atrial fibrillation with rapid ventricular response ([place]) 11/04/2014  . Dyspnea 12/15/2012  . Long term current use of anticoagulant therapy 07/14/2012  . COPD (chronic obstructive pulmonary disease) ([place]) 02/23/2012  . CAD (coronary artery disease) 02/23/2012  . S/P placement of cardiac pacemaker 02/23/2012  . Atrial fibrillation ([place]) 02/23/2012  . BARRETT'S ESOPHAGUS 04/30/2009    Orientation RESPIRATION BLADDER Height & Weight     Self, Place  Normal Continent  Weight: 169 lb (76.7 kg) Height:  5\' 10"  (177.8 cm)  BEHAVIORAL SYMPTOMS/MOOD NEUROLOGICAL BOWEL NUTRITION STATUS    Convulsions/Seizures Continent    AMBULATORY STATUS COMMUNICATION OF NEEDS Skin   Supervision Verbally Normal                       Personal Care Assistance Level of Assistance  Bathing, Dressing Bathing Assistance: Limited assistance   Dressing Assistance: Limited assistance     Functional Limitations Info             SPECIAL CARE FACTORS FREQUENCY                       Contractures      Additional Factors Info  Code Status, Allergies Code Status Info: Full Allergies Info: Amiodarone, Bactrim Sulfamethoxazole-trimethoprim, Penicillins           Current Medications (12/25/2016):  This is the current hospital active medication list Current Facility-Administered Medications  Medication Dose Route Frequency Provider Last Rate Last Dose  . 0.9 %  sodium chloride infusion  100 mL/hr Intravenous Continuous Carmin Muskrat, MD 100 mL/hr at 12/24/16 0554 100 mL/hr at 12/24/16 0554  . acetaminophen (TYLENOL) tablet 650 mg  650 mg Oral Q6H PRN Samella Parr, NP       Or  . acetaminophen (TYLENOL) suppository 650 mg  650 mg Rectal Q6H PRN Erin Hearing L, NP      . albuterol (PROVENTIL) (2.5 MG/3ML) 0.083% nebulizer solution 2.5 mg  2.5 mg Nebulization Q2H PRN Hosie Poisson, MD      .  apixaban (ELIQUIS) tablet 5 mg  5 mg Oral BID Schorr, Rhetta Mura, NP   5 mg at 12/25/16 1018  . azithromycin (ZITHROMAX) 500 mg in dextrose 5 % 250 mL IVPB  500 mg Intravenous Q24H Samella Parr, NP   Stopped at 12/24/16 1758  . divalproex (DEPAKOTE) DR tablet 750 mg  750 mg Oral Q12H Greta Doom, MD   750 mg at 12/25/16 1018  . LORazepam (ATIVAN) injection 1 mg  1 mg Intravenous Once Greta Doom, MD      . sodium chloride flush (NS) 0.9 % injection 3 mL  3 mL Intravenous Q12H Samella Parr, NP   3 mL at 12/25/16 1000     Discharge  Medications: Please see discharge summary for a list of discharge medications.  Relevant Imaging Results:  Relevant Lab Results:   Additional Information SS#: [id number]  Geralynn Ochs, LCSW

## 2016-12-25 NOTE — Care Management Note (Signed)
Case Management Note  Patient Details  Name: Steve Norris MRN: [id number] Date of Birth: 1930/04/13  Subjective/Objective:                    Action/Plan: Pt discharging back to [facility]. Pt with orders for rolling walker. Santiago Glad with [facility] DME notified and will deliver the equipment to the room.  Daughter providing transport to facility.   Expected Discharge Date:  12/25/16               Expected Discharge Plan:  Assisted Living / Rest Home  In-House Referral:     Discharge planning Services  CM Consult  Post Acute Care Choice:  Durable Medical Equipment Choice offered to:  Patient, Adult Children  DME Arranged:  Walker rolling DME Agency:  [place]:    [place]:     Status of Service:  Completed, signed off  If discussed at [place] of Stay Meetings, dates discussed:    Additional Comments:  Pollie Friar, RN 12/25/2016, 11:59 AM

## 2016-12-25 NOTE — Progress Notes (Signed)
Discharge to: [facility] Anticipated discharge date: 12/25/16 Family notified: Yes, at bedside Transportation by: Family car  Report #: [phone]  [place] signing off.  Laveda Abbe LCSW [phone]

## 2016-12-25 NOTE — Discharge Summary (Signed)
Physician Discharge Summary  Steve Norris NWG:[id number] DOB: [birth year] DOA: 12/23/2016  PCP: Deland Pretty, MD  Admit date: 12/23/2016 Discharge date: 12/25/2016  Admitted From: ALF.  Disposition:ALF  Recommendations for Outpatient Follow-up:  1. Follow up with PCP in 1-2 weeks 2. Please obtain BMP/CBC in one week Please follow up with neurology in 2 weeks.   Discharge Condition:STABLE.  CODE STATUS: FULL CODE.  Diet recommendation: Heart Healthy  Brief/Interim Summary: Steve Norris a 83 y.o.malewith medical history significant for hypertension, CAD, grade 2, diastolic heart failure, paroxysmal atrial fibrillation on eliquis, asthma, dementia, dyslipidemia, GERD and Barrett's esophagitis, comes in from ALF for altered mental status, new onset seizures, was started on depakon and his seizures are controlled.  Neurology consulted AND recommendations given.   Discharge Diagnoses:  Principal Problem:   New onset seizure ([place]) Active Problems:   Acute left otitis media   Acute encephalopathy   PAF (paroxysmal atrial fibrillation) (HCC)   Benign essential HTN   Chronic diastolic heart failure, NYHA class 2 (HCC)   Hyperlipidemia   Dementia   Asthma   Elevated AST (SGOT)  New Onset Seizures:  Witnessed seizure on admission EEG done , unremarkable.  Started on depacon and he is much better, no seizure activity last night or this am.  Unclear etiology,. Possibly from dementia. CT Angio of the head and neck reviewed.  Neurology consulted and recommendations given. outpatient follow up with neurology in 2 weeks.  discharged on oral depakote 750 mg bid.     Acute otitis media: On IV zithromax, transitioned to oral zithromax.   PAF:  Rate controlled. On sotalol for rate control. On eliquis to continue on discharge.    Hypertension; well controlled.    Chronic diastolic heart failure:  He appears Euvolemic.    Dementia:  No agitation, currently residing in  memory care unit.    Hyperlipidemia  On crestor.   Asthma:  Prn albuterol.   Acute encephalopathy:  Possibly post ictal from seizures.  Resolved, he is alert and oriented now.    Discharge Instructions  Discharge Instructions    Diet - low sodium heart healthy    Complete by:  As directed    Discharge instructions    Complete by:  As directed    Follow up with neurologist as outpatient.     Allergies as of 12/25/2016      Reactions   Amiodarone Nausea And Vomiting   Bactrim [sulfamethoxazole-trimethoprim] Other (See Comments)   Unknown reaction. Listed on MAR   Penicillins Hives, Swelling, Rash      Medication List    STOP taking these medications   nebivolol 5 MG tablet Commonly known as:  BYSTOLIC   warfarin 5 MG tablet Commonly known as:  COUMADIN     TAKE these medications   albuterol 0.63 MG/3ML nebulizer solution Commonly known as:  ACCUNEB Take 1 ampule by nebulization every 8 (eight) hours as needed for wheezing.   azithromycin 250 MG tablet Commonly known as:  ZITHROMAX Z-PAK Take 2 tablets (500 mg total) by mouth daily.   divalproex 250 MG DR tablet Commonly known as:  DEPAKOTE Take 3 tablets (750 mg total) by mouth every 12 (twelve) hours.   ELIQUIS 5 MG Tabs tablet Generic drug:  apixaban Take 5 mg by mouth 2 (two) times daily.   finasteride 5 MG tablet Commonly known as:  PROSCAR Take 5 mg by mouth daily.   furosemide 20 MG tablet Commonly known as:  LASIX Take  1 tablet (20 mg total) by mouth daily. What changed:  how much to take   multivitamin with minerals tablet Take 1 tablet by mouth daily.   nitroGLYCERIN 0.4 MG SL tablet Commonly known as:  NITROSTAT Place 0.4 mg under the tongue every 5 (five) minutes as needed for chest pain.   potassium chloride SA 20 MEQ tablet Commonly known as:  K-DUR,KLOR-CON take 1 tablet by mouth once daily What changed:  See the new instructions.   rosuvastatin 20 MG tablet Commonly  known as:  CRESTOR Take 20 mg by mouth daily.   SENNA-S 8.6-50 MG tablet Generic drug:  senna-docusate Take 1 tablet by mouth as needed for mild constipation.   sertraline 50 MG tablet Commonly known as:  ZOLOFT Take 100 mg by mouth daily.   sotalol 80 MG tablet Commonly known as:  BETAPACE take 1 tablet by mouth every 12 hours What changed:  See the new instructions.   tamsulosin 0.4 MG Caps capsule Commonly known as:  FLOMAX Take 1 capsule by mouth daily.   temazepam 30 MG capsule Commonly known as:  RESTORIL Take 30 mg by mouth at bedtime.            Discharge Care Instructions        Start     Ordered   12/25/16 0000  Diet - low sodium heart healthy     12/25/16 1010   12/25/16 0000  Discharge instructions    Comments:  Follow up with neurologist as outpatient.   12/25/16 1010   12/25/16 0000  divalproex (DEPAKOTE) 250 MG DR tablet  Every 12 hours     12/25/16 1015   12/25/16 0000  azithromycin (ZITHROMAX Z-PAK) 250 MG tablet  Daily     12/25/16 1015     Follow-up Information    Deland Pretty, MD. Schedule an appointment as soon as possible for a visit in 1 week(s).   Specialty:  Internal Medicine Contact information: [address] [place] [address] [phone]        Rosalin Hawking, MD. Schedule an appointment as soon as possible for a visit in 2 week(s).   Specialty:  Neurology Why:  for seizures  Contact information: [address] [place] [address] [phone]          Allergies  Allergen Reactions  . Amiodarone Nausea And Vomiting  . Bactrim [Sulfamethoxazole-Trimethoprim] Other (See Comments)    Unknown reaction. Listed on MAR  . Penicillins Hives, Swelling and Rash    Consultations:  Neurology.    Procedures/Studies: Ct Angio Head W Or Wo Contrast  Result Date: 12/23/2016 CLINICAL DATA:  83 year old male with altered mental status, tonic chronic seizure. EXAM: CT ANGIOGRAPHY HEAD AND  NECK CT PERFUSION BRAIN TECHNIQUE: Multidetector CT imaging of the head and neck was performed using the standard protocol during bolus administration of intravenous contrast. Multiplanar CT image reconstructions and MIPs were obtained to evaluate the vascular anatomy. Carotid stenosis measurements (when applicable) are obtained utilizing NASCET criteria, using the distal internal carotid diameter as the denominator. Multiphase CT imaging of the brain was performed following IV bolus contrast injection. Subsequent parametric perfusion maps were calculated using RAPID software. CONTRAST:  100 mL Isovue 370 COMPARISON:  Head CT without contrast 1349 hours today. FINDINGS: CT Brain Perfusion Findings: CBF (<30%) Volume: 0 mL Perfusion (Tmax>6.0s) volume: 4 mL (left anterior inferior frontal gyrus) Mismatch Volume: 4 mL Infarction Location:Not applicable CTA NECK Skeleton: No acute osseous abnormality identified. Upper  chest: Left chest left subclavian transvenous cardiac pacemaker type device. Centrilobular emphysema. Calcified aortic atherosclerosis. No superior mediastinal lymphadenopathy. Other neck: Negative.  No cervical lymphadenopathy. Aortic arch: Moderate calcified arch atherosclerosis. Three vessel arch configuration with calcified proximal great vessel plaque. Extensive paravertebral and bilateral shoulder contrast venous reflux which appears related to left subclavian vein stenosis, left upper extremity contrast injection. Right carotid system: No brachiocephalic or right CCA origin stenosis despite calcified plaque. Mild calcified plaque at the right carotid bifurcation, right ICA origin and bulb without stenosis. Moderate to severe tortuosity of the right ICA just below the skullbase. Left carotid system: No left CCA origin stenosis despite calcified plaque. Minimal plaque at the left carotid bifurcation. Mild to moderate calcified plaque at the distal left ICA bulb with 50% or less stenosis with respect  to the distal vessel. Moderate to severe tortuosity of the left ICA just below the skullbase. Vertebral arteries:No proximal right subclavian artery stenosis despite plaque. Occluded right vertebral artery origin (series 6, image 41). Faint thread-like enhancement of the distal right V 2 segment is visible, but the vessel then appears occluded at the skull base. No proximal left subclavian artery stenosis despite plaque. Soft and calcified plaque at the left vertebral artery origin with moderate stenosis (series 8, image 178). Dominant appearing left vertebral artery is patent to the skullbase without additional stenosis. CTA HEAD Posterior circulation: Dominant distal left vertebral artery supplies the basilar without stenosis. Patent left PICA origin. Probable retrograde enhancement of a diminutive and irregular right V4 segment to the level of the right PICA. Patent basilar artery without stenosis. Normal SCA and PCA origins. Posterior communicating arteries are diminutive or absent. Bilateral PCA branches are within normal limits. Anterior circulation: Patent ICA siphons with tortuosity and mild to moderate calcified plaque. Mild left supraclinoid and right cavernous ICA stenosis. Patent carotid termini, MCA and ACA origins. A1 segments, anterior communicating artery and bilateral ACA branches are within normal limits. Left MCA M1 segment, left MCA bifurcation, and left MCA branches are within normal limits. Right MCA M1 segment, trifurcation, and right MCA branches are within normal limits. Venous sinuses: Appear patent on delayed imaging. Anatomic variants: Dominant appearing left vertebral artery. Delayed phase: No abnormal enhancement identified. Stable gray-white matter differentiation throughout the brain. Review of the MIP images confirms the above findings IMPRESSION: 1. 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 There is a moderate degree of probably retrograde  reconstituted enhancement of a diminutive and atherosclerotic appearing right V4 segment. 2. CT perfusion does not detect core infarct, and no intracranial artery occlusion or significant significant arterial stenosis is identified. 3. Up to moderate stenosis at the dominant left vertebral artery origin. 4. No carotid stenosis in the neck despite calcified plaque. 5. Stable CT appearance of the brain since 1349 hours today. 6. Aortic Atherosclerosis (ICD10-I70.0) and Emphysema (ICD10-J43.9). 7. Salient findings were relayed via text pager to Dr. Chauncey Cruel. Aroor on 12/23/2016 at 20:31 . Electronically Signed   By: Genevie Ann M.D.   On: 12/23/2016 20:33   Dg Chest 2 View  Result Date: 12/23/2016 CLINICAL DATA:  Altered mental status EXAM: CHEST  2 VIEW COMPARISON:  09/18/2016 FINDINGS: Left pacer remains in place, unchanged. Heart is upper limits normal in size. Diffuse interstitial prominence within the lungs, stable since prior study and dating back to 2017, most likely chronic interstitial lung disease. No confluent opacities or effusions. IMPRESSION: Suspect chronic interstitial lung disease, stable. Borderline heart size. Electronically Signed   By:  Rolm Baptise M.D.   On: 12/23/2016 14:18   Ct Head Wo Contrast  Result Date: 12/23/2016 CLINICAL DATA:  83 year old male with altered level of consciousness. Increased confusion, repetitive questions, an while in transport to ED had tonic clonic seizure. EXAM: CT HEAD WITHOUT CONTRAST TECHNIQUE: Contiguous axial images were obtained from the base of the skull through the vertex without intravenous contrast. COMPARISON:  Head CT 05/06/2016. FINDINGS: Brain: Cerebral volume is stable and within normal limits for age. No midline shift, ventriculomegaly, mass effect, evidence of mass lesion, intracranial hemorrhage or evidence of cortically based acute infarction. Gray-white matter differentiation is within normal limits throughout the brain. Vascular: Calcified  atherosclerosis at the skull base. No suspicious intracranial vascular hyperdensity. Skull: Hyperostosis of the skull, normal variant. No acute osseous abnormality identified. Sinuses/Orbits: New opacification of the left middle ear and mastoids. No definite mastoid coalescence or osseous erosion. Other visible paranasal sinuses and mastoids are stable and well pneumatized. Other: No acute orbit or scalp soft tissue findings. Negative visible nasopharynx and oropharynx. IMPRESSION: 1. Opacification of the left middle ear and mastoids is new since January. Consider acute otitis media. 2. Noncontrast CT appearance of the brain remains normal for age. Electronically Signed   By: Genevie Ann M.D.   On: 12/23/2016 14:06   Ct Angio Neck W Or Wo Contrast  Result Date: 12/23/2016 CLINICAL DATA:  83 year old male with altered mental status, tonic chronic seizure. EXAM: CT ANGIOGRAPHY HEAD AND NECK CT PERFUSION BRAIN TECHNIQUE: Multidetector CT imaging of the head and neck was performed using the standard protocol during bolus administration of intravenous contrast. Multiplanar CT image reconstructions and MIPs were obtained to evaluate the vascular anatomy. Carotid stenosis measurements (when applicable) are obtained utilizing NASCET criteria, using the distal internal carotid diameter as the denominator. Multiphase CT imaging of the brain was performed following IV bolus contrast injection. Subsequent parametric perfusion maps were calculated using RAPID software. CONTRAST:  100 mL Isovue 370 COMPARISON:  Head CT without contrast 1349 hours today. FINDINGS: CT Brain Perfusion Findings: CBF (<30%) Volume: 0 mL Perfusion (Tmax>6.0s) volume: 4 mL (left anterior inferior frontal gyrus) Mismatch Volume: 4 mL Infarction Location:Not applicable CTA NECK Skeleton: No acute osseous abnormality identified. Upper chest: Left chest left subclavian transvenous cardiac pacemaker type device. Centrilobular emphysema. Calcified aortic  atherosclerosis. No superior mediastinal lymphadenopathy. Other neck: Negative.  No cervical lymphadenopathy. Aortic arch: Moderate calcified arch atherosclerosis. Three vessel arch configuration with calcified proximal great vessel plaque. Extensive paravertebral and bilateral shoulder contrast venous reflux which appears related to left subclavian vein stenosis, left upper extremity contrast injection. Right carotid system: No brachiocephalic or right CCA origin stenosis despite calcified plaque. Mild calcified plaque at the right carotid bifurcation, right ICA origin and bulb without stenosis. Moderate to severe tortuosity of the right ICA just below the skullbase. Left carotid system: No left CCA origin stenosis despite calcified plaque. Minimal plaque at the left carotid bifurcation. Mild to moderate calcified plaque at the distal left ICA bulb with 50% or less stenosis with respect to the distal vessel. Moderate to severe tortuosity of the left ICA just below the skullbase. Vertebral arteries:No proximal right subclavian artery stenosis despite plaque. Occluded right vertebral artery origin (series 6, image 41). Faint thread-like enhancement of the distal right V 2 segment is visible, but the vessel then appears occluded at the skull base. No proximal left subclavian artery stenosis despite plaque. Soft and calcified plaque at the left vertebral artery origin with moderate stenosis (series  8, image 178). Dominant appearing left vertebral artery is patent to the skullbase without additional stenosis. CTA HEAD Posterior circulation: Dominant distal left vertebral artery supplies the basilar without stenosis. Patent left PICA origin. Probable retrograde enhancement of a diminutive and irregular right V4 segment to the level of the right PICA. Patent basilar artery without stenosis. Normal SCA and PCA origins. Posterior communicating arteries are diminutive or absent. Bilateral PCA branches are within normal  limits. Anterior circulation: Patent ICA siphons with tortuosity and mild to moderate calcified plaque. Mild left supraclinoid and right cavernous ICA stenosis. Patent carotid termini, MCA and ACA origins. A1 segments, anterior communicating artery and bilateral ACA branches are within normal limits. Left MCA M1 segment, left MCA bifurcation, and left MCA branches are within normal limits. Right MCA M1 segment, trifurcation, and right MCA branches are within normal limits. Venous sinuses: Appear patent on delayed imaging. Anatomic variants: Dominant appearing left vertebral artery. Delayed phase: No abnormal enhancement identified. Stable gray-white matter differentiation throughout the brain. Review of the MIP images confirms the above findings IMPRESSION: 1. 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 There is a moderate degree of probably retrograde reconstituted enhancement of a diminutive and atherosclerotic appearing right V4 segment. 2. CT perfusion does not detect core infarct, and no intracranial artery occlusion or significant significant arterial stenosis is identified. 3. Up to moderate stenosis at the dominant left vertebral artery origin. 4. No carotid stenosis in the neck despite calcified plaque. 5. Stable CT appearance of the brain since 1349 hours today. 6. Aortic Atherosclerosis (ICD10-I70.0) and Emphysema (ICD10-J43.9). 7. Salient findings were relayed via text pager to Dr. Chauncey Cruel. Aroor on 12/23/2016 at 20:31 . Electronically Signed   By: Genevie Ann M.D.   On: 12/23/2016 20:33   Ct Cerebral Perfusion W Contrast  Result Date: 12/23/2016 CLINICAL DATA:  83 year old male with altered mental status, tonic chronic seizure. EXAM: CT ANGIOGRAPHY HEAD AND NECK CT PERFUSION BRAIN TECHNIQUE: Multidetector CT imaging of the head and neck was performed using the standard protocol during bolus administration of intravenous contrast. Multiplanar CT image reconstructions and MIPs were  obtained to evaluate the vascular anatomy. Carotid stenosis measurements (when applicable) are obtained utilizing NASCET criteria, using the distal internal carotid diameter as the denominator. Multiphase CT imaging of the brain was performed following IV bolus contrast injection. Subsequent parametric perfusion maps were calculated using RAPID software. CONTRAST:  100 mL Isovue 370 COMPARISON:  Head CT without contrast 1349 hours today. FINDINGS: CT Brain Perfusion Findings: CBF (<30%) Volume: 0 mL Perfusion (Tmax>6.0s) volume: 4 mL (left anterior inferior frontal gyrus) Mismatch Volume: 4 mL Infarction Location:Not applicable CTA NECK Skeleton: No acute osseous abnormality identified. Upper chest: Left chest left subclavian transvenous cardiac pacemaker type device. Centrilobular emphysema. Calcified aortic atherosclerosis. No superior mediastinal lymphadenopathy. Other neck: Negative.  No cervical lymphadenopathy. Aortic arch: Moderate calcified arch atherosclerosis. Three vessel arch configuration with calcified proximal great vessel plaque. Extensive paravertebral and bilateral shoulder contrast venous reflux which appears related to left subclavian vein stenosis, left upper extremity contrast injection. Right carotid system: No brachiocephalic or right CCA origin stenosis despite calcified plaque. Mild calcified plaque at the right carotid bifurcation, right ICA origin and bulb without stenosis. Moderate to severe tortuosity of the right ICA just below the skullbase. Left carotid system: No left CCA origin stenosis despite calcified plaque. Minimal plaque at the left carotid bifurcation. Mild to moderate calcified plaque at the distal left ICA bulb with 50% or less stenosis with  respect to the distal vessel. Moderate to severe tortuosity of the left ICA just below the skullbase. Vertebral arteries:No proximal right subclavian artery stenosis despite plaque. Occluded right vertebral artery origin (series 6,  image 41). Faint thread-like enhancement of the distal right V 2 segment is visible, but the vessel then appears occluded at the skull base. No proximal left subclavian artery stenosis despite plaque. Soft and calcified plaque at the left vertebral artery origin with moderate stenosis (series 8, image 178). Dominant appearing left vertebral artery is patent to the skullbase without additional stenosis. CTA HEAD Posterior circulation: Dominant distal left vertebral artery supplies the basilar without stenosis. Patent left PICA origin. Probable retrograde enhancement of a diminutive and irregular right V4 segment to the level of the right PICA. Patent basilar artery without stenosis. Normal SCA and PCA origins. Posterior communicating arteries are diminutive or absent. Bilateral PCA branches are within normal limits. Anterior circulation: Patent ICA siphons with tortuosity and mild to moderate calcified plaque. Mild left supraclinoid and right cavernous ICA stenosis. Patent carotid termini, MCA and ACA origins. A1 segments, anterior communicating artery and bilateral ACA branches are within normal limits. Left MCA M1 segment, left MCA bifurcation, and left MCA branches are within normal limits. Right MCA M1 segment, trifurcation, and right MCA branches are within normal limits. Venous sinuses: Appear patent on delayed imaging. Anatomic variants: Dominant appearing left vertebral artery. Delayed phase: No abnormal enhancement identified. Stable gray-white matter differentiation throughout the brain. Review of the MIP images confirms the above findings IMPRESSION: 1. 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 There is a moderate degree of probably retrograde reconstituted enhancement of a diminutive and atherosclerotic appearing right V4 segment. 2. CT perfusion does not detect core infarct, and no intracranial artery occlusion or significant significant arterial stenosis is identified. 3.  Up to moderate stenosis at the dominant left vertebral artery origin. 4. No carotid stenosis in the neck despite calcified plaque. 5. Stable CT appearance of the brain since 1349 hours today. 6. Aortic Atherosclerosis (ICD10-I70.0) and Emphysema (ICD10-J43.9). 7. Salient findings were relayed via text pager to Dr. Chauncey Cruel. Aroor on 12/23/2016 at 20:31 . Electronically Signed   By: Genevie Ann M.D.   On: 12/23/2016 20:33     Subjective: No new complaints.   Discharge Exam: Vitals:   12/25/16 0014 12/25/16 0544  BP: 126/62 (!) 117/56  Pulse: 60 64  Resp: 16 16  Temp: 98.2 F (36.8 C) 97.6 F (36.4 C)  SpO2: 97% 97%   Vitals:   12/24/16 2116 12/25/16 0014 12/25/16 0500 12/25/16 0544  BP: (!) 132/56 126/62  (!) 117/56  Pulse: (!) 59 60  64  Resp: 16 16  16   Temp: 97.8 F (36.6 C) 98.2 F (36.8 C)  97.6 F (36.4 C)  TempSrc: Oral Oral  Oral  SpO2: 97% 97%  97%  Weight:   76.7 kg (169 lb)   Height:        General: Pt is alert, awake, not in acute distress Cardiovascular: RRR, S1/S2 +, no rubs, no gallops Respiratory: CTA bilaterally, no wheezing, no rhonchi Abdominal: Soft, NT, ND, bowel sounds + Extremities: no edema, no cyanosis    The results of significant diagnostics from this hospitalization (including imaging, microbiology, ancillary and laboratory) are listed below for reference.     Microbiology: Recent Results (from the past 240 hour(s))  Culture, blood (Routine X 2) w Reflex to ID Panel     Status: None (Preliminary result)   Collection  Time: 12/23/16  6:02 PM  Result Value Ref Range Status   Specimen Description BLOOD LEFT HAND  Final   Special Requests IN PEDIATRIC BOTTLE Blood Culture adequate volume  Final   Culture NO GROWTH < 24 HOURS  Final   Report Status PENDING  Incomplete  Culture, blood (Routine X 2) w Reflex to ID Panel     Status: None (Preliminary result)   Collection Time: 12/23/16  6:02 PM  Result Value Ref Range Status   Specimen Description BLOOD  LEFT HAND  Final   Special Requests IN PEDIATRIC BOTTLE Blood Culture adequate volume  Final   Culture NO GROWTH < 24 HOURS  Final   Report Status PENDING  Incomplete     Labs: BNP (last 3 results)  Recent Labs  08/08/16 1935  BNP 981.1*   Basic Metabolic Panel:  Recent Labs Lab 12/23/16 1421 12/24/16 0808  NA 136 139  K 4.3 3.6  CL 100* 107  CO2 28 28  GLUCOSE 108* 103*  BUN 12 9  CREATININE 0.59* 0.58*  CALCIUM 8.7* 8.3*  MG 1.9  --   PHOS 3.6  --    Liver Function Tests:  Recent Labs Lab 12/23/16 1421 12/24/16 0808  AST 55* 47*  ALT 23 19  ALKPHOS 104 87  BILITOT 0.5 0.6  PROT 7.8 6.9  ALBUMIN 2.5* 2.2*   No results for input(s): LIPASE, AMYLASE in the last 168 hours. No results for input(s): AMMONIA in the last 168 hours. CBC:  Recent Labs Lab 12/23/16 1421 12/24/16 0808 12/25/16 0529  WBC 7.6 7.2 6.4  HGB 11.3* 9.7* 9.6*  HCT 34.7* 30.6* 29.7*  MCV 85.5 86.2 87.1  PLT 321 288 272   Cardiac Enzymes:  Recent Labs Lab 12/23/16 1421  CKTOTAL 21*  TROPONINI <0.03   BNP: Invalid input(s): POCBNP CBG: No results for input(s): GLUCAP in the last 168 hours. D-Dimer No results for input(s): DDIMER in the last 72 hours. Hgb A1c No results for input(s): HGBA1C in the last 72 hours. Lipid Profile No results for input(s): CHOL, HDL, LDLCALC, TRIG, CHOLHDL, LDLDIRECT in the last 72 hours. Thyroid function studies No results for input(s): TSH, T4TOTAL, T3FREE, THYROIDAB in the last 72 hours.  Invalid input(s): FREET3 Anemia work up No results for input(s): VITAMINB12, FOLATE, FERRITIN, TIBC, IRON, RETICCTPCT in the last 72 hours. Urinalysis    Component Value Date/Time   COLORURINE YELLOW 12/23/2016 1320   APPEARANCEUR CLEAR 12/23/2016 1320   LABSPEC 1.008 12/23/2016 1320   PHURINE 5.0 12/23/2016 1320   GLUCOSEU NEGATIVE 12/23/2016 1320   HGBUR SMALL (A) 12/23/2016 1320   BILIRUBINUR NEGATIVE 12/23/2016 1320   KETONESUR NEGATIVE  12/23/2016 1320   PROTEINUR NEGATIVE 12/23/2016 1320   NITRITE NEGATIVE 12/23/2016 1320   LEUKOCYTESUR NEGATIVE 12/23/2016 1320   Sepsis Labs Invalid input(s): PROCALCITONIN,  WBC,  LACTICIDVEN Microbiology Recent Results (from the past 240 hour(s))  Culture, blood (Routine X 2) w Reflex to ID Panel     Status: None (Preliminary result)   Collection Time: 12/23/16  6:02 PM  Result Value Ref Range Status   Specimen Description BLOOD LEFT HAND  Final   Special Requests IN PEDIATRIC BOTTLE Blood Culture adequate volume  Final   Culture NO GROWTH < 24 HOURS  Final   Report Status PENDING  Incomplete  Culture, blood (Routine X 2) w Reflex to ID Panel     Status: None (Preliminary result)   Collection Time: 12/23/16  6:02 PM  Result  Value Ref Range Status   Specimen Description BLOOD LEFT HAND  Final   Special Requests IN PEDIATRIC BOTTLE Blood Culture adequate volume  Final   Culture NO GROWTH < 24 HOURS  Final   Report Status PENDING  Incomplete     Time coordinating discharge: Over 30 minutes  SIGNED:   Hosie Poisson, MD  [facility] 12/25/2016, 10:16 AM Pager   If 7PM-7AM, please contact night-coverage [email or web address] Password [email or web address]

## 2016-12-25 NOTE — NC FL2 (Signed)
[place] LEVEL OF CARE SCREENING TOOL     IDENTIFICATION  Patient Name: Steve Norris Birthdate: [birth year] Sex: male Admission Date (Current Location): 12/23/2016  [facility] and [place] Number:  Herbalist and Address:  [facility], [place] [address], [place], [place] [address]      Provider Number: [id number]  Attending Physician Name and Address:  No att. providers found  Relative Name and Phone Number:       Current Level of Care: Hospital Recommended Level of Care: Memory Care Prior Approval Number:    Date Approved/Denied:   PASRR Number:    Discharge Plan: Other (Comment) (Memory Care)    Current Diagnoses: Patient Active Problem List   Diagnosis Date Noted  . New onset seizure ([place]) 12/23/2016  . Acute left otitis media 12/23/2016  . Acute encephalopathy 12/23/2016  . PAF (paroxysmal atrial fibrillation) ([place]) 12/23/2016  . Benign essential HTN 12/23/2016  . Chronic diastolic heart failure, NYHA class 2 ([place]) 12/23/2016  . Hyperlipidemia 12/23/2016  . Dementia 12/23/2016  . Asthma 12/23/2016  . Elevated AST (SGOT) 12/23/2016  . Acute diastolic CHF (congestive heart failure) ([place])   . Pressure injury of skin 08/09/2016  . BPH (benign prostatic hyperplasia) 08/09/2016  . HTN (hypertension) 08/09/2016  . CHF exacerbation ([place]) 08/08/2016  . Anemia 03/25/2016  . SSS (sick sinus syndrome) ([place]) 03/25/2016  . CAP (community acquired pneumonia)   . Atrial fibrillation with rapid ventricular response ([place]) 11/04/2014  . Dyspnea 12/15/2012  . Long term current use of anticoagulant therapy 07/14/2012  . COPD (chronic obstructive pulmonary disease) ([place]) 02/23/2012  . CAD (coronary artery disease) 02/23/2012  . S/P placement of cardiac pacemaker 02/23/2012  . Atrial fibrillation ([place]) 02/23/2012  . BARRETT'S ESOPHAGUS 04/30/2009    Orientation RESPIRATION BLADDER Height & Weight     Self, Place  Normal Continent  Weight: 169 lb (76.7 kg) Height:  5\' 10"  (177.8 cm)  BEHAVIORAL SYMPTOMS/MOOD NEUROLOGICAL BOWEL NUTRITION STATUS    Convulsions/Seizures Continent    AMBULATORY STATUS COMMUNICATION OF NEEDS Skin   Supervision Verbally Normal                       Personal Care Assistance Level of Assistance  Bathing, Dressing Bathing Assistance: Limited assistance   Dressing Assistance: Limited assistance     Functional Limitations Info             SPECIAL CARE FACTORS FREQUENCY                       Contractures      Additional Factors Info  Code Status, Allergies Code Status Info: Full Allergies Info: Amiodarone, Bactrim Sulfamethoxazole-trimethoprim, Penicillins           Current Medications (12/25/2016):  This is the current hospital active medication list Current Facility-Administered Medications  Medication Dose Route Frequency Provider Last Rate Last Dose  . 0.9 %  sodium chloride infusion  100 mL/hr Intravenous Continuous Carmin Muskrat, MD 100 mL/hr at 12/24/16 0554 100 mL/hr at 12/24/16 0554  . acetaminophen (TYLENOL) tablet 650 mg  650 mg Oral Q6H PRN Samella Parr, NP       Or  . acetaminophen (TYLENOL) suppository 650 mg  650 mg Rectal Q6H PRN Erin Hearing L, NP      . albuterol (PROVENTIL) (2.5 MG/3ML) 0.083% nebulizer solution 2.5 mg  2.5 mg Nebulization Q2H PRN Hosie Poisson, MD      .  apixaban (ELIQUIS) tablet 5 mg  5 mg Oral BID Schorr, Rhetta Mura, NP   5 mg at 12/25/16 1018  . azithromycin (ZITHROMAX) 500 mg in dextrose 5 % 250 mL IVPB  500 mg Intravenous Q24H Samella Parr, NP   Stopped at 12/24/16 1758  . divalproex (DEPAKOTE) DR tablet 750 mg  750 mg Oral Q12H Greta Doom, MD   750 mg at 12/25/16 1018  . LORazepam (ATIVAN) injection 1 mg  1 mg Intravenous Once Greta Doom, MD      . sodium chloride flush (NS) 0.9 % injection 3 mL  3 mL Intravenous Q12H Samella Parr, NP   3 mL at 12/25/16 1000   Current Outpatient  Prescriptions  Medication Sig Dispense Refill  . albuterol (ACCUNEB) 0.63 MG/3ML nebulizer solution Take 1 ampule by nebulization every 8 (eight) hours as needed for wheezing.    Marland Kitchen apixaban (ELIQUIS) 5 MG TABS tablet Take 5 mg by mouth 2 (two) times daily.    . finasteride (PROSCAR) 5 MG tablet Take 5 mg by mouth daily.     . furosemide (LASIX) 20 MG tablet Take 1 tablet (20 mg total) by mouth daily. (Patient taking differently: Take 40 mg by mouth daily. ) 30 tablet 6  . Multiple Vitamins-Minerals (MULTIVITAMIN WITH MINERALS) tablet Take 1 tablet by mouth daily.    . nitroGLYCERIN (NITROSTAT) 0.4 MG SL tablet Place 0.4 mg under the tongue every 5 (five) minutes as needed for chest pain.     . potassium chloride SA (K-DUR,KLOR-CON) 20 MEQ tablet take 1 tablet by mouth once daily (Patient taking differently: take 61mEq by mouth once daily) 30 tablet 6  . rosuvastatin (CRESTOR) 20 MG tablet Take 20 mg by mouth daily.     Marland Kitchen senna-docusate (SENNA-S) 8.6-50 MG per tablet Take 1 tablet by mouth as needed for mild constipation.     . sertraline (ZOLOFT) 50 MG tablet Take 100 mg by mouth daily.     . sotalol (BETAPACE) 80 MG tablet take 1 tablet by mouth every 12 hours (Patient taking differently: take 1 tablet by mouth twice daily) 60 tablet 5  . tamsulosin (FLOMAX) 0.4 MG CAPS capsule Take 1 capsule by mouth daily.    . temazepam (RESTORIL) 30 MG capsule Take 30 mg by mouth at bedtime.    Marland Kitchen azithromycin (ZITHROMAX Z-PAK) 250 MG tablet Take 2 tablets (500 mg total) by mouth daily. 6 each 0  . divalproex (DEPAKOTE) 250 MG DR tablet Take 3 tablets (750 mg total) by mouth every 12 (twelve) hours. 60 tablet 0     Discharge Medications: Please see discharge summary for a list of discharge medications.  Relevant Imaging Results:  Relevant Lab Results:   Additional Information SS#: [id number]  Geralynn Ochs, LCSW

## 2016-12-25 NOTE — Progress Notes (Signed)
Pt discharging back to [facility] with daughter in law taking all personal belongings. Discharge instructions provided with prescriptions with verbal understanding. IV's removed applied dry dressing. Report called in to nurse, Tillie Rung at [facility]. No noted distress.

## 2016-12-25 NOTE — Clinical Social Work Note (Signed)
Clinical Social Work Assessment  Patient Details  Name: Steve Norris MRN: [id number] Date of Birth: 1930-03-24  Date of referral:  12/25/16               Reason for consult:  Facility Placement, Discharge Planning                Permission sought to share information with:  Facility Sport and exercise psychologist, Family Supports Permission granted to share information::  Yes, Verbal Permission Granted  Name::     Deb  Agency::  [facility]  Relationship::  Daughter-in-law  Contact Information:     Housing/Transportation Living arrangements for the past 2 months:  [place] of Information:  Patient, Adult Children Patient Interpreter Needed:  None Criminal Activity/Legal Involvement Pertinent to Current Situation/Hospitalization:  No - Comment as needed Significant Relationships:  Adult Children Lives with:  Self, Facility Resident Do you feel safe going back to the place where you live?  Yes Need for family participation in patient care:  Yes (Comment) (patient not fully oriented)  Care giving concerns:  Patient has been living at [facility] and has no concerns about care received.   Social Worker assessment / plan:  CSW met with patient and patient's daughter-in-law at bedside to discuss discharge planning. CSW confirmed patient is from [facility] and will return. CSW explained role to daughter-in-law. CSW contacted facility and sent discharge information. CSW to follow to facilitate discharge back to ALF.  Employment status:  Retired Nurse, adult PT Recommendations:  No Follow Up Information / Referral to community resources:     Patient/Family's Response to care:  Patient agreeable to return to ALF.  Patient/Family's Understanding of and Emotional Response to Diagnosis, Current Treatment, and Prognosis:  Patient and family indicated understanding of CSW role and appreciated assistance in getting patient back home  today.  Emotional Assessment Appearance:  Appears stated age Attitude/Demeanor/Rapport:    Affect (typically observed):  Appropriate Orientation:  Oriented to Self, Oriented to Place Alcohol / Substance use:  Not Applicable Psych involvement (Current and /or in the community):  No (Comment)  Discharge Needs  Concerns to be addressed:  Care Coordination Readmission within the last 30 days:  No Current discharge risk:  None Barriers to Discharge:  No Barriers Identified   Geralynn Ochs, LCSW 12/25/2016, 10:39 AM

## 2016-12-26 ENCOUNTER — Emergency Department (HOSPITAL_COMMUNITY): Payer: PPO

## 2016-12-26 ENCOUNTER — Emergency Department (HOSPITAL_COMMUNITY)
Admission: EM | Admit: 2016-12-26 | Discharge: 2016-12-27 | Disposition: A | Discharge status: Home / Self Care | Payer: PPO | Attending: Emergency Medicine | Admitting: Emergency Medicine

## 2016-12-26 ENCOUNTER — Encounter (HOSPITAL_COMMUNITY): Payer: Self-pay

## 2016-12-26 DIAGNOSIS — W19XXXA Unspecified fall, initial encounter: Secondary | ICD-10-CM

## 2016-12-26 DIAGNOSIS — S61209A Unspecified open wound of unspecified finger without damage to nail, initial encounter: Secondary | ICD-10-CM | POA: Diagnosis not present

## 2016-12-26 DIAGNOSIS — Z87891 Personal history of nicotine dependence: Secondary | ICD-10-CM | POA: Diagnosis not present

## 2016-12-26 DIAGNOSIS — I5032 Chronic diastolic (congestive) heart failure: Secondary | ICD-10-CM | POA: Insufficient documentation

## 2016-12-26 DIAGNOSIS — F0390 Unspecified dementia without behavioral disturbance: Secondary | ICD-10-CM | POA: Diagnosis not present

## 2016-12-26 DIAGNOSIS — Y92129 Unspecified place in nursing home as the place of occurrence of the external cause: Secondary | ICD-10-CM | POA: Diagnosis not present

## 2016-12-26 DIAGNOSIS — Z950 Presence of cardiac pacemaker: Secondary | ICD-10-CM | POA: Diagnosis not present

## 2016-12-26 DIAGNOSIS — I2510 Atherosclerotic heart disease of native coronary artery without angina pectoris: Secondary | ICD-10-CM | POA: Diagnosis not present

## 2016-12-26 DIAGNOSIS — S61213A Laceration without foreign body of left middle finger without damage to nail, initial encounter: Secondary | ICD-10-CM | POA: Diagnosis not present

## 2016-12-26 DIAGNOSIS — T148XXA Other injury of unspecified body region, initial encounter: Secondary | ICD-10-CM | POA: Diagnosis not present

## 2016-12-26 DIAGNOSIS — Z79899 Other long term (current) drug therapy: Secondary | ICD-10-CM | POA: Insufficient documentation

## 2016-12-26 DIAGNOSIS — S0990XA Unspecified injury of head, initial encounter: Secondary | ICD-10-CM | POA: Diagnosis not present

## 2016-12-26 DIAGNOSIS — S0993XA Unspecified injury of face, initial encounter: Secondary | ICD-10-CM | POA: Diagnosis not present

## 2016-12-26 DIAGNOSIS — R9431 Abnormal electrocardiogram [ECG] [EKG]: Secondary | ICD-10-CM | POA: Diagnosis not present

## 2016-12-26 DIAGNOSIS — J45909 Unspecified asthma, uncomplicated: Secondary | ICD-10-CM | POA: Insufficient documentation

## 2016-12-26 DIAGNOSIS — J449 Chronic obstructive pulmonary disease, unspecified: Secondary | ICD-10-CM | POA: Insufficient documentation

## 2016-12-26 DIAGNOSIS — S199XXA Unspecified injury of neck, initial encounter: Secondary | ICD-10-CM | POA: Diagnosis not present

## 2016-12-26 DIAGNOSIS — I110 Hypertensive heart disease with heart failure: Secondary | ICD-10-CM | POA: Diagnosis not present

## 2016-12-26 DIAGNOSIS — W010XXA Fall on same level from slipping, tripping and stumbling without subsequent striking against object, initial encounter: Secondary | ICD-10-CM | POA: Diagnosis not present

## 2016-12-26 DIAGNOSIS — S0083XA Contusion of other part of head, initial encounter: Secondary | ICD-10-CM | POA: Diagnosis not present

## 2016-12-26 DIAGNOSIS — Y9389 Activity, other specified: Secondary | ICD-10-CM | POA: Insufficient documentation

## 2016-12-26 DIAGNOSIS — Y998 Other external cause status: Secondary | ICD-10-CM | POA: Diagnosis not present

## 2016-12-26 DIAGNOSIS — S61215A Laceration without foreign body of left ring finger without damage to nail, initial encounter: Secondary | ICD-10-CM | POA: Diagnosis not present

## 2016-12-26 DIAGNOSIS — Z7901 Long term (current) use of anticoagulants: Secondary | ICD-10-CM | POA: Insufficient documentation

## 2016-12-26 DIAGNOSIS — S61219A Laceration without foreign body of unspecified finger without damage to nail, initial encounter: Secondary | ICD-10-CM

## 2016-12-26 DIAGNOSIS — S61412A Laceration without foreign body of left hand, initial encounter: Secondary | ICD-10-CM | POA: Diagnosis not present

## 2016-12-26 LAB — PROTIME-INR
INR: 1.31
PROTHROMBIN TIME: 16.2 s — AB (ref 11.4–15.2)

## 2016-12-26 LAB — CBC WITH DIFFERENTIAL/PLATELET
Basophils Absolute: 0.1 10*3/uL (ref 0.0–0.1)
Basophils Relative: 1 %
EOS ABS: 0.5 10*3/uL (ref 0.0–0.7)
EOS PCT: 8 %
HCT: 31.4 % — ABNORMAL LOW (ref 39.0–52.0)
Hemoglobin: 10.1 g/dL — ABNORMAL LOW (ref 13.0–17.0)
LYMPHS ABS: 2.5 10*3/uL (ref 0.7–4.0)
LYMPHS PCT: 41 %
MCH: 27.6 pg (ref 26.0–34.0)
MCHC: 32.2 g/dL (ref 30.0–36.0)
MCV: 85.8 fL (ref 78.0–100.0)
Monocytes Absolute: 1 10*3/uL (ref 0.1–1.0)
Monocytes Relative: 17 %
Neutro Abs: 2 10*3/uL (ref 1.7–7.7)
Neutrophils Relative %: 33 %
PLATELETS: 264 10*3/uL (ref 150–400)
RBC: 3.66 MIL/uL — ABNORMAL LOW (ref 4.22–5.81)
RDW: 18.9 % — AB (ref 11.5–15.5)
WBC: 6 10*3/uL (ref 4.0–10.5)

## 2016-12-26 NOTE — ED Notes (Signed)
ED Provider at bedside. 

## 2016-12-26 NOTE — ED Triage Notes (Signed)
BIB [facility] from [facility] memory care room 414. Pt had unwitnessed fall . Unsure if LOC. Pt has a laceration on the top of the head and two fingers on the left hand. Pt not complaining of any pain at this time. Pt is usually able to tell if he is hurting. He is axox2 which is baseline. Ems placed C-colar for precautions of head laceration.

## 2016-12-26 NOTE — ED Notes (Signed)
Patient transported to X-ray 

## 2016-12-26 NOTE — ED Provider Notes (Signed)
[facility] DEPT Provider Note   CSN: [id number] Arrival date & time: 12/26/16  2210     History   Chief Complaint Chief Complaint  Patient presents with  . Fall    HPI Steve Norris is a 83 y.o. male.  HPI   82 year old male brought here via EMS from [facility] for evaluation of unwitnessed fall.  Patient has medical history is significant for paroxysmal atrial fibrillation currently on Eliquis, dementia, heart failure, and recent onset of new seizure that happened 4 days ago. He was admitted to the hospital and was discharged yesterday for his new onset seizure. He was placed on Depakote for seizure. Per nursing note, patient had an unwitnessed fall unsure of loss of conscious. He was found to have a laceration to the top of his head and to fingers left hand. He denies having any active pain. History is limited due to patient's baseline dementia. Pt has a hard cervical collar on.   Past Medical History:  Diagnosis Date  . Arthritis   . Asthma   . Barrett's esophagus 03-19-2009   EGD  . CAD (coronary artery disease)    a. s/p stent placement in 2006 to the LAD and RCA  . Cataract    REMOVED  . COPD (chronic obstructive pulmonary disease) ([place])   . Diverticulosis 03-19-2009   Colonoscopy   . Erosive esophagitis 03-19-2009   EGD  . GERD (gastroesophageal reflux disease)   . Hiatal hernia 03-19-2009   EGD  . Hyperlipidemia   . Hypertension   . Internal hemorrhoids 03-19-2009   Colonoscopy  . Paroxysmal atrial fibrillation (HCC)   . Sinus node dysfunction (HCC)    a. s/p PPM placement in 2011    Patient Active Problem List   Diagnosis Date Noted  . New onset seizure ([place]) 12/23/2016  . Acute left otitis media 12/23/2016  . Acute encephalopathy 12/23/2016  . PAF (paroxysmal atrial fibrillation) ([place]) 12/23/2016  . Benign essential HTN 12/23/2016  . Chronic diastolic heart failure, NYHA class 2 ([place]) 12/23/2016  . Hyperlipidemia 12/23/2016  .  Dementia 12/23/2016  . Asthma 12/23/2016  . Elevated AST (SGOT) 12/23/2016  . Acute diastolic CHF (congestive heart failure) ([place])   . Pressure injury of skin 08/09/2016  . BPH (benign prostatic hyperplasia) 08/09/2016  . HTN (hypertension) 08/09/2016  . CHF exacerbation ([place]) 08/08/2016  . Anemia 03/25/2016  . SSS (sick sinus syndrome) ([place]) 03/25/2016  . CAP (community acquired pneumonia)   . Atrial fibrillation with rapid ventricular response ([place]) 11/04/2014  . Dyspnea 12/15/2012  . Long term current use of anticoagulant therapy 07/14/2012  . COPD (chronic obstructive pulmonary disease) ([place]) 02/23/2012  . CAD (coronary artery disease) 02/23/2012  . S/P placement of cardiac pacemaker 02/23/2012  . Atrial fibrillation ([place]) 02/23/2012  . BARRETT'S ESOPHAGUS 04/30/2009    Past Surgical History:  Procedure Laterality Date  . CARDIAC CATHETERIZATION  02/12/2009   mild ostial left main disease,patent LAD & RCA stents   . CARDIOVERSION N/A 11/06/2014   Procedure: CARDIOVERSION;  Surgeon: [name], MD;  Location: [facility] ENDOSCOPY;  Service: Cardiovascular;  Laterality: N/A;  . CARDIOVERSION N/A 08/02/2015   Procedure: CARDIOVERSION;  Surgeon: Sueanne Margarita, MD;  Location: [facility] ENDOSCOPY;  Service: Cardiovascular;  Laterality: N/A;  . CATARACT EXTRACTION    . COLONOSCOPY    . CORONARY STENT PLACEMENT  2006   Right Coronary Artery Cypher stents placed 2006  . HEMORRHOID SURGERY    . LEFT HEART CATHETERIZATION  WITH CORONARY ANGIOGRAM N/A 07/01/2013   Procedure: LEFT HEART CATHETERIZATION WITH CORONARY ANGIOGRAM;  Surgeon: Sanda Klein, MD;  Location: [facility] CATH LAB;  Service: Cardiovascular;  Laterality: N/A;  . NM MYOCAR PERF WALL MOTION  08/14/2008   no significant ischemia EF 64%  . PACEMAKER INSERTION  07/25/2009   [employer] Accent  . TEE WITHOUT CARDIOVERSION N/A 08/02/2015   Procedure: TRANSESOPHAGEAL ECHOCARDIOGRAM (TEE);  Surgeon: Sueanne Margarita, MD;  Location: [facility] ENDOSCOPY;   Service: Cardiovascular;  Laterality: N/A;  . TONSILLECTOMY    . US ECHOCARDIOGRAPHY  07/12/2009   borderline LA enlargement,mild mitral annular ca+, AOV mildly sclerotic,trace AI.  Marland Kitchen VASECTOMY         Home Medications    Prior to Admission medications   Medication Sig Start Date End Date Taking? Authorizing Provider  albuterol (ACCUNEB) 0.63 MG/3ML nebulizer solution Take 1 ampule by nebulization every 8 (eight) hours as needed for wheezing.    [provider]  apixaban (ELIQUIS) 5 MG TABS tablet Take 5 mg by mouth 2 (two) times daily.    [provider]  azithromycin (ZITHROMAX Z-PAK) 250 MG tablet Take 2 tablets (500 mg total) by mouth daily. 12/25/16 12/28/16  Hosie Poisson, MD  divalproex (DEPAKOTE) 250 MG DR tablet Take 3 tablets (750 mg total) by mouth every 12 (twelve) hours. 12/25/16   Hosie Poisson, MD  finasteride (PROSCAR) 5 MG tablet Take 5 mg by mouth daily.  02/17/12   [provider]  furosemide (LASIX) 20 MG tablet Take 1 tablet (20 mg total) by mouth daily. Patient taking differently: Take 40 mg by mouth daily.  01/10/16   Croitoru, Mihai, MD  Multiple Vitamins-Minerals (MULTIVITAMIN WITH MINERALS) tablet Take 1 tablet by mouth daily.    [provider]  nitroGLYCERIN (NITROSTAT) 0.4 MG SL tablet Place 0.4 mg under the tongue every 5 (five) minutes as needed for chest pain.     [provider]  potassium chloride SA (K-DUR,KLOR-CON) 20 MEQ tablet take 1 tablet by mouth once daily Patient taking differently: take 39mEq by mouth once daily 12/18/15   Croitoru, Mihai, MD  rosuvastatin (CRESTOR) 20 MG tablet Take 20 mg by mouth daily.  06/20/16   [provider]  senna-docusate (SENNA-S) 8.6-50 MG per tablet Take 1 tablet by mouth as needed for mild constipation.     [provider]  sertraline (ZOLOFT) 50 MG tablet Take 100 mg by mouth daily.  03/07/16   [provider]  sotalol (BETAPACE) 80 MG tablet take 1  tablet by mouth every 12 hours Patient taking differently: take 1 tablet by mouth twice daily 02/11/16   Croitoru, Mihai, MD  tamsulosin (FLOMAX) 0.4 MG CAPS capsule Take 1 capsule by mouth daily. 12/27/15   [provider]  temazepam (RESTORIL) 30 MG capsule Take 30 mg by mouth at bedtime. 05/24/13   [provider]    Family History Family History  Problem Relation Age of Onset  . Heart disease Mother   . Heart disease Father   . Colon cancer Neg Hx     Social History Social History  Substance Use Topics  . Smoking status: Former Research scientist (life sciences)  . Smokeless tobacco: Never Used     Comment: Quit smoking 1996  . Alcohol use No     Allergies   Amiodarone; Bactrim [sulfamethoxazole-trimethoprim]; and Penicillins   Review of Systems Review of Systems  Unable to perform ROS: Dementia     Physical Exam Updated Vital Signs BP 132/65  Pulse 60   Resp (!) 26   SpO2 94%   Physical Exam  Constitutional: He appears well-developed and well-nourished. No distress.  Elderly male laying in bed sleeping easily arousable  HENT:  Head: Atraumatic.  Skin tear of the size of a quarter size noted to the vertex of scalp. Moderate size hematoma noted to left lateral eyebrow with small mild dry blood noted. No hemotympanum, no septal hematoma, no malocclusion and no midface tenderness  Eyes: Conjunctivae are normal.  Neck: Neck supple.  No significant cervical spine tenderness crepitus or step-off. Hard collar is in place  Cardiovascular: Normal rate and regular rhythm.   Pulmonary/Chest: Effort normal and breath sounds normal.  Abdominal: Soft. There is no tenderness.  Neurological: He is alert.  Skin: No rash noted.  Left hand: Crescent shape laceration noted to the dorsum of third PIP. Small abrasion and skin tear noted to the fourth PIP no obvious deformity.  Psychiatric: He has a normal mood and affect.  Nursing note and vitals reviewed.    ED Treatments / Results    Labs (all labs ordered are listed, but only abnormal results are displayed) Labs Reviewed  CBC WITH DIFFERENTIAL/PLATELET - Abnormal; Notable for the following:       Result Value   RBC 3.66 (*)    Hemoglobin 10.1 (*)    HCT 31.4 (*)    RDW 18.9 (*)    All other components within normal limits  BASIC METABOLIC PANEL - Abnormal; Notable for the following:    Creatinine, Ser 0.60 (*)    Calcium 8.5 (*)    All other components within normal limits  PROTIME-INR - Abnormal; Notable for the following:    Prothrombin Time 16.2 (*)    All other components within normal limits  VALPROIC ACID LEVEL    EKG  EKG Interpretation None     ED ECG REPORT   Date: 12/27/2016  Rate: 64  Rhythm: atrial paced rhythm  QRS Axis: normal  Intervals: normal  ST/T Wave abnormalities: nonspecific ST changes  Conduction Disutrbances:none  Narrative Interpretation:   Old EKG Reviewed: unchanged  I have personally reviewed the EKG tracing and agree with the computerized printout as noted.   Radiology Ct Head Wo Contrast  Result Date: 12/27/2016 CLINICAL DATA:  Unwitnessed fall with laceration to the top of head EXAM: CT HEAD WITHOUT CONTRAST CT MAXILLOFACIAL WITHOUT CONTRAST CT CERVICAL SPINE WITHOUT CONTRAST TECHNIQUE: Multidetector CT imaging of the head, cervical spine, and maxillofacial structures were performed using the standard protocol without intravenous contrast. Multiplanar CT image reconstructions of the cervical spine and maxillofacial structures were also generated. COMPARISON:  12/23/2016 FINDINGS: CT HEAD FINDINGS Brain: No acute territorial infarction, hemorrhage, or intracranial mass is seen. Atrophy. Stable ventricle size. Vascular: No hyperdense vessels. Scattered calcifications at the carotid siphons. Skull: No skull fracture. Left mastoid effusion. Fluid in the left middle air. Other: Left periorbital and forehead hematoma CT MAXILLOFACIAL FINDINGS Osseous: Mandibular heads are  normally position. No mandibular fracture. Pterygoid plates and zygomatic arches appear intact. No nasal bone fracture Orbits: Negative. No traumatic or inflammatory finding. Sinuses: Mild mucosal thickening in the right frontal and ethmoid sinuses. No acute fluid level. No sinus wall fracture Soft tissues: Large left periorbital and forehead soft tissue hematoma CT CERVICAL SPINE FINDINGS Alignment: Straightening of the cervical spine. No subluxation is seen. Facet alignment is maintained. Skull base and vertebrae: No acute fracture. No primary bone lesion or focal pathologic process. Soft tissues and spinal canal:  No prevertebral fluid or swelling. No visible canal hematoma. Disc levels: Moderate degenerative changes at C5-C6 and mild to moderate changes at C6-C7. Multiple level bilateral foraminal stenosis, secondary to spurring and facet hypertrophy. Upper chest: Biapical emphysema. Negative for pneumothorax. Multiple small neck lymph nodes. Other: None IMPRESSION: 1. No CT evidence for acute intracranial abnormality. Stable left mastoid and middle ear fluid. 2. Large left periorbital and forehead hematoma. No acute facial bone fracture. 3. Straightening of the cervical spine with degenerative changes. No acute osseous abnormality 4. Biapical emphysema Electronically Signed   By: Donavan Foil M.D.   On: 12/27/2016 00:26   Ct Cervical Spine Wo Contrast  Result Date: 12/27/2016 CLINICAL DATA:  Unwitnessed fall with laceration to the top of head EXAM: CT HEAD WITHOUT CONTRAST CT MAXILLOFACIAL WITHOUT CONTRAST CT CERVICAL SPINE WITHOUT CONTRAST TECHNIQUE: Multidetector CT imaging of the head, cervical spine, and maxillofacial structures were performed using the standard protocol without intravenous contrast. Multiplanar CT image reconstructions of the cervical spine and maxillofacial structures were also generated. COMPARISON:  12/23/2016 FINDINGS: CT HEAD FINDINGS Brain: No acute territorial infarction,  hemorrhage, or intracranial mass is seen. Atrophy. Stable ventricle size. Vascular: No hyperdense vessels. Scattered calcifications at the carotid siphons. Skull: No skull fracture. Left mastoid effusion. Fluid in the left middle air. Other: Left periorbital and forehead hematoma CT MAXILLOFACIAL FINDINGS Osseous: Mandibular heads are normally position. No mandibular fracture. Pterygoid plates and zygomatic arches appear intact. No nasal bone fracture Orbits: Negative. No traumatic or inflammatory finding. Sinuses: Mild mucosal thickening in the right frontal and ethmoid sinuses. No acute fluid level. No sinus wall fracture Soft tissues: Large left periorbital and forehead soft tissue hematoma CT CERVICAL SPINE FINDINGS Alignment: Straightening of the cervical spine. No subluxation is seen. Facet alignment is maintained. Skull base and vertebrae: No acute fracture. No primary bone lesion or focal pathologic process. Soft tissues and spinal canal: No prevertebral fluid or swelling. No visible canal hematoma. Disc levels: Moderate degenerative changes at C5-C6 and mild to moderate changes at C6-C7. Multiple level bilateral foraminal stenosis, secondary to spurring and facet hypertrophy. Upper chest: Biapical emphysema. Negative for pneumothorax. Multiple small neck lymph nodes. Other: None IMPRESSION: 1. No CT evidence for acute intracranial abnormality. Stable left mastoid and middle ear fluid. 2. Large left periorbital and forehead hematoma. No acute facial bone fracture. 3. Straightening of the cervical spine with degenerative changes. No acute osseous abnormality 4. Biapical emphysema Electronically Signed   By: Donavan Foil M.D.   On: 12/27/2016 00:26   Dg Hand Complete Left  Result Date: 12/26/2016 CLINICAL DATA:  Laceration and pain.  Unwitnessed fall. EXAM: LEFT HAND - COMPLETE 3+ VIEW COMPARISON:  None. FINDINGS: Negative for acute fracture or dislocation. Chronic fragments at the radial styloid and  ulnar styloid. No bone lesion or bony destruction. IMPRESSION: Negative for acute fracture, dislocation or radiopaque foreign body. Electronically Signed   By: Andreas Newport M.D.   On: 12/26/2016 23:47   Ct Maxillofacial Wo Contrast  Result Date: 12/27/2016 CLINICAL DATA:  Unwitnessed fall with laceration to the top of head EXAM: CT HEAD WITHOUT CONTRAST CT MAXILLOFACIAL WITHOUT CONTRAST CT CERVICAL SPINE WITHOUT CONTRAST TECHNIQUE: Multidetector CT imaging of the head, cervical spine, and maxillofacial structures were performed using the standard protocol without intravenous contrast. Multiplanar CT image reconstructions of the cervical spine and maxillofacial structures were also generated. COMPARISON:  12/23/2016 FINDINGS: CT HEAD FINDINGS Brain: No acute territorial infarction, hemorrhage, or intracranial mass is seen. Atrophy. Stable  ventricle size. Vascular: No hyperdense vessels. Scattered calcifications at the carotid siphons. Skull: No skull fracture. Left mastoid effusion. Fluid in the left middle air. Other: Left periorbital and forehead hematoma CT MAXILLOFACIAL FINDINGS Osseous: Mandibular heads are normally position. No mandibular fracture. Pterygoid plates and zygomatic arches appear intact. No nasal bone fracture Orbits: Negative. No traumatic or inflammatory finding. Sinuses: Mild mucosal thickening in the right frontal and ethmoid sinuses. No acute fluid level. No sinus wall fracture Soft tissues: Large left periorbital and forehead soft tissue hematoma CT CERVICAL SPINE FINDINGS Alignment: Straightening of the cervical spine. No subluxation is seen. Facet alignment is maintained. Skull base and vertebrae: No acute fracture. No primary bone lesion or focal pathologic process. Soft tissues and spinal canal: No prevertebral fluid or swelling. No visible canal hematoma. Disc levels: Moderate degenerative changes at C5-C6 and mild to moderate changes at C6-C7. Multiple level bilateral foraminal  stenosis, secondary to spurring and facet hypertrophy. Upper chest: Biapical emphysema. Negative for pneumothorax. Multiple small neck lymph nodes. Other: None IMPRESSION: 1. No CT evidence for acute intracranial abnormality. Stable left mastoid and middle ear fluid. 2. Large left periorbital and forehead hematoma. No acute facial bone fracture. 3. Straightening of the cervical spine with degenerative changes. No acute osseous abnormality 4. Biapical emphysema Electronically Signed   By: Donavan Foil M.D.   On: 12/27/2016 00:26    Procedures Procedures (including critical care time)  Medications Ordered in ED Medications - No data to display   Initial Impression / Assessment and Plan / ED Course  I have reviewed the triage vital signs and the nursing notes.  Pertinent labs & imaging results that were available during my care of the patient were reviewed by me and considered in my medical decision making (see chart for details).     BP 132/65   Pulse 60   Resp (!) 26   SpO2 94%    Final Clinical Impressions(s) / ED Diagnoses   Final diagnoses:  Fall at nursing home, initial encounter  Contusion of face, initial encounter  Finger laceration, initial encounter    New Prescriptions New Prescriptions   ACETAMINOPHEN (TYLENOL) 500 MG TABLET    Take 1 tablet (500 mg total) by mouth every 6 (six) hours as needed.   Pt here for evaluation of an unwitnessed fall.  Recently diagnosed with new onset seizure, currently on Depakote.  On exam, he has skin tear to scalp, hematoma to forehead and L eyebrow, and superficial lac to fingers of L hand.  Head/maxillofacial/Cervical spine/L hand image without acute fx/dislocation.  No acute intracranial injury. Labs are reassuring, depakote level is therapeutic.  Unsure if this is a fall vs seizure but no tongue biting.  Pt has dementia.  After discussing with Dr. Regenia Skeeter, plan to have pt return back to his memory care facility.  Ice pack over  hematoma.  Sterile strips to skin tears on scalp and L hand.    Pt's son is in the room and in agreement with plan.     Domenic Moras, PA-C 12/27/16 [place], MD 01/06/17 [phone]

## 2016-12-27 LAB — BASIC METABOLIC PANEL
Anion gap: 5 (ref 5–15)
BUN: 8 mg/dL (ref 6–20)
CO2: 30 mmol/L (ref 22–32)
Calcium: 8.5 mg/dL — ABNORMAL LOW (ref 8.9–10.3)
Chloride: 101 mmol/L (ref 101–111)
Creatinine, Ser: 0.6 mg/dL — ABNORMAL LOW (ref 0.61–1.24)
GFR calc Af Amer: >60 mL/min (ref >60)
GFR calc non Af Amer: >60 mL/min (ref >60)
Glucose, Bld: 91 mg/dL (ref 65–99)
Potassium: 3.7 mmol/L (ref 3.5–5.1)
Sodium: 136 mmol/L (ref 135–145)

## 2016-12-27 LAB — VALPROIC ACID LEVEL: Valproic Acid Lvl: 91 ug/mL (ref 50.0–100.0)

## 2016-12-27 MED ORDER — ACETAMINOPHEN 500 MG PO TABS: 500.0000 mg | ORAL_TABLET | Freq: Four times a day (QID) | ORAL | 0 refills | Status: DC | PRN

## 2016-12-27 NOTE — Discharge Instructions (Signed)
You have been evaluated for your fall.  Fortunately no broken bones identified.  Take tylenol as needed for pain.  Allow sterile strips to be in place to help aid healing.  Follow up with your doctor for further care.

## 2016-12-27 NOTE — ED Notes (Signed)
Pt taken back to [facility] by son Steve Norris.

## 2016-12-28 LAB — CULTURE, BLOOD (ROUTINE X 2)
CULTURE: NO GROWTH
CULTURE: NO GROWTH
Special Requests: ADEQUATE
Special Requests: ADEQUATE

## 2016-12-31 ENCOUNTER — Ambulatory Visit (INDEPENDENT_AMBULATORY_CARE_PROVIDER_SITE_OTHER): Payer: PPO | Admitting: Physician Assistant

## 2016-12-31 ENCOUNTER — Encounter: Payer: Self-pay | Admitting: Physician Assistant

## 2016-12-31 VITALS — BP 136/66 | HR 64 | Ht 70.0 in | Wt 158.0 lb

## 2016-12-31 DIAGNOSIS — I5032 Chronic diastolic (congestive) heart failure: Secondary | ICD-10-CM | POA: Diagnosis not present

## 2016-12-31 DIAGNOSIS — I480 Paroxysmal atrial fibrillation: Secondary | ICD-10-CM

## 2016-12-31 DIAGNOSIS — J449 Chronic obstructive pulmonary disease, unspecified: Secondary | ICD-10-CM | POA: Diagnosis not present

## 2016-12-31 DIAGNOSIS — Z950 Presence of cardiac pacemaker: Secondary | ICD-10-CM

## 2016-12-31 DIAGNOSIS — I2510 Atherosclerotic heart disease of native coronary artery without angina pectoris: Secondary | ICD-10-CM

## 2016-12-31 DIAGNOSIS — D649 Anemia, unspecified: Secondary | ICD-10-CM

## 2016-12-31 DIAGNOSIS — Z7189 Other specified counseling: Secondary | ICD-10-CM | POA: Diagnosis not present

## 2016-12-31 DIAGNOSIS — R569 Unspecified convulsions: Secondary | ICD-10-CM

## 2016-12-31 DIAGNOSIS — Z79899 Other long term (current) drug therapy: Secondary | ICD-10-CM

## 2016-12-31 NOTE — Progress Notes (Signed)
Cardiology Office Note    Date:  01/02/2017   ID:  Abdulmalik, Darco Sep 08, 1929, MRN [id number]  PCP:  Deland Pretty, MD  Cardiologist:  Dr. Sallyanne Kuster  Chief Complaint  Patient presents with  . Follow-up    seen for Dr. Sallyanne Kuster   Patient is DNR as of office visit 12/31/2016  History of Present Illness:  Steve Norris is a 83 y.o. male with PMH of sinus node dysfunction (s/p PPM 2011), PAF on Coumadin, CAD s/p prior stent in 2006 to LAD and RCA, COPD, dementia and anemia. He was admitted in April 2018 with acute diastolic heart failure. Echocardiogram performed at the time showed EF 60-65%, grade 2 DD. He was diuresed and placed on Lasix 20 mg daily. He was last seen in the office on 09/30/2016 by Bernerd Pho at which time he was doing well. He was switched to eliquis at the time. He is also on sotalol and Bystolic. He was sent to the hospital in late August with altered mental status, new onset seizures and was started on Depakote. He was evaluated by neurology. CT of head and neck was unremarkable for acute process. His Bystolic was discontinued. He was also seen in the ED on 12/26/2013 for follow-up.  He cannot recall the reason he fell recently. He denies any significant dizziness, chest discomfort or lower extremity edema. He does have shortness of breath only when ambulating. He ambulated with a walker and is able to get from where he lives in [place] to the [employer]. He is being accompanied by his son today. I am unable to figure out why his Bystolic was recently discontinued during hospitalization, however he is maintaining sinus rhythm with regular heart rate on physical exam. He is doing quite well. He has been having more fatigue recently, I think this may be related to the Depakote that was started recently. I asked him to discuss this with his primary care provider or neurology service. Otherwise he is doing well on cardiology perspective. After the recent fall, he did  have a laceration with significant amount of bleeding. I will check a CBC today to make sure hemoglobin has improved. He can follow-up in 3 months, he will need a device interrogation that time.  Both family and the patient expressed wish to be DO NOT RESUSCITATE, I have explained to the patient what it means to be DO NOT RESUSCITATE, specifically chest compression, ACLS drugs, shock, and intubation. He does not wish to pursue any of them if his heart were to ever stop or if he ever stop breathing. His son who is at bedside also expressed his wish and agrees with the patient's disposition. His son is the power of attorney. We have filled out DO NOT RESUSCITATE form in the office today.   Past Medical History:  Diagnosis Date  . Arthritis   . Asthma   . Barrett's esophagus 03-19-2009   EGD  . CAD (coronary artery disease)    a. s/p stent placement in 2006 to the LAD and RCA  . Cataract    REMOVED  . COPD (chronic obstructive pulmonary disease) ([place])   . Diverticulosis 03-19-2009   Colonoscopy   . Erosive esophagitis 03-19-2009   EGD  . GERD (gastroesophageal reflux disease)   . Hiatal hernia 03-19-2009   EGD  . Hyperlipidemia   . Hypertension   . Internal hemorrhoids 03-19-2009   Colonoscopy  . Paroxysmal atrial fibrillation (H[facility])   . Sinus node dysfunction (  [place])    a. s/p PPM placement in 2011    Past Surgical History:  Procedure Laterality Date  . CARDIAC CATHETERIZATION  02/12/2009   mild ostial left main disease,patent LAD & RCA stents   . CARDIOVERSION N/A 11/06/2014   Procedure: CARDIOVERSION;  Surgeon: [name], MD;  Location: [facility] ENDOSCOPY;  Service: Cardiovascular;  Laterality: N/A;  . CARDIOVERSION N/A 08/02/2015   Procedure: CARDIOVERSION;  Surgeon: Sueanne Margarita, MD;  Location: [facility] ENDOSCOPY;  Service: Cardiovascular;  Laterality: N/A;  . CATARACT EXTRACTION    . COLONOSCOPY    . CORONARY STENT PLACEMENT  2006   Right Coronary Artery Cypher stents placed 2006    . HEMORRHOID SURGERY    . LEFT HEART CATHETERIZATION WITH CORONARY ANGIOGRAM N/A 07/01/2013   Procedure: LEFT HEART CATHETERIZATION WITH CORONARY ANGIOGRAM;  Surgeon: Sanda Klein, MD;  Location: [place] CATH LAB;  Service: Cardiovascular;  Laterality: N/A;  . NM MYOCAR PERF WALL MOTION  08/14/2008   no significant ischemia EF 64%  . PACEMAKER INSERTION  07/25/2009   [employer] Accent  . TEE WITHOUT CARDIOVERSION N/A 08/02/2015   Procedure: TRANSESOPHAGEAL ECHOCARDIOGRAM (TEE);  Surgeon: Sueanne Margarita, MD;  Location: [facility] ENDOSCOPY;  Service: Cardiovascular;  Laterality: N/A;  . TONSILLECTOMY    . US ECHOCARDIOGRAPHY  07/12/2009   borderline LA enlargement,mild mitral annular ca+, AOV mildly sclerotic,trace AI.  Marland Kitchen VASECTOMY      Current Medications: Outpatient Medications Prior to Visit  Medication Sig Dispense Refill  . acetaminophen (TYLENOL) 500 MG tablet Take 1 tablet (500 mg total) by mouth every 6 (six) hours as needed. 30 tablet 0  . albuterol (ACCUNEB) 0.63 MG/3ML nebulizer solution Take 1 ampule by nebulization every 8 (eight) hours as needed for wheezing.    Marland Kitchen apixaban (ELIQUIS) 5 MG TABS tablet Take 5 mg by mouth 2 (two) times daily.    . divalproex (DEPAKOTE) 250 MG DR tablet Take 3 tablets (750 mg total) by mouth every 12 (twelve) hours. 60 tablet 0  . finasteride (PROSCAR) 5 MG tablet Take 5 mg by mouth daily.     . furosemide (LASIX) 20 MG tablet Take 1 tablet (20 mg total) by mouth daily. (Patient taking differently: Take 40 mg by mouth daily. ) 30 tablet 6  . Multiple Vitamins-Minerals (MULTIVITAMIN WITH MINERALS) tablet Take 1 tablet by mouth daily.    . nitroGLYCERIN (NITROSTAT) 0.4 MG SL tablet Place 0.4 mg under the tongue every 5 (five) minutes as needed for chest pain.     . potassium chloride SA (K-DUR,KLOR-CON) 20 MEQ tablet take 1 tablet by mouth once daily (Patient taking differently: take 45mEq by mouth once daily) 30 tablet 6  . rosuvastatin (CRESTOR) 20 MG tablet Take 20  mg by mouth daily.     Marland Kitchen senna-docusate (SENNA-S) 8.6-50 MG per tablet Take 1 tablet by mouth as needed for mild constipation.     . sertraline (ZOLOFT) 50 MG tablet Take 100 mg by mouth daily.     . sotalol (BETAPACE) 80 MG tablet take 1 tablet by mouth every 12 hours (Patient taking differently: take 1 tablet by mouth twice daily) 60 tablet 5  . tamsulosin (FLOMAX) 0.4 MG CAPS capsule Take 1 capsule by mouth daily.    . temazepam (RESTORIL) 30 MG capsule Take 30 mg by mouth at bedtime.     No facility-administered medications prior to visit.      Allergies:   Amiodarone; Bactrim [sulfamethoxazole-trimethoprim]; and Penicillins   Social History  Social History  . Marital status: Married    Spouse name: N/A  . Number of children: N/A  . Years of education: N/A   Occupational History  . Retired     Social History Main Topics  . Smoking status: Former Research scientist (life sciences)  . Smokeless tobacco: Never Used     Comment: Quit smoking 1996  . Alcohol use No  . Drug use: No  . Sexual activity: Not Asked   Other Topics Concern  . None   Social History Narrative   Daily caffeine      Family History:  The patient's family history includes Heart disease in his father and mother.   ROS:   Please see the history of present illness.    ROS All other systems reviewed and are negative.   PHYSICAL EXAM:   VS:  BP 136/66   Pulse 64   Ht 5\' 10"  (1.778 m)   Wt 158 lb (71.7 kg)   SpO2 95%   BMI 22.67 kg/m    GEN: Well nourished, well developed, in no acute distress  HEENT: normal  Neck: no JVD, carotid bruits, or masses Cardiac: RRR; no murmurs, rubs, or gallops,no edema  Respiratory:  clear to auscultation bilaterally, normal work of breathing GI: soft, nontender, nondistended, + BS MS: no deformity or atrophy  Skin: warm and dry, no rash Neuro:  Alert and Oriented x 3, Strength and sensation are intact Psych: euthymic mood, full affect  Wt Readings from Last 3 Encounters:  12/31/16  158 lb (71.7 kg)  12/25/16 169 lb (76.7 kg)  09/30/16 157 lb 3.2 oz (71.3 kg)      Studies/Labs Reviewed:   EKG:  EKG is not ordered today.   Recent Labs: 04/30/2016: TSH 2.620 08/08/2016: B Natriuretic Peptide 559.5 12/23/2016: Magnesium 1.9 12/24/2016: ALT 19 12/26/2016: BUN 8; Creatinine, Ser 0.60; Potassium 3.7; Sodium 136 12/31/2016: Hemoglobin 11.4; Platelets 212   Lipid Panel No results found for: CHOL, TRIG, HDL, CHOLHDL, VLDL, LDLCALC, LDLDIRECT  Additional studies/ records that were reviewed today include:   Echo 08/09/2016 LV EF: 60% -   65%  ------------------------------------------------------------------- Indications:      CHF - 428.0.  ------------------------------------------------------------------- History:   PMH:  Chf- diastolic. CHF exacerbation. Atrial fibrillation with H/O SSS S/P pacemaker. Essential hypertension. COPD. Hyperlipidemia. BPH.  ------------------------------------------------------------------- Study Conclusions  - Left ventricle: The cavity size was normal. Wall thickness was   increased in a pattern of mild LVH. Systolic function was normal.   The estimated ejection fraction was in the range of 60% to 65%.   Wall motion was normal; there were no regional wall motion   abnormalities. Features are consistent with a pseudonormal left   ventricular filling pattern, with concomitant abnormal relaxation   and increased filling pressure (grade 2 diastolic dysfunction). - Left atrium: The atrium was mildly to moderately dilated. - Right atrium: The atrium was moderately to severely dilated.    ASSESSMENT:    1. Seizure ([place])   2. Anemia, unspecified type   3. Encounter for long-term (current) use of medications   4. Pacemaker   5. PAF (paroxysmal atrial fibrillation) ([place])   6. Coronary artery disease involving native coronary artery of native heart without angina pectoris   7. Chronic obstructive pulmonary disease, unspecified  COPD type ([place])   8. Chronic diastolic heart failure ([place])   9. DNR (do not resuscitate) discussion      PLAN:  In order of problems listed above:  1. Possible seizure: Currently  on Depakote after recent admission. He has been complaining of some fatigue which I think may be related to Depakote. Otherwise he appears to be stable from cardiology perspective.  2. Sinus nodal dysfunction s/p PPM: Managed by Dr. Sallyanne Kuster  3. PAF on eliquis: No obvious bleeding issues, does have some degree of anemia, we will obtain CBC given his fatigue.  4. CAD: No obvious chest pain recently.  5. Chronic diastolic heart failure: Euvolemic on physical exam  6. DO NOT RESUSCITATE discussion: I have discussed both with the patient and his son who is his power of attorney what it means to be DO NOT RESUSCITATE including chest compression, intubation, ACLS medications, and shock, he does not want any of those if his heart was to have ever stop or if he stop breathing. I have filled out his DO NOT RESUSCITATE form.    Medication Adjustments/Labs and Tests Ordered: Current medicines are reviewed at length with the patient today.  Concerns regarding medicines are outlined above.  Medication changes, Labs and Tests ordered today are listed in the Patient Instructions below. Patient Instructions  Medication Instructions:   No changes to current medications  Labwork:   CBC today  Testing/Procedures:  none  Follow-Up:  In 3 months with Dr. Sallyanne Kuster (Device check)  If you need a refill on your cardiac medications before your next appointment, please call your pharmacy.      Hilbert Corrigan, [place]  01/02/2017 11:02 AM    [address], [place], [place]  [address] Phone: [phone]; Fax: [phone]

## 2016-12-31 NOTE — Patient Instructions (Signed)
Medication Instructions:   No changes to current medications  Labwork:   CBC today  Testing/Procedures:  none  Follow-Up:  In 3 months with Dr. Sallyanne Kuster (Device check)  If you need a refill on your cardiac medications before your next appointment, please call your pharmacy.

## 2016-12-31 NOTE — Progress Notes (Signed)
Patient's paperwork from [place] was left behind in exam room. Hao filled these out w recommendation of no change to medications, CBC drawn today (in office) and f/u in 3 months (scheduled).  I phoned [facility] and spoke to [place] at reception. Informed her we would fax sheet with orders and addendums, if any questions I advised to call our number which is listed on the paperwork as well. She voiced acknowledgment and thanks, stated if any questions or concerns she will have nursing staff reach out.

## 2017-01-01 ENCOUNTER — Telehealth: Payer: Self-pay | Admitting: Cardiovascular Disease

## 2017-01-01 LAB — CBC
Hematocrit: 35.3 % — ABNORMAL LOW (ref 37.5–51.0)
Hemoglobin: 11.4 g/dL — ABNORMAL LOW (ref 13.0–17.7)
MCH: 27.9 pg (ref 26.6–33.0)
MCHC: 32.3 g/dL (ref 31.5–35.7)
MCV: 87 fL (ref 79–97)
PLATELETS: 212 10*3/uL (ref 150–379)
RBC: 4.08 x10E6/uL — AB (ref 4.14–5.80)
RDW: 18.4 % — ABNORMAL HIGH (ref 12.3–15.4)
WBC: 6.2 10*3/uL (ref 3.4–10.8)

## 2017-01-01 NOTE — Progress Notes (Signed)
Red blood cell count continue to improve

## 2017-01-01 NOTE — Telephone Encounter (Signed)
Returning your call. °

## 2017-01-02 ENCOUNTER — Encounter: Payer: Self-pay | Admitting: Physician Assistant

## 2017-01-02 ENCOUNTER — Telehealth: Payer: Self-pay | Admitting: Diagnostic Neuroimaging

## 2017-01-02 NOTE — Telephone Encounter (Addendum)
Patient of DR Pharr's and Dr Clydene Fake, who took 500 mg Depakote ER 24 hrs until hospitalized and evaluated for a seizure- medication adjusted ,  Now on 750 mg twice daily ER and lethargic.  I spoke to his care coordinator,   NP Tillie Rung  at senior home to get a blood level and ammonia , this was a verbal order- and reduce the dose for  now and the coming week to 500 mg bid tab,he does not need a prescription, he has 250 mg immediate release tabs available. Tillie Rung reviewed his medical record and Steve Norris had moved to [place] in June 2018 and NO DEPOKOTE was on his active medication list at the time, but Dr Lenell Antu notes have mentioned it earlier this year. He may have not been on any depakote since moving in. In that case 750 mg bid would be a high dose for a geriatric patient.   Further work up with primary neurologist. CD

## 2017-01-02 NOTE — Telephone Encounter (Signed)
Tillie Rung is calling re: the divalproex (DEPAKOTE) 250 MG DR tablet pt is on.  She states this is a high dosage that has pt now very lathargic, hard to get him to eat meals, unable to stand on own, pt has fallen once and has had to go back to hospital.  Pt family now at the facility with pt wanting him taken off but has been advised with this high dosage of the medication not to do that. Kendra(care coordinator) is calling because she is unaware of what to do.  She is asking to be called @ [phone]

## 2017-01-02 NOTE — Telephone Encounter (Signed)
Has appt. here 02-16-17 with Dr. Leta Baptist.

## 2017-01-02 NOTE — Telephone Encounter (Addendum)
Pt's son [phone]) called he said the pt was at the hospital last with seizure. When discharged the depakote was increased to 750 bid. Since that time the pt is very lethargic. He would like the return call to be directed to [facility] @ [place] for a medication doseage change. He is very concerned for his father

## 2017-01-02 NOTE — Telephone Encounter (Signed)
Order signed by Dr. Brett Fairy and fax confirmation received to [facility]. [phone]. New dose, depakote 500mg  po bid.

## 2017-01-05 DIAGNOSIS — H18413 Arcus senilis, bilateral: Secondary | ICD-10-CM | POA: Diagnosis not present

## 2017-01-05 DIAGNOSIS — H43813 Vitreous degeneration, bilateral: Secondary | ICD-10-CM | POA: Diagnosis not present

## 2017-01-05 DIAGNOSIS — G301 Alzheimer's disease with late onset: Secondary | ICD-10-CM | POA: Diagnosis not present

## 2017-01-05 DIAGNOSIS — H35033 Hypertensive retinopathy, bilateral: Secondary | ICD-10-CM | POA: Diagnosis not present

## 2017-01-05 DIAGNOSIS — H353131 Nonexudative age-related macular degeneration, bilateral, early dry stage: Secondary | ICD-10-CM | POA: Diagnosis not present

## 2017-01-05 DIAGNOSIS — F331 Major depressive disorder, recurrent, moderate: Secondary | ICD-10-CM | POA: Diagnosis not present

## 2017-01-05 DIAGNOSIS — Z961 Presence of intraocular lens: Secondary | ICD-10-CM | POA: Diagnosis not present

## 2017-01-05 DIAGNOSIS — F0281 Dementia in other diseases classified elsewhere with behavioral disturbance: Secondary | ICD-10-CM | POA: Diagnosis not present

## 2017-01-06 DIAGNOSIS — S61218D Laceration without foreign body of other finger without damage to nail, subsequent encounter: Secondary | ICD-10-CM | POA: Diagnosis not present

## 2017-01-06 DIAGNOSIS — N400 Enlarged prostate without lower urinary tract symptoms: Secondary | ICD-10-CM | POA: Diagnosis not present

## 2017-01-06 DIAGNOSIS — M6281 Muscle weakness (generalized): Secondary | ICD-10-CM | POA: Diagnosis not present

## 2017-01-06 DIAGNOSIS — J449 Chronic obstructive pulmonary disease, unspecified: Secondary | ICD-10-CM | POA: Diagnosis not present

## 2017-01-06 DIAGNOSIS — I482 Chronic atrial fibrillation: Secondary | ICD-10-CM | POA: Diagnosis not present

## 2017-01-06 DIAGNOSIS — S0101XD Laceration without foreign body of scalp, subsequent encounter: Secondary | ICD-10-CM | POA: Diagnosis not present

## 2017-01-06 DIAGNOSIS — I5042 Chronic combined systolic (congestive) and diastolic (congestive) heart failure: Secondary | ICD-10-CM | POA: Diagnosis not present

## 2017-01-06 DIAGNOSIS — I110 Hypertensive heart disease with heart failure: Secondary | ICD-10-CM | POA: Diagnosis not present

## 2017-01-06 DIAGNOSIS — H6120 Impacted cerumen, unspecified ear: Secondary | ICD-10-CM | POA: Diagnosis not present

## 2017-01-06 DIAGNOSIS — E785 Hyperlipidemia, unspecified: Secondary | ICD-10-CM | POA: Diagnosis not present

## 2017-01-06 DIAGNOSIS — Z950 Presence of cardiac pacemaker: Secondary | ICD-10-CM | POA: Diagnosis not present

## 2017-01-06 DIAGNOSIS — I5033 Acute on chronic diastolic (congestive) heart failure: Secondary | ICD-10-CM | POA: Diagnosis not present

## 2017-01-06 DIAGNOSIS — F445 Conversion disorder with seizures or convulsions: Secondary | ICD-10-CM | POA: Diagnosis not present

## 2017-01-06 DIAGNOSIS — F0390 Unspecified dementia without behavioral disturbance: Secondary | ICD-10-CM | POA: Diagnosis not present

## 2017-01-06 DIAGNOSIS — D649 Anemia, unspecified: Secondary | ICD-10-CM | POA: Diagnosis not present

## 2017-01-06 DIAGNOSIS — I2510 Atherosclerotic heart disease of native coronary artery without angina pectoris: Secondary | ICD-10-CM | POA: Diagnosis not present

## 2017-01-06 DIAGNOSIS — K219 Gastro-esophageal reflux disease without esophagitis: Secondary | ICD-10-CM | POA: Diagnosis not present

## 2017-01-06 DIAGNOSIS — M1991 Primary osteoarthritis, unspecified site: Secondary | ICD-10-CM | POA: Diagnosis not present

## 2017-01-06 DIAGNOSIS — M109 Gout, unspecified: Secondary | ICD-10-CM | POA: Diagnosis not present

## 2017-01-06 DIAGNOSIS — I495 Sick sinus syndrome: Secondary | ICD-10-CM | POA: Diagnosis not present

## 2017-01-06 DIAGNOSIS — K5790 Diverticulosis of intestine, part unspecified, without perforation or abscess without bleeding: Secondary | ICD-10-CM | POA: Diagnosis not present

## 2017-01-07 DIAGNOSIS — R531 Weakness: Secondary | ICD-10-CM | POA: Diagnosis not present

## 2017-01-09 DIAGNOSIS — K219 Gastro-esophageal reflux disease without esophagitis: Secondary | ICD-10-CM | POA: Diagnosis not present

## 2017-01-09 DIAGNOSIS — S61218D Laceration without foreign body of other finger without damage to nail, subsequent encounter: Secondary | ICD-10-CM | POA: Diagnosis not present

## 2017-01-09 DIAGNOSIS — I482 Chronic atrial fibrillation: Secondary | ICD-10-CM | POA: Diagnosis not present

## 2017-01-09 DIAGNOSIS — N400 Enlarged prostate without lower urinary tract symptoms: Secondary | ICD-10-CM | POA: Diagnosis not present

## 2017-01-09 DIAGNOSIS — J449 Chronic obstructive pulmonary disease, unspecified: Secondary | ICD-10-CM | POA: Diagnosis not present

## 2017-01-09 DIAGNOSIS — K5790 Diverticulosis of intestine, part unspecified, without perforation or abscess without bleeding: Secondary | ICD-10-CM | POA: Diagnosis not present

## 2017-01-09 DIAGNOSIS — F0390 Unspecified dementia without behavioral disturbance: Secondary | ICD-10-CM | POA: Diagnosis not present

## 2017-01-09 DIAGNOSIS — S0101XD Laceration without foreign body of scalp, subsequent encounter: Secondary | ICD-10-CM | POA: Diagnosis not present

## 2017-01-09 DIAGNOSIS — I495 Sick sinus syndrome: Secondary | ICD-10-CM | POA: Diagnosis not present

## 2017-01-09 DIAGNOSIS — I5033 Acute on chronic diastolic (congestive) heart failure: Secondary | ICD-10-CM | POA: Diagnosis not present

## 2017-01-09 DIAGNOSIS — Z0000 Encounter for general adult medical examination without abnormal findings: Secondary | ICD-10-CM | POA: Diagnosis not present

## 2017-01-09 DIAGNOSIS — I2510 Atherosclerotic heart disease of native coronary artery without angina pectoris: Secondary | ICD-10-CM | POA: Diagnosis not present

## 2017-01-09 DIAGNOSIS — I110 Hypertensive heart disease with heart failure: Secondary | ICD-10-CM | POA: Diagnosis not present

## 2017-01-09 DIAGNOSIS — M1991 Primary osteoarthritis, unspecified site: Secondary | ICD-10-CM | POA: Diagnosis not present

## 2017-01-12 ENCOUNTER — Telehealth: Payer: Self-pay

## 2017-01-12 DIAGNOSIS — I110 Hypertensive heart disease with heart failure: Secondary | ICD-10-CM | POA: Diagnosis not present

## 2017-01-12 DIAGNOSIS — K219 Gastro-esophageal reflux disease without esophagitis: Secondary | ICD-10-CM | POA: Diagnosis not present

## 2017-01-12 DIAGNOSIS — N400 Enlarged prostate without lower urinary tract symptoms: Secondary | ICD-10-CM | POA: Diagnosis not present

## 2017-01-12 DIAGNOSIS — S61218D Laceration without foreign body of other finger without damage to nail, subsequent encounter: Secondary | ICD-10-CM | POA: Diagnosis not present

## 2017-01-12 DIAGNOSIS — M1991 Primary osteoarthritis, unspecified site: Secondary | ICD-10-CM | POA: Diagnosis not present

## 2017-01-12 DIAGNOSIS — I2510 Atherosclerotic heart disease of native coronary artery without angina pectoris: Secondary | ICD-10-CM | POA: Diagnosis not present

## 2017-01-12 DIAGNOSIS — I5033 Acute on chronic diastolic (congestive) heart failure: Secondary | ICD-10-CM | POA: Diagnosis not present

## 2017-01-12 DIAGNOSIS — I482 Chronic atrial fibrillation: Secondary | ICD-10-CM | POA: Diagnosis not present

## 2017-01-12 DIAGNOSIS — I495 Sick sinus syndrome: Secondary | ICD-10-CM | POA: Diagnosis not present

## 2017-01-12 DIAGNOSIS — K5790 Diverticulosis of intestine, part unspecified, without perforation or abscess without bleeding: Secondary | ICD-10-CM | POA: Diagnosis not present

## 2017-01-12 DIAGNOSIS — S0101XD Laceration without foreign body of scalp, subsequent encounter: Secondary | ICD-10-CM | POA: Diagnosis not present

## 2017-01-12 DIAGNOSIS — F0390 Unspecified dementia without behavioral disturbance: Secondary | ICD-10-CM | POA: Diagnosis not present

## 2017-01-12 DIAGNOSIS — J449 Chronic obstructive pulmonary disease, unspecified: Secondary | ICD-10-CM | POA: Diagnosis not present

## 2017-01-12 NOTE — Telephone Encounter (Signed)
Rn call [facility] at [phone]. Rn spoke with Steve Norris in the memory care unit. Rn stated the valproic acid level was normal, but the ammonia level was high. Rn stated the PCP or facility can address the ammonia level. RN stated the pt was last seen 04/2016, and needs to be seen sooner to discuss the medication. Steve Norris stated the family wants pt to stop taking the depakote. Steve Norris stated to family the medication cannot be stop,unless the MD sees pt and evaluate him. APPt made for sooner appt with Dr. Leonie Man. Steve Norris will notify family.

## 2017-01-13 DIAGNOSIS — E7220 Disorder of urea cycle metabolism, unspecified: Secondary | ICD-10-CM | POA: Diagnosis not present

## 2017-01-13 DIAGNOSIS — R531 Weakness: Secondary | ICD-10-CM | POA: Diagnosis not present

## 2017-01-19 DIAGNOSIS — G301 Alzheimer's disease with late onset: Secondary | ICD-10-CM | POA: Diagnosis not present

## 2017-01-19 DIAGNOSIS — F331 Major depressive disorder, recurrent, moderate: Secondary | ICD-10-CM | POA: Diagnosis not present

## 2017-01-19 DIAGNOSIS — I5033 Acute on chronic diastolic (congestive) heart failure: Secondary | ICD-10-CM | POA: Diagnosis not present

## 2017-01-19 DIAGNOSIS — J449 Chronic obstructive pulmonary disease, unspecified: Secondary | ICD-10-CM | POA: Diagnosis not present

## 2017-01-19 DIAGNOSIS — F0281 Dementia in other diseases classified elsewhere with behavioral disturbance: Secondary | ICD-10-CM | POA: Diagnosis not present

## 2017-01-19 DIAGNOSIS — S0101XD Laceration without foreign body of scalp, subsequent encounter: Secondary | ICD-10-CM | POA: Diagnosis not present

## 2017-01-19 DIAGNOSIS — S61218D Laceration without foreign body of other finger without damage to nail, subsequent encounter: Secondary | ICD-10-CM | POA: Diagnosis not present

## 2017-01-19 DIAGNOSIS — M1991 Primary osteoarthritis, unspecified site: Secondary | ICD-10-CM | POA: Diagnosis not present

## 2017-01-19 DIAGNOSIS — K219 Gastro-esophageal reflux disease without esophagitis: Secondary | ICD-10-CM | POA: Diagnosis not present

## 2017-01-19 DIAGNOSIS — I110 Hypertensive heart disease with heart failure: Secondary | ICD-10-CM | POA: Diagnosis not present

## 2017-01-19 DIAGNOSIS — I482 Chronic atrial fibrillation: Secondary | ICD-10-CM | POA: Diagnosis not present

## 2017-01-19 DIAGNOSIS — K5790 Diverticulosis of intestine, part unspecified, without perforation or abscess without bleeding: Secondary | ICD-10-CM | POA: Diagnosis not present

## 2017-01-19 DIAGNOSIS — I2510 Atherosclerotic heart disease of native coronary artery without angina pectoris: Secondary | ICD-10-CM | POA: Diagnosis not present

## 2017-01-19 DIAGNOSIS — F0390 Unspecified dementia without behavioral disturbance: Secondary | ICD-10-CM | POA: Diagnosis not present

## 2017-01-19 DIAGNOSIS — I495 Sick sinus syndrome: Secondary | ICD-10-CM | POA: Diagnosis not present

## 2017-01-19 DIAGNOSIS — N400 Enlarged prostate without lower urinary tract symptoms: Secondary | ICD-10-CM | POA: Diagnosis not present

## 2017-01-19 MED ORDER — DIVALPROEX SODIUM 250 MG PO DR TAB: 250.0000 mg | DELAYED_RELEASE_TABLET | Freq: Two times a day (BID) | ORAL | 0 refills | Status: DC

## 2017-01-19 NOTE — Telephone Encounter (Signed)
I have reviewed the patient's chart and concern about lethargy and tiredness. Valproic acid level was 57 but ammonia level was high hence I agree in reducing the dose of depakote  DR to 250 milligrams twice daily. Repeat valproic acid and ammonia level in 1 week.

## 2017-01-19 NOTE — Telephone Encounter (Signed)
Dr.Sethi reviewed chart. He decrease the depakote to 250mg  bid. IN one week after taking the decrease dosage he wants pt to have his ammonia,and valproic acid level check. Rx and order form from [facility] [place] fax to [phone]. Fax confirmed.

## 2017-01-20 ENCOUNTER — Ambulatory Visit (INDEPENDENT_AMBULATORY_CARE_PROVIDER_SITE_OTHER): Payer: PPO | Admitting: Neurology

## 2017-01-20 ENCOUNTER — Encounter: Payer: Self-pay | Admitting: Neurology

## 2017-01-20 VITALS — BP 127/65 | HR 84 | Wt 158.2 lb

## 2017-01-20 DIAGNOSIS — G40909 Epilepsy, unspecified, not intractable, without status epilepticus: Secondary | ICD-10-CM

## 2017-01-20 NOTE — Patient Instructions (Signed)
I had a long discussion with the patient and his son regarding his recent diagnosis of seizures and need for long-term anticonvulsant therapy Patient had trouble tolerating Depakote in a higher dose and hence recommend 250 mg twice daily which he seems to be tolerating better. If he has breakthrough seizures may need to consider dose or switch to alternative medication. Have advised patient to avoid seizure provoking stimuli like sleep deprivation and medication noncompliance. He was advised to use his walker at all times for fall and safety purposes. Continue eliquis for atrial fibrillation and stroke prevention and maintain strict control of hypertension with blood pressure goal below 130/90 and lipids with LDL cholesterol goal below 70 mg percent. He'll return for follow-up in 6 months with  my nurse practitioner call earlier if necessary.   Seizure, Adult A seizure is a sudden burst of abnormal electrical activity in the brain. The abnormal activity temporarily interrupts normal brain function, causing a person to experience any of the following:  Involuntary movements.  Changes in awareness or consciousness.  Uncontrollable shaking (convulsions).  Seizures usually last from 30 seconds to 2 minutes. They usually do not cause permanent brain damage unless they are prolonged. What can cause a seizure to happen? Seizures can happen for many reasons including:  A fever.  Low blood sugar.  A medicine.  An illnesses.  A brain injury.  Some people who have a seizure never have another one. People who have repeated seizures have a condition called epilepsy. What are the symptoms of a seizure? Symptoms of a seizure vary greatly from person to person. They include:  Convulsions.  Stiffening of the body.  Involuntary movements of the arms or legs.  Loss of consciousness.  Breathing problems.  Falling suddenly.  Confusion.  Head nodding.  Eye blinking or fluttering.  Lip  smacking.  Drooling.  Rapid eye movements.  Grunting.  Loss of bladder control and bowel control.  Staring.  Unresponsiveness.  Some people have symptoms right before a seizure happens (aura) and right after a seizure happens. Symptoms of an aura include:  Fear or anxiety.  Nausea.  Feeling like the room is spinning (vertigo).  A feeling of having seen or heard something before (deja vu).  Odd tastes or smells.  Changes in vision, such as seeing flashing lights or spots.  Symptoms that may follow a seizure include:  Confusion.  Sleepiness.  Headache.  Weakness of one side of the body.  Follow these instructions at home: Medicines   Take over-the-counter and prescription medicines only as told by your health care provider.  Avoid any substances that may prevent your medicine from working properly, such as alcohol. Activity  Do not drive, swim, or do any other activities that would be dangerous if you had another seizure. Wait until your health care provider approves.  If you live in the [place] check with your local DMV (department of motor vehicles) to find out about the local driving laws. Each state has specific rules about when you can legally return to driving.  Get enough rest. Lack of sleep can make seizures more likely to occur. Educating others Teach friends and family what to do if you have a seizure. They should:  Lay you on the ground to prevent a fall.  Cushion your head and body.  Loosen any tight clothing around your neck.  Turn you on your side. If vomiting occurs, this helps keep your airway clear.  Stay with you until you recover.  Not  hold you down. Holding you down will not stop the seizure.  Not put anything in your mouth.  Know whether or not you need emergency care.  General instructions  Contact your health care provider each time you have a seizure.  Avoid anything that has ever triggered a seizure for you.  Keep a  seizure diary. Record what you remember about each seizure, especially anything that might have triggered the seizure.  Keep all follow-up visits as told by your health care provider. This is important. Contact a health care provider if:  You have another seizure.  You have seizures more often.  Your seizure symptoms change.  You continue to have seizures with treatment.  You have symptoms of an infection or illness. They might increase your risk of having a seizure. Get help right away if:  You have a seizure: ? That lasts longer than 5 minutes. ? That is different than previous seizures. ? That leaves you unable to speak or use a part of your body. ? That makes it harder to breathe. ? After a head injury.  You have: ? Multiple seizures in a row. ? Confusion or a severe headache right after a seizure.  You are having seizures more often.  You do not wake up immediately after a seizure.  You injure yourself during a seizure. These symptoms may represent a serious problem that is an emergency. Do not wait to see if the symptoms will go away. Get medical help right away. Call your local emergency services (911 in the [place] Do not drive yourself to the hospital. This information is not intended to replace advice given to you by your health care provider. Make sure you discuss any questions you have with your health care provider. Document Released: 04/11/2000 Document Revised: 12/09/2015 Document Reviewed: 11/16/2015 Elsevier Interactive Patient Education  2017 [employer].

## 2017-01-20 NOTE — Progress Notes (Signed)
[facility] [address] [place]. [place] [address] [phone]       OFFICE FOLLOW UP VISIT NOTE  Mr. Steve Norris Date of Birth:  1929-10-27 Medical Record Number:  [id number]   Referring MD:  Deland Pretty Reason for Referral:   confusion HPI:  Initial Consult 04/30/16 :41 year Caucasian male who is accompanied today by his son who provides most of the history. Patient apparently  has been having some confusion, mood irritability and cognitive decline since last 1 month. Patient has also not been eating well and has lost some weight and has not been able to handle his affairs like paying bills. Patient has been under significant stress due to the health of his wife who is now in a nursing home. Patient has been on significant financial stress as well. Patient admits to being depressed but has not been started on any medications. He denies significant headaches, vision loss focal extremity weakness or numbness. He admits to being off balance and a few occasions but hasn't had no major falls or injuries. He has not had any workup for treatable causes of cognitive impairment. He does have chronic atrial fibrillation as well as pacemaker. He is had no recent brain imaging done. He has a history of TIA 5 years ago. Denies any recent fall, head injury or loss of consciousness. No history of seizures. Patient can still mostly manage his own affairs but the son has noticed some concern in recent times about his ability to continue to do so in the future Update 01/20/17 : Patient was admitted to the hospital on 12/23/16 episode of increased confusion, difficulty speaking and asking repetitive questions. While being transported by EMS he had a witnessed generalized tonic-clonic seizure described as right gaze deviation with full body jerking. Patient had previously been started by me on Depakote which he actually had not been taking. That was thought that he was taking 1 g a day hence he was  increased to 1500 mg daily. Patient was discharged home on this medication but felt very sleepy and lethargic and could barely be aroused. In fact better and has an ecchymosis on his left periorbital region. He had lab work done last week which showed valproic acid level of 58 but elevated ammonia level of 1:30. On my advice valproic acid dose was reduced to 250 twice daily. Patient seems to be doing better on this dose and is more arousable and interactive and not as sleepy. Patient had fallen and has left periorbital ecchymosis as well as minor abrasion on his scalp which are slowly improving. He is on eliquis for chronic atrial fibrillation. Patient is currently in his [facility] skilled nursing facility and getting physical and occupation therapy bed. Is able to walk with a walker but slowly. He states his stamina is poor. Patient did have a CT scan of the head done on 8/20 12/15/2016 which I have personally reviewed shows no acute abnormality. EEG showed mild generalized slowing without definite ongoing seizure activity. I had started the patient on Depakote during the last office visit which he took for only a few weeks and stopped as it did not help him ROS:   14 system review of systems is positive for  , fatigue, swelling in  left forehead, bruising, seizure, weakness, gait difficulty and all other systems negative  PMH:  Past Medical History:  Diagnosis Date  . Arthritis   . Asthma   . Barrett's esophagus 03-19-2009   EGD  .  CAD (coronary artery disease)    a. s/p stent placement in 2006 to the LAD and RCA  . Cataract    REMOVED  . COPD (chronic obstructive pulmonary disease) ([place])   . Diverticulosis 03-19-2009   Colonoscopy   . Erosive esophagitis 03-19-2009   EGD  . GERD (gastroesophageal reflux disease)   . Hiatal hernia 03-19-2009   EGD  . Hyperlipidemia   . Hypertension   . Internal hemorrhoids 03-19-2009   Colonoscopy  . Paroxysmal atrial fibrillation (HCC)   . Sinus  node dysfunction (HCC)    a. s/p PPM placement in 2011    Social History:  Social History   Social History  . Marital status: Married    Spouse name: N/A  . Number of children: N/A  . Years of education: N/A   Occupational History  . Retired     Social History Main Topics  . Smoking status: Former Research scientist (life sciences)  . Smokeless tobacco: Never Used     Comment: Quit smoking 1996  . Alcohol use No  . Drug use: No  . Sexual activity: Not on file   Other Topics Concern  . Not on file   Social History Narrative   Daily caffeine     Medications:   Current Outpatient Prescriptions on File Prior to Visit  Medication Sig Dispense Refill  . acetaminophen (TYLENOL) 500 MG tablet Take 1 tablet (500 mg total) by mouth every 6 (six) hours as needed. 30 tablet 0  . albuterol (ACCUNEB) 0.63 MG/3ML nebulizer solution Take 1 ampule by nebulization every 8 (eight) hours as needed for wheezing.    Marland Kitchen apixaban (ELIQUIS) 5 MG TABS tablet Take 5 mg by mouth 2 (two) times daily.    . divalproex (DEPAKOTE) 250 MG DR tablet Take 1 tablet (250 mg total) by mouth 2 (two) times daily. 60 tablet 0  . finasteride (PROSCAR) 5 MG tablet Take 5 mg by mouth daily.     . furosemide (LASIX) 20 MG tablet Take 1 tablet (20 mg total) by mouth daily. (Patient taking differently: Take 40 mg by mouth daily. ) 30 tablet 6  . Multiple Vitamins-Minerals (MULTIVITAMIN WITH MINERALS) tablet Take 1 tablet by mouth daily.    . nitroGLYCERIN (NITROSTAT) 0.4 MG SL tablet Place 0.4 mg under the tongue every 5 (five) minutes as needed for chest pain.     . potassium chloride SA (K-DUR,KLOR-CON) 20 MEQ tablet take 1 tablet by mouth once daily (Patient taking differently: take 42mEq by mouth once daily) 30 tablet 6  . rosuvastatin (CRESTOR) 20 MG tablet Take 20 mg by mouth daily.     Marland Kitchen senna-docusate (SENNA-S) 8.6-50 MG per tablet Take 1 tablet by mouth as needed for mild constipation.     . sertraline (ZOLOFT) 50 MG tablet Take 100 mg by  mouth daily.     . sotalol (BETAPACE) 80 MG tablet take 1 tablet by mouth every 12 hours (Patient taking differently: take 1 tablet by mouth twice daily) 60 tablet 5  . tamsulosin (FLOMAX) 0.4 MG CAPS capsule Take 1 capsule by mouth daily.    . temazepam (RESTORIL) 30 MG capsule Take 30 mg by mouth at bedtime.     No current facility-administered medications on file prior to visit.     Allergies:   Allergies  Allergen Reactions  . Amiodarone Nausea And Vomiting  . Bactrim [Sulfamethoxazole-Trimethoprim] Other (See Comments)    Unknown reaction. Listed on MAR  . Penicillins Hives, Swelling and Rash  Physical Exam General: Frail elderly Caucasian male, seated, in no evident distress Head: head normocephalic and atraumatic.   Neck: supple with no carotid or supraclavicular bruits Cardiovascular: regular rate and rhythm, no murmurs Musculoskeletal: no deformity Skin:  no rash. Abrasion over the scalp. Large periaortic by 2 ecchymosis on the left supraorbital  region. Vascular:  Normal pulses all extremities  Neurologic Exam Mental Status: Awake and fully alert. Oriented to place and time. Recent and remote memory intact. Attention span, concentration and fund of knowledge appropriate. Mood and affect appropriate. Mini-Mental status exam scored 28/30 with only one deficit in attention. Able to name 8 animals with four legs.   Cranial Nerves: Fundoscopic exam reveals sharp disc margins. Pupils equal, briskly reactive to light. Extraocular movements full without nystagmus. Visual fields full to confrontation. Hearing intact. Facial sensation intact. Face, tongue, palate moves normally and symmetrically.  Motor: Normal bulk and tone. Normal strength in all tested extremity muscles. Sensory.: intact to touch , pinprick , position and vibratory sensation.  Coordination: Rapid alternating movements normal in all extremities. Finger-to-nose and heel-to-shin performed accurately  bilaterally. Gait and Station: Arises from chair with slightt difficulty. Stance is stooped wide based. Uses a walker. Gait  Is cautious slightly off balance while standing on either foot unsupported. Not able to heel, toe and tandem walk   Reflexes: 1+ and symmetric. Toes downgoing.      ASSESSMENT: 49 year Caucasian male with recent episodes of complex partial seizures with secondary generalization. He had trouble tolerating higher dose of Depakote but seems to be tolerating the lower dose better Remote history of TIA 5 years ago. Chronic atrial fibrillation on eliquis    PLAN: I had a long discussion with the patient and his son regarding his recent diagnosis of seizures and need for long-term anticonvulsant therapy Patient had trouble tolerating Depakote in a higher dose and hence recommend 250 mg twice daily which he seems to be tolerating better. If he has breakthrough seizures may need to consider dose or switch to alternative medication. Have advised patient to avoid seizure provoking stimuli like sleep deprivation and medication noncompliance. He was advised to use his walker at all times for fall and safety purposes. Continue eliquis for atrial fibrillation and stroke prevention and maintain strict control of hypertension with blood pressure goal below 130/90 and lipids with LDL cholesterol goal below 70 mg percent. He'll return for follow-up in 6 months with  my nurse practitioner call earlier if necessary.Greater than 50% time during this 35 minute  visit was spent on counseling and coordination of care about his seizures, gait and balance difficulties in answering questions Antony Contras, MD  [facility] [address] [place], [place] [address]  Phone [phone] Fax [phone] Note: This document was prepared with digital dictation and possible smart phrase technology. Any transcriptional errors that result from this process are  unintentional.

## 2017-01-20 NOTE — Telephone Encounter (Signed)
Rn spoke with Pansley at [facility] at [phone]. Rn wanted to know if they receive the order form that was completed by Dr. Leonie Man. Rn stated a rx of depakote of 250mg  bid was order,and put on order form. Also he wanted patient to have labs in one week of ammonia, and valporic acid. [facility] stated they receive the order form recommendations, and rx. Pt has appt today.

## 2017-01-23 DIAGNOSIS — I495 Sick sinus syndrome: Secondary | ICD-10-CM | POA: Diagnosis not present

## 2017-01-23 DIAGNOSIS — I2510 Atherosclerotic heart disease of native coronary artery without angina pectoris: Secondary | ICD-10-CM | POA: Diagnosis not present

## 2017-01-23 DIAGNOSIS — S0101XD Laceration without foreign body of scalp, subsequent encounter: Secondary | ICD-10-CM | POA: Diagnosis not present

## 2017-01-23 DIAGNOSIS — N400 Enlarged prostate without lower urinary tract symptoms: Secondary | ICD-10-CM | POA: Diagnosis not present

## 2017-01-23 DIAGNOSIS — J449 Chronic obstructive pulmonary disease, unspecified: Secondary | ICD-10-CM | POA: Diagnosis not present

## 2017-01-23 DIAGNOSIS — I110 Hypertensive heart disease with heart failure: Secondary | ICD-10-CM | POA: Diagnosis not present

## 2017-01-23 DIAGNOSIS — F0390 Unspecified dementia without behavioral disturbance: Secondary | ICD-10-CM | POA: Diagnosis not present

## 2017-01-23 DIAGNOSIS — M1991 Primary osteoarthritis, unspecified site: Secondary | ICD-10-CM | POA: Diagnosis not present

## 2017-01-23 DIAGNOSIS — K5790 Diverticulosis of intestine, part unspecified, without perforation or abscess without bleeding: Secondary | ICD-10-CM | POA: Diagnosis not present

## 2017-01-23 DIAGNOSIS — I5033 Acute on chronic diastolic (congestive) heart failure: Secondary | ICD-10-CM | POA: Diagnosis not present

## 2017-01-23 DIAGNOSIS — K219 Gastro-esophageal reflux disease without esophagitis: Secondary | ICD-10-CM | POA: Diagnosis not present

## 2017-01-23 DIAGNOSIS — S61218D Laceration without foreign body of other finger without damage to nail, subsequent encounter: Secondary | ICD-10-CM | POA: Diagnosis not present

## 2017-01-23 DIAGNOSIS — I482 Chronic atrial fibrillation: Secondary | ICD-10-CM | POA: Diagnosis not present

## 2017-01-26 DIAGNOSIS — I110 Hypertensive heart disease with heart failure: Secondary | ICD-10-CM | POA: Diagnosis not present

## 2017-01-26 DIAGNOSIS — M1991 Primary osteoarthritis, unspecified site: Secondary | ICD-10-CM | POA: Diagnosis not present

## 2017-01-26 DIAGNOSIS — I5033 Acute on chronic diastolic (congestive) heart failure: Secondary | ICD-10-CM | POA: Diagnosis not present

## 2017-01-26 DIAGNOSIS — F0390 Unspecified dementia without behavioral disturbance: Secondary | ICD-10-CM | POA: Diagnosis not present

## 2017-01-26 DIAGNOSIS — I495 Sick sinus syndrome: Secondary | ICD-10-CM | POA: Diagnosis not present

## 2017-01-26 DIAGNOSIS — K219 Gastro-esophageal reflux disease without esophagitis: Secondary | ICD-10-CM | POA: Diagnosis not present

## 2017-01-26 DIAGNOSIS — I482 Chronic atrial fibrillation: Secondary | ICD-10-CM | POA: Diagnosis not present

## 2017-01-26 DIAGNOSIS — I2510 Atherosclerotic heart disease of native coronary artery without angina pectoris: Secondary | ICD-10-CM | POA: Diagnosis not present

## 2017-01-26 DIAGNOSIS — K5790 Diverticulosis of intestine, part unspecified, without perforation or abscess without bleeding: Secondary | ICD-10-CM | POA: Diagnosis not present

## 2017-01-26 DIAGNOSIS — N400 Enlarged prostate without lower urinary tract symptoms: Secondary | ICD-10-CM | POA: Diagnosis not present

## 2017-01-26 DIAGNOSIS — J449 Chronic obstructive pulmonary disease, unspecified: Secondary | ICD-10-CM | POA: Diagnosis not present

## 2017-01-26 DIAGNOSIS — S61218D Laceration without foreign body of other finger without damage to nail, subsequent encounter: Secondary | ICD-10-CM | POA: Diagnosis not present

## 2017-01-26 DIAGNOSIS — S0101XD Laceration without foreign body of scalp, subsequent encounter: Secondary | ICD-10-CM | POA: Diagnosis not present

## 2017-01-27 ENCOUNTER — Ambulatory Visit (HOSPITAL_COMMUNITY)
Admission: EM | Admit: 2017-01-27 | Discharge: 2017-01-27 | Disposition: A | Discharge status: Home / Self Care | Payer: PPO | Attending: Family Medicine | Admitting: Family Medicine

## 2017-01-27 ENCOUNTER — Encounter (HOSPITAL_COMMUNITY): Payer: Self-pay | Admitting: *Deleted

## 2017-01-27 DIAGNOSIS — S0083XA Contusion of other part of head, initial encounter: Secondary | ICD-10-CM

## 2017-01-27 NOTE — ED Triage Notes (Addendum)
Patient reports that he fell in the bathroom 8/31 after being placed on the wrong dose of medication. (patient had a seizure and was placed on depakote large dose). Patient lives at [facility] and is here with family at the request of the physician. Patient does not really remember the event. Large hematoma noted over left eyebrow. Bruising noted. Patient was seen in the ED for this fall.

## 2017-01-27 NOTE — ED Provider Notes (Signed)
[facility]   [id number] 01/27/17 Arrival Time: 1884  ASSESSMENT & PLAN:  1. Facial hematoma, initial encounter    Patient's son will call for ENT f/u to discuss opening hematoma. Information given. May f/u here as needed. Reviewed expectations re: course of current medical issues. Questions answered. Outlined signs and symptoms indicating need for more acute intervention. Patient verbalized understanding. After Visit Summary given.   SUBJECTIVE:  CHRLES SELLEY is a 83 y.o. male who presents with complaint of a persistent and stable hematoma above his L eye after a fall on 12/26/2016. ED notes reviewed. No pain reported. No drainage from area. Vision normal.  ROS: As per HPI.   OBJECTIVE:  Vitals:   01/27/17 1143  BP: 105/74  Pulse: 74  Resp: 17  Temp: 97.7 F (36.5 C)  TempSrc: Oral  SpO2: 95%    General appearance: alert; no distress Eyes: PERRLA; EOMI; conjunctiva normal HENT: normocephalic; atraumatic; approx 3x2 cm hematoma above L lateral eye; non-tender; no warmth Skin: warm and dry Psychological: alert and cooperative; normal mood and affect   Allergies  Allergen Reactions  . Amiodarone Nausea And Vomiting  . Bactrim [Sulfamethoxazole-Trimethoprim] Other (See Comments)    Unknown reaction. Listed on MAR  . Penicillins Hives, Swelling and Rash    Past Medical History:  Diagnosis Date  . Arthritis   . Asthma   . Barrett's esophagus 03-19-2009   EGD  . CAD (coronary artery disease)    a. s/p stent placement in 2006 to the LAD and RCA  . Cataract    REMOVED  . COPD (chronic obstructive pulmonary disease) ([place])   . Diverticulosis 03-19-2009   Colonoscopy   . Erosive esophagitis 03-19-2009   EGD  . GERD (gastroesophageal reflux disease)   . Hiatal hernia 03-19-2009   EGD  . Hyperlipidemia   . Hypertension   . Internal hemorrhoids 03-19-2009   Colonoscopy  . Paroxysmal atrial fibrillation (HCC)   . Sinus node dysfunction (HCC)    a.  s/p PPM placement in 2011   Social History   Social History  . Marital status: Married    Spouse name: N/A  . Number of children: N/A  . Years of education: N/A   Occupational History  . Retired     Social History Main Topics  . Smoking status: Former Research scientist (life sciences)  . Smokeless tobacco: Never Used     Comment: Quit smoking 1996  . Alcohol use No  . Drug use: No  . Sexual activity: Not on file   Other Topics Concern  . Not on file   Social History Narrative   Daily caffeine    Family History  Problem Relation Age of Onset  . Heart disease Mother   . Heart disease Father   . Colon cancer Neg Hx    Past Surgical History:  Procedure Laterality Date  . CARDIAC CATHETERIZATION  02/12/2009   mild ostial left main disease,patent LAD & RCA stents   . CARDIOVERSION N/A 11/06/2014   Procedure: CARDIOVERSION;  Surgeon: [name], MD;  Location: [facility] ENDOSCOPY;  Service: Cardiovascular;  Laterality: N/A;  . CARDIOVERSION N/A 08/02/2015   Procedure: CARDIOVERSION;  Surgeon: Sueanne Margarita, MD;  Location: [facility] ENDOSCOPY;  Service: Cardiovascular;  Laterality: N/A;  . CATARACT EXTRACTION    . COLONOSCOPY    . CORONARY STENT PLACEMENT  2006   Right Coronary Artery Cypher stents placed 2006  . HEMORRHOID SURGERY    . LEFT HEART CATHETERIZATION WITH CORONARY ANGIOGRAM  N/A 07/01/2013   Procedure: LEFT HEART CATHETERIZATION WITH CORONARY ANGIOGRAM;  Surgeon: Sanda Klein, MD;  Location: [facility] CATH LAB;  Service: Cardiovascular;  Laterality: N/A;  . NM MYOCAR PERF WALL MOTION  08/14/2008   no significant ischemia EF 64%  . PACEMAKER INSERTION  07/25/2009   St.Jude Accent  . TEE WITHOUT CARDIOVERSION N/A 08/02/2015   Procedure: TRANSESOPHAGEAL ECHOCARDIOGRAM (TEE);  Surgeon: Sueanne Margarita, MD;  Location: [facility] ENDOSCOPY;  Service: Cardiovascular;  Laterality: N/A;  . TONSILLECTOMY    . US ECHOCARDIOGRAPHY  07/12/2009   borderline LA enlargement,mild mitral annular ca+, AOV mildly sclerotic,trace AI.    Marland Kitchen Lady Deutscher, MD 01/27/17 1221

## 2017-01-28 DIAGNOSIS — Z0000 Encounter for general adult medical examination without abnormal findings: Secondary | ICD-10-CM | POA: Diagnosis not present

## 2017-01-29 DIAGNOSIS — K219 Gastro-esophageal reflux disease without esophagitis: Secondary | ICD-10-CM | POA: Diagnosis not present

## 2017-01-29 DIAGNOSIS — I110 Hypertensive heart disease with heart failure: Secondary | ICD-10-CM | POA: Diagnosis not present

## 2017-01-29 DIAGNOSIS — J449 Chronic obstructive pulmonary disease, unspecified: Secondary | ICD-10-CM | POA: Diagnosis not present

## 2017-01-29 DIAGNOSIS — K5790 Diverticulosis of intestine, part unspecified, without perforation or abscess without bleeding: Secondary | ICD-10-CM | POA: Diagnosis not present

## 2017-01-29 DIAGNOSIS — N400 Enlarged prostate without lower urinary tract symptoms: Secondary | ICD-10-CM | POA: Diagnosis not present

## 2017-01-29 DIAGNOSIS — R531 Weakness: Secondary | ICD-10-CM | POA: Diagnosis not present

## 2017-01-29 DIAGNOSIS — I2510 Atherosclerotic heart disease of native coronary artery without angina pectoris: Secondary | ICD-10-CM | POA: Diagnosis not present

## 2017-01-29 DIAGNOSIS — F0390 Unspecified dementia without behavioral disturbance: Secondary | ICD-10-CM | POA: Diagnosis not present

## 2017-01-29 DIAGNOSIS — S0101XD Laceration without foreign body of scalp, subsequent encounter: Secondary | ICD-10-CM | POA: Diagnosis not present

## 2017-01-29 DIAGNOSIS — I5033 Acute on chronic diastolic (congestive) heart failure: Secondary | ICD-10-CM | POA: Diagnosis not present

## 2017-01-29 DIAGNOSIS — I495 Sick sinus syndrome: Secondary | ICD-10-CM | POA: Diagnosis not present

## 2017-01-29 DIAGNOSIS — S61218D Laceration without foreign body of other finger without damage to nail, subsequent encounter: Secondary | ICD-10-CM | POA: Diagnosis not present

## 2017-01-29 DIAGNOSIS — I482 Chronic atrial fibrillation: Secondary | ICD-10-CM | POA: Diagnosis not present

## 2017-01-29 DIAGNOSIS — M1991 Primary osteoarthritis, unspecified site: Secondary | ICD-10-CM | POA: Diagnosis not present

## 2017-02-02 DIAGNOSIS — J449 Chronic obstructive pulmonary disease, unspecified: Secondary | ICD-10-CM | POA: Diagnosis not present

## 2017-02-02 DIAGNOSIS — S0101XD Laceration without foreign body of scalp, subsequent encounter: Secondary | ICD-10-CM | POA: Diagnosis not present

## 2017-02-02 DIAGNOSIS — M1991 Primary osteoarthritis, unspecified site: Secondary | ICD-10-CM | POA: Diagnosis not present

## 2017-02-02 DIAGNOSIS — I5033 Acute on chronic diastolic (congestive) heart failure: Secondary | ICD-10-CM | POA: Diagnosis not present

## 2017-02-02 DIAGNOSIS — N400 Enlarged prostate without lower urinary tract symptoms: Secondary | ICD-10-CM | POA: Diagnosis not present

## 2017-02-02 DIAGNOSIS — I495 Sick sinus syndrome: Secondary | ICD-10-CM | POA: Diagnosis not present

## 2017-02-02 DIAGNOSIS — I482 Chronic atrial fibrillation: Secondary | ICD-10-CM | POA: Diagnosis not present

## 2017-02-02 DIAGNOSIS — F0390 Unspecified dementia without behavioral disturbance: Secondary | ICD-10-CM | POA: Diagnosis not present

## 2017-02-02 DIAGNOSIS — K5790 Diverticulosis of intestine, part unspecified, without perforation or abscess without bleeding: Secondary | ICD-10-CM | POA: Diagnosis not present

## 2017-02-02 DIAGNOSIS — I110 Hypertensive heart disease with heart failure: Secondary | ICD-10-CM | POA: Diagnosis not present

## 2017-02-02 DIAGNOSIS — K219 Gastro-esophageal reflux disease without esophagitis: Secondary | ICD-10-CM | POA: Diagnosis not present

## 2017-02-02 DIAGNOSIS — I2510 Atherosclerotic heart disease of native coronary artery without angina pectoris: Secondary | ICD-10-CM | POA: Diagnosis not present

## 2017-02-02 DIAGNOSIS — S61218D Laceration without foreign body of other finger without damage to nail, subsequent encounter: Secondary | ICD-10-CM | POA: Diagnosis not present

## 2017-02-03 ENCOUNTER — Telehealth: Payer: Self-pay | Admitting: Cardiovascular Disease

## 2017-02-03 NOTE — Telephone Encounter (Signed)
New Message  Pam from [facility] called requesting to speak with RN about lab work faxed to Dr. Loletha Grayer on pt. Please call back to discuss

## 2017-02-04 DIAGNOSIS — F0390 Unspecified dementia without behavioral disturbance: Secondary | ICD-10-CM | POA: Diagnosis not present

## 2017-02-04 DIAGNOSIS — S61218D Laceration without foreign body of other finger without damage to nail, subsequent encounter: Secondary | ICD-10-CM | POA: Diagnosis not present

## 2017-02-04 DIAGNOSIS — I482 Chronic atrial fibrillation: Secondary | ICD-10-CM | POA: Diagnosis not present

## 2017-02-04 DIAGNOSIS — M1991 Primary osteoarthritis, unspecified site: Secondary | ICD-10-CM | POA: Diagnosis not present

## 2017-02-04 DIAGNOSIS — S0101XD Laceration without foreign body of scalp, subsequent encounter: Secondary | ICD-10-CM | POA: Diagnosis not present

## 2017-02-04 DIAGNOSIS — K5790 Diverticulosis of intestine, part unspecified, without perforation or abscess without bleeding: Secondary | ICD-10-CM | POA: Diagnosis not present

## 2017-02-04 DIAGNOSIS — I495 Sick sinus syndrome: Secondary | ICD-10-CM | POA: Diagnosis not present

## 2017-02-04 DIAGNOSIS — I110 Hypertensive heart disease with heart failure: Secondary | ICD-10-CM | POA: Diagnosis not present

## 2017-02-04 DIAGNOSIS — I2510 Atherosclerotic heart disease of native coronary artery without angina pectoris: Secondary | ICD-10-CM | POA: Diagnosis not present

## 2017-02-04 DIAGNOSIS — J449 Chronic obstructive pulmonary disease, unspecified: Secondary | ICD-10-CM | POA: Diagnosis not present

## 2017-02-04 DIAGNOSIS — N400 Enlarged prostate without lower urinary tract symptoms: Secondary | ICD-10-CM | POA: Diagnosis not present

## 2017-02-04 DIAGNOSIS — I5033 Acute on chronic diastolic (congestive) heart failure: Secondary | ICD-10-CM | POA: Diagnosis not present

## 2017-02-04 DIAGNOSIS — K219 Gastro-esophageal reflux disease without esophagitis: Secondary | ICD-10-CM | POA: Diagnosis not present

## 2017-02-04 NOTE — Telephone Encounter (Signed)
Received labs   Ammonia  100  Valproic Acid (Depakote)  29  Called [facility] and advised these labs were ordered by Dr Leonie Man, will fax but recommend they follow up with that office.  Faxed results to [phone]

## 2017-02-05 ENCOUNTER — Telehealth: Payer: Self-pay | Admitting: Neurology

## 2017-02-05 DIAGNOSIS — D649 Anemia, unspecified: Secondary | ICD-10-CM | POA: Diagnosis not present

## 2017-02-05 DIAGNOSIS — F445 Conversion disorder with seizures or convulsions: Secondary | ICD-10-CM | POA: Diagnosis not present

## 2017-02-05 DIAGNOSIS — S0083XA Contusion of other part of head, initial encounter: Secondary | ICD-10-CM | POA: Diagnosis not present

## 2017-02-05 DIAGNOSIS — N400 Enlarged prostate without lower urinary tract symptoms: Secondary | ICD-10-CM | POA: Diagnosis not present

## 2017-02-05 DIAGNOSIS — Z950 Presence of cardiac pacemaker: Secondary | ICD-10-CM | POA: Diagnosis not present

## 2017-02-05 DIAGNOSIS — J449 Chronic obstructive pulmonary disease, unspecified: Secondary | ICD-10-CM | POA: Diagnosis not present

## 2017-02-05 DIAGNOSIS — E785 Hyperlipidemia, unspecified: Secondary | ICD-10-CM | POA: Diagnosis not present

## 2017-02-05 DIAGNOSIS — H6120 Impacted cerumen, unspecified ear: Secondary | ICD-10-CM | POA: Diagnosis not present

## 2017-02-05 DIAGNOSIS — I5042 Chronic combined systolic (congestive) and diastolic (congestive) heart failure: Secondary | ICD-10-CM | POA: Diagnosis not present

## 2017-02-05 DIAGNOSIS — I482 Chronic atrial fibrillation: Secondary | ICD-10-CM | POA: Diagnosis not present

## 2017-02-05 DIAGNOSIS — E7220 Disorder of urea cycle metabolism, unspecified: Secondary | ICD-10-CM | POA: Diagnosis not present

## 2017-02-05 DIAGNOSIS — M6281 Muscle weakness (generalized): Secondary | ICD-10-CM | POA: Diagnosis not present

## 2017-02-05 DIAGNOSIS — I2510 Atherosclerotic heart disease of native coronary artery without angina pectoris: Secondary | ICD-10-CM | POA: Diagnosis not present

## 2017-02-05 NOTE — Telephone Encounter (Signed)
RN call Legrand Como that order form was fax to [facility] at [phone]. Rn stated on form per Dr.SEthi there is no change on depakote medication. Micheal verbalized understanding.

## 2017-02-05 NOTE — Telephone Encounter (Signed)
Michael/[facility] [phone] called, wanting to know if any directions were to be changed on pt's depakote, please check labs drawn on 10/3. Please call

## 2017-02-09 DIAGNOSIS — I5033 Acute on chronic diastolic (congestive) heart failure: Secondary | ICD-10-CM | POA: Diagnosis not present

## 2017-02-09 DIAGNOSIS — F0390 Unspecified dementia without behavioral disturbance: Secondary | ICD-10-CM | POA: Diagnosis not present

## 2017-02-09 DIAGNOSIS — I2510 Atherosclerotic heart disease of native coronary artery without angina pectoris: Secondary | ICD-10-CM | POA: Diagnosis not present

## 2017-02-09 DIAGNOSIS — K219 Gastro-esophageal reflux disease without esophagitis: Secondary | ICD-10-CM | POA: Diagnosis not present

## 2017-02-09 DIAGNOSIS — S0101XD Laceration without foreign body of scalp, subsequent encounter: Secondary | ICD-10-CM | POA: Diagnosis not present

## 2017-02-09 DIAGNOSIS — K5790 Diverticulosis of intestine, part unspecified, without perforation or abscess without bleeding: Secondary | ICD-10-CM | POA: Diagnosis not present

## 2017-02-09 DIAGNOSIS — I482 Chronic atrial fibrillation: Secondary | ICD-10-CM | POA: Diagnosis not present

## 2017-02-09 DIAGNOSIS — I495 Sick sinus syndrome: Secondary | ICD-10-CM | POA: Diagnosis not present

## 2017-02-09 DIAGNOSIS — N400 Enlarged prostate without lower urinary tract symptoms: Secondary | ICD-10-CM | POA: Diagnosis not present

## 2017-02-09 DIAGNOSIS — I110 Hypertensive heart disease with heart failure: Secondary | ICD-10-CM | POA: Diagnosis not present

## 2017-02-09 DIAGNOSIS — S61218D Laceration without foreign body of other finger without damage to nail, subsequent encounter: Secondary | ICD-10-CM | POA: Diagnosis not present

## 2017-02-09 DIAGNOSIS — M1991 Primary osteoarthritis, unspecified site: Secondary | ICD-10-CM | POA: Diagnosis not present

## 2017-02-09 DIAGNOSIS — J449 Chronic obstructive pulmonary disease, unspecified: Secondary | ICD-10-CM | POA: Diagnosis not present

## 2017-02-10 DIAGNOSIS — J449 Chronic obstructive pulmonary disease, unspecified: Secondary | ICD-10-CM | POA: Diagnosis not present

## 2017-02-16 ENCOUNTER — Ambulatory Visit: Payer: PPO | Admitting: Diagnostic Neuroimaging

## 2017-02-16 DIAGNOSIS — F331 Major depressive disorder, recurrent, moderate: Secondary | ICD-10-CM | POA: Diagnosis not present

## 2017-02-16 DIAGNOSIS — F0281 Dementia in other diseases classified elsewhere with behavioral disturbance: Secondary | ICD-10-CM | POA: Diagnosis not present

## 2017-02-16 DIAGNOSIS — G301 Alzheimer's disease with late onset: Secondary | ICD-10-CM | POA: Diagnosis not present

## 2017-02-23 ENCOUNTER — Other Ambulatory Visit: Payer: Self-pay | Admitting: Neurology

## 2017-03-03 DIAGNOSIS — E785 Hyperlipidemia, unspecified: Secondary | ICD-10-CM | POA: Diagnosis not present

## 2017-03-03 DIAGNOSIS — E7220 Disorder of urea cycle metabolism, unspecified: Secondary | ICD-10-CM | POA: Diagnosis not present

## 2017-03-03 DIAGNOSIS — I482 Chronic atrial fibrillation: Secondary | ICD-10-CM | POA: Diagnosis not present

## 2017-03-03 DIAGNOSIS — I2510 Atherosclerotic heart disease of native coronary artery without angina pectoris: Secondary | ICD-10-CM | POA: Diagnosis not present

## 2017-03-03 DIAGNOSIS — N400 Enlarged prostate without lower urinary tract symptoms: Secondary | ICD-10-CM | POA: Diagnosis not present

## 2017-03-03 DIAGNOSIS — M6281 Muscle weakness (generalized): Secondary | ICD-10-CM | POA: Diagnosis not present

## 2017-03-03 DIAGNOSIS — F445 Conversion disorder with seizures or convulsions: Secondary | ICD-10-CM | POA: Diagnosis not present

## 2017-03-03 DIAGNOSIS — J449 Chronic obstructive pulmonary disease, unspecified: Secondary | ICD-10-CM | POA: Diagnosis not present

## 2017-03-03 DIAGNOSIS — D649 Anemia, unspecified: Secondary | ICD-10-CM | POA: Diagnosis not present

## 2017-03-03 DIAGNOSIS — Z950 Presence of cardiac pacemaker: Secondary | ICD-10-CM | POA: Diagnosis not present

## 2017-03-03 DIAGNOSIS — H6120 Impacted cerumen, unspecified ear: Secondary | ICD-10-CM | POA: Diagnosis not present

## 2017-03-03 DIAGNOSIS — I5042 Chronic combined systolic (congestive) and diastolic (congestive) heart failure: Secondary | ICD-10-CM | POA: Diagnosis not present

## 2017-03-16 DIAGNOSIS — F0281 Dementia in other diseases classified elsewhere with behavioral disturbance: Secondary | ICD-10-CM | POA: Diagnosis not present

## 2017-03-16 DIAGNOSIS — F331 Major depressive disorder, recurrent, moderate: Secondary | ICD-10-CM | POA: Diagnosis not present

## 2017-03-16 DIAGNOSIS — G301 Alzheimer's disease with late onset: Secondary | ICD-10-CM | POA: Diagnosis not present

## 2017-04-10 ENCOUNTER — Ambulatory Visit: Payer: PPO | Admitting: Cardiovascular Disease

## 2017-05-08 ENCOUNTER — Observation Stay (HOSPITAL_COMMUNITY)
Admission: EM | Admit: 2017-05-08 | Discharge: 2017-05-09 | Disposition: A | Discharge status: Home / Self Care | Payer: Medicare Other | Attending: Family Medicine | Admitting: Family Medicine

## 2017-05-08 ENCOUNTER — Other Ambulatory Visit: Payer: Self-pay

## 2017-05-08 ENCOUNTER — Emergency Department (HOSPITAL_COMMUNITY): Payer: Medicare Other

## 2017-05-08 ENCOUNTER — Encounter (HOSPITAL_COMMUNITY): Payer: Self-pay

## 2017-05-08 DIAGNOSIS — Z66 Do not resuscitate: Secondary | ICD-10-CM | POA: Diagnosis not present

## 2017-05-08 DIAGNOSIS — S065XAA Traumatic subdural hemorrhage with loss of consciousness status unknown, initial encounter: Secondary | ICD-10-CM

## 2017-05-08 DIAGNOSIS — F0390 Unspecified dementia without behavioral disturbance: Secondary | ICD-10-CM | POA: Diagnosis not present

## 2017-05-08 DIAGNOSIS — F329 Major depressive disorder, single episode, unspecified: Secondary | ICD-10-CM | POA: Diagnosis not present

## 2017-05-08 DIAGNOSIS — Z87891 Personal history of nicotine dependence: Secondary | ICD-10-CM | POA: Insufficient documentation

## 2017-05-08 DIAGNOSIS — W182XXA Fall in (into) shower or empty bathtub, initial encounter: Secondary | ICD-10-CM | POA: Diagnosis not present

## 2017-05-08 DIAGNOSIS — S0083XA Contusion of other part of head, initial encounter: Secondary | ICD-10-CM | POA: Diagnosis present

## 2017-05-08 DIAGNOSIS — G4700 Insomnia, unspecified: Secondary | ICD-10-CM | POA: Insufficient documentation

## 2017-05-08 DIAGNOSIS — Z955 Presence of coronary angioplasty implant and graft: Secondary | ICD-10-CM | POA: Diagnosis not present

## 2017-05-08 DIAGNOSIS — Z950 Presence of cardiac pacemaker: Secondary | ICD-10-CM | POA: Insufficient documentation

## 2017-05-08 DIAGNOSIS — Z7901 Long term (current) use of anticoagulants: Secondary | ICD-10-CM | POA: Insufficient documentation

## 2017-05-08 DIAGNOSIS — I495 Sick sinus syndrome: Secondary | ICD-10-CM | POA: Diagnosis not present

## 2017-05-08 DIAGNOSIS — I5032 Chronic diastolic (congestive) heart failure: Secondary | ICD-10-CM | POA: Insufficient documentation

## 2017-05-08 DIAGNOSIS — I959 Hypotension, unspecified: Secondary | ICD-10-CM | POA: Insufficient documentation

## 2017-05-08 DIAGNOSIS — J449 Chronic obstructive pulmonary disease, unspecified: Secondary | ICD-10-CM | POA: Diagnosis not present

## 2017-05-08 DIAGNOSIS — N400 Enlarged prostate without lower urinary tract symptoms: Secondary | ICD-10-CM | POA: Insufficient documentation

## 2017-05-08 DIAGNOSIS — R569 Unspecified convulsions: Secondary | ICD-10-CM | POA: Diagnosis not present

## 2017-05-08 DIAGNOSIS — E785 Hyperlipidemia, unspecified: Secondary | ICD-10-CM | POA: Insufficient documentation

## 2017-05-08 DIAGNOSIS — I480 Paroxysmal atrial fibrillation: Secondary | ICD-10-CM | POA: Diagnosis not present

## 2017-05-08 DIAGNOSIS — D649 Anemia, unspecified: Secondary | ICD-10-CM | POA: Insufficient documentation

## 2017-05-08 DIAGNOSIS — I2510 Atherosclerotic heart disease of native coronary artery without angina pectoris: Secondary | ICD-10-CM | POA: Diagnosis not present

## 2017-05-08 DIAGNOSIS — Z79899 Other long term (current) drug therapy: Secondary | ICD-10-CM | POA: Insufficient documentation

## 2017-05-08 DIAGNOSIS — Z880 Allergy status to penicillin: Secondary | ICD-10-CM | POA: Diagnosis not present

## 2017-05-08 DIAGNOSIS — I10 Essential (primary) hypertension: Secondary | ICD-10-CM | POA: Diagnosis present

## 2017-05-08 DIAGNOSIS — M1990 Unspecified osteoarthritis, unspecified site: Secondary | ICD-10-CM | POA: Insufficient documentation

## 2017-05-08 DIAGNOSIS — Y93E1 Activity, personal bathing and showering: Secondary | ICD-10-CM | POA: Insufficient documentation

## 2017-05-08 DIAGNOSIS — K2270 Barrett's esophagus without dysplasia: Secondary | ICD-10-CM | POA: Diagnosis not present

## 2017-05-08 DIAGNOSIS — Z7983 Long term (current) use of bisphosphonates: Secondary | ICD-10-CM | POA: Insufficient documentation

## 2017-05-08 DIAGNOSIS — I4891 Unspecified atrial fibrillation: Secondary | ICD-10-CM | POA: Diagnosis present

## 2017-05-08 DIAGNOSIS — I110 Hypertensive heart disease with heart failure: Secondary | ICD-10-CM | POA: Diagnosis not present

## 2017-05-08 DIAGNOSIS — J45909 Unspecified asthma, uncomplicated: Secondary | ICD-10-CM | POA: Diagnosis present

## 2017-05-08 DIAGNOSIS — S065X9A Traumatic subdural hemorrhage with loss of consciousness of unspecified duration, initial encounter: Secondary | ICD-10-CM | POA: Diagnosis not present

## 2017-05-08 DIAGNOSIS — Z882 Allergy status to sulfonamides status: Secondary | ICD-10-CM | POA: Insufficient documentation

## 2017-05-08 DIAGNOSIS — I509 Heart failure, unspecified: Secondary | ICD-10-CM

## 2017-05-08 HISTORY — DX: Traumatic subdural hemorrhage with loss of consciousness status unknown, initial encounter: S06.5XAA

## 2017-05-08 HISTORY — DX: Traumatic subdural hemorrhage with loss of consciousness of unspecified duration, initial encounter: S06.5X9A

## 2017-05-08 HISTORY — DX: Unspecified dementia, unspecified severity, without behavioral disturbance, psychotic disturbance, mood disturbance, and anxiety: F03.90

## 2017-05-08 LAB — CBC WITH DIFFERENTIAL/PLATELET
BASOS PCT: 1 %
Basophils Absolute: 0 10*3/uL (ref 0.0–0.1)
EOS ABS: 0.4 10*3/uL (ref 0.0–0.7)
EOS PCT: 5 %
HCT: 28.2 % — ABNORMAL LOW (ref 39.0–52.0)
Hemoglobin: 8.9 g/dL — ABNORMAL LOW (ref 13.0–17.0)
LYMPHS ABS: 2.9 10*3/uL (ref 0.7–4.0)
Lymphocytes Relative: 35 %
MCH: 28.8 pg (ref 26.0–34.0)
MCHC: 31.6 g/dL (ref 30.0–36.0)
MCV: 91.3 fL (ref 78.0–100.0)
MONOS PCT: 19 %
Monocytes Absolute: 1.6 10*3/uL — ABNORMAL HIGH (ref 0.1–1.0)
Neutro Abs: 3.3 10*3/uL (ref 1.7–7.7)
Neutrophils Relative %: 40 %
PLATELETS: 255 10*3/uL (ref 150–400)
RBC: 3.09 MIL/uL — AB (ref 4.22–5.81)
RDW: 18 % — ABNORMAL HIGH (ref 11.5–15.5)
WBC: 8.3 10*3/uL (ref 4.0–10.5)

## 2017-05-08 LAB — URINALYSIS, ROUTINE W REFLEX MICROSCOPIC
BILIRUBIN URINE: NEGATIVE
GLUCOSE, UA: NEGATIVE mg/dL
HGB URINE DIPSTICK: NEGATIVE
KETONES UR: NEGATIVE mg/dL
Leukocytes, UA: NEGATIVE
Nitrite: NEGATIVE
PROTEIN: NEGATIVE mg/dL
Specific Gravity, Urine: 1.02 (ref 1.005–1.030)
pH: 6 (ref 5.0–8.0)

## 2017-05-08 LAB — POC OCCULT BLOOD, ED: Fecal Occult Bld: NEGATIVE

## 2017-05-08 LAB — BASIC METABOLIC PANEL
Anion gap: 5 (ref 5–15)
BUN: 20 mg/dL (ref 6–20)
CALCIUM: 8.2 mg/dL — AB (ref 8.9–10.3)
CHLORIDE: 102 mmol/L (ref 101–111)
CO2: 28 mmol/L (ref 22–32)
CREATININE: 0.59 mg/dL — AB (ref 0.61–1.24)
GFR calc non Af Amer: >60 mL/min (ref >60)
Glucose, Bld: 105 mg/dL — ABNORMAL HIGH (ref 65–99)
Potassium: 4 mmol/L (ref 3.5–5.1)
SODIUM: 135 mmol/L (ref 135–145)

## 2017-05-08 LAB — PROTIME-INR
INR: 1.29
Prothrombin Time: 16 seconds — ABNORMAL HIGH (ref 11.4–15.2)

## 2017-05-08 LAB — TYPE AND SCREEN
ABO/RH(D): A NEG
Antibody Screen: NEGATIVE

## 2017-05-08 LAB — ABO/RH: ABO/RH(D): A NEG

## 2017-05-08 LAB — VALPROIC ACID LEVEL: VALPROIC ACID LVL: 29 ug/mL — AB (ref 50.0–100.0)

## 2017-05-08 LAB — MRSA PCR SCREENING: MRSA BY PCR: NEGATIVE

## 2017-05-08 MED ORDER — MOMETASONE FURO-FORMOTEROL FUM 200-5 MCG/ACT IN AERO
2.0000 | INHALATION_SPRAY | Freq: Two times a day (BID) | RESPIRATORY_TRACT | Status: DC
  Administered 2017-05-08: 2 via RESPIRATORY_TRACT
  Filled 2017-05-08: qty 8.8

## 2017-05-08 MED ORDER — DIVALPROEX SODIUM 250 MG PO DR TAB
250.0000 mg | DELAYED_RELEASE_TABLET | Freq: Two times a day (BID) | ORAL | Status: DC
  Administered 2017-05-08 – 2017-05-09 (×3): 250 mg via ORAL
  Filled 2017-05-08 (×4): qty 1

## 2017-05-08 MED ORDER — ADULT MULTIVITAMIN W/MINERALS CH
1.0000 | ORAL_TABLET | Freq: Every day | ORAL | Status: DC
  Administered 2017-05-09: 1 via ORAL
  Filled 2017-05-08: qty 1

## 2017-05-08 MED ORDER — LACTULOSE 10 GM/15ML PO SOLN
20.0000 g | Freq: Two times a day (BID) | ORAL | Status: DC
  Administered 2017-05-08 – 2017-05-09 (×2): 20 g via ORAL
  Filled 2017-05-08 (×4): qty 30

## 2017-05-08 MED ORDER — MULTI-VITAMIN/MINERALS PO TABS: 1.0000 | ORAL_TABLET | Freq: Every day | ORAL | Status: DC

## 2017-05-08 MED ORDER — HYDROCODONE-ACETAMINOPHEN 5-325 MG PO TABS
1.0000 | ORAL_TABLET | ORAL | Status: DC | PRN
  Administered 2017-05-08: 1 via ORAL
  Filled 2017-05-08: qty 1

## 2017-05-08 MED ORDER — ROSUVASTATIN CALCIUM 10 MG PO TABS
10.0000 mg | ORAL_TABLET | Freq: Every day | ORAL | Status: DC
  Administered 2017-05-08 – 2017-05-09 (×2): 10 mg via ORAL
  Filled 2017-05-08 (×2): qty 1

## 2017-05-08 MED ORDER — SERTRALINE HCL 100 MG PO TABS
100.0000 mg | ORAL_TABLET | Freq: Every day | ORAL | Status: DC
  Administered 2017-05-08 – 2017-05-09 (×2): 100 mg via ORAL
  Filled 2017-05-08 (×2): qty 1

## 2017-05-08 MED ORDER — TEMAZEPAM 15 MG PO CAPS: 15.0000 mg | ORAL_CAPSULE | Freq: Every day | ORAL | Status: DC

## 2017-05-08 MED ORDER — PROTHROMBIN COMPLEX CONC HUMAN 500 UNITS IV KIT
4072.0000 [IU] | PACK | Freq: Once | INTRAVENOUS | Status: AC
  Administered 2017-05-08: 4072 [IU] via INTRAVENOUS
  Filled 2017-05-08: qty 4072

## 2017-05-08 MED ORDER — BISACODYL 10 MG RE SUPP: 10.0000 mg | Freq: Every day | RECTAL | Status: DC | PRN

## 2017-05-08 MED ORDER — TETANUS-DIPHTH-ACELL PERTUSSIS 5-2.5-18.5 LF-MCG/0.5 IM SUSP
0.5000 mL | Freq: Once | INTRAMUSCULAR | Status: AC
  Administered 2017-05-08: 0.5 mL via INTRAMUSCULAR
  Filled 2017-05-08: qty 0.5

## 2017-05-08 MED ORDER — ACETAMINOPHEN 325 MG PO TABS: 650.0000 mg | ORAL_TABLET | Freq: Four times a day (QID) | ORAL | Status: DC | PRN

## 2017-05-08 MED ORDER — ALBUTEROL SULFATE (2.5 MG/3ML) 0.083% IN NEBU: 2.5000 mg | INHALATION_SOLUTION | Freq: Three times a day (TID) | RESPIRATORY_TRACT | Status: DC | PRN

## 2017-05-08 MED ORDER — SENNOSIDES-DOCUSATE SODIUM 8.6-50 MG PO TABS: 1.0000 | ORAL_TABLET | Freq: Every evening | ORAL | Status: DC | PRN

## 2017-05-08 MED ORDER — SODIUM CHLORIDE 0.9 % IV SOLN
INTRAVENOUS | Status: DC
  Administered 2017-05-08: 22:00:00 via INTRAVENOUS

## 2017-05-08 MED ORDER — POTASSIUM CHLORIDE CRYS ER 20 MEQ PO TBCR
20.0000 meq | EXTENDED_RELEASE_TABLET | Freq: Every day | ORAL | Status: DC
  Administered 2017-05-08 – 2017-05-09 (×2): 20 meq via ORAL
  Filled 2017-05-08 (×2): qty 1

## 2017-05-08 MED ORDER — FINASTERIDE 5 MG PO TABS
5.0000 mg | ORAL_TABLET | Freq: Every day | ORAL | Status: DC
  Administered 2017-05-08 – 2017-05-09 (×2): 5 mg via ORAL
  Filled 2017-05-08 (×2): qty 1

## 2017-05-08 MED ORDER — ONDANSETRON HCL 4 MG/2ML IJ SOLN: 4.0000 mg | Freq: Four times a day (QID) | INTRAMUSCULAR | Status: DC | PRN

## 2017-05-08 MED ORDER — SODIUM CHLORIDE 0.9 % IV BOLUS (SEPSIS)
1000.0000 mL | Freq: Once | INTRAVENOUS | Status: AC
  Administered 2017-05-08: 1000 mL via INTRAVENOUS

## 2017-05-08 MED ORDER — TEMAZEPAM 7.5 MG PO CAPS
15.0000 mg | ORAL_CAPSULE | Freq: Every day | ORAL | Status: DC
  Administered 2017-05-08: 15 mg via ORAL
  Filled 2017-05-08: qty 2

## 2017-05-08 MED ORDER — ONDANSETRON HCL 4 MG PO TABS
4.0000 mg | ORAL_TABLET | Freq: Four times a day (QID) | ORAL | Status: DC | PRN
Start: 2017-05-08 — End: 2017-05-09

## 2017-05-08 MED ORDER — TAMSULOSIN HCL 0.4 MG PO CAPS
0.4000 mg | ORAL_CAPSULE | Freq: Every day | ORAL | Status: DC
  Administered 2017-05-08 – 2017-05-09 (×2): 0.4 mg via ORAL
  Filled 2017-05-08 (×2): qty 1

## 2017-05-08 MED ORDER — ALBUTEROL SULFATE 0.63 MG/3ML IN NEBU
0.6300 mg | INHALATION_SOLUTION | Freq: Three times a day (TID) | RESPIRATORY_TRACT | Status: DC | PRN
Start: 2017-05-08 — End: 2017-05-08

## 2017-05-08 MED ORDER — ACETAMINOPHEN 650 MG RE SUPP: 650.0000 mg | Freq: Four times a day (QID) | RECTAL | Status: DC | PRN

## 2017-05-08 NOTE — Consult Note (Signed)
Reason for Consult: Left subdural hematoma Referring Physician: Dr. Karsten Fells is an 84 y.o. male.  HPI: The patient is an 84 year old white male who is on Eliquis for atrial fibrillation who fell.  He was brought to the ER.  The head scan demonstrated a small left subdural hematoma.  The patient is being admitted to the hospitalist service for observation.  A neurosurgical consultation was requested.  Recently the patient is alert and pleasant and in no apparent distress.  He denies pain, headache, neck pain, etc.  Past Medical History:  Diagnosis Date  . Arthritis   . Asthma   . Barrett's esophagus 03-19-2009   EGD  . CAD (coronary artery disease)    a. s/p stent placement in 2006 to the LAD and RCA  . Cataract    REMOVED  . COPD (chronic obstructive pulmonary disease) ([place])   . Diverticulosis 03-19-2009   Colonoscopy   . Erosive esophagitis 03-19-2009   EGD  . GERD (gastroesophageal reflux disease)   . Hiatal hernia 03-19-2009   EGD  . Hyperlipidemia   . Hypertension   . Internal hemorrhoids 03-19-2009   Colonoscopy  . Paroxysmal atrial fibrillation (HCC)   . Sinus node dysfunction (HCC)    a. s/p PPM placement in 2011    Past Surgical History:  Procedure Laterality Date  . CARDIAC CATHETERIZATION  02/12/2009   mild ostial left main disease,patent LAD & RCA stents   . CARDIOVERSION N/A 11/06/2014   Procedure: CARDIOVERSION;  Surgeon: [name], MD;  Location: [facility] ENDOSCOPY;  Service: Cardiovascular;  Laterality: N/A;  . CARDIOVERSION N/A 08/02/2015   Procedure: CARDIOVERSION;  Surgeon: Sueanne Margarita, MD;  Location: [facility] ENDOSCOPY;  Service: Cardiovascular;  Laterality: N/A;  . CATARACT EXTRACTION    . COLONOSCOPY    . CORONARY STENT PLACEMENT  2006   Right Coronary Artery Cypher stents placed 2006  . HEMORRHOID SURGERY    . LEFT HEART CATHETERIZATION WITH CORONARY ANGIOGRAM N/A 07/01/2013   Procedure: LEFT HEART CATHETERIZATION WITH CORONARY  ANGIOGRAM;  Surgeon: Sanda Klein, MD;  Location: [facility] CATH LAB;  Service: Cardiovascular;  Laterality: N/A;  . NM MYOCAR PERF WALL MOTION  08/14/2008   no significant ischemia EF 64%  . PACEMAKER INSERTION  07/25/2009   [employer].Jude Accent  . TEE WITHOUT CARDIOVERSION N/A 08/02/2015   Procedure: TRANSESOPHAGEAL ECHOCARDIOGRAM (TEE);  Surgeon: Sueanne Margarita, MD;  Location: [facility] ENDOSCOPY;  Service: Cardiovascular;  Laterality: N/A;  . TONSILLECTOMY    . US ECHOCARDIOGRAPHY  07/12/2009   borderline LA enlargement,mild mitral annular ca+, AOV mildly sclerotic,trace AI.  Marland Kitchen VASECTOMY      Family History  Problem Relation Age of Onset  . Heart disease Mother   . Heart disease Father   . Colon cancer Neg Hx     Social History:  reports that he has quit smoking. he has never used smokeless tobacco. He reports that he does not drink alcohol or use drugs.  Allergies:  Allergies  Allergen Reactions  . Amiodarone Nausea And Vomiting  . Bactrim [Sulfamethoxazole-Trimethoprim] Other (See Comments)    Unknown reaction. Listed on MAR  . Penicillins Hives, Swelling and Rash    Medications:  I have reviewed the patient's current medications. Prior to Admission:  (Not in a hospital admission) Scheduled: Continuous: PRN: Anti-infectives (From admission, onward)   None       Results for orders placed or performed during the hospital encounter of 05/08/17 (from the past 48 hour(s))  Basic metabolic panel     Status: Abnormal   Collection Time: 05/08/17  3:20 AM  Result Value Ref Range   Sodium 135 135 - 145 mmol/L   Potassium 4.0 3.5 - 5.1 mmol/L   Chloride 102 101 - 111 mmol/L   CO2 28 22 - 32 mmol/L   Glucose, Bld 105 (H) 65 - 99 mg/dL   BUN 20 6 - 20 mg/dL   Creatinine, Ser 0.59 (L) 0.61 - 1.24 mg/dL   Calcium 8.2 (L) 8.9 - 10.3 mg/dL   GFR calc non Af Amer >60 >60 mL/min   GFR calc Af Amer >60 >60 mL/min    Comment: (NOTE) The eGFR has been calculated using the CKD EPI equation. This  calculation has not been validated in all clinical situations. eGFR's persistently <60 mL/min signify possible Chronic Kidney Disease.    Anion gap 5 5 - 15  CBC with Differential/Platelet     Status: Abnormal   Collection Time: 05/08/17  3:20 AM  Result Value Ref Range   WBC 8.3 4.0 - 10.5 K/uL   RBC 3.09 (L) 4.22 - 5.81 MIL/uL   Hemoglobin 8.9 (L) 13.0 - 17.0 g/dL   HCT 28.2 (L) 39.0 - 52.0 %   MCV 91.3 78.0 - 100.0 fL   MCH 28.8 26.0 - 34.0 pg   MCHC 31.6 30.0 - 36.0 g/dL   RDW 18.0 (H) 11.5 - 15.5 %   Platelets 255 150 - 400 K/uL   Neutrophils Relative % 40 %   Neutro Abs 3.3 1.7 - 7.7 K/uL   Lymphocytes Relative 35 %   Lymphs Abs 2.9 0.7 - 4.0 K/uL   Monocytes Relative 19 %   Monocytes Absolute 1.6 (H) 0.1 - 1.0 K/uL   Eosinophils Relative 5 %   Eosinophils Absolute 0.4 0.0 - 0.7 K/uL   Basophils Relative 1 %   Basophils Absolute 0.0 0.0 - 0.1 K/uL  Valproic acid level     Status: Abnormal   Collection Time: 05/08/17  3:20 AM  Result Value Ref Range   Valproic Acid Lvl 29 (L) 50.0 - 100.0 ug/mL  Type and screen     Status: None   Collection Time: 05/08/17  4:37 AM  Result Value Ref Range   ABO/RH(D) A NEG    Antibody Screen NEG    Sample Expiration 05/11/2017   Protime-INR     Status: Abnormal   Collection Time: 05/08/17  4:37 AM  Result Value Ref Range   Prothrombin Time 16.0 (H) 11.4 - 15.2 seconds   INR 1.29   ABO/Rh     Status: None   Collection Time: 05/08/17  4:37 AM  Result Value Ref Range   ABO/RH(D) A NEG   POC occult blood, ED     Status: None   Collection Time: 05/08/17  5:09 AM  Result Value Ref Range   Fecal Occult Bld NEGATIVE NEGATIVE  Urinalysis, Routine w reflex microscopic     Status: None   Collection Time: 05/08/17  6:28 AM  Result Value Ref Range   Color, Urine YELLOW YELLOW   APPearance CLEAR CLEAR   Specific Gravity, Urine 1.020 1.005 - 1.030   pH 6.0 5.0 - 8.0   Glucose, UA NEGATIVE NEGATIVE mg/dL   Hgb urine dipstick NEGATIVE  NEGATIVE   Bilirubin Urine NEGATIVE NEGATIVE   Ketones, ur NEGATIVE NEGATIVE mg/dL   Protein, ur NEGATIVE NEGATIVE mg/dL   Nitrite NEGATIVE NEGATIVE   Leukocytes, UA NEGATIVE NEGATIVE  Dg Chest 2 View  Result Date: 05/08/2017 CLINICAL DATA:  84 year old male with fall. EXAM: CHEST  2 VIEW COMPARISON:  Chest radiograph dated 12/23/2016 FINDINGS: There is diffuse interstitial coarsening, chronic. No focal consolidation, pleural effusion, or pneumothorax. Stable borderline enlarged cardiac silhouette. There is atherosclerotic calcification of the aortic arch. Left pectoral pacemaker device there is osteopenia with degenerative changes of the spine. No acute osseous pathology IMPRESSION: No acute cardiopulmonary process.  No interval change. Electronically Signed   By: Anner Crete M.D.   On: 05/08/2017 04:13   Ct Head Wo Contrast  Result Date: 05/08/2017 CLINICAL DATA:  Fall EXAM: CT HEAD WITHOUT CONTRAST CT MAXILLOFACIAL WITHOUT CONTRAST TECHNIQUE: Multidetector CT imaging of the head and maxillofacial structures were performed using the standard protocol without intravenous contrast. Multiplanar CT image reconstructions of the maxillofacial structures were also generated. COMPARISON:  None. FINDINGS: CT HEAD FINDINGS Brain: There is a small left convexity acute subdural hematoma that measures 5 mm in thickness. There is no associated mass effect. There is also subdural blood layering along the left leaflet of the tentorium cerebelli. No intraparenchymal hemorrhage. No hydrocephalus. There is periventricular hypoattenuation compatible with chronic microvascular disease. Vascular: No hyperdense vessel or unexpected calcification. Skull: No calvarial fracture. Normal skull base. CT MAXILLOFACIAL FINDINGS Osseous: --Complex facial fracture types: No LeFort, zygomaticomaxillary complex or nasoorbitoethmoidal fracture. --Simple fracture types: None. --Mandible, hard palate and teeth: No acute  abnormality. Orbits: The globes are intact. Normal appearance of the intra- and extraconal fat. Symmetric extraocular muscles. Sinuses: Left mastoid and middle ear effusion. Small amount of right mastoid fluid. No paranasal sinus fluid levels or hemosinus. Soft tissues: Right facial hematoma. IMPRESSION: 1. Small left convexity and left tentorial subdural hematoma measuring 5 mm in thickness without associated midline shift or other mass effect. 2. No skull or facial fracture. 3. Small right facial hematoma. 4. Left mastoid and middle ear effusion. No associated temporal bone fracture is visible, but clinical/otoscopic assessment for hemotympanum or mastoid tenderness is suggested. Critical Value/emergent results were called by telephone at the time of interpretation on 05/08/2017 at 4:12 am to Dr. Ripley Fraise , who verbally acknowledged these results. Electronically Signed   By: Ulyses Jarred M.D.   On: 05/08/2017 04:16   Ct Maxillofacial Wo Contrast  Result Date: 05/08/2017 CLINICAL DATA:  Fall EXAM: CT HEAD WITHOUT CONTRAST CT MAXILLOFACIAL WITHOUT CONTRAST TECHNIQUE: Multidetector CT imaging of the head and maxillofacial structures were performed using the standard protocol without intravenous contrast. Multiplanar CT image reconstructions of the maxillofacial structures were also generated. COMPARISON:  None. FINDINGS: CT HEAD FINDINGS Brain: There is a small left convexity acute subdural hematoma that measures 5 mm in thickness. There is no associated mass effect. There is also subdural blood layering along the left leaflet of the tentorium cerebelli. No intraparenchymal hemorrhage. No hydrocephalus. There is periventricular hypoattenuation compatible with chronic microvascular disease. Vascular: No hyperdense vessel or unexpected calcification. Skull: No calvarial fracture. Normal skull base. CT MAXILLOFACIAL FINDINGS Osseous: --Complex facial fracture types: No LeFort, zygomaticomaxillary complex or  nasoorbitoethmoidal fracture. --Simple fracture types: None. --Mandible, hard palate and teeth: No acute abnormality. Orbits: The globes are intact. Normal appearance of the intra- and extraconal fat. Symmetric extraocular muscles. Sinuses: Left mastoid and middle ear effusion. Small amount of right mastoid fluid. No paranasal sinus fluid levels or hemosinus. Soft tissues: Right facial hematoma. IMPRESSION: 1. Small left convexity and left tentorial subdural hematoma measuring 5 mm in thickness without associated midline shift or other mass  effect. 2. No skull or facial fracture. 3. Small right facial hematoma. 4. Left mastoid and middle ear effusion. No associated temporal bone fracture is visible, but clinical/otoscopic assessment for hemotympanum or mastoid tenderness is suggested. Critical Value/emergent results were called by telephone at the time of interpretation on 05/08/2017 at 4:12 am to Dr. Ripley Fraise , who verbally acknowledged these results. Electronically Signed   By: Ulyses Jarred M.D.   On: 05/08/2017 04:16    ROS: As above Blood pressure (!) 96/57, pulse 60, temperature (!) 97.3 F (36.3 C), temperature source Oral, resp. rate 15, height _0  (1.778 m), weight 81.6 kg (180 lb), SpO2 97 %. Physical Exam  General: An alert and pleasant 84 year old white male in no apparent distress.  HEENT: Normocephalic, he has some right facial bruising, his pupils are equal.  Extraocular muscles are intact.  I do not see any evidence of CSF otorrhea or rhinorrhea, battle signs, etc.  Neck: Supple with a normal range of motion for his age.  Thorax: Symmetric  Abdomen: Soft  Extremities: Unremarkable  Neurologic exam: The patient is alert and oriented x3.  Glasgow Coma Scale 15.  Cranial nerves II through XII are examined bilaterally grossly normal.  Vision and hearing are grossly normal except for some some presbycusis.  The patient's motor strength is normal in his bilateral bicep, tricep,  quadricep, gastric nevus, dorsiflexors and left hand grip.  He has some chronic slight weakness in his right hand.  Cerebellar function is intact to rapid alternating movements in the upper extremities bilaterally.  Sensory function is intact to light touch sensation all tested dermatomes bilaterally.  Assessment/Plan: Left subdural hematoma, Eliquis anticoagulation: The patient is normal clinically.  His hematoma is small.  He is going to be admitted for observation and we will plan to repeat his CAT scan tomorrow and likely discharge him if his CAT scan is stable.  He should remain off his Eliquis for at least a week and contact his primary doctor to see if they want to restart his anticoagulation.  Steve Norris 05/08/2017, 7:47 AM

## 2017-05-08 NOTE — Clinical Social Work Note (Signed)
Clinical Social Work Assessment  Patient Details  Name: Steve Norris MRN: [id number] Date of Birth: [birth year]  Date of referral:  05/08/17               Reason for consult:  Facility Placement                Permission sought to share information with:    Permission granted to share information::     Name::        Agency::     Relationship::     Contact Information:     Housing/Transportation Living arrangements for the past 2 months:  Independent Living Facility([facility]) Source of Information:  Patient Patient Interpreter Needed:  None Criminal Activity/Legal Involvement Pertinent to Current Situation/Hospitalization:  No - Comment as needed Significant Relationships:  Adult Children, Other(Comment)(staff at [facility] [employer] Lives with:  Self Do you feel safe going back to the place where you live?  Yes Need for family participation in patient care:  Yes (Comment)  Care giving concerns:  CSW spoke with pt at the bedside. At this time pt expressed no concerns to CSW. CSW did speak with pt about the potential need of SNF is pt is needing more help at the time of discharge.    Social Worker assessment / plan:  CSW spoke with pt at bedside. During this time CSW was informed that pt is from [facility] and is hoping to return to this facility at the time of discharge. Pt was unsure as to how long pt has been at this facility but states "only a few months". CSW was informed that pt has support from children, however children do not live here in the [place] area, but do come and visit with pt often per pt report. CSW spoke with pt about SNF if needed and pt is agreeable to SNF but wants it at [facility] if possible.   Employment status:  Retired Forensic scientist:  Other (Comment Required)([employer] ) PT Recommendations:  Not assessed at this time Information / Referral to community resources:     Patient/Family's Response to care:  Pt appeared to be  understanding and agreeable to plan of care at this time. Pt reports that pt lives Independently and is usually able to care for self.   Patient/Family's Understanding of and Emotional Response to Diagnosis, Current Treatment, and Prognosis:  At this time no further questions or concerns have been presented to CSW. Pt presented as calm and very understanding of needs that may arise throughout admission.   Emotional Assessment Appearance:  Appears stated age Attitude/Demeanor/Rapport:    Affect (typically observed):  Appropriate, Pleasant, Calm Orientation:  Oriented to Situation, Oriented to Self, Oriented to Place, Oriented to  Time Alcohol / Substance use:  Not Applicable Psych involvement (Current and /or in the community):  No (Comment)  Discharge Needs  Concerns to be addressed:  Care Coordination Readmission within the last 30 days:  No Current discharge risk:  None Barriers to Discharge:  No Barriers Identified   Wetzel Bjornstad, [place] 05/08/2017, 7:53 AM

## 2017-05-08 NOTE — H&P (Signed)
History and Physical    Steve Norris:[id number] DOB: [birth year] DOA: 05/08/2017   PCP: [facility]   Patient coming from: Nursing home, [facility]  Chief Complaint: Fall in the shower  HPI: Steve Norris is a 84 y.o. male with medical history significant for COPD, CHF, history of stable hematoma above the left eye after a fall on 12/26/2016, history of Barrett's esophagus, erosive esophagitis in  2010, with last colonoscopy in the year,  CAD status post stent in 2006 with last cardiac catheterization in 2015, history of paroxysmal atrial fibrillation status post cardioversion in 2016 and in 2017 followed by Dr. Sallyanne Kuster, currently on Eliquis, sinus node dysfunction, status post pacemaker placement in 2011, hyperlipidemia, hypertension, brought from the nursing home after sustaining another fall.  Apparently he was attempting to use the restroom, when he fell in the shower because of losing balance.  He is unsure if he lost consciousness.  After the fall, the patient complained of a headache, without any vision changes.  No seizures were reported. He denies any shortness of breath, or chest pain or palpitations.  He denies any abdominal pain, nausea or vomiting.  He denies any other areas of bleeding he is very weak, and unable to ambulate at this time due to debilitation.   ED Course:  BP (!) 96/57   Pulse 60   Temp (!) 97.3 F (36.3 C) (Oral)   Resp 15   Ht 5\' 10"  (1.778 m)   Wt 81.6 kg (180 lb)   SpO2 97%   BMI 25.83 kg/m   He was noted to be mildly hypotensive, requiring IV fluids on transport around 3 300 cc, and here. Glucose 105 Creatinine 0.59 Calcium 8.2 Hemoglobin 8.9 Valproic acid level 29 PT 16 Urinalysis negative Hemoccult negative CT of the head showed small subdural hematoma.  EDP spoke with Dr. Arnoldo Morale with neurosurgery, to review the CT of the head, who recommended K Centra rapid reversal of Eliquis by pharmacy, due to high possibility of  worsening subdural hemorrhage, as Eliquis has not cleared yet.  K Centra was ordered at 5:53 AM. At this time, no signs of acute GI bleed were seen EKG   Sinus rhythm Ventricular premature complex IVCD, consider atypical RBBB  Review of Systems:  As per HPI otherwise all other systems reviewed and are negative  Past Medical History:  Diagnosis Date  . Arthritis   . Asthma   . Barrett's esophagus 03-19-2009   EGD  . CAD (coronary artery disease)    a. s/p stent placement in 2006 to the LAD and RCA  . Cataract    REMOVED  . COPD (chronic obstructive pulmonary disease) ([place])   . Diverticulosis 03-19-2009   Colonoscopy   . Erosive esophagitis 03-19-2009   EGD  . GERD (gastroesophageal reflux disease)   . Hiatal hernia 03-19-2009   EGD  . Hyperlipidemia   . Hypertension   . Internal hemorrhoids 03-19-2009   Colonoscopy  . Paroxysmal atrial fibrillation (HCC)   . Sinus node dysfunction (HCC)    a. s/p PPM placement in 2011    Past Surgical History:  Procedure Laterality Date  . CARDIAC CATHETERIZATION  02/12/2009   mild ostial left main disease,patent LAD & RCA stents   . CARDIOVERSION N/A 11/06/2014   Procedure: CARDIOVERSION;  Surgeon: [name], MD;  Location: [facility] ENDOSCOPY;  Service: Cardiovascular;  Laterality: N/A;  . CARDIOVERSION N/A 08/02/2015   Procedure: CARDIOVERSION;  Surgeon: Sueanne Margarita, MD;  Location: [facility] ENDOSCOPY;  Service: Cardiovascular;  Laterality: N/A;  . CATARACT EXTRACTION    . COLONOSCOPY    . CORONARY STENT PLACEMENT  2006   Right Coronary Artery Cypher stents placed 2006  . HEMORRHOID SURGERY    . LEFT HEART CATHETERIZATION WITH CORONARY ANGIOGRAM N/A 07/01/2013   Procedure: LEFT HEART CATHETERIZATION WITH CORONARY ANGIOGRAM;  Surgeon: Sanda Klein, MD;  Location: [place] CATH LAB;  Service: Cardiovascular;  Laterality: N/A;  . NM MYOCAR PERF WALL MOTION  08/14/2008   no significant ischemia EF 64%  . PACEMAKER INSERTION  07/25/2009   [facility]  [facility]  . TEE WITHOUT CARDIOVERSION N/A 08/02/2015   Procedure: TRANSESOPHAGEAL ECHOCARDIOGRAM (TEE);  Surgeon: Sueanne Margarita, MD;  Location: [facility] ENDOSCOPY;  Service: Cardiovascular;  Laterality: N/A;  . TONSILLECTOMY    . US ECHOCARDIOGRAPHY  07/12/2009   borderline LA enlargement,mild mitral annular ca+, AOV mildly sclerotic,trace AI.  Marland Kitchen VASECTOMY      Social History Social History   Socioeconomic History  . Marital status: Married    Spouse name: Not on file  . Number of children: Not on file  . Years of education: Not on file  . Highest education level: Not on file  Social Needs  . Financial resource strain: Not on file  . Food insecurity - worry: Not on file  . Food insecurity - inability: Not on file  . Transportation needs - medical: Not on file  . Transportation needs - non-medical: Not on file  Occupational History  . Occupation: Retired   Tobacco Use  . Smoking status: Former Research scientist (life sciences)  . Smokeless tobacco: Never Used  . Tobacco comment: Quit smoking 1996  Substance and Sexual Activity  . Alcohol use: No  . Drug use: No  . Sexual activity: Not on file  Other Topics Concern  . Not on file  Social History Narrative   Daily caffeine      Allergies  Allergen Reactions  . Amiodarone Nausea And Vomiting  . Bactrim [Sulfamethoxazole-Trimethoprim] Other (See Comments)    Unknown reaction. Listed on MAR  . Penicillins Hives, Swelling and Rash    Family History  Problem Relation Age of Onset  . Heart disease Mother   . Heart disease Father   . Colon cancer Neg Hx       Prior to Admission medications   Medication Sig Start Date End Date Taking? Authorizing Provider  acetaminophen (TYLENOL) 500 MG tablet Take 1 tablet (500 mg total) by mouth every 6 (six) hours as needed. Patient taking differently: Take 1,000 mg by mouth at bedtime.  12/27/16  Yes Domenic Moras, PA-C  acetaminophen (TYLENOL) 500 MG tablet Take 500 mg by mouth every 6 (six) hours as needed for fever.    Yes [provider]  albuterol (ACCUNEB) 0.63 MG/3ML nebulizer solution Take 1 ampule by nebulization every 8 (eight) hours as needed for wheezing.   Yes [provider]  apixaban (ELIQUIS) 5 MG TABS tablet Take 5 mg by mouth 2 (two) times daily.   Yes [provider]  divalproex (DEPAKOTE) 250 MG DR tablet TAKE (1) TABLET BY MOUTH TWICE DAILY. 02/23/17  Yes Garvin Fila, MD  finasteride (PROSCAR) 5 MG tablet Take 5 mg by mouth daily.  02/17/12  Yes [provider]  Fluticasone-Salmeterol (ADVAIR) 250-50 MCG/DOSE AEPB Inhale 1 puff into the lungs 2 (two) times daily.   Yes [provider]  furosemide (LASIX) 20 MG tablet Take 1 tablet (20 mg total) by mouth daily.  01/10/16  Yes Croitoru, Mihai, MD  lactulose (CHRONULAC) 10 GM/15ML solution Take 20 g by mouth 2 (two) times daily.   Yes [provider]  mineral oil liquid Place 0.05 mLs in ear(s) once a week. On Thursday   Yes [provider]  Multiple Vitamins-Minerals (MULTIVITAMIN WITH MINERALS) tablet Take 1 tablet by mouth daily.   Yes [provider]  nitroGLYCERIN (NITROSTAT) 0.4 MG SL tablet Place 0.4 mg under the tongue every 5 (five) minutes as needed for chest pain.    Yes [provider]  potassium chloride SA (K-DUR,KLOR-CON) 20 MEQ tablet take 1 tablet by mouth once daily Patient taking differently: 20 mEq by mouth daily 12/18/15  Yes Croitoru, Mihai, MD  rosuvastatin (CRESTOR) 10 MG tablet Take 10 mg by mouth daily.   Yes [provider]  senna-docusate (SENNA-S) 8.6-50 MG per tablet Take 1 tablet by mouth as needed for mild constipation.    Yes [provider]  sertraline (ZOLOFT) 100 MG tablet Take 100 mg by mouth daily.   Yes [provider]  sotalol (BETAPACE) 80 MG tablet take 1 tablet by mouth every 12 hours Patient taking differently: 80 mg oral twice daily 02/11/16  Yes Croitoru, Mihai, MD  tamsulosin (FLOMAX) 0.4 MG  CAPS capsule Take 1 capsule by mouth daily. 12/27/15  Yes [provider]  temazepam (RESTORIL) 15 MG capsule Take 15 mg by mouth at bedtime.   Yes [provider]    Physical Exam:  Vitals:   05/08/17 0530 05/08/17 0600 05/08/17 0630 05/08/17 0700  BP: 100/64 114/67 127/72 (!) 96/57  Pulse: 64 70 61 60  Resp: 16 19 18 15   Temp:      TempSrc:      SpO2: 91% 98% 97% 97%  Weight:      Height:       Constitutional: NAD, calm, comfortable, several bruises seen Eyes: PERRL, conjunctivae normal.  Right bleed with bruising, but without any open areas. ENMT: Mucous membranes are moist, without exudate or lesions  Neck: normal, supple, no masses, no thyromegaly Respiratory: clear to auscultation bilaterally, no wheezing, no crackles. Normal respiratory effort  Cardiovascular: Regular rate and rhythm, 2 out of 6 murmur, rubs or gallops. No extremity edema. 2+ pedal pulses. No carotid bruits.  Abdomen: Soft, non tender, No hepatosplenomegaly. Bowel sounds positive.  Musculoskeletal: no clubbing / cyanosis. Moves all extremities Skin: no jaundice multiple areas of bruising, and excoriations, after his fall..  Neurologic: Sensation intact  Strength equal in all extremities.  Somewhat hard of hearing. Psychiatric:   Alert and oriented x 3. Normal mood.     Labs on Admission: I have personally reviewed following labs and imaging studies  CBC: Recent Labs  Lab 05/08/17 0320  WBC 8.3  NEUTROABS 3.3  HGB 8.9*  HCT 28.2*  MCV 91.3  PLT 703    Basic Metabolic Panel: Recent Labs  Lab 05/08/17 0320  NA 135  K 4.0  CL 102  CO2 28  GLUCOSE 105*  BUN 20  CREATININE 0.59*  CALCIUM 8.2*    GFR: Estimated Creatinine Clearance: 69.7 mL/min (A) (by C-G formula based on SCr of 0.59 mg/dL (L)).  Liver Function Tests: No results for input(s): AST, ALT, ALKPHOS, BILITOT, PROT, ALBUMIN in the last 168 hours. No results for input(s): LIPASE, AMYLASE in the last 168  hours. No results for input(s): AMMONIA in the last 168 hours.  Coagulation Profile: Recent Labs  Lab 05/08/17 0437  INR 1.29  Cardiac Enzymes: No results for input(s): CKTOTAL, CKMB, CKMBINDEX, TROPONINI in the last 168 hours.  BNP (last 3 results) No results for input(s): PROBNP in the last 8760 hours.  HbA1C: No results for input(s): HGBA1C in the last 72 hours.  CBG: No results for input(s): GLUCAP in the last 168 hours.  Lipid Profile: No results for input(s): CHOL, HDL, LDLCALC, TRIG, CHOLHDL, LDLDIRECT in the last 72 hours.  Thyroid Function Tests: No results for input(s): TSH, T4TOTAL, FREET4, T3FREE, THYROIDAB in the last 72 hours.  Anemia Panel: No results for input(s): VITAMINB12, FOLATE, FERRITIN, TIBC, IRON, RETICCTPCT in the last 72 hours.  Urine analysis:    Component Value Date/Time   COLORURINE YELLOW 05/08/2017 0628   APPEARANCEUR CLEAR 05/08/2017 0628   LABSPEC 1.020 05/08/2017 0628   PHURINE 6.0 05/08/2017 0628   GLUCOSEU NEGATIVE 05/08/2017 0628   HGBUR NEGATIVE 05/08/2017 0628   BILIRUBINUR NEGATIVE 05/08/2017 0628   KETONESUR NEGATIVE 05/08/2017 0628   PROTEINUR NEGATIVE 05/08/2017 0628   NITRITE NEGATIVE 05/08/2017 0628   LEUKOCYTESUR NEGATIVE 05/08/2017 0628    Sepsis Labs: @LABRCNTIP (procalcitonin:4,lacticidven:4) )No results found for this or any previous visit (from the past 240 hour(s)).   Radiological Exams on Admission: Dg Chest 2 View  Result Date: 05/08/2017 CLINICAL DATA:  84 year old male with fall. EXAM: CHEST  2 VIEW COMPARISON:  Chest radiograph dated 12/23/2016 FINDINGS: There is diffuse interstitial coarsening, chronic. No focal consolidation, pleural effusion, or pneumothorax. Stable borderline enlarged cardiac silhouette. There is atherosclerotic calcification of the aortic arch. Left pectoral pacemaker device there is osteopenia with degenerative changes of the spine. No acute osseous pathology IMPRESSION: No acute  cardiopulmonary process.  No interval change. Electronically Signed   By: Anner Crete M.D.   On: 05/08/2017 04:13   Ct Head Wo Contrast  Result Date: 05/08/2017 CLINICAL DATA:  Fall EXAM: CT HEAD WITHOUT CONTRAST CT MAXILLOFACIAL WITHOUT CONTRAST TECHNIQUE: Multidetector CT imaging of the head and maxillofacial structures were performed using the standard protocol without intravenous contrast. Multiplanar CT image reconstructions of the maxillofacial structures were also generated. COMPARISON:  None. FINDINGS: CT HEAD FINDINGS Brain: There is a small left convexity acute subdural hematoma that measures 5 mm in thickness. There is no associated mass effect. There is also subdural blood layering along the left leaflet of the tentorium cerebelli. No intraparenchymal hemorrhage. No hydrocephalus. There is periventricular hypoattenuation compatible with chronic microvascular disease. Vascular: No hyperdense vessel or unexpected calcification. Skull: No calvarial fracture. Normal skull base. CT MAXILLOFACIAL FINDINGS Osseous: --Complex facial fracture types: No LeFort, zygomaticomaxillary complex or nasoorbitoethmoidal fracture. --Simple fracture types: None. --Mandible, hard palate and teeth: No acute abnormality. Orbits: The globes are intact. Normal appearance of the intra- and extraconal fat. Symmetric extraocular muscles. Sinuses: Left mastoid and middle ear effusion. Small amount of right mastoid fluid. No paranasal sinus fluid levels or hemosinus. Soft tissues: Right facial hematoma. IMPRESSION: 1. Small left convexity and left tentorial subdural hematoma measuring 5 mm in thickness without associated midline shift or other mass effect. 2. No skull or facial fracture. 3. Small right facial hematoma. 4. Left mastoid and middle ear effusion. No associated temporal bone fracture is visible, but clinical/otoscopic assessment for hemotympanum or mastoid tenderness is suggested. Critical Value/emergent results  were called by telephone at the time of interpretation on 05/08/2017 at 4:12 am to Dr. Ripley Fraise , who verbally acknowledged these results. Electronically Signed   By: Ulyses Jarred M.D.   On: 05/08/2017 04:16   Ct Maxillofacial Wo  Contrast  Result Date: 05/08/2017 CLINICAL DATA:  Fall EXAM: CT HEAD WITHOUT CONTRAST CT MAXILLOFACIAL WITHOUT CONTRAST TECHNIQUE: Multidetector CT imaging of the head and maxillofacial structures were performed using the standard protocol without intravenous contrast. Multiplanar CT image reconstructions of the maxillofacial structures were also generated. COMPARISON:  None. FINDINGS: CT HEAD FINDINGS Brain: There is a small left convexity acute subdural hematoma that measures 5 mm in thickness. There is no associated mass effect. There is also subdural blood layering along the left leaflet of the tentorium cerebelli. No intraparenchymal hemorrhage. No hydrocephalus. There is periventricular hypoattenuation compatible with chronic microvascular disease. Vascular: No hyperdense vessel or unexpected calcification. Skull: No calvarial fracture. Normal skull base. CT MAXILLOFACIAL FINDINGS Osseous: --Complex facial fracture types: No LeFort, zygomaticomaxillary complex or nasoorbitoethmoidal fracture. --Simple fracture types: None. --Mandible, hard palate and teeth: No acute abnormality. Orbits: The globes are intact. Normal appearance of the intra- and extraconal fat. Symmetric extraocular muscles. Sinuses: Left mastoid and middle ear effusion. Small amount of right mastoid fluid. No paranasal sinus fluid levels or hemosinus. Soft tissues: Right facial hematoma. IMPRESSION: 1. Small left convexity and left tentorial subdural hematoma measuring 5 mm in thickness without associated midline shift or other mass effect. 2. No skull or facial fracture. 3. Small right facial hematoma. 4. Left mastoid and middle ear effusion. No associated temporal bone fracture is visible, but  clinical/otoscopic assessment for hemotympanum or mastoid tenderness is suggested. Critical Value/emergent results were called by telephone at the time of interpretation on 05/08/2017 at 4:12 am to Dr. Ripley Fraise , who verbally acknowledged these results. Electronically Signed   By: Ulyses Jarred M.D.   On: 05/08/2017 04:16    EKG: Independently reviewed.  Assessment/Plan Active Problems:   Subdural hematoma (HCC)   Barrett's esophagus   COPD (chronic obstructive pulmonary disease) (HCC)   CAD (coronary artery disease)   S/P placement of cardiac pacemaker   Atrial fibrillation (HCC)   Atrial fibrillation with rapid ventricular response (HCC)   Anemia   SSS (sick sinus syndrome) (HCC)   CHF exacerbation (HCC)   BPH (benign prostatic hyperplasia)   HTN (hypertension)   PAF (paroxysmal atrial fibrillation) (HCC)   Dementia   Asthma    Left subdural hematoma, the patient with anticoagulation with Eliquis.   K Centra rapid reversal of Eliquis by pharmacy, due to high possibility of worsening subdural hemorrhage, as Eliquis has not cleared yet.  K Centra was ordered at 5:53 AM. Subdural hematoma found.in CT head, evaluated by Dr. Arnoldo Morale, neurosurgery.  Per his notes, this hematoma is small, however, due to risk of enlargement due to anticoagulation, recommendations include holding Eliquis for at least 7 days, and follow-up with his cardiologist, on when to return to this anticoagulant.    At this time, no signs of acute GI bleed were seen Repeat CT in a.m. of the head to follow-up on any changes on this hematoma. CBC in am    Paroxysmal Atrial Fibrillation  on anticoagulation with Eliquis as above, now on hold   Sinus rhythm Ventricular premature complex   Will determine if patient will need to resume AC as OP, as per Cards.  SCDs for now   Anemia of chronic disease and acute bleed Hemoglobin on admission 8.9 Hcult neg  Repeat CBC in am  No transfusion is indicated at this  time Continue Iron supplements   Hypertension, now with hypotension  BP  96/57   Pulse 60 , unknown if med related, non toxic apppearing.  Afebrile, UA normal. Received 300 cc IVF on transport  Hold home anti-hypertensive medications    Benign prostatic hypertrophy, no acute issues Continue Proscar and Flomax   History of Seizures, no acute issues   Continue Depakote Continue Chronulac   Asthma without exacerbation   Continue nebs and O2 prn  Continue Dulera  Depression Continue home Zoloft  Insomnia Continue Restoril      DVT prophylaxis:  SCD Code Status:    DNR  Family Communication:  Discussed with patient Disposition Plan: Expect patient to be discharged to home after condition improves Consults called:    Neurosurgery, Dr. Arnoldo Morale as per EDP  Admission status: SDU obs    Sharene Butters, PA-C [facility]   Amion text  [id number]   05/08/2017, 9:30 AM

## 2017-05-08 NOTE — ED Notes (Signed)
Patient transported to CT 

## 2017-05-08 NOTE — ED Notes (Signed)
ED Provider at bedside. 

## 2017-05-08 NOTE — ED Provider Notes (Signed)
[facility] EMERGENCY DEPARTMENT Provider Note   CSN: [id number] Arrival date & time: 05/08/17  0109     History   Chief Complaint Chief Complaint  Patient presents with  . Fall    HPI Steve Norris is a 84 y.o. male.  The history is provided by the patient.  Fall  This is a new problem. Episode onset: Prior to arrival. The problem occurs constantly. The problem has not changed since onset.Associated symptoms include headaches. Pertinent negatives include no chest pain and no abdominal pain. The symptoms are aggravated by walking. Nothing relieves the symptoms.  Patient presents from nursing facility after a fall Apparently he was attempting to use the restroom when he fell in the shower because he lost his balance, patient is unsure if he had loss of consciousness He currently reports a headache and multiple skin tears He is on Eliquis and has a pacemaker EMS he was mildly hypotensive requiring IV fluids  Past Medical History:  Diagnosis Date  . Arthritis   . Asthma   . Barrett's esophagus 03-19-2009   EGD  . CAD (coronary artery disease)    a. s/p stent placement in 2006 to the LAD and RCA  . Cataract    REMOVED  . COPD (chronic obstructive pulmonary disease) ([place])   . Diverticulosis 03-19-2009   Colonoscopy   . Erosive esophagitis 03-19-2009   EGD  . GERD (gastroesophageal reflux disease)   . Hiatal hernia 03-19-2009   EGD  . Hyperlipidemia   . Hypertension   . Internal hemorrhoids 03-19-2009   Colonoscopy  . Paroxysmal atrial fibrillation (HCC)   . Sinus node dysfunction (HCC)    a. s/p PPM placement in 2011    Patient Active Problem List   Diagnosis Date Noted  . New onset seizure ([place]) 12/23/2016  . Acute left otitis media 12/23/2016  . Acute encephalopathy 12/23/2016  . PAF (paroxysmal atrial fibrillation) ([place]) 12/23/2016  . Benign essential HTN 12/23/2016  . Chronic diastolic heart failure, NYHA class 2 ([place]) 12/23/2016  .  Hyperlipidemia 12/23/2016  . Dementia 12/23/2016  . Asthma 12/23/2016  . Elevated AST (SGOT) 12/23/2016  . Acute diastolic CHF (congestive heart failure) ([place])   . Pressure injury of skin 08/09/2016  . BPH (benign prostatic hyperplasia) 08/09/2016  . HTN (hypertension) 08/09/2016  . CHF exacerbation ([place]) 08/08/2016  . Anemia 03/25/2016  . SSS (sick sinus syndrome) ([place]) 03/25/2016  . CAP (community acquired pneumonia)   . Atrial fibrillation with rapid ventricular response ([place]) 11/04/2014  . Dyspnea 12/15/2012  . Long term current use of anticoagulant therapy 07/14/2012  . COPD (chronic obstructive pulmonary disease) ([place]) 02/23/2012  . CAD (coronary artery disease) 02/23/2012  . S/P placement of cardiac pacemaker 02/23/2012  . Atrial fibrillation ([place]) 02/23/2012  . BARRETT'S ESOPHAGUS 04/30/2009    Past Surgical History:  Procedure Laterality Date  . CARDIAC CATHETERIZATION  02/12/2009   mild ostial left main disease,patent LAD & RCA stents   . CARDIOVERSION N/A 11/06/2014   Procedure: CARDIOVERSION;  Surgeon: [name], MD;  Location: [facility] ENDOSCOPY;  Service: Cardiovascular;  Laterality: N/A;  . CARDIOVERSION N/A 08/02/2015   Procedure: CARDIOVERSION;  Surgeon: Sueanne Margarita, MD;  Location: [facility] ENDOSCOPY;  Service: Cardiovascular;  Laterality: N/A;  . CATARACT EXTRACTION    . COLONOSCOPY    . CORONARY STENT PLACEMENT  2006   Right Coronary Artery Cypher stents placed 2006  . HEMORRHOID SURGERY    . LEFT HEART CATHETERIZATION WITH CORONARY  ANGIOGRAM N/A 07/01/2013   Procedure: LEFT HEART CATHETERIZATION WITH CORONARY ANGIOGRAM;  Surgeon: Sanda Klein, MD;  Location: [place] [facility] CATH LAB;  Service: Cardiovascular;  Laterality: N/A;  . NM MYOCAR PERF WALL MOTION  08/14/2008   no significant ischemia EF 64%  . PACEMAKER INSERTION  07/25/2009   [employer] Accent  . TEE WITHOUT CARDIOVERSION N/A 08/02/2015   Procedure: TRANSESOPHAGEAL ECHOCARDIOGRAM (TEE);  Surgeon: Sueanne Margarita, MD;   Location: [facility] ENDOSCOPY;  Service: Cardiovascular;  Laterality: N/A;  . TONSILLECTOMY    . US ECHOCARDIOGRAPHY  07/12/2009   borderline LA enlargement,mild mitral annular ca+, AOV mildly sclerotic,trace AI.  Marland Kitchen VASECTOMY         Home Medications    Prior to Admission medications   Medication Sig Start Date End Date Taking? Authorizing Provider  acetaminophen (TYLENOL) 500 MG tablet Take 1 tablet (500 mg total) by mouth every 6 (six) hours as needed. Patient taking differently: Take 1,000 mg by mouth at bedtime.  12/27/16  Yes Domenic Moras, PA-C  acetaminophen (TYLENOL) 500 MG tablet Take 500 mg by mouth every 6 (six) hours as needed for fever.   Yes [provider]  albuterol (ACCUNEB) 0.63 MG/3ML nebulizer solution Take 1 ampule by nebulization every 8 (eight) hours as needed for wheezing.   Yes [provider]  apixaban (ELIQUIS) 5 MG TABS tablet Take 5 mg by mouth 2 (two) times daily.   Yes [provider]  divalproex (DEPAKOTE) 250 MG DR tablet TAKE (1) TABLET BY MOUTH TWICE DAILY. 02/23/17  Yes Garvin Fila, MD  finasteride (PROSCAR) 5 MG tablet Take 5 mg by mouth daily.  02/17/12  Yes [provider]  Fluticasone-Salmeterol (ADVAIR) 250-50 MCG/DOSE AEPB Inhale 1 puff into the lungs 2 (two) times daily.   Yes [provider]  furosemide (LASIX) 20 MG tablet Take 1 tablet (20 mg total) by mouth daily. 01/10/16  Yes Croitoru, Mihai, MD  lactulose (CHRONULAC) 10 GM/15ML solution Take 20 g by mouth 2 (two) times daily.   Yes [provider]  mineral oil liquid Place 0.05 mLs in ear(s) once a week. On Thursday   Yes [provider]  Multiple Vitamins-Minerals (MULTIVITAMIN WITH MINERALS) tablet Take 1 tablet by mouth daily.   Yes [provider]  nitroGLYCERIN (NITROSTAT) 0.4 MG SL tablet Place 0.4 mg under the tongue every 5 (five) minutes as needed for chest pain.    Yes [provider]  potassium chloride SA  (K-DUR,KLOR-CON) 20 MEQ tablet take 1 tablet by mouth once daily Patient taking differently: 20 mEq by mouth daily 12/18/15  Yes Croitoru, Mihai, MD  rosuvastatin (CRESTOR) 10 MG tablet Take 10 mg by mouth daily.   Yes [provider]  senna-docusate (SENNA-S) 8.6-50 MG per tablet Take 1 tablet by mouth as needed for mild constipation.    Yes [provider]  sertraline (ZOLOFT) 100 MG tablet Take 100 mg by mouth daily.   Yes [provider]  sotalol (BETAPACE) 80 MG tablet take 1 tablet by mouth every 12 hours Patient taking differently: 80 mg oral twice daily 02/11/16  Yes Croitoru, Mihai, MD  tamsulosin (FLOMAX) 0.4 MG CAPS capsule Take 1 capsule by mouth daily. 12/27/15  Yes [provider]  temazepam (RESTORIL) 15 MG capsule Take 15 mg by mouth at bedtime.   Yes [provider]    Family History Family History  Problem Relation Age of Onset  . Heart disease Mother   . Heart disease Father   .  Colon cancer Neg Hx     Social History Social History   Tobacco Use  . Smoking status: Former Research scientist (life sciences)  . Smokeless tobacco: Never Used  . Tobacco comment: Quit smoking 1996  Substance Use Topics  . Alcohol use: No  . Drug use: No     Allergies   Amiodarone; Bactrim [sulfamethoxazole-trimethoprim]; and Penicillins   Review of Systems Review of Systems  Constitutional: Negative for fever.  Cardiovascular: Negative for chest pain.  Gastrointestinal: Negative for abdominal pain.  Neurological: Positive for headaches.  All other systems reviewed and are negative.    Physical Exam Updated Vital Signs BP 94/60   Pulse 64   Temp (!) 97.3 F (36.3 C) (Oral)   Resp 15   Ht 1.778 m (5' 10" )   Wt 81.6 kg (180 lb)   SpO2 98%   BMI 25.83 kg/m   Physical Exam CONSTITUTIONAL: Elderly, no acute distress HEAD: Normocephalic/atraumatic, facial contusion EYES: EOMI/PERRL ENMT: Mucous membranes moist, contusion of right maxilla with  associated abrasion, no other signs of significant facial trauma NECK: supple no meningeal signs SPINE/BACK:entire spine nontender, nexus criteria met, no bruising/crepitance/stepoffs noted to spine CV: S1/S2 noted, no murmurs/rubs/gallops noted LUNGS: Decreased breath sounds bilaterally ABDOMEN: soft, nontender, no rebound or guarding, bowel sounds noted throughout abdomen GU:no cva tenderness NEURO: Pt is awake/alert/appropriate, moves all extremitiesx4.  No facial droop.   EXTREMITIES: pulses normal/equal, full ROM, pelvis stable, all other extremities/joints palpated/ranged and nontender SKIN: warm, color normal, scattered abrasions to right upper extremity, left upper extremity, no lacerations noted PSYCH: no abnormalities of mood noted, alert and oriented to situation   ED Treatments / Results  Labs (all labs ordered are listed, but only abnormal results are displayed) Labs Reviewed  BASIC METABOLIC PANEL - Abnormal; Notable for the following components:      Result Value   Glucose, Bld 105 (*)    Creatinine, Ser 0.59 (*)    Calcium 8.2 (*)    All other components within normal limits  CBC WITH DIFFERENTIAL/PLATELET - Abnormal; Notable for the following components:   RBC 3.09 (*)    Hemoglobin 8.9 (*)    HCT 28.2 (*)    RDW 18.0 (*)    Monocytes Absolute 1.6 (*)    All other components within normal limits  VALPROIC ACID LEVEL - Abnormal; Notable for the following components:   Valproic Acid Lvl 29 (*)    All other components within normal limits  PROTIME-INR - Abnormal; Notable for the following components:   Prothrombin Time 16.0 (*)    All other components within normal limits  URINALYSIS, ROUTINE W REFLEX MICROSCOPIC  POC OCCULT BLOOD, ED  TYPE AND SCREEN  ABO/RH    EKG  EKG Interpretation  Date/Time:  Friday May 08 2017 03:26:58 EST Ventricular Rate:  61 PR Interval:    QRS Duration: 122 QT Interval:  464 QTC Calculation: 468 R Axis:   34 Text  Interpretation:  Sinus rhythm Ventricular premature complex IVCD, consider atypical RBBB Confirmed by Ripley Fraise [phone]) on 05/08/2017 3:39:20 AM       Radiology Dg Chest 2 View  Result Date: 05/08/2017 CLINICAL DATA:  84 year old male with fall. EXAM: CHEST  2 VIEW COMPARISON:  Chest radiograph dated 12/23/2016 FINDINGS: There is diffuse interstitial coarsening, chronic. No focal consolidation, pleural effusion, or pneumothorax. Stable borderline enlarged cardiac silhouette. There is atherosclerotic calcification of the aortic arch. Left pectoral pacemaker device there is osteopenia with degenerative changes of the spine. No acute  osseous pathology IMPRESSION: No acute cardiopulmonary process.  No interval change. Electronically Signed   By: Anner Crete M.D.   On: 05/08/2017 04:13   Ct Head Wo Contrast  Result Date: 05/08/2017 CLINICAL DATA:  Fall EXAM: CT HEAD WITHOUT CONTRAST CT MAXILLOFACIAL WITHOUT CONTRAST TECHNIQUE: Multidetector CT imaging of the head and maxillofacial structures were performed using the standard protocol without intravenous contrast. Multiplanar CT image reconstructions of the maxillofacial structures were also generated. COMPARISON:  None. FINDINGS: CT HEAD FINDINGS Brain: There is a small left convexity acute subdural hematoma that measures 5 mm in thickness. There is no associated mass effect. There is also subdural blood layering along the left leaflet of the tentorium cerebelli. No intraparenchymal hemorrhage. No hydrocephalus. There is periventricular hypoattenuation compatible with chronic microvascular disease. Vascular: No hyperdense vessel or unexpected calcification. Skull: No calvarial fracture. Normal skull base. CT MAXILLOFACIAL FINDINGS Osseous: --Complex facial fracture types: No LeFort, zygomaticomaxillary complex or nasoorbitoethmoidal fracture. --Simple fracture types: None. --Mandible, hard palate and teeth: No acute abnormality. Orbits: The globes  are intact. Normal appearance of the intra- and extraconal fat. Symmetric extraocular muscles. Sinuses: Left mastoid and middle ear effusion. Small amount of right mastoid fluid. No paranasal sinus fluid levels or hemosinus. Soft tissues: Right facial hematoma. IMPRESSION: 1. Small left convexity and left tentorial subdural hematoma measuring 5 mm in thickness without associated midline shift or other mass effect. 2. No skull or facial fracture. 3. Small right facial hematoma. 4. Left mastoid and middle ear effusion. No associated temporal bone fracture is visible, but clinical/otoscopic assessment for hemotympanum or mastoid tenderness is suggested. Critical Value/emergent results were called by telephone at the time of interpretation on 05/08/2017 at 4:12 am to Dr. Ripley Fraise , who verbally acknowledged these results. Electronically Signed   By: Ulyses Jarred M.D.   On: 05/08/2017 04:16   Ct Maxillofacial Wo Contrast  Result Date: 05/08/2017 CLINICAL DATA:  Fall EXAM: CT HEAD WITHOUT CONTRAST CT MAXILLOFACIAL WITHOUT CONTRAST TECHNIQUE: Multidetector CT imaging of the head and maxillofacial structures were performed using the standard protocol without intravenous contrast. Multiplanar CT image reconstructions of the maxillofacial structures were also generated. COMPARISON:  None. FINDINGS: CT HEAD FINDINGS Brain: There is a small left convexity acute subdural hematoma that measures 5 mm in thickness. There is no associated mass effect. There is also subdural blood layering along the left leaflet of the tentorium cerebelli. No intraparenchymal hemorrhage. No hydrocephalus. There is periventricular hypoattenuation compatible with chronic microvascular disease. Vascular: No hyperdense vessel or unexpected calcification. Skull: No calvarial fracture. Normal skull base. CT MAXILLOFACIAL FINDINGS Osseous: --Complex facial fracture types: No LeFort, zygomaticomaxillary complex or nasoorbitoethmoidal fracture.  --Simple fracture types: None. --Mandible, hard palate and teeth: No acute abnormality. Orbits: The globes are intact. Normal appearance of the intra- and extraconal fat. Symmetric extraocular muscles. Sinuses: Left mastoid and middle ear effusion. Small amount of right mastoid fluid. No paranasal sinus fluid levels or hemosinus. Soft tissues: Right facial hematoma. IMPRESSION: 1. Small left convexity and left tentorial subdural hematoma measuring 5 mm in thickness without associated midline shift or other mass effect. 2. No skull or facial fracture. 3. Small right facial hematoma. 4. Left mastoid and middle ear effusion. No associated temporal bone fracture is visible, but clinical/otoscopic assessment for hemotympanum or mastoid tenderness is suggested. Critical Value/emergent results were called by telephone at the time of interpretation on 05/08/2017 at 4:12 am to Dr. Ripley Fraise , who verbally acknowledged these results. Electronically Signed   By:  Ulyses Jarred M.D.   On: 05/08/2017 04:16    Procedures Procedures  CRITICAL CARE Performed by: Sharyon Cable Total critical care time: 35 minutes Critical care time was exclusive of separately billable procedures and treating other patients. Critical care was necessary to treat or prevent imminent or life-threatening deterioration. Critical care was time spent personally by me on the following activities: development of treatment plan with patient and/or surrogate as well as nursing, discussions with consultants, evaluation of patient's response to treatment, examination of patient, obtaining history from patient or surrogate, ordering and performing treatments and interventions, ordering and review of laboratory studies, ordering and review of radiographic studies, pulse oximetry and re-evaluation of patient's condition. Patient with subdural hematoma, requiring admission/monitoring  Medications Ordered in ED Medications  prothrombin complex  conc human (KCENTRA) IVPB 4,072 Units (not administered)  Tdap (BOOSTRIX) injection 0.5 mL (0.5 mLs Intramuscular Given 05/08/17 0434)  sodium chloride 0.9 % bolus 1,000 mL (1,000 mLs Intravenous New Bag/Given 05/08/17 0434)     Initial Impression / Assessment and Plan / ED Course  I have reviewed the triage vital signs and the nursing notes.  Pertinent labs & imaging results that were available during my care of the patient were reviewed by me and considered in my medical decision making (see chart for details).     5:00 AM Patient is awake and alert, GCS of 15. CT head reveals small subdural I spoke with Dr. Earle Gell with neurosurgery To review CT head and call me back 5:13 AM Discussed with Dr. Arnoldo Morale, he will see patient but recommends admission to the medical service After discussion with pharmacy, patient will benefit from [place] rapid reversal of his Eliquis due to high possibility of worsening subdural and eliquis has not fully cleared as of yet This has been ordered 5:53 AM Discussed with Dr. Hal Hope for admission Of note, he was found to be anemic, but no signs of acute GI bleed this can be monitored as an inpatient  Final Clinical Impressions(s) / ED Diagnoses   Final diagnoses:  Subdural hematoma ([place])  Contusion of face, initial encounter    ED Discharge Orders    None       Ripley Fraise, MD 05/08/17 [phone]

## 2017-05-08 NOTE — ED Notes (Addendum)
Spoke with son, who is power of attorney-- given update--- [phone].

## 2017-05-08 NOTE — ED Triage Notes (Signed)
Pt arrived via [facility] from [facility]; per EMS pt attempted to use restroom and fell in the shower; pt has multiple skin tears to RUE ; swelling to R eye and bilateral knees. Pt is on Eliquis, has pacemaker, with Hx of COPD and CHF; EMS gave 366ml of fluid in route and states pt has been hypotensive

## 2017-05-09 ENCOUNTER — Observation Stay (HOSPITAL_COMMUNITY): Payer: Medicare Other

## 2017-05-09 DIAGNOSIS — I480 Paroxysmal atrial fibrillation: Secondary | ICD-10-CM | POA: Diagnosis not present

## 2017-05-09 DIAGNOSIS — S065X9A Traumatic subdural hemorrhage with loss of consciousness of unspecified duration, initial encounter: Secondary | ICD-10-CM | POA: Diagnosis not present

## 2017-05-09 LAB — BASIC METABOLIC PANEL
ANION GAP: 5 (ref 5–15)
BUN: 12 mg/dL (ref 6–20)
CALCIUM: 8 mg/dL — AB (ref 8.9–10.3)
CO2: 27 mmol/L (ref 22–32)
CREATININE: 0.45 mg/dL — AB (ref 0.61–1.24)
Chloride: 104 mmol/L (ref 101–111)
GFR calc non Af Amer: >60 mL/min (ref >60)
Glucose, Bld: 90 mg/dL (ref 65–99)
Potassium: 3.8 mmol/L (ref 3.5–5.1)
SODIUM: 136 mmol/L (ref 135–145)

## 2017-05-09 LAB — CBC
HEMATOCRIT: 27.1 % — AB (ref 39.0–52.0)
HEMOGLOBIN: 8.5 g/dL — AB (ref 13.0–17.0)
MCH: 28.8 pg (ref 26.0–34.0)
MCHC: 31.4 g/dL (ref 30.0–36.0)
MCV: 91.9 fL (ref 78.0–100.0)
Platelets: 238 10*3/uL (ref 150–400)
RBC: 2.95 MIL/uL — ABNORMAL LOW (ref 4.22–5.81)
RDW: 17.9 % — AB (ref 11.5–15.5)
WBC: 7 10*3/uL (ref 4.0–10.5)

## 2017-05-09 MED ORDER — ORAL CARE MOUTH RINSE: 15.0000 mL | Freq: Two times a day (BID) | OROMUCOSAL | Status: DC

## 2017-05-09 NOTE — Clinical Social Work Note (Signed)
CSW spoke with RN at ALF. She will review discharge summary and FL2 and call back to confirm documentation looks correct and patient can return.  Dayton Scrape, [place]

## 2017-05-09 NOTE — Plan of Care (Signed)
Pt has done well overnight, pt has consistent neuro examinations WNL and seems to be progressing in all areas.

## 2017-05-09 NOTE — NC FL2 (Signed)
[place] LEVEL OF CARE SCREENING TOOL     IDENTIFICATION  Patient Name: Steve Norris Birthdate: 05/27/1929 Sex: male Admission Date (Current Location): 05/08/2017  [facility] and [place] Number:  Herbalist and Address:  [facility], [place] [address], [place], [place] [address]      Provider Number: [id number]  Attending Physician Name and Address:  Patrecia Pour, Christean Grief, MD  Relative Name and Phone Number:       Current Level of Care: Hospital Recommended Level of Care: [place] Prior Approval Number:    Date Approved/Denied:   PASRR Number:    Discharge Plan: Other (Comment)(ALF)    Current Diagnoses: Patient Active Problem List   Diagnosis Date Noted  . Subdural hematoma ([place]) 05/08/2017  . New onset seizure ([place]) 12/23/2016  . Acute left otitis media 12/23/2016  . Acute encephalopathy 12/23/2016  . PAF (paroxysmal atrial fibrillation) ([place]) 12/23/2016  . Benign essential HTN 12/23/2016  . Chronic diastolic heart failure, NYHA class 2 ([place]) 12/23/2016  . Hyperlipidemia 12/23/2016  . Dementia 12/23/2016  . Asthma 12/23/2016  . Elevated AST (SGOT) 12/23/2016  . Acute diastolic CHF (congestive heart failure) ([place])   . Pressure injury of skin 08/09/2016  . BPH (benign prostatic hyperplasia) 08/09/2016  . HTN (hypertension) 08/09/2016  . CHF exacerbation ([place]) 08/08/2016  . Anemia 03/25/2016  . SSS (sick sinus syndrome) ([place]) 03/25/2016  . CAP (community acquired pneumonia)   . Atrial fibrillation with rapid ventricular response ([place]) 11/04/2014  . Dyspnea 12/15/2012  . Long term current use of anticoagulant therapy 07/14/2012  . COPD (chronic obstructive pulmonary disease) ([place]) 02/23/2012  . CAD (coronary artery disease) 02/23/2012  . S/P placement of cardiac pacemaker 02/23/2012  . Atrial fibrillation ([place]) 02/23/2012  . Barrett's esophagus 04/30/2009    Orientation RESPIRATION BLADDER Height &  Weight     Self, Time, Situation, Place  O2(Nasal Canula 2 L.) Continent Weight: 159 lb 2.8 oz (72.2 kg) Height:  5\' 10"  (177.8 cm)  BEHAVIORAL SYMPTOMS/MOOD NEUROLOGICAL BOWEL NUTRITION STATUS  Other (Comment)(Calm, cooperative, appropriate.) (Dementia) Continent Diet(Heart healthy)  AMBULATORY STATUS COMMUNICATION OF NEEDS Skin     Verbally Skin abrasions, Bruising(Right hand, right shoulder, right arm, right face, right anterior knee. Left anterior knee, left anterior arm, left elbow.)                       Personal Care Assistance Level of Assistance              Functional Limitations Info  Hearing, Speech, Sight Sight Info: Adequate Hearing Info: Adequate Speech Info: Adequate    SPECIAL CARE FACTORS FREQUENCY  Blood pressure                    Contractures Contractures Info: Not present    Additional Factors Info  Code Status, Allergies Code Status Info: DNR Allergies Info: Amiodarone, Bactrim (Sulfamethoxazole-trimethoprim), Penicillins           Current Medications (05/09/2017):  This is the current hospital active medication list Current Facility-Administered Medications  Medication Dose Route Frequency Provider Last Rate Last Dose  . 0.9 %  sodium chloride infusion   Intravenous Continuous Rondel Jumbo, PA-C 75 mL/hr at 05/08/17 2224    . acetaminophen (TYLENOL) tablet 650 mg  650 mg Oral Q6H PRN Sharene Butters E, PA-C       Or  . acetaminophen (TYLENOL) suppository 650 mg  650  mg Rectal Q6H PRN Rondel Jumbo, PA-C      . albuterol (PROVENTIL) (2.5 MG/3ML) 0.083% nebulizer solution 2.5 mg  2.5 mg Nebulization Q8H PRN Lady Deutscher, MD      . bisacodyl (DULCOLAX) suppository 10 mg  10 mg Rectal Daily PRN Rondel Jumbo, PA-C      . divalproex (DEPAKOTE) DR tablet 250 mg  250 mg Oral Q12H Rondel Jumbo, PA-C   250 mg at 05/09/17 1121  . finasteride (PROSCAR) tablet 5 mg  5 mg Oral Daily Rondel Jumbo, PA-C   5 mg at 05/09/17 1122  .  HYDROcodone-acetaminophen (NORCO/VICODIN) 5-325 MG per tablet 1-2 tablet  1-2 tablet Oral Q4H PRN Rondel Jumbo, PA-C   1 tablet at 05/08/17 2123  . lactulose (CHRONULAC) 10 GM/15ML solution 20 g  20 g Oral BID Rondel Jumbo, PA-C   20 g at 05/09/17 1121  . MEDLINE mouth rinse  15 mL Mouth Rinse BID Lady Deutscher, MD   Stopped at 05/09/17 0125  . mometasone-formoterol (DULERA) 200-5 MCG/ACT inhaler 2 puff  2 puff Inhalation BID Rondel Jumbo, PA-C   2 puff at 05/08/17 2124  . multivitamin with minerals tablet 1 tablet  1 tablet Oral Daily Lady Deutscher, MD   1 tablet at 05/09/17 1122  . ondansetron (ZOFRAN) tablet 4 mg  4 mg Oral Q6H PRN Rondel Jumbo, PA-C       Or  . ondansetron [facility]) injection 4 mg  4 mg Intravenous Q6H PRN Sharene Butters E, PA-C      . potassium chloride SA (K-DUR,KLOR-CON) CR tablet 20 mEq  20 mEq Oral Daily Rondel Jumbo, PA-C   20 mEq at 05/09/17 1123  . rosuvastatin (CRESTOR) tablet 10 mg  10 mg Oral Daily Rondel Jumbo, PA-C   10 mg at 05/09/17 1122  . senna-docusate (Senokot-S) tablet 1 tablet  1 tablet Oral QHS PRN Rondel Jumbo, PA-C      . sertraline (ZOLOFT) tablet 100 mg  100 mg Oral Daily Sharene Butters E, PA-C   100 mg at 05/09/17 1123  . tamsulosin (FLOMAX) capsule 0.4 mg  0.4 mg Oral Daily Rondel Jumbo, PA-C   0.4 mg at 05/09/17 1124  . temazepam (RESTORIL) capsule 15 mg  15 mg Oral QHS Lady Deutscher, MD   15 mg at 05/08/17 2124     Discharge Medications: STOP taking these medications   ELIQUIS 5 MG Tabs tablet Generic drug:  apixaban     TAKE these medications   acetaminophen 500 MG tablet Commonly known as:  TYLENOL Take 500 mg by mouth every 6 (six) hours as needed for fever. What changed:  Another medication with the same name was changed. Make sure you understand how and when to take each.   acetaminophen 500 MG tablet Commonly known as:  TYLENOL Take 1 tablet (500 mg total) by mouth every 6 (six) hours as  needed. What changed:    how much to take  when to take this   albuterol 0.63 MG/3ML nebulizer solution Commonly known as:  ACCUNEB Take 1 ampule by nebulization every 8 (eight) hours as needed for wheezing.   divalproex 250 MG DR tablet Commonly known as:  DEPAKOTE TAKE (1) TABLET BY MOUTH TWICE DAILY.   finasteride 5 MG tablet Commonly known as:  PROSCAR Take 5 mg by mouth daily.   Fluticasone-Salmeterol 250-50 MCG/DOSE Aepb Commonly known as:  ADVAIR Inhale 1 puff  into the lungs 2 (two) times daily.   furosemide 20 MG tablet Commonly known as:  LASIX Take 1 tablet (20 mg total) by mouth daily.   lactulose 10 GM/15ML solution Commonly known as:  CHRONULAC Take 20 g by mouth 2 (two) times daily.   mineral oil liquid Place 0.05 mLs in ear(s) once a week. On Thursday   multivitamin with minerals tablet Take 1 tablet by mouth daily.   nitroGLYCERIN 0.4 MG SL tablet Commonly known as:  NITROSTAT Place 0.4 mg under the tongue every 5 (five) minutes as needed for chest pain.   potassium chloride SA 20 MEQ tablet Commonly known as:  K-DUR,KLOR-CON take 1 tablet by mouth once daily What changed:    how much to take  how to take this  when to take this   rosuvastatin 10 MG tablet Commonly known as:  CRESTOR Take 10 mg by mouth daily.   SENNA-S 8.6-50 MG tablet Generic drug:  senna-docusate Take 1 tablet by mouth as needed for mild constipation.   sertraline 100 MG tablet Commonly known as:  ZOLOFT Take 100 mg by mouth daily.   sotalol 80 MG tablet Commonly known as:  BETAPACE take 1 tablet by mouth every 12 hours What changed:    how much to take  how to take this  when to take this   tamsulosin 0.4 MG Caps capsule Commonly known as:  FLOMAX Take 1 capsule by mouth daily.   temazepam 15 MG capsule Commonly known as:  RESTORIL Take 15 mg by mouth at bedtime.      Relevant Imaging Results:  Relevant Lab  Results:   Additional Information SS#: [id number]  Candie Chroman, LCSW

## 2017-05-09 NOTE — Progress Notes (Signed)
Neurosurgery Progress Note  No issues overnight.  No HA, denies focal deficits Feels roughly at baseline  EXAM:  BP (!) 104/54   Pulse 68   Temp 97.6 F (36.4 C) (Oral)   Resp 13   Ht 5\' 10"  (1.778 m)   Wt 72.2 kg (159 lb 2.8 oz)   SpO2 99%   BMI 22.84 kg/m   Awake, alert, oriented  Speech fluent, appropriate  CN grossly intact  MAEW with good strength  PLAN Stable this am without focal deficits. Repeat head CT to be done later today. If stable, okay to be discharged from NS perspective. Remain off anti-coag x1 week and f/u with PCP  F/U outpatient with Dr Arnoldo Morale

## 2017-05-09 NOTE — Clinical Social Work Note (Signed)
CSW facilitated patient discharge including contacting patient family and facility to confirm patient discharge plans. Clinical information faxed to facility and family agreeable with plan. Patient's family will transport him by car back to [place] [facility]. RN to call report prior to discharge [phone]).  CSW will sign off for now as social work intervention is no longer needed. Please consult [place] again if new needs arise.  Dayton Scrape, [place]

## 2017-05-09 NOTE — Discharge Summary (Signed)
Physician Discharge Summary  Steve Norris  QIO:[id number]  DOB: [birth year]  DOA: 05/08/2017 PCP: Lonie Peak [place] date: 05/08/2017 Discharge date: 05/09/2017  Admitted From: SNF Disposition: SNF  Recommendations for Outpatient Follow-up:  1. Follow up with PCP in 1 week   2. Follow up with Dr Arnoldo Morale in 1-2 weeks  3. Remain off a/c x 1 week and follow up with PCP or cardiology for when to resume  4. Check CBC in 1 week   Discharge Condition: Stable  CODE STATUS: DNR  Diet recommendation: Heart Healthy   Brief/Interim Summary: For full details see H&P/Progress note but in brief  Steve Norris 84 year old male with medical history significant for COPD , CHF, CAD, PAF on Eliquis and hypertension was brought to the emergency department after sustaining a mechanical fall. upon ED evaluation was found to have a subdural hematoma, neurosurgery was consulted and recommended monitoring with no surgical interventions.  CT of the head was repeated 24 hours after an hematoma appears to be stable. Neurosurgery recommended outpatient follow up.   Subjective: Patient seen and examined, denies any headaches, weakness and blurry vision.   Discharge Diagnoses/Hospital Course:  Active Problems:   Barrett's esophagus   COPD (chronic obstructive pulmonary disease) (HCC)   CAD (coronary artery disease)   S/P placement of cardiac pacemaker   Atrial fibrillation (HCC)   Atrial fibrillation with rapid ventricular response (HCC)   Anemia   SSS (sick sinus syndrome) (HCC)   CHF exacerbation (HCC)   BPH (benign prostatic hyperplasia)   HTN (hypertension)   PAF (paroxysmal atrial fibrillation) (HCC)   Dementia   Asthma   Subdural hematoma (HCC)   Subdural hematoma  Small hematoma, per neurosurgery no need for surgical intervention  CT shows stable hematoma with no progression or expansion  Hold Eliquis for 1 week   PAF - Stable  Continue home medications with no changes  Hold  eliquis for 1 week to discuss with PCP when to resume   HTN  BP was soft Want to avoid high BP for now   Resume home meds   All other chronic medical condition were stable during the hospitalization.  On the day of the discharge the patient's vitals were stable, and no other acute medical condition were reported by patient. the patient was felt safe to be discharge to SNF   Discharge Instructions  You were cared for by a hospitalist during your hospital stay. If you have any questions about your discharge medications or the care you received while you were in the hospital after you are discharged, you can call the unit and asked to speak with the hospitalist on call if the hospitalist that took care of you is not available. Once you are discharged, your primary care physician will handle any further medical issues. Please note that NO REFILLS for any discharge medications will be authorized once you are discharged, as it is imperative that you return to your primary care physician (or establish a relationship with a primary care physician if you do not have one) for your aftercare needs so that they can reassess your need for medications and monitor your lab values.  Discharge Instructions    Call MD for:  difficulty breathing, headache or visual disturbances   Complete by:  As directed    Call MD for:  extreme fatigue   Complete by:  As directed    Call MD for:  hives   Complete by:  As directed  Call MD for:  persistant dizziness or light-headedness   Complete by:  As directed    Call MD for:  persistant nausea and vomiting   Complete by:  As directed    Call MD for:  redness, tenderness, or signs of infection (pain, swelling, redness, odor or green/yellow discharge around incision site)   Complete by:  As directed    Call MD for:  severe uncontrolled pain   Complete by:  As directed    Call MD for:  temperature >100.4   Complete by:  As directed    Diet - low sodium heart healthy    Complete by:  As directed    Increase activity slowly   Complete by:  As directed      Allergies as of 05/09/2017      Reactions   Amiodarone Nausea And Vomiting   Bactrim [sulfamethoxazole-trimethoprim] Other (See Comments)   Unknown reaction. Listed on MAR   Penicillins Hives, Swelling, Rash      Medication List    STOP taking these medications   ELIQUIS 5 MG Tabs tablet Generic drug:  apixaban     TAKE these medications   acetaminophen 500 MG tablet Commonly known as:  TYLENOL Take 500 mg by mouth every 6 (six) hours as needed for fever. What changed:  Another medication with the same name was changed. Make sure you understand how and when to take each.   acetaminophen 500 MG tablet Commonly known as:  TYLENOL Take 1 tablet (500 mg total) by mouth every 6 (six) hours as needed. What changed:    how much to take  when to take this   albuterol 0.63 MG/3ML nebulizer solution Commonly known as:  ACCUNEB Take 1 ampule by nebulization every 8 (eight) hours as needed for wheezing.   divalproex 250 MG DR tablet Commonly known as:  DEPAKOTE TAKE (1) TABLET BY MOUTH TWICE DAILY.   finasteride 5 MG tablet Commonly known as:  PROSCAR Take 5 mg by mouth daily.   Fluticasone-Salmeterol 250-50 MCG/DOSE Aepb Commonly known as:  ADVAIR Inhale 1 puff into the lungs 2 (two) times daily.   furosemide 20 MG tablet Commonly known as:  LASIX Take 1 tablet (20 mg total) by mouth daily.   lactulose 10 GM/15ML solution Commonly known as:  CHRONULAC Take 20 g by mouth 2 (two) times daily.   mineral oil liquid Place 0.05 mLs in ear(s) once a week. On Thursday   multivitamin with minerals tablet Take 1 tablet by mouth daily.   nitroGLYCERIN 0.4 MG SL tablet Commonly known as:  NITROSTAT Place 0.4 mg under the tongue every 5 (five) minutes as needed for chest pain.   potassium chloride SA 20 MEQ tablet Commonly known as:  K-DUR,KLOR-CON take 1 tablet by mouth once  daily What changed:    how much to take  how to take this  when to take this   rosuvastatin 10 MG tablet Commonly known as:  CRESTOR Take 10 mg by mouth daily.   SENNA-S 8.6-50 MG tablet Generic drug:  senna-docusate Take 1 tablet by mouth as needed for mild constipation.   sertraline 100 MG tablet Commonly known as:  ZOLOFT Take 100 mg by mouth daily.   sotalol 80 MG tablet Commonly known as:  BETAPACE take 1 tablet by mouth every 12 hours What changed:    how much to take  how to take this  when to take this   tamsulosin 0.4 MG Caps capsule Commonly known  asCaprice Kluver Take 1 capsule by mouth daily.   temazepam 15 MG capsule Commonly known as:  RESTORIL Take 15 mg by mouth at bedtime.      Follow-up Information    [place].   Specialty:  Assisted Living Facility Contact information: [place] [address] [phone]          Allergies  Allergen Reactions  . Amiodarone Nausea And Vomiting  . Bactrim [Sulfamethoxazole-Trimethoprim] Other (See Comments)    Unknown reaction. Listed on MAR  . Penicillins Hives, Swelling and Rash    Consultations:  Neurosurgery    Procedures/Studies: Dg Chest 2 View  Result Date: 05/08/2017 CLINICAL DATA:  84 year old male with fall. EXAM: CHEST  2 VIEW COMPARISON:  Chest radiograph dated 12/23/2016 FINDINGS: There is diffuse interstitial coarsening, chronic. No focal consolidation, pleural effusion, or pneumothorax. Stable borderline enlarged cardiac silhouette. There is atherosclerotic calcification of the aortic arch. Left pectoral pacemaker device there is osteopenia with degenerative changes of the spine. No acute osseous pathology IMPRESSION: No acute cardiopulmonary process.  No interval change. Electronically Signed   By: Anner Crete M.D.   On: 05/08/2017 04:13   Ct Head Wo Contrast  Result Date: 05/09/2017 CLINICAL DATA:  Fall with extra-axial hemorrhage  EXAM: CT HEAD WITHOUT CONTRAST TECHNIQUE: Contiguous axial images were obtained from the base of the skull through the vertex without intravenous contrast. COMPARISON:  CT head without contrast 05/08/2017. FINDINGS: Brain: The left frontal extra-axial hemorrhages now hypodense compared to the prior study. Overall size is stable, measuring 5.5 mm on coronal imaging. Subarachnoid or likely subdural blood products are again noted along the tentorium on the left. No new hemorrhage is present. 2 mm of left-to-right midline shift is stable. Mild diffuse white matter disease is unchanged. Vascular: Atherosclerotic calcifications are present in the cavernous internal carotid arteries without a hyperdense vessel. Skull: Calvarium is intact. No focal lytic or blastic lesions are present. Sinuses/Orbits: Mild mucosal thickening is present in the frontal sinuses bilaterally. The remaining paranasal sinuses are clear. There fluid in left mastoid air cells and middle ear cavity. No obstructing nasopharyngeal lesion is present. IMPRESSION: 1. Expected evolution of subdural hemorrhage over the left frontal convexity, now hypodense to cortex. 2. Layering hyperdense blood along the left tentorium and posterior inter cerebral falx. 3. No new hemorrhage. 4. Stable mass effect. 5. Stable chronic white matter disease. Electronically Signed   By: San Morelle M.D.   On: 05/09/2017 08:45   Ct Head Wo Contrast  Result Date: 05/08/2017 CLINICAL DATA:  Fall EXAM: CT HEAD WITHOUT CONTRAST CT MAXILLOFACIAL WITHOUT CONTRAST TECHNIQUE: Multidetector CT imaging of the head and maxillofacial structures were performed using the standard protocol without intravenous contrast. Multiplanar CT image reconstructions of the maxillofacial structures were also generated. COMPARISON:  None. FINDINGS: CT HEAD FINDINGS Brain: There is a small left convexity acute subdural hematoma that measures 5 mm in thickness. There is no associated mass effect.  There is also subdural blood layering along the left leaflet of the tentorium cerebelli. No intraparenchymal hemorrhage. No hydrocephalus. There is periventricular hypoattenuation compatible with chronic microvascular disease. Vascular: No hyperdense vessel or unexpected calcification. Skull: No calvarial fracture. Normal skull base. CT MAXILLOFACIAL FINDINGS Osseous: --Complex facial fracture types: No LeFort, zygomaticomaxillary complex or nasoorbitoethmoidal fracture. --Simple fracture types: None. --Mandible, hard palate and teeth: No acute abnormality. Orbits: The globes are intact. Normal appearance of the intra- and extraconal fat. Symmetric extraocular muscles. Sinuses: Left mastoid and  middle ear effusion. Small amount of right mastoid fluid. No paranasal sinus fluid levels or hemosinus. Soft tissues: Right facial hematoma. IMPRESSION: 1. Small left convexity and left tentorial subdural hematoma measuring 5 mm in thickness without associated midline shift or other mass effect. 2. No skull or facial fracture. 3. Small right facial hematoma. 4. Left mastoid and middle ear effusion. No associated temporal bone fracture is visible, but clinical/otoscopic assessment for hemotympanum or mastoid tenderness is suggested. Critical Value/emergent results were called by telephone at the time of interpretation on 05/08/2017 at 4:12 am to Dr. Ripley Fraise , who verbally acknowledged these results. Electronically Signed   By: Ulyses Jarred M.D.   On: 05/08/2017 04:16   Ct Maxillofacial Wo Contrast  Result Date: 05/08/2017 CLINICAL DATA:  Fall EXAM: CT HEAD WITHOUT CONTRAST CT MAXILLOFACIAL WITHOUT CONTRAST TECHNIQUE: Multidetector CT imaging of the head and maxillofacial structures were performed using the standard protocol without intravenous contrast. Multiplanar CT image reconstructions of the maxillofacial structures were also generated. COMPARISON:  None. FINDINGS: CT HEAD FINDINGS Brain: There is a small left  convexity acute subdural hematoma that measures 5 mm in thickness. There is no associated mass effect. There is also subdural blood layering along the left leaflet of the tentorium cerebelli. No intraparenchymal hemorrhage. No hydrocephalus. There is periventricular hypoattenuation compatible with chronic microvascular disease. Vascular: No hyperdense vessel or unexpected calcification. Skull: No calvarial fracture. Normal skull base. CT MAXILLOFACIAL FINDINGS Osseous: --Complex facial fracture types: No LeFort, zygomaticomaxillary complex or nasoorbitoethmoidal fracture. --Simple fracture types: None. --Mandible, hard palate and teeth: No acute abnormality. Orbits: The globes are intact. Normal appearance of the intra- and extraconal fat. Symmetric extraocular muscles. Sinuses: Left mastoid and middle ear effusion. Small amount of right mastoid fluid. No paranasal sinus fluid levels or hemosinus. Soft tissues: Right facial hematoma. IMPRESSION: 1. Small left convexity and left tentorial subdural hematoma measuring 5 mm in thickness without associated midline shift or other mass effect. 2. No skull or facial fracture. 3. Small right facial hematoma. 4. Left mastoid and middle ear effusion. No associated temporal bone fracture is visible, but clinical/otoscopic assessment for hemotympanum or mastoid tenderness is suggested. Critical Value/emergent results were called by telephone at the time of interpretation on 05/08/2017 at 4:12 am to Dr. Ripley Fraise , who verbally acknowledged these results. Electronically Signed   By: Ulyses Jarred M.D.   On: 05/08/2017 04:16    Discharge Exam: Vitals:   05/09/17 0700 05/09/17 1100  BP:    Pulse:    Resp:    Temp: (!) 97.5 F (36.4 C) (!) 97.2 F (36.2 C)  SpO2:     Vitals:   05/09/17 0500 05/09/17 0600 05/09/17 0700 05/09/17 1100  BP: (!) 88/59 (!) 104/54    Pulse: 64 68    Resp:      Temp:   (!) 97.5 F (36.4 C) (!) 97.2 F (36.2 C)  TempSrc:   Oral  Axillary  SpO2: (!) 86% 99%    Weight:      Height:        General: Pt is alert, awake, not in acute distress Cardiovascular: RRR, S1/S2 +, no rubs, no gallops Respiratory: CTA bilaterally, no wheezing, no rhonchi Abdominal: Soft, NT, ND, bowel sounds + Extremities: no edema, no cyanosis   The results of significant diagnostics from this hospitalization (including imaging, microbiology, ancillary and laboratory) are listed below for reference.     Microbiology: Recent Results (from the past 240 hour(s))  MRSA PCR  Screening     Status: None   Collection Time: 05/08/17  5:50 PM  Result Value Ref Range Status   MRSA by PCR NEGATIVE NEGATIVE Final    Comment:        The GeneXpert MRSA Assay (FDA approved for NASAL specimens only), is one component of a comprehensive MRSA colonization surveillance program. It is not intended to diagnose MRSA infection nor to guide or monitor treatment for MRSA infections.      Labs: BNP (last 3 results) Recent Labs    08/08/16 1935  BNP 854.6*   Basic Metabolic Panel: Recent Labs  Lab 05/08/17 0320 05/09/17 0631  NA 135 136  K 4.0 3.8  CL 102 104  CO2 28 27  GLUCOSE 105* 90  BUN 20 12  CREATININE 0.59* 0.45*  CALCIUM 8.2* 8.0*   Liver Function Tests: No results for input(s): AST, ALT, ALKPHOS, BILITOT, PROT, ALBUMIN in the last 168 hours. No results for input(s): LIPASE, AMYLASE in the last 168 hours. No results for input(s): AMMONIA in the last 168 hours. CBC: Recent Labs  Lab 05/08/17 0320 05/09/17 0631  WBC 8.3 7.0  NEUTROABS 3.3  --   HGB 8.9* 8.5*  HCT 28.2* 27.1*  MCV 91.3 91.9  PLT 255 238   Cardiac Enzymes: No results for input(s): CKTOTAL, CKMB, CKMBINDEX, TROPONINI in the last 168 hours. BNP: Invalid input(s): POCBNP CBG: No results for input(s): GLUCAP in the last 168 hours. D-Dimer No results for input(s): DDIMER in the last 72 hours. Hgb A1c No results for input(s): HGBA1C in the last 72  hours. Lipid Profile No results for input(s): CHOL, HDL, LDLCALC, TRIG, CHOLHDL, LDLDIRECT in the last 72 hours. Thyroid function studies No results for input(s): TSH, T4TOTAL, T3FREE, THYROIDAB in the last 72 hours.  Invalid input(s): FREET3 Anemia work up No results for input(s): VITAMINB12, FOLATE, FERRITIN, TIBC, IRON, RETICCTPCT in the last 72 hours. Urinalysis    Component Value Date/Time   COLORURINE YELLOW 05/08/2017 0628   APPEARANCEUR CLEAR 05/08/2017 0628   LABSPEC 1.020 05/08/2017 0628   PHURINE 6.0 05/08/2017 0628   GLUCOSEU NEGATIVE 05/08/2017 0628   HGBUR NEGATIVE 05/08/2017 0628   BILIRUBINUR NEGATIVE 05/08/2017 0628   KETONESUR NEGATIVE 05/08/2017 0628   PROTEINUR NEGATIVE 05/08/2017 0628   NITRITE NEGATIVE 05/08/2017 0628   LEUKOCYTESUR NEGATIVE 05/08/2017 2703   Sepsis Labs Invalid input(s): PROCALCITONIN,  WBC,  LACTICIDVEN Microbiology Recent Results (from the past 240 hour(s))  MRSA PCR Screening     Status: None   Collection Time: 05/08/17  5:50 PM  Result Value Ref Range Status   MRSA by PCR NEGATIVE NEGATIVE Final    Comment:        The GeneXpert MRSA Assay (FDA approved for NASAL specimens only), is one component of a comprehensive MRSA colonization surveillance program. It is not intended to diagnose MRSA infection nor to guide or monitor treatment for MRSA infections.     Time coordinating discharge: 25 minutes  SIGNED:  Chipper Oman, MD  [facility] 05/09/2017, 4:24 PM  Pager please text page via  [email or web address]

## 2017-07-21 ENCOUNTER — Ambulatory Visit: Payer: PPO | Admitting: Nurse Practitioner

## 2017-08-21 ENCOUNTER — Encounter: Payer: Medicare Other | Admitting: Hospice and Palliative Medicine

## 2017-08-24 ENCOUNTER — Non-Acute Institutional Stay: Payer: Medicare Other | Admitting: Hospice and Palliative Medicine

## 2017-08-24 VITALS — BP 116/68 | HR 68 | Wt 141.2 lb

## 2017-08-24 DIAGNOSIS — Z515 Encounter for palliative care: Secondary | ICD-10-CM | POA: Insufficient documentation

## 2017-08-24 NOTE — Progress Notes (Signed)
PALLIATIVE CARE CONSULT VISIT   PATIENT NAME: Steve Norris DOB: [birth year] MRN: [id number]  PRIMARY CARE PROVIDER: Lonie Peak Deboraha Sprang, NP (Onsite)  REFERRING PROVIDER: Lonie Peak [facility] [address] [place], [place] [address]  RESPONSIBLE PARTY:   Sons Orvell Careaga [phone] and Nicole Kindred   RECOMMENDATIONS and PLAN:  1.Weakness: secondary to advanced age with Alzheimer's dementia approx 6C on FAST scale, hx of CAD, hx of seizure disorder, COPD and generalized frailty. He had a fall last month with L1 compression fx. He has declined since this fall. He has had significant appetite loss and weight loss. He sleeps most of the day. Would recommend the following: PT if able. He has completed this previously. Discuss goals of care with sons.Marland KitchenMarland Kitchenperhaps appetite stimulant, perhaps Hospice care. 2. ACP: DNR status on chart. Called both sons. Scheduled a meeting to discuss goals of care.   I spent 40 minutes providing this consultation,  from 10am to 10:40 am. More than 50% of the time in this consultation was spent coordinating communication.   HISTORY OF PRESENT ILLNESS:  Steve Norris is a 84 y.o.  male with multiple medical problems including mild dementia, CAD, COPD, generalized weakness with recent fall last month with compression fracture as well as others. Palliative Care was asked to help address any symptom management needs and goals of care.   CODE STATUS: DNR  PPS: 40% HOSPICE ELIGIBILITY/DIAGNOSIS: TBD  PAST MEDICAL HISTORY:  Past Medical History:  Diagnosis Date  . Arthritis   . Asthma   . Barrett's esophagus 03-19-2009   EGD  . CAD (coronary artery disease)    a. s/p stent placement in 2006 to the LAD and RCA  . COPD (chronic obstructive pulmonary disease) ([place])   . Dementia   . Diverticulosis 03-19-2009   Colonoscopy   . Erosive esophagitis 03-19-2009   EGD  . GERD (gastroesophageal reflux disease)   . Hiatal hernia 03-19-2009   EGD    . Hyperlipidemia   . Hypertension   . Internal hemorrhoids 03-19-2009   Colonoscopy  . Paroxysmal atrial fibrillation (HCC)   . Sinus node dysfunction (HCC)    a. s/p PPM placement in 2011  . Subdural hematoma ([place]) 05/08/2017   small, left/notes 05/08/2017    SOCIAL HX:  Social History   Tobacco Use  . Smoking status: Former Research scientist (life sciences)  . Smokeless tobacco: Never Used  . Tobacco comment: Quit smoking 1996  Substance Use Topics  . Alcohol use: No    ALLERGIES:  Allergies  Allergen Reactions  . Amiodarone Nausea And Vomiting  . Bactrim [Sulfamethoxazole-Trimethoprim] Other (See Comments)    Unknown reaction. Listed on MAR  . Penicillins Hives, Swelling and Rash     PERTINENT MEDICATIONS:  Outpatient Encounter Medications as of 08/24/2017  Medication Sig  . acetaminophen (TYLENOL) 500 MG tablet Take 1 tablet (500 mg total) by mouth every 6 (six) hours as needed. (Patient taking differently: Take 1,000 mg by mouth at bedtime. )  . acetaminophen (TYLENOL) 500 MG tablet Take 500 mg by mouth every 6 (six) hours as needed for fever.  Marland Kitchen albuterol (ACCUNEB) 0.63 MG/3ML nebulizer solution Take 1 ampule by nebulization every 8 (eight) hours as needed for wheezing.  . divalproex (DEPAKOTE) 250 MG DR tablet TAKE (1) TABLET BY MOUTH TWICE DAILY.  . finasteride (PROSCAR) 5 MG tablet Take 5 mg by mouth daily.   . Fluticasone-Salmeterol (ADVAIR) 250-50 MCG/DOSE AEPB Inhale 1 puff into the lungs 2 (two) times  daily.  . furosemide (LASIX) 20 MG tablet Take 1 tablet (20 mg total) by mouth daily.  Marland Kitchen lactulose (CHRONULAC) 10 GM/15ML solution Take 20 g by mouth 2 (two) times daily.  . mineral oil liquid Place 0.05 mLs in ear(s) once a week. On Thursday  . Multiple Vitamins-Minerals (MULTIVITAMIN WITH MINERALS) tablet Take 1 tablet by mouth daily.  . nitroGLYCERIN (NITROSTAT) 0.4 MG SL tablet Place 0.4 mg under the tongue every 5 (five) minutes as needed for chest pain.   . potassium chloride SA  (K-DUR,KLOR-CON) 20 MEQ tablet take 1 tablet by mouth once daily (Patient taking differently: 20 mEq by mouth daily)  . rosuvastatin (CRESTOR) 10 MG tablet Take 10 mg by mouth daily.  Marland Kitchen senna-docusate (SENNA-S) 8.6-50 MG per tablet Take 1 tablet by mouth as needed for mild constipation.   . sertraline (ZOLOFT) 100 MG tablet Take 100 mg by mouth daily.  . sotalol (BETAPACE) 80 MG tablet take 1 tablet by mouth every 12 hours (Patient taking differently: 80 mg oral twice daily)  . tamsulosin (FLOMAX) 0.4 MG CAPS capsule Take 1 capsule by mouth daily.  . temazepam (RESTORIL) 15 MG capsule Take 15 mg by mouth at bedtime.   No facility-administered encounter medications on file as of 08/24/2017.     PHYSICAL EXAM:   General: appears elderly and frail Cardiovascular: PM; RRR Pulmonary: diminished in bases; shallow breaths Abdomen: soft; active BS; epigastric pain mild Extremities: thin, long. No edema;  Skin: thin and fragile Neurological: awakens easily; answers simple questions; unsteady gait; + generalized weakness; mild memory loss  Nathanial Rancher, NP

## 2017-08-27 ENCOUNTER — Non-Acute Institutional Stay: Payer: Medicare Other | Admitting: Hospice and Palliative Medicine

## 2017-08-27 VITALS — Wt 137.8 lb

## 2017-08-27 DIAGNOSIS — Z515 Encounter for palliative care: Secondary | ICD-10-CM

## 2017-08-27 NOTE — Progress Notes (Signed)
FOLLOW UP PALLIATIVE CARE CONSULT VISIT   PATIENT NAME: Steve Norris DOB: [birth year] MRN: [id number]  REFERRING PROVIDER: Lonie Peak [facility] [address] [place], [place] [address]   RESPONSIBLE PARTY:  Acct ID - [place] Phone Work Phone Relationship Acct Type  [id number] Steve Norris, [name] [phone]  Self P/F     [address] [place], [place] [address]     BILLABLE ICD-10: Weakness R53.1     RECOMMENDATIONS and PLAN:  1.  Weakness: secondary to advanced age with recent falls and lumbar compression fracture and significant weight loss. Continue close observation in Memory Care unit. Encourage getting out of bed for activity and meals. PT has been exhausted. Discussed palliative care vs hospice. Continue to monitor from palliative care perspective. 2.  Weight loss...one year ago, he weighed 160#...now down to 137.8#. Increasing protein intake. Discussed foods that he enjoys, ie eggs, sausage, Greek yogurt, peanut butter...adding protein to vanilla ice cream...offer protein shakes. Getting dental work completed/dentures. 3. Pain: secondary to recent falls/compression fracture and arthritis. Scheduling Tylenol 500 mg PO TID.  4.ACP: met with both sons. Discussed goals of care at length. DNR status. Desires hospitalizations for acute conditions. No feeding tube. No ventilator.    I spent 60 minutes providing this consultation,  from 4:00pm to 5:00 pm.. More than 50% of the time in this consultation was spent discussing goals of care with sons and staff and coordinating communication.    HISTORY OF PRESENT ILLNESS:  Today's meeting scheduled with both sons to discuss symptom management and goals of care. Completed MOST form. Discussed disease trajectory and hospice vs palliative care.  CODE STATUS: DNR  ADVANCED DIRECTIVES: Y  PPS: weak 50% HOSPICE ELIGIBILITY/DIAGNOSIS: Protein calorie malnutrition when goals of care are comfort measures    PHYSICAL EXAM:   GEN: elderly,  thin, tired LUNGS: Diminished in bases; shallow CARDIAC:  RRR EXTREMITIES: Thin; using all four; weak legs on walking SKIN: thin and fragile  NEURO: +generalized weakness; follows commands; [place], NP

## 2017-10-05 ENCOUNTER — Emergency Department (HOSPITAL_COMMUNITY)
Admission: EM | Admit: 2017-10-05 | Discharge: 2017-10-06 | Disposition: A | Discharge status: Home / Self Care | Payer: Medicare Other | Attending: Emergency Medicine | Admitting: Emergency Medicine

## 2017-10-05 DIAGNOSIS — Z7901 Long term (current) use of anticoagulants: Secondary | ICD-10-CM | POA: Diagnosis not present

## 2017-10-05 DIAGNOSIS — I5032 Chronic diastolic (congestive) heart failure: Secondary | ICD-10-CM | POA: Insufficient documentation

## 2017-10-05 DIAGNOSIS — Z79899 Other long term (current) drug therapy: Secondary | ICD-10-CM | POA: Insufficient documentation

## 2017-10-05 DIAGNOSIS — Z87891 Personal history of nicotine dependence: Secondary | ICD-10-CM | POA: Insufficient documentation

## 2017-10-05 DIAGNOSIS — I2510 Atherosclerotic heart disease of native coronary artery without angina pectoris: Secondary | ICD-10-CM | POA: Insufficient documentation

## 2017-10-05 DIAGNOSIS — J449 Chronic obstructive pulmonary disease, unspecified: Secondary | ICD-10-CM | POA: Diagnosis not present

## 2017-10-05 DIAGNOSIS — F0390 Unspecified dementia without behavioral disturbance: Secondary | ICD-10-CM | POA: Insufficient documentation

## 2017-10-05 DIAGNOSIS — I110 Hypertensive heart disease with heart failure: Secondary | ICD-10-CM | POA: Diagnosis not present

## 2017-10-05 DIAGNOSIS — Z950 Presence of cardiac pacemaker: Secondary | ICD-10-CM | POA: Insufficient documentation

## 2017-10-05 DIAGNOSIS — K068 Other specified disorders of gingiva and edentulous alveolar ridge: Secondary | ICD-10-CM | POA: Insufficient documentation

## 2017-10-06 ENCOUNTER — Other Ambulatory Visit: Payer: Self-pay

## 2017-10-06 LAB — CBC WITH DIFFERENTIAL/PLATELET
BASOS ABS: 0.1 10*3/uL (ref 0.0–0.1)
Basophils Relative: 1 %
Eosinophils Absolute: 0.4 10*3/uL (ref 0.0–0.7)
Eosinophils Relative: 6 %
HEMATOCRIT: 25.2 % — AB (ref 39.0–52.0)
HEMOGLOBIN: 8 g/dL — AB (ref 13.0–17.0)
LYMPHS ABS: 3.2 10*3/uL (ref 0.7–4.0)
LYMPHS PCT: 40 %
MCH: 29.5 pg (ref 26.0–34.0)
MCHC: 31.7 g/dL (ref 30.0–36.0)
MCV: 93 fL (ref 78.0–100.0)
Monocytes Absolute: 1.4 10*3/uL — ABNORMAL HIGH (ref 0.1–1.0)
Monocytes Relative: 18 %
NEUTROS ABS: 2.8 10*3/uL (ref 1.7–7.7)
Neutrophils Relative %: 35 %
Platelets: 303 10*3/uL (ref 150–400)
RBC: 2.71 MIL/uL — AB (ref 4.22–5.81)
RDW: 20.4 % — ABNORMAL HIGH (ref 11.5–15.5)
WBC: 7.9 10*3/uL (ref 4.0–10.5)

## 2017-10-06 LAB — PROTIME-INR
INR: 1.35
Prothrombin Time: 16.5 seconds — ABNORMAL HIGH (ref 11.4–15.2)

## 2017-10-06 MED ORDER — "TRANEXAMIC ACID 5% ORAL SOLUTION "
10.0000 mL | Freq: Once | ORAL | Status: AC
  Administered 2017-10-06: 10 mL via OROMUCOSAL
  Filled 2017-10-06: qty 10

## 2017-10-06 MED ORDER — ACETAMINOPHEN 325 MG PO TABS
650.0000 mg | ORAL_TABLET | Freq: Once | ORAL | Status: AC
  Administered 2017-10-06: 650 mg via ORAL
  Filled 2017-10-06: qty 2

## 2017-10-06 NOTE — ED Provider Notes (Signed)
[facility] DEPT Provider Note   CSN: [id number] Arrival date & time: 10/05/17  2357     History   Chief Complaint Chief Complaint  Patient presents with  . Coagulation Disorder  . Hypotension    HPI Steve Norris is a 84 y.o. male.  HPI   This is an 84 year old male with a history of coronary artery disease, dementia, hypertension, hyperlipidemia, paroxysmal atrial fibrillation who presents with gum bleeding.  Patient reports that he had a teeth removed today.  He has had persistent bleeding all day long.  He denies any significant pain.  Eliquis and was told to continue Eliquis.  He denies any history of bleeding disorders.  EMS noted that he was hypotensive and patient received fluids in route.  Patient denies any dizziness, shortness of breath, chest pain.  Past Medical History:  Diagnosis Date  . Arthritis   . Asthma   . Barrett's esophagus 03-19-2009   EGD  . CAD (coronary artery disease)    a. s/p stent placement in 2006 to the LAD and RCA  . COPD (chronic obstructive pulmonary disease) ([place])   . Dementia   . Diverticulosis 03-19-2009   Colonoscopy   . Erosive esophagitis 03-19-2009   EGD  . GERD (gastroesophageal reflux disease)   . Hiatal hernia 03-19-2009   EGD  . Hyperlipidemia   . Hypertension   . Internal hemorrhoids 03-19-2009   Colonoscopy  . Paroxysmal atrial fibrillation (HCC)   . Sinus node dysfunction (HCC)    a. s/p PPM placement in 2011  . Subdural hematoma ([place]) 05/08/2017   small, left/notes 05/08/2017    Patient Active Problem List   Diagnosis Date Noted  . Palliative care encounter 08/24/2017  . Subdural hematoma ([place]) 05/08/2017  . New onset seizure ([place]) 12/23/2016  . Acute left otitis media 12/23/2016  . Acute encephalopathy 12/23/2016  . PAF (paroxysmal atrial fibrillation) ([place]) 12/23/2016  . Benign essential HTN 12/23/2016  . Chronic diastolic heart failure, NYHA class 2 ([place]) 12/23/2016  .  Hyperlipidemia 12/23/2016  . Dementia 12/23/2016  . Asthma 12/23/2016  . Elevated AST (SGOT) 12/23/2016  . Acute diastolic CHF (congestive heart failure) ([place])   . Pressure injury of skin 08/09/2016  . BPH (benign prostatic hyperplasia) 08/09/2016  . HTN (hypertension) 08/09/2016  . CHF exacerbation ([place]) 08/08/2016  . Anemia 03/25/2016  . SSS (sick sinus syndrome) ([place]) 03/25/2016  . CAP (community acquired pneumonia)   . Atrial fibrillation with rapid ventricular response ([place]) 11/04/2014  . Dyspnea 12/15/2012  . Long term current use of anticoagulant therapy 07/14/2012  . COPD (chronic obstructive pulmonary disease) ([place]) 02/23/2012  . CAD (coronary artery disease) 02/23/2012  . S/P placement of cardiac pacemaker 02/23/2012  . Atrial fibrillation ([place]) 02/23/2012  . Barrett's esophagus 04/30/2009    Past Surgical History:  Procedure Laterality Date  . CARDIAC CATHETERIZATION  02/12/2009   mild ostial left main disease,patent LAD & RCA stents   . CARDIOVERSION N/A 11/06/2014   Procedure: CARDIOVERSION;  Surgeon: [name], MD;  Location: [facility] ENDOSCOPY;  Service: Cardiovascular;  Laterality: N/A;  . CARDIOVERSION N/A 08/02/2015   Procedure: CARDIOVERSION;  Surgeon: Sueanne Margarita, MD;  Location: [facility] ENDOSCOPY;  Service: Cardiovascular;  Laterality: N/A;  . CATARACT EXTRACTION    . COLONOSCOPY    . CORONARY ANGIOPLASTY WITH STENT PLACEMENT  2006   Right Coronary Artery Cypher stents placed 2006  . HEMORRHOID SURGERY    . INSERT / REPLACE / REMOVE PACEMAKER  07/25/2009   [employer] Accent  . LEFT HEART CATHETERIZATION WITH CORONARY ANGIOGRAM N/A 07/01/2013   Procedure: LEFT HEART CATHETERIZATION WITH CORONARY ANGIOGRAM;  Surgeon: Sanda Klein, MD;  Location: [facility] CATH LAB;  Service: Cardiovascular;  Laterality: N/A;  . NM MYOCAR PERF WALL MOTION  08/14/2008   no significant ischemia EF 64%  . TEE WITHOUT CARDIOVERSION N/A 08/02/2015   Procedure: TRANSESOPHAGEAL ECHOCARDIOGRAM (TEE);   Surgeon: Sueanne Margarita, MD;  Location: [facility] ENDOSCOPY;  Service: Cardiovascular;  Laterality: N/A;  . TONSILLECTOMY    . US ECHOCARDIOGRAPHY  07/12/2009   borderline LA enlargement,mild mitral annular ca+, AOV mildly sclerotic,trace AI.  Marland Kitchen VASECTOMY          Home Medications    Prior to Admission medications   Medication Sig Start Date End Date Taking? Authorizing Provider  acetaminophen (TYLENOL) 500 MG tablet Take 1 tablet (500 mg total) by mouth every 6 (six) hours as needed. Patient taking differently: Take 1,000 mg by mouth 2 (two) times daily.  12/27/16  Yes Domenic Moras, PA-C  acetaminophen (TYLENOL) 500 MG tablet Take 500 mg by mouth every 6 (six) hours as needed for fever.   Yes [provider]  albuterol (ACCUNEB) 0.63 MG/3ML nebulizer solution Take 1 ampule by nebulization every 8 (eight) hours as needed for wheezing.   Yes [provider]  apixaban (ELIQUIS) 5 MG TABS tablet Take 5 mg by mouth 2 (two) times daily.   Yes [provider]  divalproex (DEPAKOTE) 250 MG DR tablet TAKE (1) TABLET BY MOUTH TWICE DAILY. 02/23/17  Yes Garvin Fila, MD  finasteride (PROSCAR) 5 MG tablet Take 5 mg by mouth daily.  02/17/12  Yes [provider]  Fluticasone-Salmeterol (ADVAIR) 250-50 MCG/DOSE AEPB Inhale 1 puff into the lungs 2 (two) times daily.   Yes [provider]  furosemide (LASIX) 20 MG tablet Take 1 tablet (20 mg total) by mouth daily. Patient taking differently: Take 10 mg by mouth daily.  01/10/16  Yes Croitoru, Mihai, MD  lactulose (CHRONULAC) 10 GM/15ML solution Take 10 g by mouth 2 (two) times daily.    Yes [provider]  mineral oil liquid Place 0.05 mLs in ear(s) once a week. On Thursday   Yes [provider]  Multiple Vitamins-Minerals (MULTIVITAMIN WITH MINERALS) tablet Take 1 tablet by mouth daily.   Yes [provider]  nitroGLYCERIN (NITROSTAT) 0.4 MG SL tablet Place 0.4 mg under the tongue every 5  (five) minutes as needed for chest pain.    Yes [provider]  potassium chloride SA (K-DUR,KLOR-CON) 20 MEQ tablet take 1 tablet by mouth once daily Patient taking differently: 20 mEq by mouth daily 12/18/15  Yes Croitoru, Mihai, MD  rosuvastatin (CRESTOR) 10 MG tablet Take 10 mg by mouth daily.   Yes [provider]  senna-docusate (SENNA-S) 8.6-50 MG per tablet Take 1 tablet by mouth as needed for mild constipation.    Yes [provider]  sertraline (ZOLOFT) 100 MG tablet Take 100 mg by mouth daily.   Yes [provider]  Skin Protectants, Misc. (MINERIN) CREA Apply 1 application topically at bedtime. To back   Yes [provider]  sotalol (BETAPACE) 80 MG tablet take 1 tablet by mouth every 12 hours Patient taking differently: 80 mg oral twice daily 02/11/16  Yes Croitoru, Mihai, MD  tamsulosin (FLOMAX) 0.4 MG CAPS capsule Take 1 capsule by mouth daily. 12/27/15  Yes [provider]  temazepam (RESTORIL) 15 MG capsule Take  15 mg by mouth at bedtime.   Yes [provider]  traMADol (ULTRAM) 50 MG tablet Take 25 mg by mouth every 12 (twelve) hours as needed (pain).   Yes [provider]  [place] Protein POWD Take 1 Scoop by mouth 2 (two) times daily.   Yes [provider]    Family History Family History  Problem Relation Age of Onset  . Heart disease Mother   . Heart disease Father   . Colon cancer Neg Hx     Social History Social History   Tobacco Use  . Smoking status: Former Research scientist (life sciences)  . Smokeless tobacco: Never Used  . Tobacco comment: Quit smoking 1996  Substance Use Topics  . Alcohol use: No  . Drug use: No     Allergies   Amiodarone; Bactrim [sulfamethoxazole-trimethoprim]; and Penicillins   Review of Systems Review of Systems  Constitutional: Negative for fever.  HENT:       Gum bleeding  Respiratory: Negative for shortness of breath.   Cardiovascular: Negative for chest pain.    Gastrointestinal: Negative for nausea and vomiting.  Neurological: Negative for dizziness.  All other systems reviewed and are negative.    Physical Exam Updated Vital Signs BP 114/66 (BP Location: Right Arm)   Pulse 62   Resp 16   Ht 5\' 10"  (1.778 m)   Wt 81.6 kg (180 lb)   SpO2 94%   BMI 25.83 kg/m   Physical Exam  Constitutional: He is oriented to person, place, and time.  Elderly appearing but nontoxic, no acute distress  HENT:  Head: Normocephalic and atraumatic.  Bleeding noted with clots over the right lower gumline as well as 2 areas over the upper gumline, oozing persistent  Eyes: Pupils are equal, round, and reactive to light.  Cardiovascular: Normal rate, regular rhythm and normal heart sounds.  No murmur heard. Pulmonary/Chest: Effort normal and breath sounds normal. No respiratory distress. He has no wheezes.  Abdominal: Soft. There is no tenderness.  Musculoskeletal: He exhibits no edema.  Neurological: He is alert and oriented to person, place, and time.  Skin: Skin is warm and dry.  Psychiatric: He has a normal mood and affect.  Nursing note and vitals reviewed.    ED Treatments / Results  Labs (all labs ordered are listed, but only abnormal results are displayed) Labs Reviewed  CBC WITH DIFFERENTIAL/PLATELET - Abnormal; Notable for the following components:      Result Value   RBC 2.71 (*)    Hemoglobin 8.0 (*)    HCT 25.2 (*)    RDW 20.4 (*)    Monocytes Absolute 1.4 (*)    All other components within normal limits  PROTIME-INR - Abnormal; Notable for the following components:   Prothrombin Time 16.5 (*)    All other components within normal limits    EKG None  Radiology No results found.  Procedures Procedures (including critical care time)  CRITICAL CARE Performed by: Merryl Hacker   Total critical care time: 35 minutes  Critical care time was exclusive of separately billable procedures and treating other  patients.  Critical care was necessary to treat or prevent imminent or life-threatening deterioration.  Critical care was time spent personally by me on the following activities: development of treatment plan with patient and/or surrogate as well as nursing, discussions with consultants, evaluation of patient's response to treatment, examination of patient, obtaining history from patient or surrogate, ordering and performing treatments and interventions, ordering and review of laboratory  studies, ordering and review of radiographic studies, pulse oximetry and re-evaluation of patient's condition.   Medications Ordered in ED Medications  tranexamic acid 5% oral solution for E.D. (has no administration in time range)  tranexamic acid 5% oral solution for E.D. (10 mLs Mouth/Throat Given 10/06/17 0051)  tranexamic acid 5% oral solution for E.D. (10 mLs Mouth/Throat Given 10/06/17 0221)  acetaminophen (TYLENOL) tablet 650 mg (650 mg Oral Given 10/06/17 0454)     Initial Impression / Assessment and Plan / ED Course  I have reviewed the triage vital signs and the nursing notes.  Pertinent labs & imaging results that were available during my care of the patient were reviewed by me and considered in my medical decision making (see chart for details).     Patient presents with bleeding from sites of recent teeth being pulled.  He is currently on anticoagulation.  This was not stopped.  He has 3 prominent bleeding sites.  Initially TXA which used to swish and the oral cavity.  This did not help.  Clots were evacuated and TXA soaked gauze was applied with direct pressure.  This temporized 2 of the sites with only minimal oozing left.  Unfortunately, patient had persistent clot and bleeding from his left upper incisor site.  Teabags were applied without any results.  A third round of TXA was used with 3 separate TXA soaked gauze.  On multiple rechecks, patient continued to have clot but it did appear to be  slowing down.  On last recheck at 7 AM, patient did not show any evidence of active bleeding.  His hemoglobin is 8.  Baseline 8.5.  I discussed at length with the son regarding his concerns for him to return to his living facility with ongoing bleeding.  Will continue to monitor in the emergency department for any rebleed.  He is at risk for rebleed given ongoing Eliquis.  Would recommend stopping Eliquis today.  Son and patient are aware of risk of stopping Eliquis.  Patient was signed out to Dr. Wilson Singer.  Final Clinical Impressions(s) / ED Diagnoses   Final diagnoses:  None    ED Discharge Orders    None       Aviyah Swetz, Barbette Hair, MD 10/06/17 [phone]

## 2017-10-06 NOTE — ED Notes (Signed)
Pt driven back to facility by his son.

## 2017-10-06 NOTE — Discharge Instructions (Addendum)
Do not take eliquis for the rest of the day. Resume tomorrow. Soft diet. Do not wear dentures for three days.  I expect you to have some mild bleeding through out the day today. Return to ER if it significantly worsens.

## 2017-10-06 NOTE — ED Triage Notes (Addendum)
Pt to ed Via [facility] from [facility] [place] with complaints of gums bleeding. Pt was at the dentist today where he had 8 teeth removed and dentures placed on top of extractions and glued in. GEMS states that when they got there" he had blood clots on every extraction and had multiple clots hanging from mouth and didn't know how he was even breathing" Facility stated that his gums had been bleeding all day. Pt is hypotensive 80/20 when [facility] arrived. Pt  Has hx of CHF and was given 200 ml in ambulance. Pt is on Eliquis but facility was not able to state whether he has been off it for the teeth surgery or not

## 2017-10-06 NOTE — ED Provider Notes (Signed)
Continued to observe pt a couple more hours beyond change of shift. Saliva is blood tinged but I can't say I see discrete bleeding/oozing at this point.   Advised to hold eliquis today. If he doesn't have significant bleeding for the rest of the day then I think it's fine to resume it tomorrow. Soft foods. No sucking through a straw. I would avoid using dentures for several days as well. Otherwise follow-up with dentist as previously recommended. Emergent return precautions discussed.    Virgel Manifold, MD 10/06/17 [phone]

## 2017-10-06 NOTE — ED Notes (Signed)
Pt ambulated in hallway for relief of hip/groin pain. Pt tolerated well with 2 point walker.

## 2017-10-19 ENCOUNTER — Non-Acute Institutional Stay: Payer: Medicare Other | Admitting: Hospice and Palliative Medicine

## 2017-10-19 DIAGNOSIS — Z515 Encounter for palliative care: Secondary | ICD-10-CM

## 2017-10-19 NOTE — Progress Notes (Signed)
PALLIATIVE CARE CONSULT VISIT   PATIENT NAME: Steve Norris DOB: 1929/09/12 MRN: [id number]  PRIMARY CARE PROVIDER: Meredeth Ide, NP Onsite  REFERRING PROVIDER: Lonie Peak [facility] [address] [place], [place] [address]  RESPONSIBLE PARTY:   sons   RECOMMENDATIONS and PLAN:  1.Weakness: secondary to advanced age, cardiac disease, protein calorie malnutrition, weight loss as well as others outlined below. Last visit I recommended protein calories in a shake. He absolutely loves the milkshakes made by staff. He does not want to go to lunch today, but he is drinking two shakes a day. He has also been to the dentist to have some teeth removed. He now has dentures to eat with. Uncertain if this is going to enable to him to eat better. Time will tell. He is still getting over the gum sensitivity from this.  2. High fall risk/bleeding: He continues on Eliquis 5 mg PO BID for Afib. It is my recommendation due to his advanced age, weight and renal function to DC this medication or at least decrease to 2.5 mg PO BID. He is a very high fall risk.  3. ACP: DNR form on chart. MOST form completed with both sons present on previous visit indicating hospitalizations for possible reversible conditions, IV fluids, IV abx but not feeding tube or ventilator.    I spent 15 minutes providing this consultation,  from 11:00 to 11:15am. More than 50% of the time in this consultation was spent assessing patient, interviewing staff and coordinating communication.    HISTORY OF PRESENT ILLNESS:  Steve Norris is an 84 y.o. male with multiple medical problems who resides in the memory care at [facility]. Palliative Care was asked to help address symptom management and  goals of care. Today's visit was routine to assess for any decline.  CODE STATUS: DNR  PPS: weak 40% HOSPICE ELIGIBILITY/DIAGNOSIS: TBD  PAST MEDICAL HISTORY:  Past Medical History:  Diagnosis Date  . Arthritis   . Asthma   . Barrett's  esophagus 03-19-2009   EGD  . CAD (coronary artery disease)    a. s/p stent placement in 2006 to the LAD and RCA  . COPD (chronic obstructive pulmonary disease) ([place])   . Dementia   . Diverticulosis 03-19-2009   Colonoscopy   . Erosive esophagitis 03-19-2009   EGD  . GERD (gastroesophageal reflux disease)   . Hiatal hernia 03-19-2009   EGD  . Hyperlipidemia   . Hypertension   . Internal hemorrhoids 03-19-2009   Colonoscopy  . Paroxysmal atrial fibrillation (HCC)   . Sinus node dysfunction (HCC)    a. s/p PPM placement in 2011  . Subdural hematoma ([place]) 05/08/2017   small, left/notes 05/08/2017    SOCIAL HX:  Social History   Tobacco Use  . Smoking status: Former Research scientist (life sciences)  . Smokeless tobacco: Never Used  . Tobacco comment: Quit smoking 1996  Substance Use Topics  . Alcohol use: No    ALLERGIES:  Allergies  Allergen Reactions  . Amiodarone Nausea And Vomiting  . Bactrim [Sulfamethoxazole-Trimethoprim] Other (See Comments)    Unknown reaction. Listed on MAR  . Penicillins Hives, Swelling and Rash     PERTINENT MEDICATIONS:  Outpatient Encounter Medications as of 10/19/2017  Medication Sig  . acetaminophen (TYLENOL) 500 MG tablet Take 1 tablet (500 mg total) by mouth every 6 (six) hours as needed. (Patient taking differently: Take 1,000 mg by mouth 2 (two) times daily. )  . acetaminophen (TYLENOL) 500 MG tablet Take 500 mg  by mouth every 6 (six) hours as needed for fever.  Marland Kitchen albuterol (ACCUNEB) 0.63 MG/3ML nebulizer solution Take 1 ampule by nebulization every 8 (eight) hours as needed for wheezing.  Marland Kitchen apixaban (ELIQUIS) 5 MG TABS tablet Take 5 mg by mouth 2 (two) times daily.  . divalproex (DEPAKOTE) 250 MG DR tablet TAKE (1) TABLET BY MOUTH TWICE DAILY.  . finasteride (PROSCAR) 5 MG tablet Take 5 mg by mouth daily.   . Fluticasone-Salmeterol (ADVAIR) 250-50 MCG/DOSE AEPB Inhale 1 puff into the lungs 2 (two) times daily.  . furosemide (LASIX) 20 MG tablet Take 1 tablet  (20 mg total) by mouth daily. (Patient taking differently: Take 10 mg by mouth daily. )  . lactulose (CHRONULAC) 10 GM/15ML solution Take 10 g by mouth 2 (two) times daily.   . mineral oil liquid Place 0.05 mLs in ear(s) once a week. On Thursday  . Multiple Vitamins-Minerals (MULTIVITAMIN WITH MINERALS) tablet Take 1 tablet by mouth daily.  . nitroGLYCERIN (NITROSTAT) 0.4 MG SL tablet Place 0.4 mg under the tongue every 5 (five) minutes as needed for chest pain.   . potassium chloride SA (K-DUR,KLOR-CON) 20 MEQ tablet take 1 tablet by mouth once daily (Patient taking differently: 20 mEq by mouth daily)  . rosuvastatin (CRESTOR) 10 MG tablet Take 10 mg by mouth daily.  Marland Kitchen senna-docusate (SENNA-S) 8.6-50 MG per tablet Take 1 tablet by mouth as needed for mild constipation.   . sertraline (ZOLOFT) 100 MG tablet Take 100 mg by mouth daily.  . Skin Protectants, Misc. (MINERIN) CREA Apply 1 application topically at bedtime. To back  . sotalol (BETAPACE) 80 MG tablet take 1 tablet by mouth every 12 hours (Patient taking differently: 80 mg oral twice daily)  . tamsulosin (FLOMAX) 0.4 MG CAPS capsule Take 1 capsule by mouth daily.  . temazepam (RESTORIL) 15 MG capsule Take 15 mg by mouth at bedtime.  . traMADol (ULTRAM) 50 MG tablet Take 25 mg by mouth every 12 (twelve) hours as needed (pain).  . Whey Protein POWD Take 1 Scoop by mouth 2 (two) times daily.   No facility-administered encounter medications on file as of 10/19/2017.     PHYSICAL EXAM:   General: Elderly, frail, thin; sleeping Cardiovascular: irreg rhythm; reg rate Pulmonary: diminished in bases; shallow breathing Abdomen: soft, active BS Extremities: weak upon standing Skin: thin, easily bruised Neurological: sleeping; easily arouses; +generalized weakness  Nathanial Rancher, NP

## 2017-12-09 ENCOUNTER — Telehealth: Payer: Self-pay | Admitting: Hematology

## 2017-12-09 NOTE — Telephone Encounter (Signed)
New referral received from Dr. Shelia Media from [place] for anemia. Spoke to the pt's son, Braven Wolk, and scheduled the pt to see Dr. Irene Limbo on 9/3 at 1pm. Mr. Housman is aware for his father to arrive 30 minutes early.

## 2017-12-16 ENCOUNTER — Encounter: Payer: Self-pay | Admitting: Nurse Practitioner

## 2017-12-16 ENCOUNTER — Non-Acute Institutional Stay: Payer: PPO | Admitting: Nurse Practitioner

## 2017-12-16 VITALS — BP 126/54 | HR 99 | Resp 16

## 2017-12-16 DIAGNOSIS — Z741 Need for assistance with personal care: Secondary | ICD-10-CM

## 2017-12-16 DIAGNOSIS — R630 Anorexia: Secondary | ICD-10-CM

## 2017-12-16 DIAGNOSIS — Z515 Encounter for palliative care: Secondary | ICD-10-CM

## 2017-12-16 DIAGNOSIS — R269 Unspecified abnormalities of gait and mobility: Secondary | ICD-10-CM

## 2017-12-16 DIAGNOSIS — F0390 Unspecified dementia without behavioral disturbance: Secondary | ICD-10-CM

## 2017-12-16 NOTE — Progress Notes (Signed)
PALLIATIVE CARE CONSULT VISIT   PATIENT NAME: Steve Norris DOB: [birth year] MRN: [id number]  PRIMARY CARE PROVIDER:   [place], [place] PROVIDER:  Lady Norris, [address] [place], [place] [address]  RESPONSIBLE PARTY:   Steve Burkett "bill"  Dimas Norris. (son) [phone]  ASSESSMENT/RECOMMENDATIONs:  Dementia -depression -gait disorder with history of falls -oriented to self, city, and year -zoloft 100mg  qD; patient denies depression  Chronic medical problems being managed by primary care MD  CAD, Atrial Fibrillation, SSS, CHF (diastolic), SDH with new onset of seizure's,HTN, HLD, COPD, sleep disturbance, chronic pain, constipation -apixaban 5mg  BID -Sotalol 80mg  Q12 -depakote 250mg  BID -lasix 20mg  QD -Potassium 58meq Qd -Cestor 1mg  daily -Advair 250/50 1 puff BID -tamsulosin 0.4mg  daily -temazepam 15mg  at bedtime -ultram 25mg  Q12hr (denies pain) -lactulose 10gm BID   Decreased oral intake with signs of dehydration on exam on 8/221/19  -recent dental extraction -membranes pink , very dry and cracked -Please Offer and Encourage Fluid intake; continue protein shake BID   ACP -DNR/MOST give IVF and antibiotic's no feeding tube, no ventilator    I spent 30 minutes providing this consultation,  from 1:00 to 1:30. More than 50% of the time in this consultation was spent coordinating communication.   HISTORY OF PRESENT ILLNESS:  Steve Norris is a 84 y.o. year old male with multiple medical problems including dementia. Palliative Care was asked to assist with symptom  address goals of care.   CODE STATUS:   PPS: 30% HOSPICE ELIGIBILITY/DIAGNOSIS: TBD  PAST MEDICAL HISTORY:  Past Medical History:  Diagnosis Date  . Arthritis   . Asthma   . Barrett's esophagus 03-19-2009   EGD  . CAD (coronary artery disease)    a. s/p stent placement in 2006 to the LAD and RCA  . COPD (chronic obstructive pulmonary disease) ([place])   . Dementia   .  Diverticulosis 03-19-2009   Colonoscopy   . Erosive esophagitis 03-19-2009   EGD  . GERD (gastroesophageal reflux disease)   . Hiatal hernia 03-19-2009   EGD  . Hyperlipidemia   . Hypertension   . Internal hemorrhoids 03-19-2009   Colonoscopy  . Paroxysmal atrial fibrillation (HCC)   . Sinus node dysfunction (HCC)    a. s/p PPM placement in 2011  . Subdural hematoma ([place]) 05/08/2017   small, left/notes 05/08/2017    SOCIAL HX:  Social History   Tobacco Use  . Smoking status: Former Research scientist (life sciences)  . Smokeless tobacco: Never Used  . Tobacco comment: Quit smoking 1996  Substance Use Topics  . Alcohol use: No    ALLERGIES:  Allergies  Allergen Reactions  . Amiodarone Nausea And Vomiting  . Bactrim [Sulfamethoxazole-Trimethoprim] Other (See Comments)    Unknown reaction. Listed on MAR  . Penicillins Hives, Swelling and Rash     PERTINENT MEDICATIONS:  Outpatient Encounter Medications as of 12/16/2017  Medication Sig  . acetaminophen (TYLENOL) 500 MG tablet Take 1 tablet (500 mg total) by mouth every 6 (six) hours as needed. (Patient taking differently: Take 1,000 mg by mouth 2 (two) times daily. )  . acetaminophen (TYLENOL) 500 MG tablet Take 500 mg by mouth every 6 (six) hours as needed for fever.  Marland Kitchen albuterol (ACCUNEB) 0.63 MG/3ML nebulizer solution Take 1 ampule by nebulization every 8 (eight) hours as needed for wheezing.  Marland Kitchen apixaban (ELIQUIS) 5 MG TABS tablet Take 5 mg by mouth 2 (two) times daily.  . divalproex (DEPAKOTE) 250 MG DR  tablet TAKE (1) TABLET BY MOUTH TWICE DAILY.  . finasteride (PROSCAR) 5 MG tablet Take 5 mg by mouth daily.   . Fluticasone-Salmeterol (ADVAIR) 250-50 MCG/DOSE AEPB Inhale 1 puff into the lungs 2 (two) times daily.  . furosemide (LASIX) 20 MG tablet Take 1 tablet (20 mg total) by mouth daily. (Patient taking differently: Take 10 mg by mouth daily. )  . lactulose (CHRONULAC) 10 GM/15ML solution Take 10 g by mouth 2 (two) times daily.   . mineral oil  liquid Place 0.05 mLs in ear(s) once a week. On Thursday  . Multiple Vitamins-Minerals (MULTIVITAMIN WITH MINERALS) tablet Take 1 tablet by mouth daily.  . nitroGLYCERIN (NITROSTAT) 0.4 MG SL tablet Place 0.4 mg under the tongue every 5 (five) minutes as needed for chest pain.   . potassium chloride SA (K-DUR,KLOR-CON) 20 MEQ tablet take 1 tablet by mouth once daily (Patient taking differently: 20 mEq by mouth daily)  . rosuvastatin (CRESTOR) 10 MG tablet Take 10 mg by mouth daily.  Marland Kitchen senna-docusate (SENNA-S) 8.6-50 MG per tablet Take 1 tablet by mouth as needed for mild constipation.   . sertraline (ZOLOFT) 100 MG tablet Take 100 mg by mouth daily.  . Skin Protectants, Misc. (MINERIN) CREA Apply 1 application topically at bedtime. To back  . sotalol (BETAPACE) 80 MG tablet take 1 tablet by mouth every 12 hours (Patient taking differently: 80 mg oral twice daily)  . tamsulosin (FLOMAX) 0.4 MG CAPS capsule Take 1 capsule by mouth daily.  . temazepam (RESTORIL) 15 MG capsule Take 15 mg by mouth at bedtime.  . traMADol (ULTRAM) 50 MG tablet Take 25 mg by mouth every 12 (twelve) hours as needed (pain).  . Whey Protein POWD Take 1 Scoop by mouth 2 (two) times daily.   No facility-administered encounter medications on file as of 12/16/2017.     PHYSICAL EXAM:   General: NAD, frail appearing, thin Cardiovascular: regular rate and rhythm Pulmonary: clear ant fields Abdomen: soft, nontender, + bowel sounds GU: no suprapubic tenderness Extremities: no edema, no joint deformities Skin: no rashes Neurological: Weakness but otherwise nonfocal  Steve G Martinique, NP

## 2017-12-25 NOTE — Progress Notes (Signed)
HEMATOLOGY/ONCOLOGY CONSULTATION NOTE  Date of Service: 12/29/2017  Patient Care Team: [place], [place] as PCP - General ([place])  CHIEF COMPLAINTS/PURPOSE OF CONSULTATION:  Anemia  HISTORY OF PRESENTING ILLNESS:   Steve Norris is a wonderful 84 y.o. male who has been referred to [place] by Dr. Deland Pretty for evaluation and management of Anemia. He is accompanied today by his son and daughter-in-law. The pt reports that he is doing well overall.   Prior to our visit, the pt presented to the ED on 10/05/17 after persistent gum bleeding developed on the day he had teeth extracted. The pt was on Eliquis for his Afib and was told to hold this. Labs drawn at ED visit showed HGB at 8.0, and labs drawn two months later on 12/10/17 showed that HGB increased to 9.0. The pt began PO Iron replacement in that interim.   The pt reports that he has not noticed any blood in the stools, nose bleeds, and other concerns of bleeding recently.   The pt has lost 30 pounds in the last year and a half. The pt has full dentures, but has not been able to wear them recently, which affects his food consumption. The pt's family notes that he will likely not be getting new dentures soon, and that he is trying to eat softer foods.   The pt denies any new back pain, chest pain or abdominal pains.   The pt notes that he has been taking Eliquis for a long time. He denies receiving any blood transfusions. He notes that he has had more fatigue. He also notes that he has light headedness when he stands up. The pt's family notes that a stool study is trying to be collected at the pt's living facility but not successfully so far.   The pt' son notes that his father has rapidly declined in functional status over the last year and a half. The son and daughter-in-law also note that the goals of care are not for invasive testing and would focus on maintaining quality of life.   Most recent lab results  (11/30/17) of CBC w/diff is as follows: all values are WNL except for RBC at 3.03, HGB at 9.0, HCT at 28.7, MCHC at 31.4, Neut% at 34.7%, Mono% at 20.2%, Eos% at 7.5%. 11/30/17 Iron and TIBC revealed TIBC at 197, Iron at 15, and an 8% Iron Saturation.  On review of systems, pt reports fatigue, weight loss, light headedness when standing, and denies chest pain, back pain, abdominal pains, blood in the stools, dizziness, leg swelling, and any other symptoms.   On PMHx the pt reports renal cystic disease, neoplasm of genitourinary organs, urinary obstruction, CAD s/p cypher stent 2006 RCA and LAD, Hypercholesterolemia, Afib on anticoagulation, COPD, Gout, HTN, Low Vitamin D, Insomnia, Barrett's esophagus, dual-chamber permanent pacemaker. Tonsillectomy, Vasectomy, hemorrhoidectomy.  MEDICAL HISTORY:  Past Medical History:  Diagnosis Date  . Arthritis   . Asthma   . Barrett's esophagus 03-19-2009   EGD  . CAD (coronary artery disease)    a. s/p stent placement in 2006 to the LAD and RCA  . COPD (chronic obstructive pulmonary disease) ([place])   . Dementia   . Diverticulosis 03-19-2009   Colonoscopy   . Erosive esophagitis 03-19-2009   EGD  . GERD (gastroesophageal reflux disease)   . Hiatal hernia 03-19-2009   EGD  . Hyperlipidemia   . Hypertension   . Internal hemorrhoids 03-19-2009   Colonoscopy  . Paroxysmal  atrial fibrillation ([place])   . Sinus node dysfunction (HCC)    a. s/p PPM placement in 2011  . Subdural hematoma ([place]) 05/08/2017   small, left/notes 05/08/2017    SURGICAL HISTORY: Past Surgical History:  Procedure Laterality Date  . CARDIAC CATHETERIZATION  02/12/2009   mild ostial left main disease,patent LAD & RCA stents   . CARDIOVERSION N/A 11/06/2014   Procedure: CARDIOVERSION;  Surgeon: [name], MD;  Location[facility] ENDOSCOPY;  Service: Cardiovascular;  Laterality: N/A;  . CARDIOVERSION N/A 08/02/2015   Procedure: CARDIOVERSION;  Surgeon: Sueanne Margarita, MD;  Location:  [facility] ENDOSCOPY;  Service: Cardiovascular;  Laterality: N/A;  . CATARACT EXTRACTION    . COLONOSCOPY    . CORONARY ANGIOPLASTY WITH STENT PLACEMENT  2006   Right Coronary Artery Cypher stents placed 2006  . HEMORRHOID SURGERY    . INSERT / REPLACE / REMOVE PACEMAKER  07/25/2009   [employer]ent  . LEFT HEART CATHETERIZATION WITH CORONARY ANGIOGRAM N/A 07/01/2013   Procedure: LEFT HEART CATHETERIZATION WITH CORONARY ANGIOGRAM;  Surgeon: Sanda Klein, MD;  Location: [facility] CATH LAB;  Service: Cardiovascular;  Laterality: N/A;  . NM MYOCAR PERF WALL MOTION  08/14/2008   no significant ischemia EF 64%  . TEE WITHOUT CARDIOVERSION N/A 08/02/2015   Procedure: TRANSESOPHAGEAL ECHOCARDIOGRAM (TEE);  Surgeon: Sueanne Margarita, MD;  Location: [facility] ENDOSCOPY;  Service: Cardiovascular;  Laterality: N/A;  . TONSILLECTOMY    . US ECHOCARDIOGRAPHY  07/12/2009   borderline LA enlargement,mild mitral annular ca+, AOV mildly sclerotic,trace AI.  Marland Kitchen VASECTOMY      SOCIAL HISTORY: Social History   Socioeconomic History  . Marital status: Married    Spouse name: Not on file  . Number of children: Not on file  . Years of education: Not on file  . Highest education level: Not on file  Occupational History  . Occupation: Retired   Scientific laboratory technician  . Financial resource strain: Not on file  . Food insecurity:    Worry: Not on file    Inability: Not on file  . Transportation needs:    Medical: Not on file    Non-medical: Not on file  Tobacco Use  . Smoking status: Former Research scientist (life sciences)  . Smokeless tobacco: Never Used  . Tobacco comment: Quit smoking 1996  Substance and Sexual Activity  . Alcohol use: No  . Drug use: No  . Sexual activity: Not on file  Lifestyle  . Physical activity:    Days per week: Not on file    Minutes per session: Not on file  . Stress: Not on file  Relationships  . Social connections:    Talks on phone: Not on file    Gets together: Not on file    Attends religious service: Not on file     Active member of club or organization: Not on file    Attends meetings of clubs or organizations: Not on file    Relationship status: Not on file  . Intimate partner violence:    Fear of current or ex partner: Not on file    Emotionally abused: Not on file    Physically abused: Not on file    Forced sexual activity: Not on file  Other Topics Concern  . Not on file  Social History Narrative   Daily caffeine     FAMILY HISTORY: Family History  Problem Relation Age of Onset  . Heart disease Mother   . Heart disease Father   . Colon cancer Neg  Hx     ALLERGIES:  is allergic to amiodarone; bactrim [sulfamethoxazole-trimethoprim]; and penicillins.  MEDICATIONS:  Current Outpatient Medications  Medication Sig Dispense Refill  . acetaminophen (TYLENOL) 500 MG tablet Take 1 tablet (500 mg total) by mouth every 6 (six) hours as needed. (Patient taking differently: Take 1,000 mg by mouth 2 (two) times daily. ) 30 tablet 0  . albuterol (ACCUNEB) 0.63 MG/3ML nebulizer solution Take 1 ampule by nebulization every 8 (eight) hours as needed for wheezing.    Marland Kitchen apixaban (ELIQUIS) 5 MG TABS tablet Take 5 mg by mouth 2 (two) times daily.    . divalproex (DEPAKOTE) 250 MG DR tablet TAKE (1) TABLET BY MOUTH TWICE DAILY. 60 tablet 12  . finasteride (PROSCAR) 5 MG tablet Take 5 mg by mouth daily.     . Fluticasone-Salmeterol (ADVAIR) 250-50 MCG/DOSE AEPB Inhale 1 puff into the lungs 2 (two) times daily.    . furosemide (LASIX) 20 MG tablet Take 1 tablet (20 mg total) by mouth daily. (Patient taking differently: Take 10 mg by mouth daily. ) 30 tablet 6  . lactulose (CHRONULAC) 10 GM/15ML solution Take 10 g by mouth 2 (two) times daily.     . mineral oil liquid Place 0.05 mLs in ear(s) once a week. On Thursday    . Multiple Vitamins-Minerals (MULTIVITAMIN WITH MINERALS) tablet Take 1 tablet by mouth daily.    . nitroGLYCERIN (NITROSTAT) 0.4 MG SL tablet Place 0.4 mg under the tongue every 5 (five) minutes  as needed for chest pain.     . potassium chloride SA (K-DUR,KLOR-CON) 20 MEQ tablet take 1 tablet by mouth once daily (Patient taking differently: 20 mEq by mouth daily) 30 tablet 6  . rosuvastatin (CRESTOR) 10 MG tablet Take 10 mg by mouth daily.    Marland Kitchen senna-docusate (SENNA-S) 8.6-50 MG per tablet Take 1 tablet by mouth as needed for mild constipation.     . sertraline (ZOLOFT) 100 MG tablet Take 50 mg by mouth daily.     . Skin Protectants, Misc. (MINERIN) CREA Apply 1 application topically at bedtime. To back    . sotalol (BETAPACE) 80 MG tablet take 1 tablet by mouth every 12 hours (Patient taking differently: 80 mg oral twice daily) 60 tablet 5  . tamsulosin (FLOMAX) 0.4 MG CAPS capsule Take 1 capsule by mouth daily.    . traMADol (ULTRAM) 50 MG tablet Take 25 mg by mouth every 12 (twelve) hours as needed (pain).    . Whey Protein POWD Take 1 Scoop by mouth 2 (two) times daily.    Marland Kitchen acetaminophen (TYLENOL) 500 MG tablet Take 500 mg by mouth every 6 (six) hours as needed for fever.    . temazepam (RESTORIL) 15 MG capsule Take 15 mg by mouth at bedtime.     No current facility-administered medications for this visit.     REVIEW OF SYSTEMS:    10 Point review of Systems was done is negative except as noted above.  PHYSICAL EXAMINATION: ECOG PERFORMANCE STATUS: 3 - Symptomatic, >50% confined to bed  . Vitals:   12/29/17 1305  BP: (!) 106/58  Pulse: 66  Resp: 18  Temp: 97.6 F (36.4 C)  SpO2: 92%   Filed Weights   12/29/17 1305  Weight: 136 lb 9.6 oz (62 kg)   .Body mass index is 19.6 kg/m.  GENERAL:alert, in no acute distress and comfortable SKIN: no acute rashes, no significant lesions EYES: conjunctiva are pink and non-injected, sclera anicteric OROPHARYNX: MMM, no  exudates, no oropharyngeal erythema or ulceration NECK: supple, no JVD LYMPH:  no palpable lymphadenopathy in the cervical, axillary or inguinal regions LUNGS: clear to auscultation b/l with normal  respiratory effort HEART: regular rate & rhythm ABDOMEN:  normoactive bowel sounds , non tender, not distended. Extremity: no pedal edema PSYCH: alert & oriented x 3 with fluent speech NEURO: no focal motor/sensory deficits  LABORATORY DATA:  I have reviewed the data as listed  . CBC Latest Ref Rng & Units 12/29/2017 12/29/2017 10/06/2017  WBC 4.0 - 10.3 K/uL 8.2 - 7.9  Hemoglobin 13.0 - 17.1 g/dL 9.4(L) - 8.0(L)  Hematocrit 37.5 - 51.0 % 30.3(L) 29.2(L) 25.2(L)  Platelets 140 - 400 K/uL 343 - 303   . CBC    Component Value Date/Time   WBC 8.2 12/29/2017 1406   RBC 3.28 (L) 12/29/2017 1406   HGB 9.4 (L) 12/29/2017 1406   HGB 11.4 (L) 12/31/2016 1440   HCT 30.3 (L) 12/29/2017 1406   HCT 29.2 (L) 12/29/2017 1406   PLT 343 12/29/2017 1406   PLT 212 12/31/2016 1440   MCV 92.4 12/29/2017 1406   MCV 87 12/31/2016 1440   MCH 28.7 12/29/2017 1406   MCHC 31.0 (L) 12/29/2017 1406   RDW 18.7 (H) 12/29/2017 1406   RDW 18.4 (H) 12/31/2016 1440   LYMPHSABS 3.5 (H) 12/29/2017 1406   MONOABS 1.1 (H) 12/29/2017 1406   EOSABS 0.5 12/29/2017 1406   BASOSABS 0.0 12/29/2017 1406   . CMP Latest Ref Rng & Units 12/29/2017 05/09/2017 05/08/2017  Glucose 70 - 99 mg/dL 119(H) 90 105(H)  BUN 8 - 23 mg/dL _0 Creatinine 0.61 - 1.24 mg/dL 0.71 0.45(L) 0.59(L)  Sodium 135 - 145 mmol/L 139 136 135  Potassium 3.5 - 5.1 mmol/L 4.2 3.8 4.0  Chloride 98 - 111 mmol/L 103 104 102  CO2 22 - 32 mmol/L _1 Calcium 8.9 - 10.3 mg/dL 8.7(L) 8.0(L) 8.2(L)  Total Protein 6.5 - 8.1 g/dL 8.7(H) - -  Total Bilirubin 0.3 - 1.2 mg/dL 0.3 - -  Alkaline Phos 38 - 126 U/L 88 - -  AST 15 - 41 U/L 17 - -  ALT 0 - 44 U/L <6 - -   11/30/17 CBC w/diff, CMP, Iron and TIBC:     RADIOGRAPHIC STUDIES: I have personally reviewed the radiological images as listed and agreed with the findings in the report. No results found.  ASSESSMENT & PLAN:  84 y.o. male with   1. Normocytic Anemia ? Related to blood loss  from gum bleeding. Patient is on chronic anticoag for afib and this is an additional risk factor for slow GI losses as well. PLAN  -Discussed patient's most recent labs from 11/30/17, HGB at 9.0 -09/18/16 Labs showed HGB at 10.3, and 04/23/16 labs showed HGB at 11.2 -Weight loss is potentially a function of inability to use dentures - which might affect po food intake. -Pt is taking full dose of Eliquis at 53m twice a day, and discussed that this is a risk factor for GI bleeding.  -Recommend cardiology/PCP weight pros vs cons of considering 2.572mBID, given pt's age and risk of bleeding.  -Will collect blood tests today and evaluate for etiology of anemia as noted below . Orders Placed This Encounter  Procedures  . CBC with Differential/Platelet    Standing Status:   Future    Number of Occurrences:   1    Standing Expiration Date:   02/02/2019  .  CMP ([place] only)    Standing Status:   Future    Number of Occurrences:   1    Standing Expiration Date:   12/30/2018  . Ferritin    Standing Status:   Future    Number of Occurrences:   1    Standing Expiration Date:   12/30/2018  . Iron and TIBC    Standing Status:   Future    Number of Occurrences:   1    Standing Expiration Date:   12/29/2018  . Vitamin B12    Standing Status:   Future    Number of Occurrences:   1    Standing Expiration Date:   12/29/2018  . Folate RBC    Standing Status:   Future    Number of Occurrences:   1    Standing Expiration Date:   12/30/2018  . Multiple Myeloma Panel (SPEP&IFE w/QIG)    Standing Status:   Future    Number of Occurrences:   1    Standing Expiration Date:   12/30/2018  . Kappa/lambda light chains    Standing Status:   Future    Number of Occurrences:   1    Standing Expiration Date:   02/02/2019  . Lactate dehydrogenase    Standing Status:   Future    Number of Occurrences:   1    Standing Expiration Date:   12/30/2018  . Haptoglobin    Standing Status:   Future    Number of Occurrences:    1    Standing Expiration Date:   12/30/2018  . TSH    Standing Status:   Future    Number of Occurrences:   1    Standing Expiration Date:   12/29/2018  . Sample to Blood Bank    Standing Status:   Future    Number of Occurrences:   1    Standing Expiration Date:   12/30/2018   -hgb today is somewhat better at 9.4. -Discussed the option to replace iron aggressively with IV Injectafer - patient wants to think about this. -Will order blood transfusion for HGB <8 -Discussed the patient's goals of care with pt and his family which are conservative in nature and are not looking for interventions like a BM Bx at this time   2) . Patient Active Problem List   Diagnosis Date Noted  . Palliative care encounter 08/24/2017  . Subdural hematoma ([place]) 05/08/2017  . New onset seizure ([place]) 12/23/2016  . Acute left otitis media 12/23/2016  . Acute encephalopathy 12/23/2016  . PAF (paroxysmal atrial fibrillation) ([place]) 12/23/2016  . Benign essential HTN 12/23/2016  . Chronic diastolic heart failure, NYHA class 2 ([place]) 12/23/2016  . Hyperlipidemia 12/23/2016  . Dementia 12/23/2016  . Asthma 12/23/2016  . Elevated AST (SGOT) 12/23/2016  . Acute diastolic CHF (congestive heart failure) ([place])   . Pressure injury of skin 08/09/2016  . BPH (benign prostatic hyperplasia) 08/09/2016  . HTN (hypertension) 08/09/2016  . CHF exacerbation ([place]) 08/08/2016  . Anemia 03/25/2016  . SSS (sick sinus syndrome) ([place]) 03/25/2016  . CAP (community acquired pneumonia)   . Atrial fibrillation with rapid ventricular response ([place]) 11/04/2014  . Dyspnea 12/15/2012  . Long term current use of anticoagulant therapy 07/14/2012  . COPD (chronic obstructive pulmonary disease) ([place]) 02/23/2012  . CAD (coronary artery disease) 02/23/2012  . S/P placement of cardiac pacemaker 02/23/2012  . Atrial fibrillation ([place]) 02/23/2012  . Barrett's esophagus 04/30/2009   -continue  f/u with PCP for mx of medical  co-morbidities.  Labs today F/u based on lab results   All of the patients questions were answered with apparent satisfaction. The patient knows to call the clinic with any problems, questions or concerns.  The total time spent in the appt was 60 minutes and more than 50% was on counseling and direct patient cares.   Sullivan Lone MD MS AAHIVMS [facility] Hematology/Oncology Physician [facility]  (Office):       [phone] (Work cell):  [phone] (Fax):           [phone]  12/29/2017 1:52 PM  I, Baldwin Jamaica, am acting as a scribe for Dr. Irene Limbo  .I have reviewed the above documentation for accuracy and completeness, and I agree with the above. Brunetta Genera MD   ADDENDUM  . Lab Results  Component Value Date   LDH 81 (L) 12/29/2017   Haptglobin WNL  Component     Latest Ref Rng & Units 12/29/2017  IgG (Immunoglobin G), Serum     700 - 1,600 mg/dL 2,924 (H)  IgA     61 - 437 mg/dL 276  IgM (Immunoglobulin M), Srm     15 - 143 mg/dL 2,020 (H)  Total Protein ELP     6.0 - 8.5 g/dL 8.1  Albumin SerPl Elph-Mcnc     2.9 - 4.4 g/dL 2.4 (L)  Alpha 1     0.0 - 0.4 g/dL 0.4  Alpha2 Glob SerPl Elph-Mcnc     0.4 - 1.0 g/dL 0.7  B-Globulin SerPl Elph-Mcnc     0.7 - 1.3 g/dL 0.8  Gamma Glob SerPl Elph-Mcnc     0.4 - 1.8 g/dL 3.7 (H)  M Protein SerPl Elph-Mcnc     Not Observed g/dL 1.5 (H)  Globulin, Total     2.2 - 3.9 g/dL 5.7 (H)  Albumin/Glob SerPl     0.7 - 1.7 0.5 (L)  IFE 1      Comment  Please Note (HCV):      Comment  Iron     42 - 163 ug/dL 16 (L)  TIBC     202 - 409 ug/dL 188 (L)  Saturation Ratios     42 - 163 % 8 (L)  UIBC     ug/dL 172  Folate, Hemolysate     Not Estab. ng/mL 454.6  HCT     37.5 - 51.0 % 29.2 (L)  Folate, RBC     >498 ng/mL 1,557  Kappa free light chain     3.3 - 19.4 mg/L 482.7 (H)  Lamda free light chains     5.7 - 26.3 mg/L 80.7 (H)  Kappa, lamda light chain ratio     0.26 - 1.65 5.98 (H)   Ferritin     24 - 336 ng/mL 47  Vitamin B12     180 - 914 pg/mL 471  LDH     98 - 192 U/L 81 (L)  Haptoglobin     34 - 200 mg/dL 209 (H)  TSH     0.320 - 4.118 uIU/mL 3.119   Multiple Myeloma Panel (SPEP&IFE w/QIG)      The value has a corrected status.      No reference range information available      Resulting Lab: [facility]      Comments:           (NOTE)  Immunofixation shows IgM monoclonal protein with kappa light           chain           specificity.           An apparent polyclonal gammopathy: IgG. Kappa and lambda            typing           appear increased.   PLAN - mild iron deficiency - would recommend Iron polysaccharide 177m po BID -consideration of GI w/u based on goals of care - will defer decision to PCP. -IgM Kappa monoclonal paraproteinemia -- cannot r/o lymphoplasmacytic lymphoma. -will f/u with patient to determine extent of workup for IGM monoclonal paraproteinemia.  .Brunetta GeneraMD

## 2017-12-29 ENCOUNTER — Inpatient Hospital Stay: Payer: Medicare Other | Attending: Hematology | Admitting: Hematology

## 2017-12-29 ENCOUNTER — Encounter: Payer: Self-pay | Admitting: Hematology

## 2017-12-29 ENCOUNTER — Inpatient Hospital Stay: Payer: Medicare Other

## 2017-12-29 VITALS — BP 106/58 | HR 66 | Temp 97.6°F | Resp 18 | Ht 70.0 in | Wt 136.6 lb

## 2017-12-29 DIAGNOSIS — R5383 Other fatigue: Secondary | ICD-10-CM | POA: Diagnosis not present

## 2017-12-29 DIAGNOSIS — D509 Iron deficiency anemia, unspecified: Secondary | ICD-10-CM

## 2017-12-29 DIAGNOSIS — E559 Vitamin D deficiency, unspecified: Secondary | ICD-10-CM | POA: Insufficient documentation

## 2017-12-29 DIAGNOSIS — I110 Hypertensive heart disease with heart failure: Secondary | ICD-10-CM | POA: Diagnosis not present

## 2017-12-29 DIAGNOSIS — K449 Diaphragmatic hernia without obstruction or gangrene: Secondary | ICD-10-CM | POA: Diagnosis not present

## 2017-12-29 DIAGNOSIS — I480 Paroxysmal atrial fibrillation: Secondary | ICD-10-CM | POA: Insufficient documentation

## 2017-12-29 DIAGNOSIS — I10 Essential (primary) hypertension: Secondary | ICD-10-CM

## 2017-12-29 DIAGNOSIS — F0390 Unspecified dementia without behavioral disturbance: Secondary | ICD-10-CM | POA: Diagnosis not present

## 2017-12-29 DIAGNOSIS — Z87891 Personal history of nicotine dependence: Secondary | ICD-10-CM | POA: Insufficient documentation

## 2017-12-29 DIAGNOSIS — K2270 Barrett's esophagus without dysplasia: Secondary | ICD-10-CM | POA: Diagnosis not present

## 2017-12-29 DIAGNOSIS — Z79899 Other long term (current) drug therapy: Secondary | ICD-10-CM

## 2017-12-29 DIAGNOSIS — I2510 Atherosclerotic heart disease of native coronary artery without angina pectoris: Secondary | ICD-10-CM | POA: Diagnosis not present

## 2017-12-29 DIAGNOSIS — I5032 Chronic diastolic (congestive) heart failure: Secondary | ICD-10-CM | POA: Insufficient documentation

## 2017-12-29 DIAGNOSIS — D500 Iron deficiency anemia secondary to blood loss (chronic): Secondary | ICD-10-CM | POA: Diagnosis not present

## 2017-12-29 DIAGNOSIS — I495 Sick sinus syndrome: Secondary | ICD-10-CM

## 2017-12-29 DIAGNOSIS — M129 Arthropathy, unspecified: Secondary | ICD-10-CM | POA: Diagnosis not present

## 2017-12-29 DIAGNOSIS — G4700 Insomnia, unspecified: Secondary | ICD-10-CM | POA: Insufficient documentation

## 2017-12-29 DIAGNOSIS — D472 Monoclonal gammopathy: Secondary | ICD-10-CM | POA: Insufficient documentation

## 2017-12-29 DIAGNOSIS — E7800 Pure hypercholesterolemia, unspecified: Secondary | ICD-10-CM | POA: Diagnosis not present

## 2017-12-29 DIAGNOSIS — D649 Anemia, unspecified: Secondary | ICD-10-CM

## 2017-12-29 DIAGNOSIS — K219 Gastro-esophageal reflux disease without esophagitis: Secondary | ICD-10-CM | POA: Diagnosis not present

## 2017-12-29 DIAGNOSIS — I4891 Unspecified atrial fibrillation: Secondary | ICD-10-CM

## 2017-12-29 DIAGNOSIS — M109 Gout, unspecified: Secondary | ICD-10-CM | POA: Diagnosis not present

## 2017-12-29 DIAGNOSIS — J449 Chronic obstructive pulmonary disease, unspecified: Secondary | ICD-10-CM

## 2017-12-29 LAB — CBC WITH DIFFERENTIAL/PLATELET
BASOS ABS: 0 10*3/uL (ref 0.0–0.1)
BASOS PCT: 1 %
EOS ABS: 0.5 10*3/uL (ref 0.0–0.5)
EOS PCT: 6 %
HCT: 30.3 % — ABNORMAL LOW (ref 38.4–49.9)
HEMOGLOBIN: 9.4 g/dL — AB (ref 13.0–17.1)
Lymphocytes Relative: 42 %
Lymphs Abs: 3.5 10*3/uL — ABNORMAL HIGH (ref 0.9–3.3)
MCH: 28.7 pg (ref 27.2–33.4)
MCHC: 31 g/dL — ABNORMAL LOW (ref 32.0–36.0)
MCV: 92.4 fL (ref 79.3–98.0)
Monocytes Absolute: 1.1 10*3/uL — ABNORMAL HIGH (ref 0.1–0.9)
Monocytes Relative: 13 %
NEUTROS PCT: 38 %
Neutro Abs: 3.1 10*3/uL (ref 1.5–6.5)
PLATELETS: 343 10*3/uL (ref 140–400)
RBC: 3.28 MIL/uL — AB (ref 4.20–5.82)
RDW: 18.7 % — ABNORMAL HIGH (ref 11.0–14.6)
WBC: 8.2 10*3/uL (ref 4.0–10.3)

## 2017-12-29 LAB — LACTATE DEHYDROGENASE: LDH: 81 U/L — AB (ref 98–192)

## 2017-12-29 LAB — SAMPLE TO BLOOD BANK

## 2017-12-29 LAB — CMP (CANCER CENTER ONLY)
ALBUMIN: 2 g/dL — AB (ref 3.5–5.0)
AST: 17 U/L (ref 15–41)
Alkaline Phosphatase: 88 U/L (ref 38–126)
Anion gap: 7 (ref 5–15)
BUN: 12 mg/dL (ref 8–23)
CHLORIDE: 103 mmol/L (ref 98–111)
CO2: 29 mmol/L (ref 22–32)
CREATININE: 0.71 mg/dL (ref 0.61–1.24)
Calcium: 8.7 mg/dL — ABNORMAL LOW (ref 8.9–10.3)
GFR, Estimated: >60 mL/min (ref >60)
GLUCOSE: 119 mg/dL — AB (ref 70–99)
Potassium: 4.2 mmol/L (ref 3.5–5.1)
SODIUM: 139 mmol/L (ref 135–145)
Total Bilirubin: 0.3 mg/dL (ref 0.3–1.2)
Total Protein: 8.7 g/dL — ABNORMAL HIGH (ref 6.5–8.1)

## 2017-12-29 LAB — VITAMIN B12: VITAMIN B 12: 471 pg/mL (ref 180–914)

## 2017-12-29 LAB — FERRITIN: Ferritin: 47 ng/mL (ref 24–336)

## 2017-12-29 LAB — IRON AND TIBC
Iron: 16 ug/dL — ABNORMAL LOW (ref 42–163)
SATURATION RATIOS: 8 % — AB (ref 42–163)
TIBC: 188 ug/dL — AB (ref 202–409)
UIBC: 172 ug/dL

## 2017-12-29 LAB — TSH: TSH: 3.119 u[IU]/mL (ref 0.320–4.118)

## 2017-12-30 LAB — MULTIPLE MYELOMA PANEL, SERUM
ALBUMIN/GLOB SERPL: 0.5 — AB (ref 0.7–1.7)
ALPHA 1: 0.4 g/dL (ref 0.0–0.4)
Albumin SerPl Elph-Mcnc: 2.4 g/dL — ABNORMAL LOW (ref 2.9–4.4)
Alpha2 Glob SerPl Elph-Mcnc: 0.7 g/dL (ref 0.4–1.0)
B-Globulin SerPl Elph-Mcnc: 0.8 g/dL (ref 0.7–1.3)
GLOBULIN, TOTAL: 5.7 g/dL — AB (ref 2.2–3.9)
Gamma Glob SerPl Elph-Mcnc: 3.7 g/dL — ABNORMAL HIGH (ref 0.4–1.8)
IGG (IMMUNOGLOBIN G), SERUM: 2924 mg/dL — AB (ref 700–1600)
IGM (IMMUNOGLOBULIN M), SRM: 2020 mg/dL — AB (ref 15–143)
IgA: 276 mg/dL (ref 61–437)
M PROTEIN SERPL ELPH-MCNC: 1.5 g/dL — AB
Total Protein ELP: 8.1 g/dL (ref 6.0–8.5)

## 2017-12-30 LAB — KAPPA/LAMBDA LIGHT CHAINS
KAPPA FREE LGHT CHN: 482.7 mg/L — AB (ref 3.3–19.4)
KAPPA, LAMDA LIGHT CHAIN RATIO: 5.98 — AB (ref 0.26–1.65)
LAMDA FREE LIGHT CHAINS: 80.7 mg/L — AB (ref 5.7–26.3)

## 2017-12-30 LAB — HAPTOGLOBIN: HAPTOGLOBIN: 209 mg/dL — AB (ref 34–200)

## 2017-12-30 LAB — FOLATE RBC
Folate, Hemolysate: 454.6 ng/mL
Folate, RBC: 1557 ng/mL (ref >498)
Hematocrit: 29.2 % — ABNORMAL LOW (ref 37.5–51.0)

## 2018-01-01 ENCOUNTER — Encounter: Payer: Self-pay | Admitting: Cardiovascular Disease

## 2018-01-01 ENCOUNTER — Ambulatory Visit: Payer: Medicare Other | Admitting: Cardiovascular Disease

## 2018-01-01 VITALS — BP 102/52 | HR 99 | Ht 70.0 in | Wt 136.8 lb

## 2018-01-01 DIAGNOSIS — Z7901 Long term (current) use of anticoagulants: Secondary | ICD-10-CM

## 2018-01-01 DIAGNOSIS — I495 Sick sinus syndrome: Secondary | ICD-10-CM

## 2018-01-01 DIAGNOSIS — Z79899 Other long term (current) drug therapy: Secondary | ICD-10-CM

## 2018-01-01 DIAGNOSIS — Z5181 Encounter for therapeutic drug level monitoring: Secondary | ICD-10-CM

## 2018-01-01 DIAGNOSIS — Z950 Presence of cardiac pacemaker: Secondary | ICD-10-CM

## 2018-01-01 DIAGNOSIS — R4189 Other symptoms and signs involving cognitive functions and awareness: Secondary | ICD-10-CM

## 2018-01-01 DIAGNOSIS — J449 Chronic obstructive pulmonary disease, unspecified: Secondary | ICD-10-CM

## 2018-01-01 DIAGNOSIS — I4891 Unspecified atrial fibrillation: Secondary | ICD-10-CM

## 2018-01-01 DIAGNOSIS — I5032 Chronic diastolic (congestive) heart failure: Secondary | ICD-10-CM

## 2018-01-01 DIAGNOSIS — D509 Iron deficiency anemia, unspecified: Secondary | ICD-10-CM

## 2018-01-01 DIAGNOSIS — I2510 Atherosclerotic heart disease of native coronary artery without angina pectoris: Secondary | ICD-10-CM

## 2018-01-01 MED ORDER — APIXABAN 2.5 MG PO TABS: 2.5000 mg | ORAL_TABLET | Freq: Two times a day (BID) | ORAL | 3 refills | Status: DC

## 2018-01-01 NOTE — Patient Instructions (Addendum)
Medication Instructions: Dr Sallyanne Kuster has recommended making the following medication changes: 1. DECREASE Eliquis to 2.5 mg twice daily 2. STOP Furosemide  Labwork: Your physician recommends that you return for lab work in 36 month.  Testing/Procedures: NONE ORDERED  Follow-up: Dr Sallyanne Kuster recommends that you schedule a follow-up appointment in 3 months with a pacemaker check.  If you need a refill on your cardiac medications before your next appointment, please call your pharmacy.

## 2018-01-01 NOTE — Progress Notes (Signed)
Patient ID: Steve Norris, male   DOB: 02/10/1930, 84 y.o.   MRN: [id number]    Cardiology Office Note    Date:  01/02/2018   ID:  Steve Norris, DOB 05-09-1929, MRN [id number]  PCP:  Deland Pretty, MD  Cardiologist:   Sanda Klein, MD   Chief Complaint  Patient presents with  . Follow-up    pt denied chest pain    History of Present Illness:  Steve Norris is a 84 y.o. male with a history of sinus node dysfunction and tachycardia bradycardia syndrome with recurrent paroxysmal atrial fibrillation.   This is the first time I have seen Steve Norris since November 2017.  He did see Steve Norris last September.  His last remote pacemaker download was in March 2018.  Patient was accompanied today by his children.  He was intensely preoccupied by his wife's illness when I last saw him.  It seems that she has passed away butI did not ask that question today.  Steve Norris has deteriorated a lot since I last saw him.  He is now living in the [facility] facility due to memory problems.  He has a DNR status. He has lost a lot of weight (22 pounds since last September) and is now frankly under nourished with a BMI of 19. He has had problems with poor oral intake, missing teeth and bleeding gums  His blood pressure is much lower.  He has not had recent syncope or falls.   Last August he developed seizures (negative CT/MRI) and he has been treated with Depakote.  He has had some behavioral disturbances.  He was hospitalized in April 2018 with acute diastolic heart failure and time his echo showed LV EF 60-65%.  He has not had any subsequent problems of heart failure.  He has been on anticoagulation with Eliquis 5 mg twice daily.    He saw Dr. Irene Limbo for anemia on 09/03 and he appropriately suggested a reduction in Eliquis dose.  Compared to June, his hemoglobin has increased from 8.0 to 9.4.  Erythrocyte indices are normocytic normochromic, but iron was low and transferrin saturation was only 8% on labs performed a  few days ago.  As far as I can tell, Steve Norris has not had problems with chest pain or dyspnea and is been troubled by edema, palpitations, new focal neurological deficits, bleeding problems, claudication or syncope.  Pacemaker interrogation shows some issues with atrial lead "noise"(SJM 2088 lead) , as well as sensor driven competitive pacing and atrial preference pacing.  Because of prolonged AV conduction times, he also has a lot of ventricular pacing with pseudofusion.  Appropriate adjustments were made to the sensor settings to be AV delay today to avoid these.  We have previously seen "noise" on his St. Jude 2088 atrial lead with multiple very brief episodes of artifactual mode switch. The noise can be reproduced with manipulation of the shoulder. His dual-chamber St. Jude accent DR RF device, which was implanted in 2011 has an estimated longevity of about another 1.7 years.  Initial ECG showed unnecessary ventricular pacing with pseudofusion and some occasional fusion.  The QTC on the tracing was reported as over 500 ms but I think that was an error.  After making the pacemaker settings adjustments.  ECG shows atrial paced, ventricular sensed rhythm with a QTC of 491 ms  He has long-standing COPD and chronic shortness of breath, functional class II-III. He has normal left ventricular systolic function (echo April 2018). In 2015  he underwent coronary angiography that showed widely patent Cypher stents in the LAD and RCA (placed 2006).  Past Medical History:  Diagnosis Date  . Arthritis   . Asthma   . Barrett's esophagus 03-19-2009   EGD  . CAD (coronary artery disease)    a. s/p stent placement in 2006 to the LAD and RCA  . COPD (chronic obstructive pulmonary disease) ([place])   . Dementia   . Diverticulosis 03-19-2009   Colonoscopy   . Erosive esophagitis 03-19-2009   EGD  . GERD (gastroesophageal reflux disease)   . Hiatal hernia 03-19-2009   EGD  . Hyperlipidemia   . Hypertension   .  Internal hemorrhoids 03-19-2009   Colonoscopy  . Paroxysmal atrial fibrillation (HCC)   . Sinus node dysfunction (HCC)    a. s/p PPM placement in 2011  . Subdural hematoma ([place]) 05/08/2017   small, left/notes 05/08/2017    Past Surgical History:  Procedure Laterality Date  . CARDIAC CATHETERIZATION  02/12/2009   mild ostial left main disease,patent LAD & RCA stents   . CARDIOVERSION N/A 11/06/2014   Procedure: CARDIOVERSION;  Surgeon: [name], MD;  Location: [facility] ENDOSCOPY;  Service: Cardiovascular;  Laterality: N/A;  . CARDIOVERSION N/A 08/02/2015   Procedure: CARDIOVERSION;  Surgeon: Sueanne Margarita, MD;  Location: [facility] ENDOSCOPY;  Service: Cardiovascular;  Laterality: N/A;  . CATARACT EXTRACTION    . COLONOSCOPY    . CORONARY ANGIOPLASTY WITH STENT PLACEMENT  2006   Right Coronary Artery Cypher stents placed 2006  . HEMORRHOID SURGERY    . INSERT / REPLACE / REMOVE PACEMAKER  07/25/2009   St.Jude Accent  . LEFT HEART CATHETERIZATION WITH CORONARY ANGIOGRAM N/A 07/01/2013   Procedure: LEFT HEART CATHETERIZATION WITH CORONARY ANGIOGRAM;  Surgeon: Sanda Klein, MD;  Location: [place] [facility] CATH LAB;  Service: Cardiovascular;  Laterality: N/A;  . NM MYOCAR PERF WALL MOTION  08/14/2008   no significant ischemia EF 64%  . TEE WITHOUT CARDIOVERSION N/A 08/02/2015   Procedure: TRANSESOPHAGEAL ECHOCARDIOGRAM (TEE);  Surgeon: Sueanne Margarita, MD;  Location[facility] END[facility]COPY;  Service: Cardiovascular;  Laterality: N/A;  . TONSILLECTOMY    . US ECHOCARDIOGRAPHY  07/12/2009   borderline LA enlargement,mild mitral annular ca+, AOV mildly sclerotic,trace AI.  Marland Kitchen VASECTOMY      Current Medications: Outpatient Medications Prior to Visit  Medication Sig Dispense Refill  . acetaminophen (TYLENOL) 500 MG tablet Take 500 mg by mouth 2 (two) times daily.     Marland Kitchen albuterol (ACCUNEB) 0.63 MG/3ML nebulizer solution Take 1 ampule by nebulization every 8 (eight) hours as needed for wheezing.    . Cholecalciferol (VITAMIN  D3) 2000 units capsule Take 4,000 Units by mouth daily.    . divalproex (DEPAKOTE) 250 MG DR tablet TAKE (1) TABLET BY MOUTH TWICE DAILY. 60 tablet 12  . ferrous sulfate (FEROSUL) 325 (65 FE) MG tablet Take 325 mg by mouth 2 (two) times daily with a meal.    . finasteride (PROSCAR) 5 MG tablet Take 5 mg by mouth daily.     . Fluticasone-Salmeterol (ADVAIR) 250-50 MCG/DOSE AEPB Inhale 1 puff into the lungs 2 (two) times daily.    Marland Kitchen lactulose (CHRONULAC) 10 GM/15ML solution Take 10 g by mouth 2 (two) times daily.     . mineral oil liquid Place 0.05 mLs in ear(s) once a week. On Thursday    . mirtazapine (REMERON) 7.5 MG tablet Take 7.5 mg by mouth at bedtime.    . nitroGLYCERIN (NITROSTAT) 0.4 MG SL tablet  Place 0.4 mg under the tongue every 5 (five) minutes as needed for chest pain.     Marland Kitchen Olopatadine HCl (PATADAY) 0.2 % SOLN Apply 1 drop to eye daily.    . potassium chloride SA (K-DUR,KLOR-CON) 20 MEQ tablet take 1 tablet by mouth once daily (Patient taking differently: 20 mEq by mouth daily) 30 tablet 6  . rosuvastatin (CRESTOR) 10 MG tablet Take 10 mg by mouth daily.    Marland Kitchen senna-docusate (SENNA-S) 8.6-50 MG per tablet Take 1 tablet by mouth as needed for mild constipation.     . sertraline (ZOLOFT) 50 MG tablet Take 25 mg by mouth daily.    . Skin Protectants, Misc. (MINERIN) CREA Apply 1 application topically at bedtime. To back    . sotalol (BETAPACE) 80 MG tablet take 1 tablet by mouth every 12 hours (Patient taking differently: 80 mg oral twice daily) 60 tablet 5  . tamsulosin (FLOMAX) 0.4 MG CAPS capsule Take 1 capsule by mouth daily.    . traMADol (ULTRAM) 50 MG tablet Take 25 mg by mouth every 12 (twelve) hours as needed (pain).    . Whey Protein POWD Take 1 Scoop by mouth 2 (two) times daily.    Marland Kitchen apixaban (ELIQUIS) 5 MG TABS tablet Take 5 mg by mouth 2 (two) times daily.    . furosemide (LASIX) 20 MG tablet Take 10 mg by mouth daily.    Marland Kitchen acetaminophen (TYLENOL) 500 MG tablet Take 1  tablet (500 mg total) by mouth every 6 (six) hours as needed. (Patient taking differently: Take 1,000 mg by mouth 2 (two) times daily. ) 30 tablet 0  . furosemide (LASIX) 20 MG tablet Take 1 tablet (20 mg total) by mouth daily. (Patient taking differently: Take 10 mg by mouth daily. ) 30 tablet 6  . Multiple Vitamins-Minerals (MULTIVITAMIN WITH MINERALS) tablet Take 1 tablet by mouth daily.    . sertraline (ZOLOFT) 100 MG tablet Take 25 mg by mouth daily.     . temazepam (RESTORIL) 15 MG capsule Take 15 mg by mouth at bedtime.     No facility-administered medications prior to visit.      Allergies:   Amiodarone; Bactrim [sulfamethoxazole-trimethoprim]; and Penicillins   Social History   Socioeconomic History  . Marital status: Married    Spouse name: Not on file  . Number of children: Not on file  . Years of education: Not on file  . Highest education level: Not on file  Occupational History  . Occupation: Retired   Scientific laboratory technician  . Financial resource strain: Not on file  . Food insecurity:    Worry: Not on file    Inability: Not on file  . Transportation needs:    Medical: Not on file    Non-medical: Not on file  Tobacco Use  . Smoking status: Former Research scientist (life sciences)  . Smokeless tobacco: Never Used  . Tobacco comment: Quit smoking 1996  Substance and Sexual Activity  . Alcohol use: No  . Drug use: No  . Sexual activity: Not on file  Lifestyle  . Physical activity:    Days per week: Not on file    Minutes per session: Not on file  . Stress: Not on file  Relationships  . Social connections:    Talks on phone: Not on file    Gets together: Not on file    Attends religious service: Not on file    Active member of club or organization: Not on file    Attends  meetings of clubs or organizations: Not on file    Relationship status: Not on file  Other Topics Concern  . Not on file  Social History Narrative   Daily caffeine      Family History:  The patient's family history  includes Heart disease in his father and mother.   ROS:   Please see the history of present illness.    ROS All other systems reviewed and are negative.   PHYSICAL EXAM:   VS:  BP (!) 102/52   Pulse 99   Ht 5\' 10"  (1.778 m)   Wt 136 lb 12.8 oz (62.1 kg)   BMI 19.63 kg/m    GEN: Well nourished, well developed, in no acute distress  HEENT: normal except for pallor Neck: no JVD, carotid bruits, or masses Cardiac: RRR; no murmurs, rubs, or gallops,no edema  Respiratory:  clear to auscultation bilaterally, normal work of breathing GI: soft, nontender, nondistended, + BS MS: no deformity or atrophy  Skin: warm and dry, no rash Neuro:  Alert and Oriented x 3, Strength and sensation are intact Psych: euthymic mood, full affect  Wt Readings from Last 3 Encounters:  01/01/18 136 lb 12.8 oz (62.1 kg)  12/29/17 136 lb 9.6 oz (62 kg)  10/06/17 180 lb (81.6 kg)      Studies/Labs Reviewed:   EKG:  EKG is ordered today and shows atrial paced, ventricular sensed rhythm, incomplete right bundle branch block, QTC 491 ms incomplete right bundle branch block. A single PVC is seen. QTC 453 ms  Recent Labs: 12/29/2017: ALT <6; BUN 12; Creatinine 0.71; Hemoglobin 9.4; Platelets 343; Potassium 4.2; Sodium 139; TSH 3.119    ASSESSMENT:    1. Atrial fibrillation with rapid ventricular response ([place])   2. Encounter for monitoring sotalol therapy   3. SSS (sick sinus syndrome) ([place])   4. Coronary artery disease involving native coronary artery of native heart without angina pectoris   5. Chronic diastolic heart failure ([place])   6. Long term (current) use of anticoagulants   7. S/P placement of cardiac pacemaker   8. Chronic obstructive pulmonary disease, unspecified COPD type ([place])   9. Iron deficiency anemia, unspecified iron deficiency anemia type   10. Cognitive decline      PLAN:  In order of problems listed above:    1. AFib: Low burden of paroxysmal atrial flutter/fibrillation (at  most 2%), substantially lower than that average over the last 4-5 months.  Nevertheless, he remains at high risk for embolic stroke: CHADSVasc 4 (age 9, HTN, CAD).  2. Sotalol: QTC 491 ms, in acceptable range. Continue sotalol with periodic review of QT interval, renal function and electrolytes (at least every 6 months).  Notes that he is also taking Remeron.  Avoid other agents that could further prolong the QT interval. 3. SSS: He requires nearly 100% atrial pacing.  He has preserved AV conduction.  His functional status has deteriorated tremendously since I last saw him.  Hard to assess adequacy of symptoms or settings, but it seems to me that his lifestyle is now almost entirely sedentary. 4. CAD: He does not report angina pectoris and has no ischemic ECG changes. After receiving his stents in 2006 has not had any clinical events. His coronary angiogram in 2015 showed the stents to be widely patent. 5. CHF: Previous echoes have shown evidence of diastolic dysfunction and elevated filling pressures, but today he does not affect his blood pressure is quite low.  I have recommended discontinuation of  the tiny dose of furosemide that he still takes. 6. Eliquis: I agree that the dose of anticoagulant should be reduced based on his age and low body mass.  Nevertheless, he still is at high risk of embolic stroke.   7. Anemia: Once available, will review all the recommendations in his hematologist's note.  Hopefully we can continue anticoagulation and correct the anemia with iron supplements. 8. PPM: Multiple adjustments were made to his pacemaker settings, but these are very unlikely to have any impact on him clinically.  We need to get reliable remote downloads every 3 months and make the necessary arrangements with the [facility]. Jude representative today. 9. COPD: This appears to be at baseline. He is not wheezing today. 10. Dementia: This is been by far the biggest change in Steve Norris's health.  I am not sure to what  degree the separation from his wife, the seizure disorder or just progression in underlying cognitive deficits has led to such a big change. DNR status is reasonable and appropriate.     Medication Adjustments/Labs and Tests Ordered: Current medicines are reviewed at length with the patient today.  Concerns regarding medicines are outlined above.  Medication changes, Labs and Tests ordered today are listed in the Patient Instructions below. Patient Instructions  Medication Instructions: Dr Sallyanne Kuster has recommended making the following medication changes: 1. DECREASE Eliquis to 2.5 mg twice daily 2. STOP Furosemide  Labwork: Your physician recommends that you return for lab work in 54 month.  Testing/Procedures: NONE ORDERED  Follow-up: Dr Sallyanne Kuster recommends that you schedule a follow-up appointment in 3 months with a pacemaker check.  If you need a refill on your cardiac medications before your next appointment, please call your pharmacy.    Signed, Sanda Klein, MD  01/02/2018 3:51 PM    [facility] [place], [place], [place]  [address] Phone: [phone]; Fax: [phone]

## 2018-01-02 ENCOUNTER — Encounter: Payer: Self-pay | Admitting: Hematology

## 2018-01-02 ENCOUNTER — Encounter: Payer: Self-pay | Admitting: Cardiovascular Disease

## 2018-01-04 ENCOUNTER — Encounter: Payer: Self-pay | Admitting: Hematology

## 2018-01-05 ENCOUNTER — Telehealth: Payer: Self-pay | Admitting: Hematology

## 2018-01-05 NOTE — Telephone Encounter (Signed)
Appts scheduled LMVM with date/time per 9/9 sch msg

## 2018-01-22 NOTE — Progress Notes (Signed)
HEMATOLOGY/ONCOLOGY CONSULTATION NOTE  Date of Service: 01/25/2018  Patient Care Team: Deland Pretty, MD as PCP - General (Internal Medicine)  CHIEF COMPLAINTS/PURPOSE OF CONSULTATION:  Anemia IgM monoclonal paraproteinemia  HISTORY OF PRESENTING ILLNESS:   Steve Norris is a wonderful 84 y.o. male who has been referred to [place] by Dr. Deland Pretty for evaluation and management of Anemia. He is accompanied today by his son and daughter-in-law. The pt reports that he is doing well overall.   Prior to our visit, the pt presented to the ED on 10/05/17 after persistent gum bleeding developed on the day he had teeth extracted. The pt was on Eliquis for his Afib and was told to hold this. Labs drawn at ED visit showed HGB at 8.0, and labs drawn two months later on 12/10/17 showed that HGB increased to 9.0. The pt began PO Iron replacement in that interim.   The pt reports that he has not noticed any blood in the stools, nose bleeds, and other concerns of bleeding recently.   The pt has lost 30 pounds in the last year and a half. The pt has full dentures, but has not been able to wear them recently, which affects his food consumption. The pt's family notes that he will likely not be getting new dentures soon, and that he is trying to eat softer foods.   The pt denies any new back pain, chest pain or abdominal pains.   The pt notes that he has been taking Eliquis for a long time. He denies receiving any blood transfusions. He notes that he has had more fatigue. He also notes that he has light headedness when he stands up. The pt's family notes that a stool study is trying to be collected at the pt's living facility but not successfully so far.   The pt' son notes that his father has rapidly declined in functional status over the last year and a half. The son and daughter-in-law also note that the goals of care are not for invasive testing and would focus on maintaining quality of life.   Most  recent lab results (11/30/17) of CBC w/diff is as follows: all values are WNL except for RBC at 3.03, HGB at 9.0, HCT at 28.7, MCHC at 31.4, Neut% at 34.7%, Mono% at 20.2%, Eos% at 7.5%. 11/30/17 Iron and TIBC revealed TIBC at 197, Iron at 15, and an 8% Iron Saturation.  On review of systems, pt reports fatigue, weight loss, light headedness when standing, and denies chest pain, back pain, abdominal pains, blood in the stools, dizziness, leg swelling, and any other symptoms.   On PMHx the pt reports renal cystic disease, neoplasm of genitourinary organs, urinary obstruction, CAD s/p cypher stent 2006 RCA and LAD, Hypercholesterolemia, Afib on anticoagulation, COPD, Gout, HTN, Low Vitamin D, Insomnia, Barrett's esophagus, dual-chamber permanent pacemaker. Tonsillectomy, Vasectomy, hemorrhoidectomy.  Interval History:   MAANAV KASSABIAN returns today for management and evaluation of his anemia. The patient's last visit with [place] was on 12/29/17. He is accompanied today by his son and daughter-in-law. The pt reports that he is doing well overall.   The pt reports that he has been taking Ferrous sulfate since July. In conversation with the pt and family about his goals of care and my concerns for a possible lymphoplasmacytic lymphoma, the pt notes that he would like to pursue a more conservative approach and waiting and watching. He notes that he would like to pursue IV iron replacement and not  a BM Bx at this time.   The pt notes that his Eliquis dose was reduced from '5mg'$  to 2.'5mg'$  as previously recommended.   The pt has gained 10 pounds in the interim and notes that he has been eating better.   Lab results (12/29/17) of CBC w/diff, CMP, and Reticulocytes is as follows: all values are WNL except for RBC at 3.28, HGB at 9.4, HCT at 30.3, MCHC at 31.0, RDW at 18.7, Lymphs abs at 3.5k, Monocytes abs at 1.1k, Glucose at 119, Calcium at 8.7, Total Protein at 8.7, Albumin at 2.0.  On review of systems, pt reports weight  gain, stable low energy levels, eating better, and denies concerns for blood loss, and any other symptoms.   MEDICAL HISTORY:  Past Medical History:  Diagnosis Date  . Arthritis   . Asthma   . Barrett's esophagus 03-19-2009   EGD  . CAD (coronary artery disease)    a. s/p stent placement in 2006 to the LAD and RCA  . COPD (chronic obstructive pulmonary disease) ([place])   . Dementia   . Diverticulosis 03-19-2009   Colonoscopy   . Erosive esophagitis 03-19-2009   EGD  . GERD (gastroesophageal reflux disease)   . Hiatal hernia 03-19-2009   EGD  . Hyperlipidemia   . Hypertension   . Internal hemorrhoids 03-19-2009   Colonoscopy  . Paroxysmal atrial fibrillation (HCC)   . Sinus node dysfunction (HCC)    a. s/p PPM placement in 2011  . Subdural hematoma ([place]) 05/08/2017   small, left/notes 05/08/2017    SURGICAL HISTORY: Past Surgical History:  Procedure Laterality Date  . CARDIAC CATHETERIZATION  02/12/2009   mild ostial left main disease,patent LAD & RCA stents   . CARDIOVERSION N/A 11/06/2014   Procedure: CARDIOVERSION;  Surgeon: [name], MD;  Location: [facility] ENDOSCOPY;  Service: Cardiovascular;  Laterality: N/A;  . CARDIOVERSION N/A 08/02/2015   Procedure: CARDIOVERSION;  Surgeon: Sueanne Margarita, MD;  Location: [facility] ENDOSCOPY;  Service: Cardiovascular;  Laterality: N/A;  . CATARACT EXTRACTION    . COLONOSCOPY    . CORONARY ANGIOPLASTY WITH STENT PLACEMENT  2006   Right Coronary Artery Cypher stents placed 2006  . HEMORRHOID SURGERY    . INSERT / REPLACE / REMOVE PACEMAKER  07/25/2009   [employer] [facility]  . LEFT HEART CATHETERIZATION WITH CORONARY ANGIOGRAM N/A 07/01/2013   Procedure: LEFT HEART CATHETERIZATION WITH CORONARY ANGIOGRAM;  Surgeon: Sanda Klein, MD;  Location: [facility] CATH LAB;  Service: Cardiovascular;  Laterality: N/A;  . NM MYOCAR PERF WALL MOTION  08/14/2008   no significant ischemia EF 64%  . TEE WITHOUT CARDIOVERSION N/A 08/02/2015   Procedure: TRANSESOPHAGEAL  ECHOCARDIOGRAM (TEE);  Surgeon: Sueanne Margarita, MD;  Location: [facility] ENDOSCOPY;  Service: Cardiovascular;  Laterality: N/A;  . TONSILLECTOMY    . US ECHOCARDIOGRAPHY  07/12/2009   borderline LA enlargement,mild mitral annular ca+, AOV mildly sclerotic,trace AI.  Marland Kitchen VASECTOMY      SOCIAL HISTORY: Social History   Socioeconomic History  . Marital status: Married    Spouse name: Not on file  . Number of children: Not on file  . Years of education: Not on file  . Highest education level: Not on file  Occupational History  . Occupation: Retired   Scientific laboratory technician  . Financial resource strain: Not on file  . Food insecurity:    Worry: Not on file    Inability: Not on file  . Transportation needs:    Medical: Not on file  Non-medical: Not on file  Tobacco Use  . Smoking status: Former Research scientist (life sciences)  . Smokeless tobacco: Never Used  . Tobacco comment: Quit smoking 1996  Substance and Sexual Activity  . Alcohol use: No  . Drug use: No  . Sexual activity: Not on file  Lifestyle  . Physical activity:    Days per week: Not on file    Minutes per session: Not on file  . Stress: Not on file  Relationships  . Social connections:    Talks on phone: Not on file    Gets together: Not on file    Attends religious service: Not on file    Active member of club or organization: Not on file    Attends meetings of clubs or organizations: Not on file    Relationship status: Not on file  . Intimate partner violence:    Fear of current or ex partner: Not on file    Emotionally abused: Not on file    Physically abused: Not on file    Forced sexual activity: Not on file  Other Topics Concern  . Not on file  Social History Narrative   Daily caffeine     FAMILY HISTORY: Family History  Problem Relation Age of Onset  . Heart disease Mother   . Heart disease Father   . Colon cancer Neg Hx     ALLERGIES:  is allergic to amiodarone; bactrim [sulfamethoxazole-trimethoprim]; and  penicillins.  MEDICATIONS:  Current Outpatient Medications  Medication Sig Dispense Refill  . acetaminophen (TYLENOL) 500 MG tablet Take 500 mg by mouth 2 (two) times daily.     Marland Kitchen albuterol (ACCUNEB) 0.63 MG/3ML nebulizer solution Take 1 ampule by nebulization every 8 (eight) hours as needed for wheezing.    Marland Kitchen apixaban (ELIQUIS) 2.5 MG TABS tablet Take 1 tablet (2.5 mg total) by mouth 2 (two) times daily. 90 tablet 3  . Cholecalciferol (VITAMIN D3) 2000 units capsule Take 4,000 Units by mouth daily.    . divalproex (DEPAKOTE) 250 MG DR tablet TAKE (1) TABLET BY MOUTH TWICE DAILY. 60 tablet 12  . ferrous sulfate (FEROSUL) 325 (65 FE) MG tablet Take 325 mg by mouth 2 (two) times daily with a meal.    . finasteride (PROSCAR) 5 MG tablet Take 5 mg by mouth daily.     . Fluticasone-Salmeterol (ADVAIR) 250-50 MCG/DOSE AEPB Inhale 1 puff into the lungs 2 (two) times daily.    Marland Kitchen lactulose (CHRONULAC) 10 GM/15ML solution Take 10 g by mouth 2 (two) times daily.     . mineral oil liquid Place 0.05 mLs in ear(s) once a week. On Thursday    . mirtazapine (REMERON) 7.5 MG tablet Take 7.5 mg by mouth at bedtime.    . nitroGLYCERIN (NITROSTAT) 0.4 MG SL tablet Place 0.4 mg under the tongue every 5 (five) minutes as needed for chest pain.     Marland Kitchen Olopatadine HCl (PATADAY) 0.2 % SOLN Apply 1 drop to eye daily.    . potassium chloride SA (K-DUR,KLOR-CON) 20 MEQ tablet take 1 tablet by mouth once daily (Patient taking differently: 20 mEq by mouth daily) 30 tablet 6  . rosuvastatin (CRESTOR) 10 MG tablet Take 10 mg by mouth daily.    Marland Kitchen senna-docusate (SENNA-S) 8.6-50 MG per tablet Take 1 tablet by mouth as needed for mild constipation.     . sertraline (ZOLOFT) 50 MG tablet Take 25 mg by mouth daily.    . Skin Protectants, Misc. (MINERIN) CREA Apply 1 application topically  at bedtime. To back    . sotalol (BETAPACE) 80 MG tablet take 1 tablet by mouth every 12 hours (Patient taking differently: 80 mg oral twice  daily) 60 tablet 5  . tamsulosin (FLOMAX) 0.4 MG CAPS capsule Take 1 capsule by mouth daily.    . traMADol (ULTRAM) 50 MG tablet Take 25 mg by mouth every 12 (twelve) hours as needed (pain).    . Whey Protein POWD Take 1 Scoop by mouth 2 (two) times daily.     No current facility-administered medications for this visit.     REVIEW OF SYSTEMS:    A 10+ POINT REVIEW OF SYSTEMS WAS OBTAINED including neurology, dermatology, psychiatry, cardiac, respiratory, lymph, extremities, GI, GU, Musculoskeletal, constitutional, breasts, reproductive, HEENT.  All pertinent positives are noted in the HPI.  All others are negative.   PHYSICAL EXAMINATION: ECOG PERFORMANCE STATUS: 3 - Symptomatic, >50% confined to bed  . Vitals:   01/25/18 1517  BP: 129/67  Pulse: 91  Resp: 18  Temp: 97.8 F (36.6 C)  SpO2: 95%   Filed Weights   01/25/18 1517  Weight: 146 lb 12.8 oz (66.6 kg)   .Body mass index is 21.06 kg/m.  GENERAL:alert, in no acute distress and comfortable SKIN: no acute rashes, no significant lesions EYES: conjunctiva are pink and non-injected, sclera anicteric OROPHARYNX: MMM, no exudates, no oropharyngeal erythema or ulceration NECK: supple, no JVD LYMPH:  no palpable lymphadenopathy in the cervical, axillary or inguinal regions LUNGS: clear to auscultation b/l with normal respiratory effort HEART: regular rate & rhythm ABDOMEN:  normoactive bowel sounds , non tender, not distended. No palpable hepatosplenomegaly.  Extremity: no pedal edema PSYCH: alert & oriented x 3 with fluent speech NEURO: no focal motor/sensory deficits   LABORATORY DATA:  I have reviewed the data as listed  . CBC Latest Ref Rng & Units 12/29/2017 12/29/2017 10/06/2017  WBC 4.0 - 10.3 K/uL 8.2 - 7.9  Hemoglobin 13.0 - 17.1 g/dL 9.4(L) - 8.0(L)  Hematocrit 37.5 - 51.0 % 30.3(L) 29.2(L) 25.2(L)  Platelets 140 - 400 K/uL 343 - 303   . CBC    Component Value Date/Time   WBC 8.2 12/29/2017 1406   RBC 3.28  (L) 12/29/2017 1406   HGB 9.4 (L) 12/29/2017 1406   HGB 11.4 (L) 12/31/2016 1440   HCT 30.3 (L) 12/29/2017 1406   HCT 29.2 (L) 12/29/2017 1406   PLT 343 12/29/2017 1406   PLT 212 12/31/2016 1440   MCV 92.4 12/29/2017 1406   MCV 87 12/31/2016 1440   MCH 28.7 12/29/2017 1406   MCHC 31.0 (L) 12/29/2017 1406   RDW 18.7 (H) 12/29/2017 1406   RDW 18.4 (H) 12/31/2016 1440   LYMPHSABS 3.5 (H) 12/29/2017 1406   MONOABS 1.1 (H) 12/29/2017 1406   EOSABS 0.5 12/29/2017 1406   BASOSABS 0.0 12/29/2017 1406   . CMP Latest Ref Rng & Units 12/29/2017 05/09/2017 05/08/2017  Glucose 70 - 99 mg/dL 119(H) 90 105(H)  BUN 8 - 23 mg/dL '12 12 20  '$ Creatinine 0.61 - 1.24 mg/dL 0.71 0.45(L) 0.59(L)  Sodium 135 - 145 mmol/L 139 136 135  Potassium 3.5 - 5.1 mmol/L 4.2 3.8 4.0  Chloride 98 - 111 mmol/L 103 104 102  CO2 22 - 32 mmol/L '29 27 28  '$ Calcium 8.9 - 10.3 mg/dL 8.7(L) 8.0(L) 8.2(L)  Total Protein 6.5 - 8.1 g/dL 8.7(H) - -  Total Bilirubin 0.3 - 1.2 mg/dL 0.3 - -  Alkaline Phos 38 - 126 U/L 88 - -  AST 15 - 41 U/L 17 - -  ALT 0 - 44 U/L <6 - -   Lab Results  Component Value Date   LDH 81 (L) 12/29/2017   Haptglobin WNL  Component     Latest Ref Rng & Units 12/29/2017  IgG (Immunoglobin G), Serum     700 - 1,600 mg/dL 2,924 (H)  IgA     61 - 437 mg/dL 276  IgM (Immunoglobulin M), Srm     15 - 143 mg/dL 2,020 (H)  Total Protein ELP     6.0 - 8.5 g/dL 8.1  Albumin SerPl Elph-Mcnc     2.9 - 4.4 g/dL 2.4 (L)  Alpha 1     0.0 - 0.4 g/dL 0.4  Alpha2 Glob SerPl Elph-Mcnc     0.4 - 1.0 g/dL 0.7  B-Globulin SerPl Elph-Mcnc     0.7 - 1.3 g/dL 0.8  Gamma Glob SerPl Elph-Mcnc     0.4 - 1.8 g/dL 3.7 (H)  M Protein SerPl Elph-Mcnc     Not Observed g/dL 1.5 (H)  Globulin, Total     2.2 - 3.9 g/dL 5.7 (H)  Albumin/Glob SerPl     0.7 - 1.7 0.5 (L)  IFE 1      Comment  Please Note (HCV):      Comment  Iron     42 - 163 ug/dL 16 (L)  TIBC     202 - 409 ug/dL 188 (L)  Saturation Ratios     42  - 163 % 8 (L)  UIBC     ug/dL 172  Folate, Hemolysate     Not Estab. ng/mL 454.6  HCT     37.5 - 51.0 % 29.2 (L)  Folate, RBC     >498 ng/mL 1,557  Kappa free light chain     3.3 - 19.4 mg/L 482.7 (H)  Lamda free light chains     5.7 - 26.3 mg/L 80.7 (H)  Kappa, lamda light chain ratio     0.26 - 1.65 5.98 (H)  Ferritin     24 - 336 ng/mL 47  Vitamin B12     180 - 914 pg/mL 471  LDH     98 - 192 U/L 81 (L)  Haptoglobin     34 - 200 mg/dL 209 (H)  TSH     0.320 - 4.118 uIU/mL 3.119   Multiple Myeloma Panel (SPEP&IFE w/QIG) The value has a corrected status. No reference range information available Resulting Lab: [facility] Comments: (NOTE) Immunofixation shows IgM monoclonal protein with kappa light chain specificity. An apparent polyclonal gammopathy: IgG. Kappa and lambda typing appear increased.    11/30/17 CBC w/diff, CMP, Iron and TIBC:     RADIOGRAPHIC STUDIES: I have personally reviewed the radiological images as listed and agreed with the findings in the report. No results found.  ASSESSMENT & PLAN:  84 y.o. male with   1. Normocytic Anemia ? Related to blood loss from gum bleeding. Patient is on chronic anticoag for afib and this is an additional risk factor for slow GI losses as well.  2. IgM kappa monoclonal protein  12/29/17 M Protein at 1.5 with IgM kappa specificity Cannot rule out Lymphoplasmacytic lymphoma   PLAN:  -Patient's most recent labs upon initial presentationfrom 11/30/17, HGB at 9.0 -09/18/16 Labs showed HGB at 10.3, and 04/23/16 labs showed HGB at 11.2 -Weight loss is potentially a function of inability to use dentures - which might affect po food  intake. -hgb somewhat better at 9.4 on repeat lab from 12/29/17 -Will order blood transfusion for HGB <8 -consideration of GI w/u based on goals of care - will defer decision to PCP. -Discussed pt labwork from 12/29/17; LDH was at 81, thyroid functions were normal, M Protein at 1.5  of IgM Kappa specificity, IgM elevated at 2020. IgG elevated at 2924 -Discussed that the patient's blood thinner use could be risk factor for iron deficiency -Discussed the IgM Kappa monoclonal protein is concerning in the context of a worsening anemia without other overt indications for the patient's anemia and my concern of a lymphoplasmacytic lymphoma  -Discussed that potential work up will be informed by the patient's goals of care and could include watching and supporting pt with supportive therapies vs BM biopsy and PET/CT imaging and considerations for treatment  -Could replace Iron IV and watch labs, to rule out iron deficiency as cause of anemia, the pt and family prefer to pursue this -Will provide a referral to our nutritional therapist and recommend that the pt continue to consume Boost or Ensure  -Will order IV Injectafer x 2 doses, and pt is fine to hold PO Iron at this time -Will check labs again in 3 months   . No orders of the defined types were placed in this encounter.   2) . Patient Active Problem List   Diagnosis Date Noted  . Iron deficiency anemia 01/25/2018  . Palliative care encounter 08/24/2017  . Subdural hematoma ([place]) 05/08/2017  . New onset seizure ([place]) 12/23/2016  . Acute left otitis media 12/23/2016  . Acute encephalopathy 12/23/2016  . PAF (paroxysmal atrial fibrillation) ([place]) 12/23/2016  . Benign essential HTN 12/23/2016  . Chronic diastolic heart failure, NYHA class 2 ([place]) 12/23/2016  . Hyperlipidemia 12/23/2016  . Dementia 12/23/2016  . Asthma 12/23/2016  . Elevated AST (SGOT) 12/23/2016  . Acute diastolic CHF (congestive heart failure) ([place])   . Pressure injury of skin 08/09/2016  . BPH (benign prostatic hyperplasia) 08/09/2016  . HTN (hypertension) 08/09/2016  . CHF exacerbation ([place]) 08/08/2016  . Anemia 03/25/2016  . SSS (sick sinus syndrome) ([place]) 03/25/2016  . CAP (community acquired pneumonia)   . Atrial fibrillation with rapid  ventricular response ([place]) 11/04/2014  . Dyspnea 12/15/2012  . Long term current use of anticoagulant therapy 07/14/2012  . COPD (chronic obstructive pulmonary disease) ([place]) 02/23/2012  . CAD (coronary artery disease) 02/23/2012  . S/P placement of cardiac pacemaker 02/23/2012  . Atrial fibrillation ([place]) 02/23/2012  . Barrett's esophagus 04/30/2009   -continue f/u with PCP for mx of medical co-morbidities.   IV injectafer weekly x 2 doses RTC with Dr Irene Limbo in 3 months with labs Nutritional consult with Ernestene Kiel - weight loss with [employer] MG    All of the patients questions were answered with apparent satisfaction. The patient knows to call the clinic with any problems, questions or concerns.  The total time spent in the appt was 30 minutes and more than 50% was on counseling and direct patient cares.      Sullivan Lone MD MS AAHIVMS [facility] Hematology/Oncology Physician [facility]  (Office):       [phone] (Work cell):  [phone] (Fax):           [phone]  01/25/2018 4:18 PM   I, Baldwin [place], am acting as a scribe for Dr. Irene Limbo  .I have reviewed the above documentation for accuracy and completeness, and I agree with the above. Brunetta Genera  MD

## 2018-01-25 ENCOUNTER — Inpatient Hospital Stay: Payer: Medicare Other | Admitting: Hematology

## 2018-01-25 ENCOUNTER — Telehealth: Payer: Self-pay | Admitting: Hematology

## 2018-01-25 VITALS — BP 129/67 | HR 91 | Temp 97.8°F | Resp 18 | Ht 70.0 in | Wt 146.8 lb

## 2018-01-25 DIAGNOSIS — R5383 Other fatigue: Secondary | ICD-10-CM | POA: Diagnosis not present

## 2018-01-25 DIAGNOSIS — K2270 Barrett's esophagus without dysplasia: Secondary | ICD-10-CM

## 2018-01-25 DIAGNOSIS — Z87891 Personal history of nicotine dependence: Secondary | ICD-10-CM

## 2018-01-25 DIAGNOSIS — D509 Iron deficiency anemia, unspecified: Secondary | ICD-10-CM

## 2018-01-25 DIAGNOSIS — D472 Monoclonal gammopathy: Secondary | ICD-10-CM

## 2018-01-25 DIAGNOSIS — D649 Anemia, unspecified: Secondary | ICD-10-CM

## 2018-01-25 DIAGNOSIS — D500 Iron deficiency anemia secondary to blood loss (chronic): Secondary | ICD-10-CM | POA: Diagnosis not present

## 2018-01-25 DIAGNOSIS — I5032 Chronic diastolic (congestive) heart failure: Secondary | ICD-10-CM

## 2018-01-25 DIAGNOSIS — J449 Chronic obstructive pulmonary disease, unspecified: Secondary | ICD-10-CM

## 2018-01-25 DIAGNOSIS — E559 Vitamin D deficiency, unspecified: Secondary | ICD-10-CM

## 2018-01-25 DIAGNOSIS — I110 Hypertensive heart disease with heart failure: Secondary | ICD-10-CM

## 2018-01-25 DIAGNOSIS — G4700 Insomnia, unspecified: Secondary | ICD-10-CM

## 2018-01-25 DIAGNOSIS — K219 Gastro-esophageal reflux disease without esophagitis: Secondary | ICD-10-CM

## 2018-01-25 DIAGNOSIS — I2510 Atherosclerotic heart disease of native coronary artery without angina pectoris: Secondary | ICD-10-CM

## 2018-01-25 DIAGNOSIS — K449 Diaphragmatic hernia without obstruction or gangrene: Secondary | ICD-10-CM

## 2018-01-25 DIAGNOSIS — I480 Paroxysmal atrial fibrillation: Secondary | ICD-10-CM

## 2018-01-25 DIAGNOSIS — I495 Sick sinus syndrome: Secondary | ICD-10-CM

## 2018-01-25 DIAGNOSIS — M109 Gout, unspecified: Secondary | ICD-10-CM

## 2018-01-25 DIAGNOSIS — E7800 Pure hypercholesterolemia, unspecified: Secondary | ICD-10-CM

## 2018-01-25 DIAGNOSIS — Z79899 Other long term (current) drug therapy: Secondary | ICD-10-CM

## 2018-01-25 DIAGNOSIS — M129 Arthropathy, unspecified: Secondary | ICD-10-CM

## 2018-01-25 DIAGNOSIS — R634 Abnormal weight loss: Secondary | ICD-10-CM

## 2018-01-25 DIAGNOSIS — F0390 Unspecified dementia without behavioral disturbance: Secondary | ICD-10-CM

## 2018-01-25 NOTE — Telephone Encounter (Signed)
Appts scheduled avs/calendar printed per 9/30 los °

## 2018-01-27 ENCOUNTER — Encounter: Payer: Self-pay | Admitting: Hematology

## 2018-01-29 ENCOUNTER — Inpatient Hospital Stay: Payer: Medicare Other | Attending: Hematology

## 2018-01-29 ENCOUNTER — Inpatient Hospital Stay: Payer: Medicare Other

## 2018-01-29 VITALS — BP 109/63 | HR 84 | Temp 97.8°F | Resp 20

## 2018-01-29 DIAGNOSIS — D500 Iron deficiency anemia secondary to blood loss (chronic): Secondary | ICD-10-CM | POA: Diagnosis present

## 2018-01-29 DIAGNOSIS — Z79899 Other long term (current) drug therapy: Secondary | ICD-10-CM | POA: Diagnosis not present

## 2018-01-29 DIAGNOSIS — D509 Iron deficiency anemia, unspecified: Secondary | ICD-10-CM

## 2018-01-29 MED ORDER — SODIUM CHLORIDE 0.9 % IV SOLN
750.0000 mg | Freq: Once | INTRAVENOUS | Status: AC
  Administered 2018-01-29: 750 mg via INTRAVENOUS
  Filled 2018-01-29: qty 15

## 2018-01-29 MED ORDER — SODIUM CHLORIDE 0.9 % IV SOLN
Freq: Once | INTRAVENOUS | Status: AC
  Administered 2018-01-29: 15:00:00 via INTRAVENOUS
  Filled 2018-01-29: qty 250

## 2018-01-29 NOTE — Patient Instructions (Signed)
Ferric carboxymaltose injection What is this medicine? FERRIC CARBOXYMALTOSE (ferr-ik car-box-ee-mol-toes) is an iron complex. Iron is used to make healthy red blood cells, which carry oxygen and nutrients throughout the body. This medicine is used to treat anemia in people with chronic kidney disease or people who cannot take iron by mouth. This medicine may be used for other purposes; ask your health care provider or pharmacist if you have questions. COMMON BRAND NAME(S): Injectafer What should I tell my health care provider before I take this medicine? They need to know if you have any of these conditions: -anemia not caused by low iron levels -high levels of iron in the blood -liver disease -an unusual or allergic reaction to iron, other medicines, foods, dyes, or preservatives -pregnant or trying to get pregnant -breast-feeding How should I use this medicine? This medicine is for infusion into a vein. It is given by a health care professional in a hospital or clinic setting. Talk to your pediatrician regarding the use of this medicine in children. Special care may be needed. Overdosage: If you think you have taken too much of this medicine contact a poison control center or emergency room at once. NOTE: This medicine is only for you. Do not share this medicine with others. What if I miss a dose? It is important not to miss your dose. Call your doctor or health care professional if you are unable to keep an appointment. What may interact with this medicine? Do not take this medicine with any of the following medications: -deferoxamine -dimercaprol -other iron products This medicine may also interact with the following medications: -chloramphenicol -deferasirox This list may not describe all possible interactions. Give your health care provider a list of all the medicines, herbs, non-prescription drugs, or dietary supplements you use. Also tell them if you smoke, drink alcohol, or use  illegal drugs. Some items may interact with your medicine. What should I watch for while using this medicine? Visit your doctor or health care professional regularly. Tell your doctor if your symptoms do not start to get better or if they get worse. You may need blood work done while you are taking this medicine. You may need to follow a special diet. Talk to your doctor. Foods that contain iron include: whole grains/cereals, dried fruits, beans, or peas, leafy green vegetables, and organ meats (liver, kidney). What side effects may I notice from receiving this medicine? Side effects that you should report to your doctor or health care professional as soon as possible: -allergic reactions like skin rash, itching or hives, swelling of the face, lips, or tongue -breathing problems -changes in blood pressure -feeling faint or lightheaded, falls -flushing, sweating, or hot feelings Side effects that usually do not require medical attention (report to your doctor or health care professional if they continue or are bothersome): -changes in taste -constipation -dizziness -headache -nausea -pain, redness, or irritation at site where injected -vomiting This list may not describe all possible side effects. Call your doctor for medical advice about side effects. You may report side effects to FDA at 1-800-FDA-1088. Where should I keep my medicine? This drug is given in a hospital or clinic and will not be stored at home. NOTE: This sheet is a summary. It may not cover all possible information. If you have questions about this medicine, talk to your doctor, pharmacist, or health care provider.  2018 Elsevier/Gold Standard (2015-05-17 11:20:47)  

## 2018-02-02 ENCOUNTER — Other Ambulatory Visit: Payer: Self-pay | Admitting: Hematology

## 2018-02-02 ENCOUNTER — Encounter: Payer: Self-pay | Admitting: Hematology

## 2018-02-03 ENCOUNTER — Telehealth: Payer: Self-pay | Admitting: *Deleted

## 2018-02-03 ENCOUNTER — Other Ambulatory Visit: Payer: Self-pay

## 2018-02-03 DIAGNOSIS — D509 Iron deficiency anemia, unspecified: Secondary | ICD-10-CM

## 2018-02-03 NOTE — Telephone Encounter (Signed)
Left VM for son, Cyncere Sontag [facility]) that Dr. Irene Limbo has ordered a CBC for the patient to be drawn at lab at [facility] on 02/05/18, appt is at 3pm prior to 3:30 appt in infusion room.

## 2018-02-04 ENCOUNTER — Inpatient Hospital Stay: Payer: Medicare Other

## 2018-02-05 ENCOUNTER — Inpatient Hospital Stay: Payer: Medicare Other

## 2018-02-05 ENCOUNTER — Other Ambulatory Visit: Payer: Self-pay | Admitting: Hematology

## 2018-02-05 VITALS — BP 109/82 | HR 87 | Temp 97.8°F | Resp 19

## 2018-02-05 DIAGNOSIS — D509 Iron deficiency anemia, unspecified: Secondary | ICD-10-CM

## 2018-02-05 DIAGNOSIS — D472 Monoclonal gammopathy: Secondary | ICD-10-CM

## 2018-02-05 DIAGNOSIS — D500 Iron deficiency anemia secondary to blood loss (chronic): Secondary | ICD-10-CM | POA: Diagnosis not present

## 2018-02-05 LAB — CBC WITH DIFFERENTIAL (CANCER CENTER ONLY)
ABS IMMATURE GRANULOCYTES: 0.02 10*3/uL (ref 0.00–0.07)
Basophils Absolute: 0.1 10*3/uL (ref 0.0–0.1)
Basophils Relative: 1 %
EOS PCT: 7 %
Eosinophils Absolute: 0.5 10*3/uL (ref 0.0–0.5)
HCT: 28.1 % — ABNORMAL LOW (ref 39.0–52.0)
Hemoglobin: 8.5 g/dL — ABNORMAL LOW (ref 13.0–17.0)
IMMATURE GRANULOCYTES: 0 %
LYMPHS PCT: 44 %
Lymphs Abs: 3.1 10*3/uL (ref 0.7–4.0)
MCH: 29.4 pg (ref 26.0–34.0)
MCHC: 30.2 g/dL (ref 30.0–36.0)
MCV: 97.2 fL (ref 80.0–100.0)
Monocytes Absolute: 1.3 10*3/uL — ABNORMAL HIGH (ref 0.1–1.0)
Monocytes Relative: 18 %
NEUTROS ABS: 2.1 10*3/uL (ref 1.7–7.7)
NEUTROS PCT: 30 %
NRBC: 0 % (ref 0.0–0.2)
PLATELETS: 263 10*3/uL (ref 150–400)
RBC: 2.89 MIL/uL — AB (ref 4.22–5.81)
RDW: 22.8 % — ABNORMAL HIGH (ref 11.5–15.5)
WBC: 7.1 10*3/uL (ref 4.0–10.5)

## 2018-02-05 MED ORDER — SODIUM CHLORIDE 0.9 % IV SOLN
750.0000 mg | Freq: Once | INTRAVENOUS | Status: AC
  Administered 2018-02-05: 750 mg via INTRAVENOUS
  Filled 2018-02-05: qty 15

## 2018-02-05 MED ORDER — SODIUM CHLORIDE 0.9 % IV SOLN
Freq: Once | INTRAVENOUS | Status: AC
  Administered 2018-02-05: 16:00:00 via INTRAVENOUS
  Filled 2018-02-05: qty 250

## 2018-02-05 NOTE — Patient Instructions (Signed)
Ferric carboxymaltose injection What is this medicine? FERRIC CARBOXYMALTOSE (ferr-ik car-box-ee-mol-toes) is an iron complex. Iron is used to make healthy red blood cells, which carry oxygen and nutrients throughout the body. This medicine is used to treat anemia in people with chronic kidney disease or people who cannot take iron by mouth. This medicine may be used for other purposes; ask your health care provider or pharmacist if you have questions. COMMON BRAND NAME(S): Injectafer What should I tell my health care provider before I take this medicine? They need to know if you have any of these conditions: -anemia not caused by low iron levels -high levels of iron in the blood -liver disease -an unusual or allergic reaction to iron, other medicines, foods, dyes, or preservatives -pregnant or trying to get pregnant -breast-feeding How should I use this medicine? This medicine is for infusion into a vein. It is given by a health care professional in a hospital or clinic setting. Talk to your pediatrician regarding the use of this medicine in children. Special care may be needed. Overdosage: If you think you have taken too much of this medicine contact a poison control center or emergency room at once. NOTE: This medicine is only for you. Do not share this medicine with others. What if I miss a dose? It is important not to miss your dose. Call your doctor or health care professional if you are unable to keep an appointment. What may interact with this medicine? Do not take this medicine with any of the following medications: -deferoxamine -dimercaprol -other iron products This medicine may also interact with the following medications: -chloramphenicol -deferasirox This list may not describe all possible interactions. Give your health care provider a list of all the medicines, herbs, non-prescription drugs, or dietary supplements you use. Also tell them if you smoke, drink alcohol, or use  illegal drugs. Some items may interact with your medicine. What should I watch for while using this medicine? Visit your doctor or health care professional regularly. Tell your doctor if your symptoms do not start to get better or if they get worse. You may need blood work done while you are taking this medicine. You may need to follow a special diet. Talk to your doctor. Foods that contain iron include: whole grains/cereals, dried fruits, beans, or peas, leafy green vegetables, and organ meats (liver, kidney). What side effects may I notice from receiving this medicine? Side effects that you should report to your doctor or health care professional as soon as possible: -allergic reactions like skin rash, itching or hives, swelling of the face, lips, or tongue -breathing problems -changes in blood pressure -feeling faint or lightheaded, falls -flushing, sweating, or hot feelings Side effects that usually do not require medical attention (report to your doctor or health care professional if they continue or are bothersome): -changes in taste -constipation -dizziness -headache -nausea -pain, redness, or irritation at site where injected -vomiting This list may not describe all possible side effects. Call your doctor for medical advice about side effects. You may report side effects to FDA at 1-800-FDA-1088. Where should I keep my medicine? This drug is given in a hospital or clinic and will not be stored at home. NOTE: This sheet is a summary. It may not cover all possible information. If you have questions about this medicine, talk to your doctor, pharmacist, or health care provider.  2018 Elsevier/Gold Standard (2015-05-17 11:20:47)  

## 2018-02-08 ENCOUNTER — Telehealth: Payer: Self-pay

## 2018-02-08 NOTE — Telephone Encounter (Signed)
Received VM from pt son, Nicole Kindred, requesting to change appt for pt from 12/30 to 12/23. Appts rescheduled same time for one week earlier (12/23). Additional query about PET scan. On VM provided new appt information, and explained that PET is ordered, but will be scheduled by Central Scheduling. Pt or son may call them at [phone].

## 2018-02-09 ENCOUNTER — Inpatient Hospital Stay: Payer: Medicare Other | Admitting: Nutrition

## 2018-02-09 NOTE — Progress Notes (Signed)
84 year old male diagnosed with anemia.  He is a patient of Dr. Irene Limbo.  Past medical history includes Barrett's esophagus, CAD, COPD, diverticulitis, GERD, hyperlipidemia, hypertension, and atrial fibrillation.  Medications include vitamin D3, Remeron, Crestor, and Zoloft.  Labs were reviewed.  Height: 5 feet 10 inches. Weight: 140.6 pounds October 15. Usual body weight: 160 pounds in January 2019. BMI: 20.17.  Patient reports that he does have dentures and he has some difficulty chewing with his lower dentures. He is tolerating a soft diet. He appears to be drinking 1 regular of Ensure daily providing 240 cal and 9 g protein. He is supposed to be receiving whey protein powder twice a day but that cannot be confirmed. Nutrition focused physical exam reflects severe muscle mass loss and body fat loss. Suspect severe malnutrition related to severe depletion of body fat and muscle mass, 4% weight loss over 2 weeks.  Nutrition diagnosis: Unintended weight loss related to difficulty chewing as evidenced by 20 pound weight loss from usual body weight. (Over 10 months)  Intervention: Educated patient to increase calories and protein and small frequent meals and snacks. Recommended patient change to Ensure Enlive twice daily between meals. Provided fact sheets.  Patient was given samples and coupons. Questions were answered and teach back method used.  Monitoring, evaluation, goals: Patient will tolerate increased calories and protein to minimize further weight loss.  Next visit: Patient will call as needed.  **Disclaimer: This note was dictated with voice recognition software. Similar sounding words can inadvertently be transcribed and this note may contain transcription errors which may not have been corrected upon publication of note.**

## 2018-02-16 ENCOUNTER — Ambulatory Visit (HOSPITAL_COMMUNITY)
Admission: RE | Admit: 2018-02-16 | Discharge: 2018-02-16 | Disposition: A | Discharge status: Home / Self Care | Payer: Medicare Other | Source: Ambulatory Visit | Attending: Hematology | Admitting: Hematology

## 2018-02-16 DIAGNOSIS — M4856XA Collapsed vertebra, not elsewhere classified, lumbar region, initial encounter for fracture: Secondary | ICD-10-CM | POA: Diagnosis not present

## 2018-02-16 DIAGNOSIS — D472 Monoclonal gammopathy: Secondary | ICD-10-CM | POA: Diagnosis present

## 2018-02-16 DIAGNOSIS — J432 Centrilobular emphysema: Secondary | ICD-10-CM | POA: Diagnosis not present

## 2018-02-16 DIAGNOSIS — J811 Chronic pulmonary edema: Secondary | ICD-10-CM | POA: Insufficient documentation

## 2018-02-16 DIAGNOSIS — J90 Pleural effusion, not elsewhere classified: Secondary | ICD-10-CM | POA: Insufficient documentation

## 2018-02-16 DIAGNOSIS — R590 Localized enlarged lymph nodes: Secondary | ICD-10-CM | POA: Insufficient documentation

## 2018-02-16 LAB — GLUCOSE, CAPILLARY: Glucose-Capillary: 73 mg/dL (ref 70–99)

## 2018-02-16 MED ORDER — FLUDEOXYGLUCOSE F - 18 (FDG) INJECTION
7.1000 | Freq: Once | INTRAVENOUS | Status: AC
  Administered 2018-02-16: 7.1 via INTRAVENOUS

## 2018-02-18 ENCOUNTER — Telehealth: Payer: Self-pay | Admitting: Hematology

## 2018-02-18 NOTE — Telephone Encounter (Signed)
Tried to reach [place] regarding voicemail about morning appt. I left a message to call if he still wanted to try and change time

## 2018-03-09 ENCOUNTER — Encounter (HOSPITAL_COMMUNITY)
Admission: EM | Disposition: A | Discharge status: Skilled Nursing Facility | Payer: Self-pay | Source: Home / Self Care | Attending: Internal Medicine

## 2018-03-09 ENCOUNTER — Other Ambulatory Visit: Payer: Self-pay

## 2018-03-09 ENCOUNTER — Inpatient Hospital Stay (HOSPITAL_COMMUNITY)
Admission: EM | Admit: 2018-03-09 | Discharge: 2018-03-12 | DRG: 956 | Disposition: A | Discharge status: Skilled Nursing Facility | Payer: Medicare Other | Attending: Internal Medicine | Admitting: Internal Medicine

## 2018-03-09 ENCOUNTER — Emergency Department (HOSPITAL_COMMUNITY): Payer: Medicare Other

## 2018-03-09 ENCOUNTER — Inpatient Hospital Stay (HOSPITAL_COMMUNITY): Payer: Medicare Other | Admitting: Certified Registered Nurse Anesthetist

## 2018-03-09 ENCOUNTER — Telehealth: Payer: Self-pay | Admitting: *Deleted

## 2018-03-09 ENCOUNTER — Inpatient Hospital Stay (HOSPITAL_COMMUNITY): Payer: Medicare Other

## 2018-03-09 ENCOUNTER — Encounter (HOSPITAL_COMMUNITY): Payer: Self-pay

## 2018-03-09 ENCOUNTER — Encounter: Payer: Self-pay | Admitting: Hematology

## 2018-03-09 DIAGNOSIS — I495 Sick sinus syndrome: Secondary | ICD-10-CM | POA: Diagnosis present

## 2018-03-09 DIAGNOSIS — N400 Enlarged prostate without lower urinary tract symptoms: Secondary | ICD-10-CM | POA: Diagnosis present

## 2018-03-09 DIAGNOSIS — S065X9A Traumatic subdural hemorrhage with loss of consciousness of unspecified duration, initial encounter: Secondary | ICD-10-CM | POA: Diagnosis present

## 2018-03-09 DIAGNOSIS — Z419 Encounter for procedure for purposes other than remedying health state, unspecified: Secondary | ICD-10-CM

## 2018-03-09 DIAGNOSIS — E785 Hyperlipidemia, unspecified: Secondary | ICD-10-CM | POA: Diagnosis present

## 2018-03-09 DIAGNOSIS — S065XAA Traumatic subdural hemorrhage with loss of consciousness status unknown, initial encounter: Secondary | ICD-10-CM | POA: Diagnosis present

## 2018-03-09 DIAGNOSIS — M810 Age-related osteoporosis without current pathological fracture: Secondary | ICD-10-CM | POA: Diagnosis present

## 2018-03-09 DIAGNOSIS — Z950 Presence of cardiac pacemaker: Secondary | ICD-10-CM | POA: Diagnosis not present

## 2018-03-09 DIAGNOSIS — J418 Mixed simple and mucopurulent chronic bronchitis: Secondary | ICD-10-CM | POA: Diagnosis not present

## 2018-03-09 DIAGNOSIS — S72012A Unspecified intracapsular fracture of left femur, initial encounter for closed fracture: Secondary | ICD-10-CM | POA: Diagnosis present

## 2018-03-09 DIAGNOSIS — Y92129 Unspecified place in nursing home as the place of occurrence of the external cause: Secondary | ICD-10-CM | POA: Diagnosis not present

## 2018-03-09 DIAGNOSIS — F329 Major depressive disorder, single episode, unspecified: Secondary | ICD-10-CM | POA: Diagnosis present

## 2018-03-09 DIAGNOSIS — Z87891 Personal history of nicotine dependence: Secondary | ICD-10-CM | POA: Diagnosis not present

## 2018-03-09 DIAGNOSIS — K219 Gastro-esophageal reflux disease without esophagitis: Secondary | ICD-10-CM | POA: Diagnosis present

## 2018-03-09 DIAGNOSIS — J449 Chronic obstructive pulmonary disease, unspecified: Secondary | ICD-10-CM | POA: Diagnosis present

## 2018-03-09 DIAGNOSIS — Z79899 Other long term (current) drug therapy: Secondary | ICD-10-CM | POA: Diagnosis not present

## 2018-03-09 DIAGNOSIS — R630 Anorexia: Secondary | ICD-10-CM | POA: Diagnosis present

## 2018-03-09 DIAGNOSIS — Z888 Allergy status to other drugs, medicaments and biological substances status: Secondary | ICD-10-CM

## 2018-03-09 DIAGNOSIS — I110 Hypertensive heart disease with heart failure: Secondary | ICD-10-CM | POA: Diagnosis present

## 2018-03-09 DIAGNOSIS — I509 Heart failure, unspecified: Secondary | ICD-10-CM | POA: Diagnosis present

## 2018-03-09 DIAGNOSIS — W010XXA Fall on same level from slipping, tripping and stumbling without subsequent striking against object, initial encounter: Secondary | ICD-10-CM | POA: Diagnosis present

## 2018-03-09 DIAGNOSIS — F0390 Unspecified dementia without behavioral disturbance: Secondary | ICD-10-CM | POA: Diagnosis present

## 2018-03-09 DIAGNOSIS — Z955 Presence of coronary angioplasty implant and graft: Secondary | ICD-10-CM | POA: Diagnosis not present

## 2018-03-09 DIAGNOSIS — Z7901 Long term (current) use of anticoagulants: Secondary | ICD-10-CM | POA: Diagnosis not present

## 2018-03-09 DIAGNOSIS — S72002A Fracture of unspecified part of neck of left femur, initial encounter for closed fracture: Secondary | ICD-10-CM

## 2018-03-09 DIAGNOSIS — I480 Paroxysmal atrial fibrillation: Secondary | ICD-10-CM | POA: Diagnosis present

## 2018-03-09 DIAGNOSIS — Z881 Allergy status to other antibiotic agents status: Secondary | ICD-10-CM

## 2018-03-09 DIAGNOSIS — Z7951 Long term (current) use of inhaled steroids: Secondary | ICD-10-CM | POA: Diagnosis not present

## 2018-03-09 DIAGNOSIS — S72009A Fracture of unspecified part of neck of unspecified femur, initial encounter for closed fracture: Secondary | ICD-10-CM | POA: Diagnosis present

## 2018-03-09 DIAGNOSIS — I2510 Atherosclerotic heart disease of native coronary artery without angina pectoris: Secondary | ICD-10-CM | POA: Diagnosis present

## 2018-03-09 DIAGNOSIS — I10 Essential (primary) hypertension: Secondary | ICD-10-CM | POA: Diagnosis present

## 2018-03-09 DIAGNOSIS — W19XXXA Unspecified fall, initial encounter: Secondary | ICD-10-CM | POA: Diagnosis present

## 2018-03-09 DIAGNOSIS — I4891 Unspecified atrial fibrillation: Secondary | ICD-10-CM | POA: Diagnosis present

## 2018-03-09 DIAGNOSIS — Y92009 Unspecified place in unspecified non-institutional (private) residence as the place of occurrence of the external cause: Secondary | ICD-10-CM

## 2018-03-09 DIAGNOSIS — S32592A Other specified fracture of left pubis, initial encounter for closed fracture: Secondary | ICD-10-CM | POA: Diagnosis present

## 2018-03-09 HISTORY — PX: HIP PINNING,CANNULATED: SHX1758

## 2018-03-09 LAB — BASIC METABOLIC PANEL
ANION GAP: 6 (ref 5–15)
BUN: 14 mg/dL (ref 8–23)
CO2: 24 mmol/L (ref 22–32)
Calcium: 8.1 mg/dL — ABNORMAL LOW (ref 8.9–10.3)
Chloride: 102 mmol/L (ref 98–111)
Creatinine, Ser: 0.62 mg/dL (ref 0.61–1.24)
GFR calc Af Amer: >60 mL/min (ref >60)
GLUCOSE: 100 mg/dL — AB (ref 70–99)
POTASSIUM: 4.1 mmol/L (ref 3.5–5.1)
Sodium: 132 mmol/L — ABNORMAL LOW (ref 135–145)

## 2018-03-09 LAB — CBC
HEMATOCRIT: 30.9 % — AB (ref 39.0–52.0)
HEMOGLOBIN: 9.3 g/dL — AB (ref 13.0–17.0)
MCH: 30.6 pg (ref 26.0–34.0)
MCHC: 30.1 g/dL (ref 30.0–36.0)
MCV: 101.6 fL — AB (ref 80.0–100.0)
NRBC: 0 % (ref 0.0–0.2)
PLATELETS: 225 10*3/uL (ref 150–400)
RBC: 3.04 MIL/uL — AB (ref 4.22–5.81)
RDW: 21.8 % — ABNORMAL HIGH (ref 11.5–15.5)
WBC: 8.7 10*3/uL (ref 4.0–10.5)

## 2018-03-09 SURGERY — FIXATION, FEMUR, NECK, PERCUTANEOUS, USING SCREW
Anesthesia: General

## 2018-03-09 MED ORDER — TAMSULOSIN HCL 0.4 MG PO CAPS
0.4000 mg | ORAL_CAPSULE | Freq: Every day | ORAL | Status: DC
  Administered 2018-03-10 – 2018-03-12 (×3): 0.4 mg via ORAL
  Filled 2018-03-09 (×3): qty 1

## 2018-03-09 MED ORDER — CHLORHEXIDINE GLUCONATE 4 % EX LIQD: 60.0000 mL | Freq: Once | CUTANEOUS | Status: DC

## 2018-03-09 MED ORDER — SODIUM CHLORIDE 0.9% FLUSH: 3.0000 mL | INTRAVENOUS | Status: DC | PRN

## 2018-03-09 MED ORDER — ROSUVASTATIN CALCIUM 10 MG PO TABS
10.0000 mg | ORAL_TABLET | Freq: Every day | ORAL | Status: DC
  Administered 2018-03-09 – 2018-03-12 (×4): 10 mg via ORAL
  Filled 2018-03-09 (×4): qty 1

## 2018-03-09 MED ORDER — EPHEDRINE SULFATE-NACL 50-0.9 MG/10ML-% IV SOSY
PREFILLED_SYRINGE | INTRAVENOUS | Status: DC | PRN
  Administered 2018-03-09: 5 mg via INTRAVENOUS

## 2018-03-09 MED ORDER — ENSURE ENLIVE PO LIQD
1.0000 | Freq: Two times a day (BID) | ORAL | Status: DC
  Administered 2018-03-10 – 2018-03-12 (×5): 237 mL via ORAL

## 2018-03-09 MED ORDER — FERROUS SULFATE 325 (65 FE) MG PO TABS
325.0000 mg | ORAL_TABLET | Freq: Two times a day (BID) | ORAL | Status: DC
  Administered 2018-03-10 – 2018-03-12 (×6): 325 mg via ORAL
  Filled 2018-03-09 (×6): qty 1

## 2018-03-09 MED ORDER — DEXAMETHASONE SODIUM PHOSPHATE 10 MG/ML IJ SOLN
INTRAMUSCULAR | Status: DC | PRN
  Administered 2018-03-09: 8 mg via INTRAVENOUS

## 2018-03-09 MED ORDER — LACTATED RINGERS IV SOLN
INTRAVENOUS | Status: DC | PRN
  Administered 2018-03-09: 14:00:00 via INTRAVENOUS

## 2018-03-09 MED ORDER — DIVALPROEX SODIUM 250 MG PO DR TAB
250.0000 mg | DELAYED_RELEASE_TABLET | Freq: Two times a day (BID) | ORAL | Status: DC
  Administered 2018-03-09 – 2018-03-12 (×6): 250 mg via ORAL
  Filled 2018-03-09 (×6): qty 1

## 2018-03-09 MED ORDER — NITROGLYCERIN 0.4 MG SL SUBL: 0.4000 mg | SUBLINGUAL_TABLET | SUBLINGUAL | Status: DC | PRN

## 2018-03-09 MED ORDER — SODIUM CHLORIDE 0.9% FLUSH
3.0000 mL | Freq: Two times a day (BID) | INTRAVENOUS | Status: DC
  Administered 2018-03-10 – 2018-03-12 (×5): 3 mL via INTRAVENOUS

## 2018-03-09 MED ORDER — SOTALOL HCL 80 MG PO TABS
80.0000 mg | ORAL_TABLET | Freq: Two times a day (BID) | ORAL | Status: DC
  Administered 2018-03-09 – 2018-03-12 (×5): 80 mg via ORAL
  Filled 2018-03-09 (×7): qty 1

## 2018-03-09 MED ORDER — POLYETHYLENE GLYCOL 3350 17 G PO PACK
17.0000 g | PACK | Freq: Every day | ORAL | Status: DC | PRN
  Administered 2018-03-12: 17 g via ORAL
  Filled 2018-03-09: qty 1

## 2018-03-09 MED ORDER — PHENYLEPHRINE 40 MCG/ML (10ML) SYRINGE FOR IV PUSH (FOR BLOOD PRESSURE SUPPORT)
PREFILLED_SYRINGE | INTRAVENOUS | Status: AC
  Filled 2018-03-09: qty 10

## 2018-03-09 MED ORDER — WHEY PROTEIN PO POWD: 1.0000 | Freq: Two times a day (BID) | ORAL | Status: DC

## 2018-03-09 MED ORDER — FENTANYL CITRATE (PF) 100 MCG/2ML IJ SOLN
INTRAMUSCULAR | Status: DC | PRN
  Administered 2018-03-09 (×2): 50 ug via INTRAVENOUS

## 2018-03-09 MED ORDER — CLINDAMYCIN PHOSPHATE 900 MG/50ML IV SOLN
INTRAVENOUS | Status: AC
  Filled 2018-03-09: qty 50

## 2018-03-09 MED ORDER — MORPHINE SULFATE (PF) 2 MG/ML IV SOLN
2.0000 mg | INTRAVENOUS | Status: DC | PRN
  Administered 2018-03-09: 2 mg via INTRAVENOUS
  Filled 2018-03-09: qty 1

## 2018-03-09 MED ORDER — ONDANSETRON HCL 4 MG PO TABS: 4.0000 mg | ORAL_TABLET | Freq: Four times a day (QID) | ORAL | Status: DC | PRN

## 2018-03-09 MED ORDER — SUGAMMADEX SODIUM 200 MG/2ML IV SOLN
INTRAVENOUS | Status: DC | PRN
  Administered 2018-03-09: 200 mg via INTRAVENOUS

## 2018-03-09 MED ORDER — APIXABAN 2.5 MG PO TABS: 2.5000 mg | ORAL_TABLET | Freq: Two times a day (BID) | ORAL | Status: DC

## 2018-03-09 MED ORDER — DEXAMETHASONE SODIUM PHOSPHATE 10 MG/ML IJ SOLN
INTRAMUSCULAR | Status: AC
  Filled 2018-03-09: qty 1

## 2018-03-09 MED ORDER — LACTATED RINGERS IV SOLN: INTRAVENOUS | Status: DC

## 2018-03-09 MED ORDER — ONDANSETRON HCL 4 MG/2ML IJ SOLN
INTRAMUSCULAR | Status: DC | PRN
  Administered 2018-03-09: 4 mg via INTRAVENOUS

## 2018-03-09 MED ORDER — LACTULOSE 10 GM/15ML PO SOLN
20.0000 g | Freq: Every day | ORAL | Status: DC
  Administered 2018-03-10 – 2018-03-12 (×3): 20 g via ORAL
  Filled 2018-03-09 (×3): qty 30

## 2018-03-09 MED ORDER — TRAZODONE HCL 50 MG PO TABS
50.0000 mg | ORAL_TABLET | Freq: Every evening | ORAL | Status: DC | PRN
  Administered 2018-03-09: 50 mg via ORAL
  Filled 2018-03-09: qty 1

## 2018-03-09 MED ORDER — PHENYLEPHRINE 40 MCG/ML (10ML) SYRINGE FOR IV PUSH (FOR BLOOD PRESSURE SUPPORT)
PREFILLED_SYRINGE | INTRAVENOUS | Status: DC | PRN
  Administered 2018-03-09: 40 ug via INTRAVENOUS
  Administered 2018-03-09 (×2): 80 ug via INTRAVENOUS
  Administered 2018-03-09: 120 ug via INTRAVENOUS
  Administered 2018-03-09: 80 ug via INTRAVENOUS

## 2018-03-09 MED ORDER — ACETAMINOPHEN 500 MG PO TABS: 1000.0000 mg | ORAL_TABLET | Freq: Two times a day (BID) | ORAL | Status: DC

## 2018-03-09 MED ORDER — MINERAL OIL PO OIL: 0.0500 mL | TOPICAL_OIL | ORAL | Status: DC

## 2018-03-09 MED ORDER — ACETAMINOPHEN 500 MG PO TABS
500.0000 mg | ORAL_TABLET | Freq: Four times a day (QID) | ORAL | Status: DC | PRN
  Filled 2018-03-09: qty 1

## 2018-03-09 MED ORDER — ACETAMINOPHEN 325 MG PO TABS
650.0000 mg | ORAL_TABLET | Freq: Once | ORAL | Status: AC
  Administered 2018-03-09: 650 mg via ORAL
  Filled 2018-03-09: qty 2

## 2018-03-09 MED ORDER — VITAMIN D3 50 MCG (2000 UT) PO CAPS: 4000.0000 [IU] | ORAL_CAPSULE | Freq: Every day | ORAL | Status: DC

## 2018-03-09 MED ORDER — HYDROCODONE-ACETAMINOPHEN 5-325 MG PO TABS
1.0000 | ORAL_TABLET | ORAL | Status: DC | PRN
  Administered 2018-03-09 – 2018-03-11 (×4): 1 via ORAL
  Filled 2018-03-09 (×4): qty 1

## 2018-03-09 MED ORDER — HYDROCODONE-ACETAMINOPHEN 5-325 MG PO TABS: 1.0000 | ORAL_TABLET | Freq: Four times a day (QID) | ORAL | 0 refills | Status: AC | PRN

## 2018-03-09 MED ORDER — ROCURONIUM BROMIDE 50 MG/5ML IV SOSY
PREFILLED_SYRINGE | INTRAVENOUS | Status: AC
  Filled 2018-03-09: qty 5

## 2018-03-09 MED ORDER — LORAZEPAM 2 MG/ML IJ SOLN: 0.5000 mg | Freq: Four times a day (QID) | INTRAMUSCULAR | Status: DC | PRN

## 2018-03-09 MED ORDER — CEFAZOLIN SODIUM-DEXTROSE 2-3 GM-%(50ML) IV SOLR
INTRAVENOUS | Status: DC | PRN
  Administered 2018-03-09: 2 g via INTRAVENOUS

## 2018-03-09 MED ORDER — FENTANYL CITRATE (PF) 250 MCG/5ML IJ SOLN
INTRAMUSCULAR | Status: AC
  Filled 2018-03-09: qty 5

## 2018-03-09 MED ORDER — FENTANYL CITRATE (PF) 100 MCG/2ML IJ SOLN
INTRAMUSCULAR | Status: AC
  Filled 2018-03-09: qty 2

## 2018-03-09 MED ORDER — ROCURONIUM BROMIDE 50 MG/5ML IV SOSY
PREFILLED_SYRINGE | INTRAVENOUS | Status: DC | PRN
  Administered 2018-03-09: 40 mg via INTRAVENOUS

## 2018-03-09 MED ORDER — EPHEDRINE 5 MG/ML INJ
INTRAVENOUS | Status: AC
  Filled 2018-03-09: qty 10

## 2018-03-09 MED ORDER — HEPARIN SODIUM (PORCINE) 5000 UNIT/ML IJ SOLN: 5000.0000 [IU] | Freq: Three times a day (TID) | INTRAMUSCULAR | Status: DC

## 2018-03-09 MED ORDER — ALBUTEROL SULFATE 0.63 MG/3ML IN NEBU: 1.0000 | INHALATION_SOLUTION | Freq: Three times a day (TID) | RESPIRATORY_TRACT | Status: DC | PRN

## 2018-03-09 MED ORDER — ONDANSETRON HCL 4 MG/2ML IJ SOLN
INTRAMUSCULAR | Status: AC
  Filled 2018-03-09: qty 2

## 2018-03-09 MED ORDER — SUGAMMADEX SODIUM 200 MG/2ML IV SOLN
INTRAVENOUS | Status: AC
  Filled 2018-03-09: qty 2

## 2018-03-09 MED ORDER — CLINDAMYCIN PHOSPHATE 900 MG/50ML IV SOLN
900.0000 mg | INTRAVENOUS | Status: AC
  Administered 2018-03-09: 900 mg via INTRAVENOUS

## 2018-03-09 MED ORDER — LIDOCAINE 2% (20 MG/ML) 5 ML SYRINGE
INTRAMUSCULAR | Status: DC | PRN
  Administered 2018-03-09: 100 mg via INTRAVENOUS

## 2018-03-09 MED ORDER — FENTANYL CITRATE (PF) 100 MCG/2ML IJ SOLN
25.0000 ug | INTRAMUSCULAR | Status: DC | PRN
  Administered 2018-03-09: 50 ug via INTRAVENOUS

## 2018-03-09 MED ORDER — LACTATED RINGERS IV SOLN
INTRAVENOUS | Status: DC
  Administered 2018-03-09: 13:00:00 via INTRAVENOUS

## 2018-03-09 MED ORDER — OLOPATADINE HCL 0.1 % OP SOLN
1.0000 [drp] | Freq: Two times a day (BID) | OPHTHALMIC | Status: DC
  Administered 2018-03-09 – 2018-03-12 (×6): 1 [drp] via OPHTHALMIC
  Filled 2018-03-09: qty 5

## 2018-03-09 MED ORDER — ACETAMINOPHEN 325 MG PO TABS: 650.0000 mg | ORAL_TABLET | Freq: Four times a day (QID) | ORAL | Status: DC | PRN

## 2018-03-09 MED ORDER — ALBUTEROL SULFATE (2.5 MG/3ML) 0.083% IN NEBU: 2.5000 mg | INHALATION_SOLUTION | RESPIRATORY_TRACT | Status: DC | PRN

## 2018-03-09 MED ORDER — MINERIN CREME EX CREA: 1.0000 "application " | TOPICAL_CREAM | Freq: Every day | CUTANEOUS | Status: DC

## 2018-03-09 MED ORDER — ONDANSETRON HCL 4 MG/2ML IJ SOLN: 4.0000 mg | Freq: Four times a day (QID) | INTRAMUSCULAR | Status: DC | PRN

## 2018-03-09 MED ORDER — MIRTAZAPINE 15 MG PO TABS
7.5000 mg | ORAL_TABLET | Freq: Every day | ORAL | Status: DC
  Administered 2018-03-09 – 2018-03-11 (×3): 7.5 mg via ORAL
  Filled 2018-03-09 (×4): qty 0.5

## 2018-03-09 MED ORDER — FINASTERIDE 5 MG PO TABS
5.0000 mg | ORAL_TABLET | Freq: Every day | ORAL | Status: DC
  Administered 2018-03-10 – 2018-03-12 (×3): 5 mg via ORAL
  Filled 2018-03-09 (×3): qty 1

## 2018-03-09 MED ORDER — LIDOCAINE 2% (20 MG/ML) 5 ML SYRINGE
INTRAMUSCULAR | Status: AC
  Filled 2018-03-09: qty 5

## 2018-03-09 MED ORDER — EMPTY CONTAINERS FLEXIBLE MISC
900.0000 mg | Freq: Once | Status: AC
  Administered 2018-03-09: 900 mg via INTRAVENOUS
  Filled 2018-03-09: qty 90

## 2018-03-09 MED ORDER — ACETAMINOPHEN 650 MG RE SUPP: 650.0000 mg | Freq: Four times a day (QID) | RECTAL | Status: DC | PRN

## 2018-03-09 MED ORDER — SODIUM CHLORIDE 0.9 % IV SOLN
INTRAVENOUS | Status: DC | PRN
  Administered 2018-03-09: 40 ug/min via INTRAVENOUS

## 2018-03-09 MED ORDER — PROPOFOL 10 MG/ML IV BOLUS
INTRAVENOUS | Status: DC | PRN
  Administered 2018-03-09: 60 mg via INTRAVENOUS

## 2018-03-09 MED ORDER — ALBUMIN HUMAN 5 % IV SOLN
INTRAVENOUS | Status: DC | PRN
  Administered 2018-03-09: 15:00:00 via INTRAVENOUS

## 2018-03-09 MED ORDER — POVIDONE-IODINE 10 % EX SWAB: 2.0000 "application " | Freq: Once | CUTANEOUS | Status: DC

## 2018-03-09 MED ORDER — SODIUM CHLORIDE 0.9 % IV SOLN
250.0000 mL | INTRAVENOUS | Status: DC | PRN
  Administered 2018-03-09: 250 mL via INTRAVENOUS

## 2018-03-09 MED ORDER — PROPOFOL 10 MG/ML IV BOLUS
INTRAVENOUS | Status: AC
  Filled 2018-03-09: qty 20

## 2018-03-09 SURGICAL SUPPLY — 35 items
BIT DRILL 4.9 CANNULATED (BIT) ×1
BIT DRILL CANN QC 4.9 LRG (BIT) IMPLANT
BNDG COHESIVE 4X5 TAN STRL (GAUZE/BANDAGES/DRESSINGS) ×3 IMPLANT
BNDG GAUZE ELAST 4 BULKY (GAUZE/BANDAGES/DRESSINGS) ×3 IMPLANT
CLOSURE STERI-STRIP 1/4X4 (GAUZE/BANDAGES/DRESSINGS) ×2 IMPLANT
COVER PERINEAL POST (MISCELLANEOUS) ×3 IMPLANT
COVER SURGICAL LIGHT HANDLE (MISCELLANEOUS) ×3 IMPLANT
COVER WAND RF STERILE (DRAPES) ×3 IMPLANT
DRAPE STERI IOBAN 125X83 (DRAPES) ×3 IMPLANT
DRILL BIT CANNULATED 4.9 (BIT) ×3
DRSG MEPILEX BORDER 4X4 (GAUZE/BANDAGES/DRESSINGS) ×3 IMPLANT
DRSG MEPILEX BORDER 4X8 (GAUZE/BANDAGES/DRESSINGS) ×2 IMPLANT
DURAPREP 26ML APPLICATOR (WOUND CARE) ×3 IMPLANT
ELECT REM PT RETURN 9FT ADLT (ELECTROSURGICAL) ×3
ELECTRODE REM PT RTRN 9FT ADLT (ELECTROSURGICAL) ×1 IMPLANT
GLOVE BIO SURGEON STRL SZ7.5 (GLOVE) ×6 IMPLANT
GLOVE BIOGEL PI IND STRL 8 (GLOVE) ×2 IMPLANT
GLOVE BIOGEL PI INDICATOR 8 (GLOVE) ×4
GOWN STRL REUS W/ TWL LRG LVL3 (GOWN DISPOSABLE) ×3 IMPLANT
GOWN STRL REUS W/TWL LRG LVL3 (GOWN DISPOSABLE) ×9
GUIDEWIRE THRD ASNIS 3.2X300 (WIRE) ×6 IMPLANT
KIT BASIN OR (CUSTOM PROCEDURE TRAY) ×3 IMPLANT
KIT TURNOVER KIT B (KITS) ×3 IMPLANT
MANIFOLD NEPTUNE II (INSTRUMENTS) ×3 IMPLANT
NS IRRIG 1000ML POUR BTL (IV SOLUTION) ×3 IMPLANT
PACK GENERAL/GYN (CUSTOM PROCEDURE TRAY) ×3 IMPLANT
PAD ARMBOARD 7.5X6 YLW CONV (MISCELLANEOUS) ×6 IMPLANT
SCREW ASNIS 100MM (Screw) ×4 IMPLANT
SCREW ASNIS 95MM (Screw) ×2 IMPLANT
SUT MON AB 2-0 CT1 36 (SUTURE) ×3 IMPLANT
SUT VIC AB 0 CT1 27 (SUTURE)
SUT VIC AB 0 CT1 27XBRD ANBCTR (SUTURE) IMPLANT
TOWEL OR 17X24 6PK STRL BLUE (TOWEL DISPOSABLE) ×3 IMPLANT
TOWEL OR 17X26 10 PK STRL BLUE (TOWEL DISPOSABLE) ×3 IMPLANT
WATER STERILE IRR 1000ML POUR (IV SOLUTION) ×3 IMPLANT

## 2018-03-09 NOTE — Anesthesia Postprocedure Evaluation (Signed)
Anesthesia Post Note  Patient: Steve Norris  Procedure(s) Performed: CANNULATED HIP PINNING (N/A )     Patient location during evaluation: PACU Anesthesia Type: General Level of consciousness: awake Pain management: pain level controlled Vital Signs Assessment: post-procedure vital signs reviewed and stable Respiratory status: spontaneous breathing Cardiovascular status: stable Postop Assessment: no apparent nausea or vomiting Anesthetic complications: yes    Last Vitals:  Vitals:   03/09/18 1545 03/09/18 1600  BP: (!) 116/54 (!) 121/53  Pulse: 81 68  Resp: 14 10  Temp:    SpO2: 91% 96%    Last Pain:  Vitals:   03/09/18 1615  TempSrc:   PainSc: 0-No pain                 Tayari Yankee

## 2018-03-09 NOTE — Transfer of Care (Signed)
Immediate Anesthesia Transfer of Care Note  Patient: Steve Norris  Procedure(s) Performed: CANNULATED HIP PINNING (N/A )  Patient Location: PACU  Anesthesia Type:General  Level of Consciousness: awake and patient cooperative  Airway & Oxygen Therapy: Patient Spontanous Breathing and Patient connected to face mask oxygen  Post-op Assessment: Report given to RN and Post -op Vital signs reviewed and stable  Post vital signs: Reviewed and stable  Last Vitals:  Vitals Value Taken Time  BP 121/77 03/09/2018  3:15 PM  Temp    Pulse 81 03/09/2018  3:18 PM  Resp 12 03/09/2018  3:18 PM  SpO2 100 % 03/09/2018  3:18 PM  Vitals shown include unvalidated device data.  Last Pain:  Vitals:   03/09/18 1154  TempSrc:   PainSc: 0-No pain         Complications: No apparent anesthesia complications

## 2018-03-09 NOTE — Telephone Encounter (Signed)
Received following message in MyChart 03/09/18:  Hello Dr. Irene Limbo, I have some unfortunate news to send along that [name] took quite a fall last night and may end up having hip surgery as early as today. Since we are hoping that [name] can still gain some good quality of life after this setback, we are wondering if you could work with the surgeon as far as placing orders for the bone marrow biopsy if they do go forward with surgery. We had worried about putting Bill through that biopsy in his frail condition so this would be an opportunity to piggy-back on this unfortunate turn of events. Obviously we would defer to your opinion on whether this is a good idea and can be worked out on short notice. [name] is at [facility]. Thanks very much for your consideration, Nicole Kindred and Wiatt Mahabir (son/POA and daughter-in-law)  No contact number in chart for Nicole Kindred, called Huntington Leverich (other son) at [phone] (M). Per [name], patient is having surgery today to pin hip. Advised family that Dr. Irene Limbo out of office at this time and will see this message on his return. Son verbalized understanding.

## 2018-03-09 NOTE — Progress Notes (Signed)
Waiting on MD to speak to family/ patient regarding consent

## 2018-03-09 NOTE — Care Management (Signed)
This is a no charge note  Pending admission per Dr. Betsey Holiday.  84 year old male with past medical history of atrial fibrillation on Eliquis, hypertension, hyperlipidemia, COPD, asthma, GERD, depression, CAD, dementia, anemia, SDH, fall, who presents with an unwitnessed fall.  CT head showed subdural hematoma with mild mass-effect.  X-ray of left hip/pelvis showed left femoral neck fracture.  Neurosurgeon, Dr. Arnoldo Morale was consulted, will see patient in the morning.  ED physician is calling orthopedic surgeon, waiting for calling back. One dose of Andexxa is being given now. Pt is admitted to SDU as inpt.    Ivor Costa, MD  [facility] Pager [phone]  If 7PM-7AM, please contact night-coverage [email or web address] Password [email or web address] 03/09/2018, 6:14 AM

## 2018-03-09 NOTE — Progress Notes (Signed)
Dr. Smith  notified that patient has a pacemaker.

## 2018-03-09 NOTE — ED Notes (Signed)
Patient transported to X-ray 

## 2018-03-09 NOTE — ED Triage Notes (Signed)
Pt BIB [facility] for eval of unwitnessed fall. Pt was found down on floor in room bleeding. Pt does remember fall, c/o L sided head pain, L sided hip pain. Pt able to bear weight on scene for EMS. Hemostatic wounds to L head. Confused at baseline, EMS reports facility reported he was at baseline

## 2018-03-09 NOTE — Anesthesia Procedure Notes (Signed)
Procedure Name: Intubation Date/Time: 03/09/2018 2:26 PM Performed by: Orlie Dakin, CRNA Pre-anesthesia Checklist: Patient identified, Emergency Drugs available, Suction available and Patient being monitored Patient Re-evaluated:Patient Re-evaluated prior to induction Oxygen Delivery Method: Circle system utilized Preoxygenation: Pre-oxygenation with 100% oxygen Induction Type: IV induction Ventilation: Mask ventilation without difficulty and Oral airway inserted - appropriate to patient size Laryngoscope Size: Sabra Heck and 3 Grade View: Grade I Tube type: Oral Tube size: 7.5 mm Number of attempts: 1 Airway Equipment and Method: Stylet Placement Confirmation: ETT inserted through vocal cords under direct vision,  positive ETCO2 and breath sounds checked- equal and bilateral Secured at: 23 cm Tube secured with: Tape Dental Injury: Teeth and Oropharynx as per pre-operative assessment

## 2018-03-09 NOTE — Consult Note (Signed)
Reason for Consult:Left hip fx Referring Physician: C Thayden Norris is an 84 y.o. male.  HPI: Steve Norris fell [facility] at the [facility]. He says he tripped over something and landed on his left side. He was apparently able to ambulate some after the fall. He was brought to the ED where x-rays showed a left hip fx and a SDH. His anticoagulation was reversed and orthopedic surgery was consulted.  Past Medical History:  Diagnosis Date  . Arthritis   . Asthma   . Barrett's esophagus 03-19-2009   EGD  . CAD (coronary artery disease)    a. s/p stent placement in 2006 to the LAD and RCA  . COPD (chronic obstructive pulmonary disease) ([place])   . Dementia ([place])   . Diverticulosis 03-19-2009   Colonoscopy   . Erosive esophagitis 03-19-2009   EGD  . GERD (gastroesophageal reflux disease)   . Hiatal hernia 03-19-2009   EGD  . Hyperlipidemia   . Hypertension   . Internal hemorrhoids 03-19-2009   Colonoscopy  . Paroxysmal atrial fibrillation (H[facility])   . Sinus node dysfunction (H[facility])    a. s/p PPM placement in 2011  . Subdural hematoma ([place]) 05/08/2017   small, left/notes 05/08/2017    Past Surgical History:  Procedure Laterality Date  . CARDIAC CATHETERIZATION  02/12/2009   mild ostial left main disease,patent LAD & RCA stents   . CARDIOVERSION N/A 11/06/2014   Procedure: CARDIOVERSION;  Surgeon: [name], MD;  Location[facility] ENDOSCOPY;  Service: Cardiovascular;  Laterality: N/A;  . CARDIOVERSION N/A 08/02/2015   Procedure: CARDIOVERSION;  Surgeon: Sueanne Margarita, MD;  Location: [facility] ENDOSCOPY;  Service: Cardiovascular;  Laterality: N/A;  . CATARACT EXTRACTION    . COLONOSCOPY    . CORONARY ANGIOPLASTY WITH STENT PLACEMENT  2006   Right Coronary Artery Cypher stents placed 2006  . HEMORRHOID SURGERY    . INSERT / REPLACE / REMOVE PACEMAKER  07/25/2009   [employer]  . LEFT HEART CATHETERIZATION WITH CORONARY ANGIOGRAM N/A 07/01/2013   Procedure: LEFT HEART  CATHETERIZATION WITH CORONARY ANGIOGRAM;  Surgeon: Sanda Klein, MD;  Location: [facility] CATH LAB;  Service: Cardiovascular;  Laterality: N/A;  . NM MYOCAR PERF WALL MOTION  08/14/2008   no significant ischemia EF 64%  . TEE WITHOUT CARDIOVERSION N/A 08/02/2015   Procedure: TRANSESOPHAGEAL ECHOCARDIOGRAM (TEE);  Surgeon: Sueanne Margarita, MD;  Location: [facility] ENDOSCOPY;  Service: Cardiovascular;  Laterality: N/A;  . TONSILLECTOMY    . US ECHOCARDIOGRAPHY  07/12/2009   borderline LA enlargement,mild mitral annular ca+, AOV mildly sclerotic,trace AI.  Marland Kitchen VASECTOMY      Family History  Problem Relation Age of Onset  . Heart disease Mother   . Heart disease Father   . Colon cancer Neg Hx     Social History:  reports that he has quit smoking. He has never used smokeless tobacco. He reports that he does not drink alcohol or use drugs.  Allergies:  Allergies  Allergen Reactions  . Amiodarone Nausea And Vomiting  . Bactrim [Sulfamethoxazole-Trimethoprim] Other (See Comments)    Unknown reaction. Listed on MAR  . Penicillins Hives, Swelling and Rash    Medications: I have reviewed the patient's current medications.  Results for orders placed or performed during the hospital encounter of 03/09/18 (from the past 48 hour(s))  CBC     Status: Abnormal   Collection Time: 03/09/18  4:47 AM  Result Value Ref Range   WBC 8.7 4.0 - 10.5  K/uL   RBC 3.04 (L) 4.22 - 5.81 MIL/uL   Hemoglobin 9.3 (L) 13.0 - 17.0 g/dL   HCT 30.9 (L) 39.0 - 52.0 %   MCV 101.6 (H) 80.0 - 100.0 fL   MCH 30.6 26.0 - 34.0 pg   MCHC 30.1 30.0 - 36.0 g/dL   RDW 21.8 (H) 11.5 - 15.5 %   Platelets 225 150 - 400 K/uL   nRBC 0.0 0.0 - 0.2 %    Comment: Performed at [facility] [address], [place], [place] [address]  Basic metabolic panel     Status: Abnormal   Collection Time: 03/09/18  4:47 AM  Result Value Ref Range   Sodium 132 (L) 135 - 145 mmol/L   Potassium 4.1 3.5 - 5.1 mmol/L   Chloride 102 98 - 111 mmol/L    CO2 24 22 - 32 mmol/L   Glucose, Bld 100 (H) 70 - 99 mg/dL   BUN 14 8 - 23 mg/dL   Creatinine, Ser 0.62 0.61 - 1.24 mg/dL   Calcium 8.1 (L) 8.9 - 10.3 mg/dL   GFR calc non Af Amer >60 >60 mL/min   GFR calc Af Amer >60 >60 mL/min    Comment: (NOTE) The eGFR has been calculated using the CKD EPI equation. This calculation has not been validated in all clinical situations. eGFR's persistently <60 mL/min signify possible Chronic Kidney Disease.    Anion gap 6 5 - 15    Comment: Performed at [facility] [address] [place], [place] [address]    Ct Head Wo Contrast  Result Date: 03/09/2018 CLINICAL DATA:  Status post unwitnessed fall. Patient found down. Acute onset of left-sided head pain. Concern for cervical spine injury. Initial encounter. EXAM: CT HEAD WITHOUT CONTRAST CT CERVICAL SPINE WITHOUT CONTRAST TECHNIQUE: Multidetector CT imaging of the head and cervical spine was performed following the standard protocol without intravenous contrast. Multiplanar CT image reconstructions of the cervical spine were also generated. COMPARISON:  CT of the head performed 05/09/2017, and CT of the cervical spine performed 12/26/2016 FINDINGS: CT HEAD FINDINGS Brain: There is acute subdural hematoma tracking along the posterior left parietal lobe, measuring up to 1.1 cm in thickness, with mild associated mass effect. There is approximately 2 mm of rightward midline shift at this time. No evidence of acute infarction or hydrocephalus. Mild periventricular white matter change likely reflects small vessel ischemic microangiopathy. Mild cerebellar atrophy is noted. The brainstem and fourth ventricle are within normal limits. The third and lateral ventricles, and basal ganglia are unremarkable in appearance. Vascular: No hyperdense vessel or unexpected calcification. Skull: There is no evidence of fracture; visualized osseous structures are unremarkable in appearance. Sinuses/Orbits: The orbits are  within normal limits. There is complete opacification of the mastoid air cells bilaterally. The paranasal sinuses are well-aerated. Other: Soft tissue swelling is noted lateral to the left orbit and overlying the left frontal calvarium. CT CERVICAL SPINE FINDINGS Alignment: Normal. Skull base and vertebrae: No acute fracture. No primary bone lesion or focal pathologic process. Soft tissues and spinal canal: No prevertebral fluid or swelling. No visible canal hematoma. Disc levels: There is mild intervertebral disc space narrowing at C5-C6, with small anterior and posterior disc osteophyte complexes. A posterior disc osteophyte complex is noted at C6-C7, with narrowing of the spinal canal to 8 mm at this level. Facet disease is noted along the cervical spine. Degenerative change is noted about the dens. Upper chest: Emphysema is noted at the  lung apices. The visualized portions of the thyroid gland are unremarkable. Mild calcification is noted at the carotid bifurcations bilaterally. Other: No additional soft tissue abnormalities are seen. IMPRESSION: 1. Acute subdural hematoma tracking along the posterior left parietal lobe, measuring up to 1.1 cm in thickness, with mild associated mass effect. Approximately 2 mm of rightward midline shift at this time. 2. No evidence of fracture or subluxation along the cervical spine. 3. Soft tissue swelling lateral to the left orbit and overlying the left frontal calvarium. 4. Mild small vessel ischemic microangiopathy. 5. Mild degenerative change at the lower cervical spine. Narrowing of the spinal canal to 8 mm at C6-C7, due to a posterior disc osteophyte complex. 6. Complete opacification of the mastoid air cells bilaterally. 7. Emphysema at the lung apices. 8. Mild calcification at the carotid bifurcations bilaterally. Carotid ultrasound could be considered for further evaluation, when and as deemed clinically appropriate. Emphysema (ICD10-J43.9). Critical Value/emergent  results were called by telephone at the time of interpretation on 03/09/2018 at 3:16 am to Dr. Joseph Berkshire, who verbally acknowledged these results. Electronically Signed   By: Garald Balding M.D.   On: 03/09/2018 03:18   Ct Cervical Spine Wo Contrast  Result Date: 03/09/2018 CLINICAL DATA:  Status post unwitnessed fall. Patient found down. Acute onset of left-sided head pain. Concern for cervical spine injury. Initial encounter. EXAM: CT HEAD WITHOUT CONTRAST CT CERVICAL SPINE WITHOUT CONTRAST TECHNIQUE: Multidetector CT imaging of the head and cervical spine was performed following the standard protocol without intravenous contrast. Multiplanar CT image reconstructions of the cervical spine were also generated. COMPARISON:  CT of the head performed 05/09/2017, and CT of the cervical spine performed 12/26/2016 FINDINGS: CT HEAD FINDINGS Brain: There is acute subdural hematoma tracking along the posterior left parietal lobe, measuring up to 1.1 cm in thickness, with mild associated mass effect. There is approximately 2 mm of rightward midline shift at this time. No evidence of acute infarction or hydrocephalus. Mild periventricular white matter change likely reflects small vessel ischemic microangiopathy. Mild cerebellar atrophy is noted. The brainstem and fourth ventricle are within normal limits. The third and lateral ventricles, and basal ganglia are unremarkable in appearance. Vascular: No hyperdense vessel or unexpected calcification. Skull: There is no evidence of fracture; visualized osseous structures are unremarkable in appearance. Sinuses/Orbits: The orbits are within normal limits. There is complete opacification of the mastoid air cells bilaterally. The paranasal sinuses are well-aerated. Other: Soft tissue swelling is noted lateral to the left orbit and overlying the left frontal calvarium. CT CERVICAL SPINE FINDINGS Alignment: Normal. Skull base and vertebrae: No acute fracture. No primary  bone lesion or focal pathologic process. Soft tissues and spinal canal: No prevertebral fluid or swelling. No visible canal hematoma. Disc levels: There is mild intervertebral disc space narrowing at C5-C6, with small anterior and posterior disc osteophyte complexes. A posterior disc osteophyte complex is noted at C6-C7, with narrowing of the spinal canal to 8 mm at this level. Facet disease is noted along the cervical spine. Degenerative change is noted about the dens. Upper chest: Emphysema is noted at the lung apices. The visualized portions of the thyroid gland are unremarkable. Mild calcification is noted at the carotid bifurcations bilaterally. Other: No additional soft tissue abnormalities are seen. IMPRESSION: 1. Acute subdural hematoma tracking along the posterior left parietal lobe, measuring up to 1.1 cm in thickness, with mild associated mass effect. Approximately 2 mm of rightward midline shift at this time. 2. No evidence of fracture  or subluxation along the cervical spine. 3. Soft tissue swelling lateral to the left orbit and overlying the left frontal calvarium. 4. Mild small vessel ischemic microangiopathy. 5. Mild degenerative change at the lower cervical spine. Narrowing of the spinal canal to 8 mm at C6-C7, due to a posterior disc osteophyte complex. 6. Complete opacification of the mastoid air cells bilaterally. 7. Emphysema at the lung apices. 8. Mild calcification at the carotid bifurcations bilaterally. Carotid ultrasound could be considered for further evaluation, when and as deemed clinically appropriate. Emphysema (ICD10-J43.9). Critical Value/emergent results were called by telephone at the time of interpretation on 03/09/2018 at 3:16 am to Dr. Joseph Berkshire, who verbally acknowledged these results. Electronically Signed   By: Garald Balding M.D.   On: 03/09/2018 03:18   Dg Hip Unilat W Or Wo Pelvis 2-3 Views Left  Result Date: 03/09/2018 CLINICAL DATA:  Pain after fall. EXAM:  DG HIP (WITH OR WITHOUT PELVIS) 2-3V LEFT COMPARISON:  None. FINDINGS: There is a fracture through the left femoral neck and another through the left superior pubic ramus. IMPRESSION: Fractures through the left femoral neck and the left superior pubic ramus. Electronically Signed   By: Dorise Bullion III M.D   On: 03/09/2018 02:51    Review of Systems  Constitutional: Negative for weight loss.  HENT: Negative for ear discharge, ear pain, hearing loss and tinnitus.   Eyes: Negative for blurred vision, double vision, photophobia and pain.  Respiratory: Negative for cough, sputum production and shortness of breath.   Cardiovascular: Negative for chest pain.  Gastrointestinal: Negative for abdominal pain, nausea and vomiting.  Genitourinary: Negative for dysuria, flank pain, frequency and urgency.  Musculoskeletal: Positive for joint pain (Left hip). Negative for back pain, falls, myalgias and neck pain.  Neurological: Negative for dizziness, tingling, sensory change, focal weakness, loss of consciousness and headaches.  Endo/Heme/Allergies: Does not bruise/bleed easily.  Psychiatric/Behavioral: Negative for depression, memory loss and substance abuse. The patient is not nervous/anxious.    Blood pressure (!) 100/56, pulse (!) 58, temperature 97.9 F (36.6 Steve), temperature source Oral, resp. rate 20, height 5' 10"  (1.778 m), weight 68 kg, SpO2 95 %. Physical Exam  Constitutional: He appears well-developed and well-nourished. No distress.  HENT:  Head: Normocephalic.  Eyes: Conjunctivae are normal. Right eye exhibits no discharge. Left eye exhibits no discharge. No scleral icterus.  Neck: Normal range of motion.  Cardiovascular: Normal rate and regular rhythm.  Respiratory: Effort normal. No respiratory distress.  Musculoskeletal:  LLE No traumatic wounds, ecchymosis, or rash  TTP hip  No knee or ankle effusion  Knee stable to varus/ valgus and anterior/posterior stress  Sens DPN, SPN, TN  intact  Motor EHL, ext, flex, evers 5/5  DP 1+, PT 0, No significant edema  Neurological: He is alert.  Skin: Skin is warm and dry. He is not diaphoretic.  Psychiatric: He has a normal mood and affect. His behavior is normal.    Assessment/Plan: Fall Left hip fx -- Plan cannulated hip pinning this afternoon by Dr. Percell Miller. Please keep NPO until then.  Left SDH -- Dr. Arnoldo Morale cleared for surgery today, advised no anticoagulation. Multiple medical problems including afib on Eliquis, HTN, BPH, CAD, COPD, CHF, and SSS s/p pacer -- per primary team    Lisette Abu, PA-[facility] [phone] 03/09/2018, 8:56 AM

## 2018-03-09 NOTE — ED Notes (Signed)
MD paged regarding pt requesting pain meds

## 2018-03-09 NOTE — ED Provider Notes (Addendum)
[place] EMERGENCY DEPARTMENT Provider Note   CSN: [id number] Arrival date & time: 03/09/18  0215     History   Chief Complaint Chief Complaint  Patient presents with  . Fall    HPI Steve Norris is a 84 y.o. male.  Patient presents to the emergency department for evaluation after a fall.  Patient was found on the floor in his room.  He reports that he was walking across the room when he tripped over something, falling onto his left side.  Patient complains of pain on the left side of his head from where he hit the ground.  He does not think he lost consciousness.  He does have pain in the left hip, but EMS report that he was ambulatory prior to their arrival.  Patient denies neck and back pain.     Past Medical History:  Diagnosis Date  . Arthritis   . Asthma   . Barrett's esophagus 03-19-2009   EGD  . CAD (coronary artery disease)    a. s/p stent placement in 2006 to the LAD and RCA  . COPD (chronic obstructive pulmonary disease) ([place])   . Dementia ([place])   . Diverticulosis 03-19-2009   Colonoscopy   . Erosive esophagitis 03-19-2009   EGD  . GERD (gastroesophageal reflux disease)   . Hiatal hernia 03-19-2009   EGD  . Hyperlipidemia   . Hypertension   . Internal hemorrhoids 03-19-2009   Colonoscopy  . Paroxysmal atrial fibrillation (HCC)   . Sinus node dysfunction (HCC)    a. s/p PPM placement in 2011  . Subdural hematoma ([place]) 05/08/2017   small, left/notes 05/08/2017    Patient Active Problem List   Diagnosis Date Noted  . Iron deficiency anemia 01/25/2018  . Palliative care encounter 08/24/2017  . Subdural hematoma ([place]) 05/08/2017  . New onset seizure ([place]) 12/23/2016  . Acute left otitis media 12/23/2016  . Acute encephalopathy 12/23/2016  . PAF (paroxysmal atrial fibrillation) ([place]) 12/23/2016  . Benign essential HTN 12/23/2016  . Chronic diastolic heart failure, NYHA class 2 ([place]) 12/23/2016  . Hyperlipidemia 12/23/2016  .  Dementia ([place]) 12/23/2016  . Asthma 12/23/2016  . Elevated AST (SGOT) 12/23/2016  . Acute diastolic CHF (congestive heart failure) ([address] [address])   . Pressure injury of skin 08/09/2016  . BPH (benign prostatic hyperplasia) 08/09/2016  . HTN (hypertension) 08/09/2016  . CHF exacerbation ([place]) 08/08/2016  . Anemia 03/25/2016  . SSS (sick sinus syndrome) ([place]) 03/25/2016  . CAP (community acquired pneumonia)   . Atrial fibrillation with rapid ventricular response ([place]) 11/04/2014  . Dyspnea 12/15/2012  . Long term current use of anticoagulant therapy 07/14/2012  . COPD (chronic obstructive pulmonary disease) ([place]) 02/23/2012  . CAD (coronary artery disease) 02/23/2012  . S/P placement of cardiac pacemaker 02/23/2012  . Atrial fibrillation ([place]) 02/23/2012  . Barrett's esophagus 04/30/2009    Past Surgical History:  Procedure Laterality Date  . CARDIAC CATHETERIZATION  02/12/2009   mild ostial left main disease,patent LAD & RCA stents   . CARDIOVERSION N/A 11/06/2014   Procedure: CARDIOVERSION;  Surgeon: [name], MD;  Location: [facility] ENDOSCOPY;  Service: Cardiovascular;  Laterality: N/A;  . CARDIOVERSION N/A 08/02/2015   Procedure: CARDIOVERSION;  Surgeon: Sueanne Margarita, MD;  Location: [facility] ENDOSCOPY;  Service: Cardiovascular;  Laterality: N/A;  . CATARACT EXTRACTION    . COLONOSCOPY    . CORONARY ANGIOPLASTY WITH STENT PLACEMENT  2006   Right Coronary Artery Cypher stents placed 2006  .  HEMORRHOID SURGERY    . INSERT / REPLACE / REMOVE PACEMAKER  07/25/2009   [employer]  . LEFT HEART CATHETERIZATION WITH CORONARY ANGIOGRAM N/A 07/01/2013   Procedure: LEFT HEART CATHETERIZATION WITH CORONARY ANGIOGRAM;  Surgeon: Sanda Klein, MD;  Location: [facility] CATH LAB;  Service: Cardiovascular;  Laterality: N/A;  . NM MYOCAR PERF WALL MOTION  08/14/2008   no significant ischemia EF 64%  . TEE WITHOUT CARDIOVERSION N/A 08/02/2015   Procedure: TRANSESOPHAGEAL ECHOCARDIOGRAM (TEE);  Surgeon: Sueanne Margarita, MD;  Location: [facility] ENDOSCOPY;  Service: Cardiovascular;  Laterality: N/A;  . TONSILLECTOMY    . US ECHOCARDIOGRAPHY  07/12/2009   borderline LA enlargement,mild mitral annular ca+, AOV mildly sclerotic,trace AI.  Marland Kitchen VASECTOMY          Home Medications    Prior to Admission medications   Medication Sig Start Date End Date Taking? Authorizing Provider  acetaminophen (TYLENOL) 500 MG tablet Take 500 mg by mouth every 6 (six) hours as needed for mild pain or fever.    Yes [provider]  acetaminophen (TYLENOL) 500 MG tablet Take 1,000 mg by mouth 2 (two) times daily.   Yes [provider]  albuterol (ACCUNEB) 0.63 MG/3ML nebulizer solution Take 1 ampule by nebulization every 8 (eight) hours as needed for wheezing.   Yes [provider]  apixaban (ELIQUIS) 2.5 MG TABS tablet Take 1 tablet (2.5 mg total) by mouth 2 (two) times daily. 01/01/18  Yes Croitoru, Mihai, MD  Cholecalciferol (VITAMIN D3) 2000 units capsule Take 4,000 Units by mouth daily.   Yes [provider]  divalproex (DEPAKOTE) 250 MG DR tablet TAKE (1) TABLET BY MOUTH TWICE DAILY. Patient taking differently: Take 250 mg by mouth 2 (two) times daily.  02/23/17  Yes Garvin Fila, MD  feeding supplement, ENSURE ENLIVE, (ENSURE ENLIVE) LIQD Take 1 Bottle by mouth 2 (two) times daily between meals.   Yes [provider]  ferrous sulfate (FEROSUL) 325 (65 FE) MG tablet Take 325 mg by mouth 2 (two) times daily with a meal.   Yes [provider]  finasteride (PROSCAR) 5 MG tablet Take 5 mg by mouth daily.  02/17/12  Yes [provider]  Fluticasone-Salmeterol (ADVAIR) 250-50 MCG/DOSE AEPB Inhale 1 puff into the lungs 2 (two) times daily.   Yes [provider]  lactulose (CHRONULAC) 10 GM/15ML solution Take 20 g by mouth daily.    Yes [provider]  mineral oil liquid Place 0.05 mLs in ear(s) once a week. On Thursday   Yes [provider]    mirtazapine (REMERON) 7.5 MG tablet Take 7.5 mg by mouth at bedtime.   Yes [provider]  nitroGLYCERIN (NITROSTAT) 0.4 MG SL tablet Place 0.4 mg under the tongue every 5 (five) minutes as needed for chest pain.    Yes [provider]  Olopatadine HCl (PATADAY) 0.2 % SOLN Place 1 drop into both eyes daily.    Yes [provider]  rosuvastatin (CRESTOR) 10 MG tablet Take 10 mg by mouth daily.   Yes [provider]  Skin Protectants, Misc. (MINERIN) CREA Apply 1 application topically at bedtime. To back   Yes [provider]  sotalol (BETAPACE) 80 MG tablet take 1 tablet by mouth every 12 hours Patient taking differently: 80 mg oral twice daily 02/11/16  Yes Croitoru, Mihai, MD  tamsulosin (FLOMAX) 0.4 MG CAPS capsule Take 1 capsule by mouth daily. 12/27/15  Yes [provider]  Whey Protein POWD  Take 1 Scoop by mouth 2 (two) times daily.   Yes [provider]  potassium chloride SA (K-DUR,KLOR-CON) 20 MEQ tablet take 1 tablet by mouth once daily Patient not taking: Reported on 03/09/2018 12/18/15   Croitoru, Dani Gobble, MD    Family History Family History  Problem Relation Age of Onset  . Heart disease Mother   . Heart disease Father   . Colon cancer Neg Hx     Social History Social History   Tobacco Use  . Smoking status: Former Research scientist (life sciences)  . Smokeless tobacco: Never Used  . Tobacco comment: Quit smoking 1996  Substance Use Topics  . Alcohol use: No  . Drug use: No     Allergies   Amiodarone; Bactrim [sulfamethoxazole-trimethoprim]; and Penicillins   Review of Systems Review of Systems  Skin: Positive for wound.  Neurological: Positive for headaches.  All other systems reviewed and are negative.    Physical Exam Updated Vital Signs BP (!) 149/82   Pulse 78   Temp 97.9 F (36.6 C) (Oral)   Resp (!) 21   Ht 5\' 10"  (1.778 m)   Wt 68 kg   SpO2 92%   BMI 21.52 kg/m   Physical Exam  Constitutional: He is  oriented to person, place, and time. He appears well-developed and well-nourished. No distress.  HENT:  Head: Normocephalic. Head is with contusion (Contusion with ecchymosis and superficial skin tears left cheek and lateral to the left eye).  Right Ear: Hearing normal.  Left Ear: Hearing normal.  Nose: Nose normal.  Mouth/Throat: Oropharynx is clear and moist and mucous membranes are normal.  Eyes: Pupils are equal, round, and reactive to light. Conjunctivae and EOM are normal.  Neck: Normal range of motion. Neck supple.  Cardiovascular: Regular rhythm, S1 normal and S2 normal. Exam reveals no gallop and no friction rub.  No murmur heard. Pulmonary/Chest: Effort normal and breath sounds normal. No respiratory distress. He exhibits no tenderness.  Abdominal: Soft. Normal appearance and bowel sounds are normal. There is no hepatosplenomegaly. There is no tenderness. There is no rebound, no guarding, no tenderness at McBurney's point and negative Murphy's sign. No hernia.  Musculoskeletal: Normal range of motion.       Left hip: He exhibits tenderness. He exhibits normal range of motion and no deformity.  Neurological: He is alert and oriented to person, place, and time. He has normal strength. No cranial nerve deficit or sensory deficit. Coordination normal. GCS eye subscore is 4. GCS verbal subscore is 5. GCS motor subscore is 6.  Skin: Skin is warm, dry and intact. No rash noted. No cyanosis.  Psychiatric: He has a normal mood and affect. His speech is normal and behavior is normal. Thought content normal.  Nursing note and vitals reviewed.    ED Treatments / Results  Labs (all labs ordered are listed, but only abnormal results are displayed) Labs Reviewed  CBC - Abnormal; Notable for the following components:      Result Value   RBC 3.04 (*)    Hemoglobin 9.3 (*)    HCT 30.9 (*)    MCV 101.6 (*)    RDW 21.8 (*)    All other components within normal limits  BASIC METABOLIC PANEL -  Abnormal; Notable for the following components:   Sodium 132 (*)    Glucose, Bld 100 (*)    Calcium 8.1 (*)    All other components within normal limits    EKG None  Radiology Ct Head Wo  Contrast  Result Date: 03/09/2018 CLINICAL DATA:  Status post unwitnessed fall. Patient found down. Acute onset of left-sided head pain. Concern for cervical spine injury. Initial encounter. EXAM: CT HEAD WITHOUT CONTRAST CT CERVICAL SPINE WITHOUT CONTRAST TECHNIQUE: Multidetector CT imaging of the head and cervical spine was performed following the standard protocol without intravenous contrast. Multiplanar CT image reconstructions of the cervical spine were also generated. COMPARISON:  CT of the head performed 05/09/2017, and CT of the cervical spine performed 12/26/2016 FINDINGS: CT HEAD FINDINGS Brain: There is acute subdural hematoma tracking along the posterior left parietal lobe, measuring up to 1.1 cm in thickness, with mild associated mass effect. There is approximately 2 mm of rightward midline shift at this time. No evidence of acute infarction or hydrocephalus. Mild periventricular white matter change likely reflects small vessel ischemic microangiopathy. Mild cerebellar atrophy is noted. The brainstem and fourth ventricle are within normal limits. The third and lateral ventricles, and basal ganglia are unremarkable in appearance. Vascular: No hyperdense vessel or unexpected calcification. Skull: There is no evidence of fracture; visualized osseous structures are unremarkable in appearance. Sinuses/Orbits: The orbits are within normal limits. There is complete opacification of the mastoid air cells bilaterally. The paranasal sinuses are well-aerated. Other: Soft tissue swelling is noted lateral to the left orbit and overlying the left frontal calvarium. CT CERVICAL SPINE FINDINGS Alignment: Normal. Skull base and vertebrae: No acute fracture. No primary bone lesion or focal pathologic process. Soft tissues  and spinal canal: No prevertebral fluid or swelling. No visible canal hematoma. Disc levels: There is mild intervertebral disc space narrowing at C5-C6, with small anterior and posterior disc osteophyte complexes. A posterior disc osteophyte complex is noted at C6-C7, with narrowing of the spinal canal to 8 mm at this level. Facet disease is noted along the cervical spine. Degenerative change is noted about the dens. Upper chest: Emphysema is noted at the lung apices. The visualized portions of the thyroid gland are unremarkable. Mild calcification is noted at the carotid bifurcations bilaterally. Other: No additional soft tissue abnormalities are seen. IMPRESSION: 1. Acute subdural hematoma tracking along the posterior left parietal lobe, measuring up to 1.1 cm in thickness, with mild associated mass effect. Approximately 2 mm of rightward midline shift at this time. 2. No evidence of fracture or subluxation along the cervical spine. 3. Soft tissue swelling lateral to the left orbit and overlying the left frontal calvarium. 4. Mild small vessel ischemic microangiopathy. 5. Mild degenerative change at the lower cervical spine. Narrowing of the spinal canal to 8 mm at C6-C7, due to a posterior disc osteophyte complex. 6. Complete opacification of the mastoid air cells bilaterally. 7. Emphysema at the lung apices. 8. Mild calcification at the carotid bifurcations bilaterally. Carotid ultrasound could be considered for further evaluation, when and as deemed clinically appropriate. Emphysema (ICD10-J43.9). Critical Value/emergent results were called by telephone at the time of interpretation on 03/09/2018 at 3:16 am to Dr. Joseph Berkshire, who verbally acknowledged these results. Electronically Signed   By: Garald Balding M.D.   On: 03/09/2018 03:18   Ct Cervical Spine Wo Contrast  Result Date: 03/09/2018 CLINICAL DATA:  Status post unwitnessed fall. Patient found down. Acute onset of left-sided head pain.  Concern for cervical spine injury. Initial encounter. EXAM: CT HEAD WITHOUT CONTRAST CT CERVICAL SPINE WITHOUT CONTRAST TECHNIQUE: Multidetector CT imaging of the head and cervical spine was performed following the standard protocol without intravenous contrast. Multiplanar CT image reconstructions of the cervical spine were also  generated. COMPARISON:  CT of the head performed 05/09/2017, and CT of the cervical spine performed 12/26/2016 FINDINGS: CT HEAD FINDINGS Brain: There is acute subdural hematoma tracking along the posterior left parietal lobe, measuring up to 1.1 cm in thickness, with mild associated mass effect. There is approximately 2 mm of rightward midline shift at this time. No evidence of acute infarction or hydrocephalus. Mild periventricular white matter change likely reflects small vessel ischemic microangiopathy. Mild cerebellar atrophy is noted. The brainstem and fourth ventricle are within normal limits. The third and lateral ventricles, and basal ganglia are unremarkable in appearance. Vascular: No hyperdense vessel or unexpected calcification. Skull: There is no evidence of fracture; visualized osseous structures are unremarkable in appearance. Sinuses/Orbits: The orbits are within normal limits. There is complete opacification of the mastoid air cells bilaterally. The paranasal sinuses are well-aerated. Other: Soft tissue swelling is noted lateral to the left orbit and overlying the left frontal calvarium. CT CERVICAL SPINE FINDINGS Alignment: Normal. Skull base and vertebrae: No acute fracture. No primary bone lesion or focal pathologic process. Soft tissues and spinal canal: No prevertebral fluid or swelling. No visible canal hematoma. Disc levels: There is mild intervertebral disc space narrowing at C5-C6, with small anterior and posterior disc osteophyte complexes. A posterior disc osteophyte complex is noted at C6-C7, with narrowing of the spinal canal to 8 mm at this level. Facet  disease is noted along the cervical spine. Degenerative change is noted about the dens. Upper chest: Emphysema is noted at the lung apices. The visualized portions of the thyroid gland are unremarkable. Mild calcification is noted at the carotid bifurcations bilaterally. Other: No additional soft tissue abnormalities are seen. IMPRESSION: 1. Acute subdural hematoma tracking along the posterior left parietal lobe, measuring up to 1.1 cm in thickness, with mild associated mass effect. Approximately 2 mm of rightward midline shift at this time. 2. No evidence of fracture or subluxation along the cervical spine. 3. Soft tissue swelling lateral to the left orbit and overlying the left frontal calvarium. 4. Mild small vessel ischemic microangiopathy. 5. Mild degenerative change at the lower cervical spine. Narrowing of the spinal canal to 8 mm at C6-C7, due to a posterior disc osteophyte complex. 6. Complete opacification of the mastoid air cells bilaterally. 7. Emphysema at the lung apices. 8. Mild calcification at the carotid bifurcations bilaterally. Carotid ultrasound could be considered for further evaluation, when and as deemed clinically appropriate. Emphysema (ICD10-J43.9). Critical Value/emergent results were called by telephone at the time of interpretation on 03/09/2018 at 3:16 am to Dr. Joseph Berkshire, who verbally acknowledged these results. Electronically Signed   By: Garald Balding M.D.   On: 03/09/2018 03:18   Dg Hip Unilat W Or Wo Pelvis 2-3 Views Left  Result Date: 03/09/2018 CLINICAL DATA:  Pain after fall. EXAM: DG HIP (WITH OR WITHOUT PELVIS) 2-3V LEFT COMPARISON:  None. FINDINGS: There is a fracture through the left femoral neck and another through the left superior pubic ramus. IMPRESSION: Fractures through the left femoral neck and the left superior pubic ramus. Electronically Signed   By: Dorise Bullion III M.D   On: 03/09/2018 02:51    Procedures .Marland KitchenLaceration Repair Date/Time:  03/09/2018 4:51 AM Performed by: Orpah Greek, MD Authorized by: Orpah Greek, MD   Consent:    Consent obtained:  Verbal   Consent given by:  Patient   Risks discussed:  Pain Universal protocol:    Procedure explained and questions answered to patient or proxy's satisfaction:  yes     Site/side marked: yes     Immediately prior to procedure, a time out was called: yes     Patient identity confirmed:  Verbally with patient Anesthesia (see MAR for exact dosages):    Anesthesia method:  Local infiltration   Local anesthetic:  Lidocaine 2% WITH epi Laceration details:    Location:  Face   Face location:  L eyebrow   Length (cm):  0.5 Repair type:    Repair type:  Simple Pre-procedure details:    Preparation:  Patient was prepped and draped in usual sterile fashion Exploration:    Hemostasis achieved with:  Epinephrine   Contaminated: no   Treatment:    Area cleansed with:  Betadine Skin repair:    Repair method:  Sutures   Suture size:  3-0   Wound skin closure material used: vicryl rapide.   Number of sutures:  1 Approximation:    Approximation:  Close Post-procedure details:    Dressing:  Sterile dressing   Patient tolerance of procedure:  Tolerated well, no immediate complications   (including critical care time)  Medications Ordered in ED Medications  acetaminophen (TYLENOL) tablet 650 mg (has no administration in time range)  coag fact Xa recombinant (ANDEXXA) low dose infusion 900 mg (900 mg Intravenous New Bag/Given 03/09/18 0448)     Initial Impression / Assessment and Plan / ED Course  I have reviewed the triage vital signs and the nursing notes.  Pertinent labs & imaging results that were available during my care of the patient were reviewed by me and considered in my medical decision making (see chart for details).     Patient presents to the emergency department for evaluation after a fall.  Patient had an unwitnessed fall at nursing  home.  He came in with hematoma to left side of his head with superficial skin tears, no active bleeding.  Patient went to CT and had a head CT performed.  This does show small subdural hematoma.  While in CT the hematoma opened and he had a large amount of bleeding from the sites, this was treated with hemostatic dressings and direct pressure, but bleeding continued.  A figure-of-eight stitch was therefore placed with good hemostasis.  Patient is on Eliquis, last dose 5 PM yesterday.  Will require reversal agent.  Discussed briefly with Dr. Arnoldo Morale, no surgical intervention necessary.  Admit to medicine and he will see patient in morning.  Patient complaining of left hip pain at arrival.  EMS report that he was able to bear weight on the hip and I was able to manipulate it without difficulty, but he did complain of tenderness.  No obvious deformity on examination.  X-ray, however, does show evidence of a fracture of the femoral neck.  Discussed with Dr. Percell Miller, orthopedics.  Will follow patient.  CRITICAL CARE Performed by: Orpah Greek   Total critical care time: 35 minutes  Critical care time was exclusive of separately billable procedures and treating other patients.  Critical care was necessary to treat or prevent imminent or life-threatening deterioration.  Critical care was time spent personally by me on the following activities: development of treatment plan with patient and/or surrogate as well as nursing, discussions with consultants, evaluation of patient's response to treatment, examination of patient, obtaining history from patient or surrogate, ordering and performing treatments and interventions, ordering and review of laboratory studies, ordering and review of radiographic studies, pulse oximetry and re-evaluation of patient's condition.   Final  Clinical Impressions(s) / ED Diagnoses   Final diagnoses:  SDH (subdural hematoma) (HCC)  Closed fracture of neck of left  femur, initial encounter [facility])    ED Discharge Orders    None       Pollina, Gwenyth Allegra, MD 03/09/18 0410    Orpah Greek, MD 03/09/18 4619    Orpah Greek, MD 03/09/18 [phone]

## 2018-03-09 NOTE — Consult Note (Signed)
Reason for Consult: Left subdural hematoma Referring Physician: Dr. Leonel Ramsay is an 84 y.o. male.  HPI: The patient is an 84 year old white male resident of a nursing home on Eliquis for A. fib who took a fall yesterday fracturing his left hip.  His scan was obtained which demonstrated a small left subdural hematoma.  His anticoagulation was reversed with Andexxa.  A neurosurgical consultation was requested.  Presently the patient is alert and pleasant.  His son is at the bedside.  He complains of left hip pain.  Orthopedics evaluation is pending.  He denies neck pain.  Past Medical History:  Diagnosis Date  . Arthritis   . Asthma   . Barrett's esophagus 03-19-2009   EGD  . CAD (coronary artery disease)    a. s/p stent placement in 2006 to the LAD and RCA  . COPD (chronic obstructive pulmonary disease) ([place])   . Dementia ([place])   . Diverticulosis 03-19-2009   Colonoscopy   . Erosive esophagitis 03-19-2009   EGD  . GERD (gastroesophageal reflux disease)   . Hiatal hernia 03-19-2009   EGD  . Hyperlipidemia   . Hypertension   . Internal hemorrhoids 03-19-2009   Colonoscopy  . Paroxysmal atrial fibrillation (HCC)   . Sinus node dysfunction (H[facility])    a. s/p PPM placement in 2011  . Subdural hematoma ([place]) 05/08/2017   small, left/notes 05/08/2017    Past Surgical History:  Procedure Laterality Date  . CARDIAC CATHETERIZATION  02/12/2009   mild ostial left main disease,patent LAD & RCA stents   . CARDIOVERSION N/A 11/06/2014   Procedure: CARDIOVERSION;  Surgeon: [name], MD;  Location: [facility] ENDOSCOPY;  Service: Cardiovascular;  Laterality: N/A;  . CARDIOVERSION N/A 08/02/2015   Procedure: CARDIOVERSION;  Surgeon: Sueanne Margarita, MD;  Location: [facility] ENDOSCOPY;  Service: Cardiovascular;  Laterality: N/A;  . CATARACT EXTRACTION    . COLONOSCOPY    . CORONARY ANGIOPLASTY WITH STENT PLACEMENT  2006   Right Coronary Artery Cypher stents placed 2006  . HEMORRHOID SURGERY     . INSERT / REPLACE / REMOVE PACEMAKER  07/25/2009   [employer] Accent  . LEFT HEART CATHETERIZATION WITH CORONARY ANGIOGRAM N/A 07/01/2013   Procedure: LEFT HEART CATHETERIZATION WITH CORONARY ANGIOGRAM;  Surgeon: Sanda Klein, MD;  Location: [facility] CATH LAB;  Service: Cardiovascular;  Laterality: N/A;  . NM MYOCAR PERF WALL MOTION  08/14/2008   no significant ischemia EF 64%  . TEE WITHOUT CARDIOVERSION N/A 08/02/2015   Procedure: TRANSESOPHAGEAL ECHOCARDIOGRAM (TEE);  Surgeon: Sueanne Margarita, MD;  Location: [facility] ENDOSCOPY;  Service: Cardiovascular;  Laterality: N/A;  . TONSILLECTOMY    . US ECHOCARDIOGRAPHY  07/12/2009   borderline LA enlargement,mild mitral annular ca+, AOV mildly sclerotic,trace AI.  Marland Kitchen VASECTOMY      Family History  Problem Relation Age of Onset  . Heart disease Mother   . Heart disease Father   . Colon cancer Neg Hx     Social History:  reports that he has quit smoking. He has never used smokeless tobacco. He reports that he does not drink alcohol or use drugs.  Allergies:  Allergies  Allergen Reactions  . Amiodarone Nausea And Vomiting  . Bactrim [Sulfamethoxazole-Trimethoprim] Other (See Comments)    Unknown reaction. Listed on MAR  . Penicillins Hives, Swelling and Rash    Medications:  I have reviewed the patient's current medications. Prior to Admission:  (Not in a hospital admission) Scheduled: Continuous: PRN: Anti-infectives (From admission, onward)  None       Results for orders placed or performed during the hospital encounter of 03/09/18 (from the past 48 hour(s))  CBC     Status: Abnormal   Collection Time: 03/09/18  4:47 AM  Result Value Ref Range   WBC 8.7 4.0 - 10.5 K/uL   RBC 3.04 (L) 4.22 - 5.81 MIL/uL   Hemoglobin 9.3 (L) 13.0 - 17.0 g/dL   HCT 30.9 (L) 39.0 - 52.0 %   MCV 101.6 (H) 80.0 - 100.0 fL   MCH 30.6 26.0 - 34.0 pg   MCHC 30.1 30.0 - 36.0 g/dL   RDW 21.8 (H) 11.5 - 15.5 %   Platelets 225 150 - 400 K/uL   nRBC 0.0 0.0 -  0.2 %    Comment: Performed at [facility], [address], [place], [place] [address]  Basic metabolic panel     Status: Abnormal   Collection Time: 03/09/18  4:47 AM  Result Value Ref Range   Sodium 132 (L) 135 - 145 mmol/L   Potassium 4.1 3.5 - 5.1 mmol/L   Chloride 102 98 - 111 mmol/L   CO2 24 22 - 32 mmol/L   Glucose, Bld 100 (H) 70 - 99 mg/dL   BUN 14 8 - 23 mg/dL   Creatinine, Ser 0.62 0.61 - 1.24 mg/dL   Calcium 8.1 (L) 8.9 - 10.3 mg/dL   GFR calc non Af Amer >60 >60 mL/min   GFR calc Af Amer >60 >60 mL/min    Comment: (NOTE) The eGFR has been calculated using the CKD EPI equation. This calculation has not been validated in all clinical situations. eGFR's persistently <60 mL/min signify possible Chronic Kidney Disease.    Anion gap 6 5 - 15    Comment: Performed at [facility] [address], [place],  [address]    Ct Head Wo Contrast  Result Date: 03/09/2018 CLINICAL DATA:  Status post unwitnessed fall. Patient found down. Acute onset of left-sided head pain. Concern for cervical spine injury. Initial encounter. EXAM: CT HEAD WITHOUT CONTRAST CT CERVICAL SPINE WITHOUT CONTRAST TECHNIQUE: Multidetector CT imaging of the head and cervical spine was performed following the standard protocol without intravenous contrast. Multiplanar CT image reconstructions of the cervical spine were also generated. COMPARISON:  CT of the head performed 05/09/2017, and CT of the cervical spine performed 12/26/2016 FINDINGS: CT HEAD FINDINGS Brain: There is acute subdural hematoma tracking along the posterior left parietal lobe, measuring up to 1.1 cm in thickness, with mild associated mass effect. There is approximately 2 mm of rightward midline shift at this time. No evidence of acute infarction or hydrocephalus. Mild periventricular white matter change likely reflects small vessel ischemic microangiopathy. Mild cerebellar atrophy is noted. The brainstem and fourth ventricle  are within normal limits. The third and lateral ventricles, and basal ganglia are unremarkable in appearance. Vascular: No hyperdense vessel or unexpected calcification. Skull: There is no evidence of fracture; visualized osseous structures are unremarkable in appearance. Sinuses/Orbits: The orbits are within normal limits. There is complete opacification of the mastoid air cells bilaterally. The paranasal sinuses are well-aerated. Other: Soft tissue swelling is noted lateral to the left orbit and overlying the left frontal calvarium. CT CERVICAL SPINE FINDINGS Alignment: Normal. Skull base and vertebrae: No acute fracture. No primary bone lesion or focal pathologic process. Soft tissues and spinal canal: No prevertebral fluid or swelling. No visible canal hematoma. Disc levels: There is mild intervertebral disc space narrowing at  C5-C6, with small anterior and posterior disc osteophyte complexes. A posterior disc osteophyte complex is noted at C6-C7, with narrowing of the spinal canal to 8 mm at this level. Facet disease is noted along the cervical spine. Degenerative change is noted about the dens. Upper chest: Emphysema is noted at the lung apices. The visualized portions of the thyroid gland are unremarkable. Mild calcification is noted at the carotid bifurcations bilaterally. Other: No additional soft tissue abnormalities are seen. IMPRESSION: 1. Acute subdural hematoma tracking along the posterior left parietal lobe, measuring up to 1.1 cm in thickness, with mild associated mass effect. Approximately 2 mm of rightward midline shift at this time. 2. No evidence of fracture or subluxation along the cervical spine. 3. Soft tissue swelling lateral to the left orbit and overlying the left frontal calvarium. 4. Mild small vessel ischemic microangiopathy. 5. Mild degenerative change at the lower cervical spine. Narrowing of the spinal canal to 8 mm at C6-C7, due to a posterior disc osteophyte complex. 6. Complete  opacification of the mastoid air cells bilaterally. 7. Emphysema at the lung apices. 8. Mild calcification at the carotid bifurcations bilaterally. Carotid ultrasound could be considered for further evaluation, when and as deemed clinically appropriate. Emphysema (ICD10-J43.9). Critical Value/emergent results were called by telephone at the time of interpretation on 03/09/2018 at 3:16 am to Dr. Joseph Berkshire, who verbally acknowledged these results. Electronically Signed   By: Garald Balding M.D.   On: 03/09/2018 03:18   Ct Cervical Spine Wo Contrast  Result Date: 03/09/2018 CLINICAL DATA:  Status post unwitnessed fall. Patient found down. Acute onset of left-sided head pain. Concern for cervical spine injury. Initial encounter. EXAM: CT HEAD WITHOUT CONTRAST CT CERVICAL SPINE WITHOUT CONTRAST TECHNIQUE: Multidetector CT imaging of the head and cervical spine was performed following the standard protocol without intravenous contrast. Multiplanar CT image reconstructions of the cervical spine were also generated. COMPARISON:  CT of the head performed 05/09/2017, and CT of the cervical spine performed 12/26/2016 FINDINGS: CT HEAD FINDINGS Brain: There is acute subdural hematoma tracking along the posterior left parietal lobe, measuring up to 1.1 cm in thickness, with mild associated mass effect. There is approximately 2 mm of rightward midline shift at this time. No evidence of acute infarction or hydrocephalus. Mild periventricular white matter change likely reflects small vessel ischemic microangiopathy. Mild cerebellar atrophy is noted. The brainstem and fourth ventricle are within normal limits. The third and lateral ventricles, and basal ganglia are unremarkable in appearance. Vascular: No hyperdense vessel or unexpected calcification. Skull: There is no evidence of fracture; visualized osseous structures are unremarkable in appearance. Sinuses/Orbits: The orbits are within normal limits. There is  complete opacification of the mastoid air cells bilaterally. The paranasal sinuses are well-aerated. Other: Soft tissue swelling is noted lateral to the left orbit and overlying the left frontal calvarium. CT CERVICAL SPINE FINDINGS Alignment: Normal. Skull base and vertebrae: No acute fracture. No primary bone lesion or focal pathologic process. Soft tissues and spinal canal: No prevertebral fluid or swelling. No visible canal hematoma. Disc levels: There is mild intervertebral disc space narrowing at C5-C6, with small anterior and posterior disc osteophyte complexes. A posterior disc osteophyte complex is noted at C6-C7, with narrowing of the spinal canal to 8 mm at this level. Facet disease is noted along the cervical spine. Degenerative change is noted about the dens. Upper chest: Emphysema is noted at the lung apices. The visualized portions of the thyroid gland are unremarkable. Mild calcification is noted  at the carotid bifurcations bilaterally. Other: No additional soft tissue abnormalities are seen. IMPRESSION: 1. Acute subdural hematoma tracking along the posterior left parietal lobe, measuring up to 1.1 cm in thickness, with mild associated mass effect. Approximately 2 mm of rightward midline shift at this time. 2. No evidence of fracture or subluxation along the cervical spine. 3. Soft tissue swelling lateral to the left orbit and overlying the left frontal calvarium. 4. Mild small vessel ischemic microangiopathy. 5. Mild degenerative change at the lower cervical spine. Narrowing of the spinal canal to 8 mm at C6-C7, due to a posterior disc osteophyte complex. 6. Complete opacification of the mastoid air cells bilaterally. 7. Emphysema at the lung apices. 8. Mild calcification at the carotid bifurcations bilaterally. Carotid ultrasound could be considered for further evaluation, when and as deemed clinically appropriate. Emphysema (ICD10-J43.9). Critical Value/emergent results were called by telephone at  the time of interpretation on 03/09/2018 at 3:16 am to Dr. Joseph Berkshire, who verbally acknowledged these results. Electronically Signed   By: Garald Balding M.D.   On: 03/09/2018 03:18   Dg Hip Unilat W Or Wo Pelvis 2-3 Views Left  Result Date: 03/09/2018 CLINICAL DATA:  Pain after fall. EXAM: DG HIP (WITH OR WITHOUT PELVIS) 2-3V LEFT COMPARISON:  None. FINDINGS: There is a fracture through the left femoral neck and another through the left superior pubic ramus. IMPRESSION: Fractures through the left femoral neck and the left superior pubic ramus. Electronically Signed   By: Dorise Bullion III M.D   On: 03/09/2018 02:51    ROS: As above Blood pressure 124/65, pulse (!) 58, temperature 97.9 F (36.6 C), temperature source Oral, resp. rate 20, height _0  (1.778 m), weight 68 kg, SpO2 91 %. Estimated body mass index is 21.52 kg/m as calculated from the following:   Height as of this encounter: _1  (1.778 m).   Weight as of this encounter: 68 kg.  Physical Exam  General: An 84 year old hard of hearing white male who complains of left hip pain.  HEENT: The patient has a scalp dressing.  His pupils are equal, extraocular muscles are intact.  He has some left periorbital swelling  Neck: Supple without obvious deformities.  Thorax: Symmetric  Abdomen: Soft  Extremities: Patient has pain in his left hip  Neurologic exam the patient is alert and oriented x3.  Cranial nerves II through XII were grossly normal bilaterally except he has decreased hearing.  The patient's motor strength is 5/5 in spite of biceps, handgrip, gastrocnemius and dorsiflexors.  Cerebellar function is intact to rapid alternating movements of the upper extremities.  Sensory function is intact to light touch in all tested dermatomes bilaterally.  Imaging studies: I have reviewed the patient's head CT performed at [facility] today.  He has a small left acute subdural hematoma without significant  mass-effect.  I reviewed the patient's cervical CT performed most gone hospital today.  He has degenerative changes but no acute findings.  Assessment/Plan: Left subdural hematoma on anticoagulation: I have discussed the situation with the patient and his son.  His Eliquis anticoagulation has been reversed.  We will plan to repeat his CAT scan tomorrow to rule out additional bleeding.  I have answered all their questions.  Ophelia Charter 03/09/2018, 7:34 AM

## 2018-03-09 NOTE — H&P (Signed)
Patient Demographics:    Steve Norris, is a 84 y.o. male  MRN: [id number]   DOB - 1929-07-12  Admit Date - 03/09/2018  Outpatient Primary MD for the patient is Deland Pretty, MD   Assessment & Plan:    Principal Problem:   SDH (subdural hematoma) ([place]) Active Problems:   COPD (chronic obstructive pulmonary disease) ([place])   S/P placement of cardiac pacemaker   Atrial fibrillation ([place])   Long term current use of anticoagulant therapy   SSS (sick sinus syndrome) ([place])   Benign essential HTN   Lt Hip fracture requiring operative repair ([place])   Fall at nursing home   Fall at [facility], initial encounter    1)ICH--- status post fall with acute subdural hematoma--- there is about 76mm of rightward midline shift , neurosurgical consult from Dr. Arnoldo Morale appreciated advised expectant management with repeat CT head on 03/10/2018 , CT of the C-spine without acute findings, monitor neuro status in stepdown unit  2)Lt Femoral Neck Fx-- s/p ORIF on 03/09/18 , further treatment per orthopedic team  3)Lt superior pubic ramus fracture--- status post fall with left hip and Lt  pubic ramus fracture,  conservative management, orthopedic input and physical therapy input appreciated  4)PAFib--- continue sotalol 80 mg twice daily for rate control,, Eliquis on hold due to subdural hematoma, patient received 1 dose of Andexxa to reverse his anticoagulation,   5)Dementia/Depression/Anorexia--stable at this time, patient is cooperative, continue Depakote 250 twice daily, trazodone as needed sleep continue Remeron 7.5 mg nightly  6)BPH with LUTs--okay to continue Proscar and Flomax  With History of - Reviewed by me  Past Medical History:  Diagnosis Date  . Arthritis   . Asthma   . Barrett's esophagus 03-19-2009   EGD  . CAD  (coronary artery disease)    a. s/p stent placement in 2006 to the LAD and RCA  . COPD (chronic obstructive pulmonary disease) ([place])   . Dementia ([place])   . Diverticulosis 03-19-2009   Colonoscopy   . Erosive esophagitis 03-19-2009   EGD  . GERD (gastroesophageal reflux disease)   . Hiatal hernia 03-19-2009   EGD  . Hyperlipidemia   . Hypertension   . Internal hemorrhoids 03-19-2009   Colonoscopy  . Paroxysmal atrial fibrillation ([facility])   . Sinus node dysfunction ([facility])    a. s/p PPM placement in 2011  . Subdural hematoma ([place]) 05/08/2017   small, left/notes 05/08/2017      Past Surgical History:  Procedure Laterality Date  . CARDIAC CATHETERIZATION  02/12/2009   mild ostial left main disease,patent LAD & RCA stents   . CARDIOVERSION N/A 11/06/2014   Procedure: CARDIOVERSION;  Surgeon: [name], MD;  Location: [facility] ENDOSCOPY;  Service: Cardiovascular;  Laterality: N/A;  . CARDIOVERSION N/A 08/02/2015   Procedure: CARDIOVERSION;  Surgeon: Sueanne Margarita, MD;  Location: [facility] ENDOSCOPY;  Service: Cardiovascular;  Laterality: N/A;  . CATARACT EXTRACTION    . COLONOSCOPY    .  CORONARY ANGIOPLASTY WITH STENT PLACEMENT  2006   Right Coronary Artery Cypher stents placed 2006  . HEMORRHOID SURGERY    . INSERT / REPLACE / REMOVE PACEMAKER  07/25/2009   [employer]  . LEFT HEART CATHETERIZATION WITH CORONARY ANGIOGRAM N/A 07/01/2013   Procedure: LEFT HEART CATHETERIZATION WITH CORONARY ANGIOGRAM;  Surgeon: Sanda Klein, MD;  Location: [facility] CATH LAB;  Service: Cardiovascular;  Laterality: N/A;  . NM MYOCAR PERF WALL MOTION  08/14/2008   no significant ischemia EF 64%  . TEE WITHOUT CARDIOVERSION N/A 08/02/2015   Procedure: TRANSESOPHAGEAL ECHOCARDIOGRAM (TEE);  Surgeon: Sueanne Margarita, MD;  Location: [facility] ENDOSCOPY;  Service: Cardiovascular;  Laterality: N/A;  . TONSILLECTOMY    . US ECHOCARDIOGRAPHY  07/12/2009   borderline LA enlargement,mild mitral annular ca+, AOV mildly sclerotic,trace  AI.  Marland Kitchen VASECTOMY      Chief Complaint  Patient presents with  . Fall      HPI:    Steve Norris  is a 84 y.o. male with past medical history relevant for paroxysmal A. fib chronically on Eliquis for anticoagulation, hypertension, dyslipidemia, history of sick sinus syndrome status post prior pacemaker placement, history of GERD, COPD dementia chronic anemia who presented on 03/09/2018 to the ED by EMS after being found on the floor at the skilled nursing facility after presumably falling and striking the left side of his head...... No evidence of trauma or bleeding, patient also had left hip pain  Patient received 1 dose of Andexxa to reverse his anticoagulation  The fall was unwitnessed, patient denies chest pains dizziness he does complain of left hip pain and pain in the left side of his head  Apparently no vomiting, no  Diarrhea  In ED.. CTA shows small subdural hemorrhage, Dr. Arnoldo Morale from neurosurgery was contacted  Hip x-ray showed left hip fracture, orthopedic team was consulted  Patient has been admitted to stepdown unit,  At baseline patient has dementia with cognitive deficits but is able to answer simple questions today    Review of systems:    In addition to the HPI above,   A full Review of  Systems was done, all other systems reviewed are negative except as noted above in HPI , .    Social History:  Reviewed by me    Social History   Tobacco Use  . Smoking status: Former Research scientist (life sciences)  . Smokeless tobacco: Never Used  . Tobacco comment: Quit smoking 1996  Substance Use Topics  . Alcohol use: No     Family History :  Reviewed by me    Family History  Problem Relation Age of Onset  . Heart disease Mother   . Heart disease Father   . Colon cancer Neg Hx      Home Medications:   Prior to Admission medications   Medication Sig Start Date End Date Taking? Authorizing Provider  acetaminophen (TYLENOL) 500 MG tablet Take 500 mg by mouth every 6 (six)  hours as needed for mild pain or fever.    Yes [provider]  acetaminophen (TYLENOL) 500 MG tablet Take 1,000 mg by mouth 2 (two) times daily.   Yes [provider]  albuterol (ACCUNEB) 0.63 MG/3ML nebulizer solution Take 1 ampule by nebulization every 8 (eight) hours as needed for wheezing.   Yes [provider]  apixaban (ELIQUIS) 2.5 MG TABS tablet Take 1 tablet (2.5 mg total) by mouth 2 (two) times daily. 01/01/18  Yes Croitoru, Mihai, MD  Cholecalciferol (VITAMIN D3) 2000  units capsule Take 4,000 Units by mouth daily.   Yes [provider]  divalproex (DEPAKOTE) 250 MG DR tablet TAKE (1) TABLET BY MOUTH TWICE DAILY. Patient taking differently: Take 250 mg by mouth 2 (two) times daily.  02/23/17  Yes Garvin Fila, MD  feeding supplement, ENSURE ENLIVE, (ENSURE ENLIVE) LIQD Take 1 Bottle by mouth 2 (two) times daily between meals.   Yes [provider]  ferrous sulfate (FEROSUL) 325 (65 FE) MG tablet Take 325 mg by mouth 2 (two) times daily with a meal.   Yes [provider]  finasteride (PROSCAR) 5 MG tablet Take 5 mg by mouth daily.  02/17/12  Yes [provider]  Fluticasone-Salmeterol (ADVAIR) 250-50 MCG/DOSE AEPB Inhale 1 puff into the lungs 2 (two) times daily.   Yes [provider]  lactulose (CHRONULAC) 10 GM/15ML solution Take 20 g by mouth daily.    Yes [provider]  mineral oil liquid Place 0.05 mLs in ear(s) once a week. On Thursday   Yes [provider]  mirtazapine (REMERON) 7.5 MG tablet Take 7.5 mg by mouth at bedtime.   Yes [provider]  nitroGLYCERIN (NITROSTAT) 0.4 MG SL tablet Place 0.4 mg under the tongue every 5 (five) minutes as needed for chest pain.    Yes [provider]  Olopatadine HCl (PATADAY) 0.2 % SOLN Place 1 drop into both eyes daily.    Yes [provider]  rosuvastatin (CRESTOR) 10 MG tablet Take 10 mg by mouth daily.   Yes [provider]  Skin Protectants, Misc. (MINERIN) CREA Apply 1 application topically at bedtime. To back   Yes [provider]  sotalol (BETAPACE) 80 MG tablet take 1 tablet by mouth every 12 hours Patient taking differently: 80 mg oral twice daily 02/11/16  Yes Croitoru, Mihai, MD  tamsulosin (FLOMAX) 0.4 MG CAPS capsule Take 1 capsule by mouth daily. 12/27/15  Yes [provider]  Whey Protein POWD Take 1 Scoop by mouth 2 (two) times daily.   Yes [provider]  potassium chloride SA (K-DUR,KLOR-CON) 20 MEQ tablet take 1 tablet by mouth once daily Patient not taking: Reported on 03/09/2018 12/18/15   Croitoru, Dani Gobble, MD     Allergies:     Allergies  Allergen Reactions  . Amiodarone Nausea And Vomiting  . Bactrim [Sulfamethoxazole-Trimethoprim] Other (See Comments)    Unknown reaction. Listed on MAR  . Penicillins Hives, Swelling and Rash     Physical Exam:   Vitals  Blood pressure (!) 106/55, pulse 69, temperature 98.8 F (37.1 C), resp. rate 11, height 5\' 10"  (1.778 m), weight 68 kg, SpO2 96 %.  Physical Examination: General appearance - alert, well appearing, and in no distress  Head----left-sided scalp and periorbital bruising and ecchymoses with hemostatic wounds Mental status - alert, oriented to person, place, and time, Eyes - sclera anicteric Neck - supple, no JVD elevation , Chest - clear  to auscultation bilaterally, symmetrical air movement,  Heart - S1 and S2 normal, irregular, pacemaker in situ Abdomen - soft, nontender, nondistended, no masses or organomegaly Neurological - screening mental status exam normal, neck supple without rigidity, cranial nerves II through XII intact, DTR's normal and symmetric Extremities - no pedal edema noted, intact peripheral pulses  Skin - left-sided scalp and periorbital bruising and ecchymoses with hemostatic wounds MSK--pain with left lower extremity range of motion, left hip tenderness    Data  Review:    CBC Recent Labs  Lab  03/09/18 0447  WBC 8.7  HGB 9.3*  HCT 30.9*  PLT 225  MCV 101.6*  MCH 30.6  MCHC 30.1  RDW 21.8*  -----------------------------------------------------------------------------------------------------------------  Chemistries  Recent Labs  Lab 03/09/18 0447  NA 132*  K 4.1  CL 102  CO2 24  GLUCOSE 100*  BUN 14  CREATININE 0.62  CALCIUM 8.1*   ------------------------------------------------------------------------------------------------------------------ estimated creatinine clearance is 63.8 mL/min (by C-G formula based on SCr of 0.62 mg/dL). ------------------------------------------------------------------------------------------------------------------ No results for input(s): TSH, T4TOTAL, T3FREE, THYROIDAB in the last 72 hours.  Invalid input(s): FREET3   Coagulation profile No results for input(s): INR, PROTIME in the last 168 hours. ------------------------------------------------------------------------------------------------------------------- No results for input(s): DDIMER in the last 72 hours. -------------------------------------------------------------------------------------------------------------------  Cardiac Enzymes No results for input(s): CKMB, TROPONINI, MYOGLOBIN in the last 168 hours.  Invalid input(s): CK ------------------------------------------------------------------------------------------------------------------    Component Value Date/Time   BNP 559.5 (H) 08/08/2016 1935   --------------------------------------------------------------------------------------------------------------  Urinalysis    Component Value Date/Time   COLORURINE YELLOW 05/08/2017 0628   APPEARANCEUR CLEAR 05/08/2017 0628   LABSPEC 1.020 05/08/2017 0628   PHURINE 6.0 05/08/2017 0628   GLUCOSEU NEGATIVE 05/08/2017 0628   HGBUR NEGATIVE 05/08/2017 0628   BILIRUBINUR NEGATIVE 05/08/2017 0628   KETONESUR NEGATIVE  05/08/2017 0628   PROTEINUR NEGATIVE 05/08/2017 0628   NITRITE NEGATIVE 05/08/2017 0628   LEUKOCYTESUR NEGATIVE 05/08/2017 0628    ----------------------------------------------------------------------------------------------------------------   Imaging Results:    Ct Head Wo Contrast  Result Date: 03/09/2018 CLINICAL DATA:  Status post unwitnessed fall. Patient found down. Acute onset of left-sided head pain. Concern for cervical spine injury. Initial encounter. EXAM: CT HEAD WITHOUT CONTRAST CT CERVICAL SPINE WITHOUT CONTRAST TECHNIQUE: Multidetector CT imaging of the head and cervical spine was performed following the standard protocol without intravenous contrast. Multiplanar CT image reconstructions of the cervical spine were also generated. COMPARISON:  CT of the head performed 05/09/2017, and CT of the cervical spine performed 12/26/2016 FINDINGS: CT HEAD FINDINGS Brain: There is acute subdural hematoma tracking along the posterior left parietal lobe, measuring up to 1.1 cm in thickness, with mild associated mass effect. There is approximately 2 mm of rightward midline shift at this time. No evidence of acute infarction or hydrocephalus. Mild periventricular white matter change likely reflects small vessel ischemic microangiopathy. Mild cerebellar atrophy is noted. The brainstem and fourth ventricle are within normal limits. The third and lateral ventricles, and basal ganglia are unremarkable in appearance. Vascular: No hyperdense vessel or unexpected calcification. Skull: There is no evidence of fracture; visualized osseous structures are unremarkable in appearance. Sinuses/Orbits: The orbits are within normal limits. There is complete opacification of the mastoid air cells bilaterally. The paranasal sinuses are well-aerated. Other: Soft tissue swelling is noted lateral to the left orbit and overlying the left frontal calvarium. CT CERVICAL SPINE FINDINGS Alignment: Normal. Skull base and  vertebrae: No acute fracture. No primary bone lesion or focal pathologic process. Soft tissues and spinal canal: No prevertebral fluid or swelling. No visible canal hematoma. Disc levels: There is mild intervertebral disc space narrowing at C5-C6, with small anterior and posterior disc osteophyte complexes. A posterior disc osteophyte complex is noted at C6-C7, with narrowing of the spinal canal to 8 mm at this level. Facet disease is noted along the cervical spine. Degenerative change is noted about the dens. Upper chest: Emphysema is noted at the lung apices. The visualized portions of the thyroid gland are unremarkable. Mild calcification is noted at the carotid bifurcations bilaterally. Other: No additional soft  tissue abnormalities are seen. IMPRESSION: 1. Acute subdural hematoma tracking along the posterior left parietal lobe, measuring up to 1.1 cm in thickness, with mild associated mass effect. Approximately 2 mm of rightward midline shift at this time. 2. No evidence of fracture or subluxation along the cervical spine. 3. Soft tissue swelling lateral to the left orbit and overlying the left frontal calvarium. 4. Mild small vessel ischemic microangiopathy. 5. Mild degenerative change at the lower cervical spine. Narrowing of the spinal canal to 8 mm at C6-C7, due to a posterior disc osteophyte complex. 6. Complete opacification of the mastoid air cells bilaterally. 7. Emphysema at the lung apices. 8. Mild calcification at the carotid bifurcations bilaterally. Carotid ultrasound could be considered for further evaluation, when and as deemed clinically appropriate. Emphysema (ICD10-J43.9). Critical Value/emergent results were called by telephone at the time of interpretation on 03/09/2018 at 3:16 am to Dr. Joseph Berkshire, who verbally acknowledged these results. Electronically Signed   By: Garald Balding M.D.   On: 03/09/2018 03:18   Ct Cervical Spine Wo Contrast  Result Date: 03/09/2018 CLINICAL  DATA:  Status post unwitnessed fall. Patient found down. Acute onset of left-sided head pain. Concern for cervical spine injury. Initial encounter. EXAM: CT HEAD WITHOUT CONTRAST CT CERVICAL SPINE WITHOUT CONTRAST TECHNIQUE: Multidetector CT imaging of the head and cervical spine was performed following the standard protocol without intravenous contrast. Multiplanar CT image reconstructions of the cervical spine were also generated. COMPARISON:  CT of the head performed 05/09/2017, and CT of the cervical spine performed 12/26/2016 FINDINGS: CT HEAD FINDINGS Brain: There is acute subdural hematoma tracking along the posterior left parietal lobe, measuring up to 1.1 cm in thickness, with mild associated mass effect. There is approximately 2 mm of rightward midline shift at this time. No evidence of acute infarction or hydrocephalus. Mild periventricular white matter change likely reflects small vessel ischemic microangiopathy. Mild cerebellar atrophy is noted. The brainstem and fourth ventricle are within normal limits. The third and lateral ventricles, and basal ganglia are unremarkable in appearance. Vascular: No hyperdense vessel or unexpected calcification. Skull: There is no evidence of fracture; visualized osseous structures are unremarkable in appearance. Sinuses/Orbits: The orbits are within normal limits. There is complete opacification of the mastoid air cells bilaterally. The paranasal sinuses are well-aerated. Other: Soft tissue swelling is noted lateral to the left orbit and overlying the left frontal calvarium. CT CERVICAL SPINE FINDINGS Alignment: Normal. Skull base and vertebrae: No acute fracture. No primary bone lesion or focal pathologic process. Soft tissues and spinal canal: No prevertebral fluid or swelling. No visible canal hematoma. Disc levels: There is mild intervertebral disc space narrowing at C5-C6, with small anterior and posterior disc osteophyte complexes. A posterior disc osteophyte  complex is noted at C6-C7, with narrowing of the spinal canal to 8 mm at this level. Facet disease is noted along the cervical spine. Degenerative change is noted about the dens. Upper chest: Emphysema is noted at the lung apices. The visualized portions of the thyroid gland are unremarkable. Mild calcification is noted at the carotid bifurcations bilaterally. Other: No additional soft tissue abnormalities are seen. IMPRESSION: 1. Acute subdural hematoma tracking along the posterior left parietal lobe, measuring up to 1.1 cm in thickness, with mild associated mass effect. Approximately 2 mm of rightward midline shift at this time. 2. No evidence of fracture or subluxation along the cervical spine. 3. Soft tissue swelling lateral to the left orbit and overlying the left frontal calvarium. 4. Mild small  vessel ischemic microangiopathy. 5. Mild degenerative change at the lower cervical spine. Narrowing of the spinal canal to 8 mm at C6-C7, due to a posterior disc osteophyte complex. 6. Complete opacification of the mastoid air cells bilaterally. 7. Emphysema at the lung apices. 8. Mild calcification at the carotid bifurcations bilaterally. Carotid ultrasound could be considered for further evaluation, when and as deemed clinically appropriate. Emphysema (ICD10-J43.9). Critical Value/emergent results were called by telephone at the time of interpretation on 03/09/2018 at 3:16 am to Dr. Joseph Berkshire, who verbally acknowledged these results. Electronically Signed   By: Garald Balding M.D.   On: 03/09/2018 03:18   Dg C-arm 1-60 Min  Result Date: 03/09/2018 CLINICAL DATA:  Thinning of the left hip EXAM: DG C-ARM 61-120 MIN COMPARISON:  Pelvis on left hip films of 03/09/2018 FINDINGS: Three nails are present for fixation of the left subcapital femoral fracture. No complicating features are seen. Fluoroscopy time of 30 seconds was recorded. IMPRESSION: C-arm fluoroscopy provided. Electronically Signed   By: Ivar Drape M.D.   On: 03/09/2018 15:08   Dg Hip Operative Unilat W Or W/o Pelvis Left  Result Date: 03/09/2018 CLINICAL DATA:  Pinning of the left hip EXAM: OPERATIVE left HIP (WITH PELVIS IF PERFORMED) 2 VIEWS TECHNIQUE: Fluoroscopic spot image(s) were submitted for interpretation post-operatively. COMPARISON:  Pelvis on left hip films of 03/09/2018 FINDINGS: Three nails are present for fixation of the left subcapital femoral fracture. No complicating features are seen. IMPRESSION: Nails for fixation of left subcapital femoral fracture. Electronically Signed   By: Ivar Drape M.D.   On: 03/09/2018 15:07   Dg Hip Unilat W Or Wo Pelvis 2-3 Views Left  Result Date: 03/09/2018 CLINICAL DATA:  Pain after fall. EXAM: DG HIP (WITH OR WITHOUT PELVIS) 2-3V LEFT COMPARISON:  None. FINDINGS: There is a fracture through the left femoral neck and another through the left superior pubic ramus. IMPRESSION: Fractures through the left femoral neck and the left superior pubic ramus. Electronically Signed   By: Dorise Bullion III M.D   On: 03/09/2018 02:51    Radiological Exams on Admission: Ct Head Wo Contrast  Result Date: 03/09/2018 CLINICAL DATA:  Status post unwitnessed fall. Patient found down. Acute onset of left-sided head pain. Concern for cervical spine injury. Initial encounter. EXAM: CT HEAD WITHOUT CONTRAST CT CERVICAL SPINE WITHOUT CONTRAST TECHNIQUE: Multidetector CT imaging of the head and cervical spine was performed following the standard protocol without intravenous contrast. Multiplanar CT image reconstructions of the cervical spine were also generated. COMPARISON:  CT of the head performed 05/09/2017, and CT of the cervical spine performed 12/26/2016 FINDINGS: CT HEAD FINDINGS Brain: There is acute subdural hematoma tracking along the posterior left parietal lobe, measuring up to 1.1 cm in thickness, with mild associated mass effect. There is approximately 2 mm of rightward midline shift at this  time. No evidence of acute infarction or hydrocephalus. Mild periventricular white matter change likely reflects small vessel ischemic microangiopathy. Mild cerebellar atrophy is noted. The brainstem and fourth ventricle are within normal limits. The third and lateral ventricles, and basal ganglia are unremarkable in appearance. Vascular: No hyperdense vessel or unexpected calcification. Skull: There is no evidence of fracture; visualized osseous structures are unremarkable in appearance. Sinuses/Orbits: The orbits are within normal limits. There is complete opacification of the mastoid air cells bilaterally. The paranasal sinuses are well-aerated. Other: Soft tissue swelling is noted lateral to the left orbit and overlying the left frontal calvarium. CT CERVICAL SPINE FINDINGS  Alignment: Normal. Skull base and vertebrae: No acute fracture. No primary bone lesion or focal pathologic process. Soft tissues and spinal canal: No prevertebral fluid or swelling. No visible canal hematoma. Disc levels: There is mild intervertebral disc space narrowing at C5-C6, with small anterior and posterior disc osteophyte complexes. A posterior disc osteophyte complex is noted at C6-C7, with narrowing of the spinal canal to 8 mm at this level. Facet disease is noted along the cervical spine. Degenerative change is noted about the dens. Upper chest: Emphysema is noted at the lung apices. The visualized portions of the thyroid gland are unremarkable. Mild calcification is noted at the carotid bifurcations bilaterally. Other: No additional soft tissue abnormalities are seen. IMPRESSION: 1. Acute subdural hematoma tracking along the posterior left parietal lobe, measuring up to 1.1 cm in thickness, with mild associated mass effect. Approximately 2 mm of rightward midline shift at this time. 2. No evidence of fracture or subluxation along the cervical spine. 3. Soft tissue swelling lateral to the left orbit and overlying the left frontal  calvarium. 4. Mild small vessel ischemic microangiopathy. 5. Mild degenerative change at the lower cervical spine. Narrowing of the spinal canal to 8 mm at C6-C7, due to a posterior disc osteophyte complex. 6. Complete opacification of the mastoid air cells bilaterally. 7. Emphysema at the lung apices. 8. Mild calcification at the carotid bifurcations bilaterally. Carotid ultrasound could be considered for further evaluation, when and as deemed clinically appropriate. Emphysema (ICD10-J43.9). Critical Value/emergent results were called by telephone at the time of interpretation on 03/09/2018 at 3:16 am to Dr. Joseph Berkshire, who verbally acknowledged these results. Electronically Signed   By: Garald Balding M.D.   On: 03/09/2018 03:18   Ct Cervical Spine Wo Contrast  Result Date: 03/09/2018 CLINICAL DATA:  Status post unwitnessed fall. Patient found down. Acute onset of left-sided head pain. Concern for cervical spine injury. Initial encounter. EXAM: CT HEAD WITHOUT CONTRAST CT CERVICAL SPINE WITHOUT CONTRAST TECHNIQUE: Multidetector CT imaging of the head and cervical spine was performed following the standard protocol without intravenous contrast. Multiplanar CT image reconstructions of the cervical spine were also generated. COMPARISON:  CT of the head performed 05/09/2017, and CT of the cervical spine performed 12/26/2016 FINDINGS: CT HEAD FINDINGS Brain: There is acute subdural hematoma tracking along the posterior left parietal lobe, measuring up to 1.1 cm in thickness, with mild associated mass effect. There is approximately 2 mm of rightward midline shift at this time. No evidence of acute infarction or hydrocephalus. Mild periventricular white matter change likely reflects small vessel ischemic microangiopathy. Mild cerebellar atrophy is noted. The brainstem and fourth ventricle are within normal limits. The third and lateral ventricles, and basal ganglia are unremarkable in appearance. Vascular:  No hyperdense vessel or unexpected calcification. Skull: There is no evidence of fracture; visualized osseous structures are unremarkable in appearance. Sinuses/Orbits: The orbits are within normal limits. There is complete opacification of the mastoid air cells bilaterally. The paranasal sinuses are well-aerated. Other: Soft tissue swelling is noted lateral to the left orbit and overlying the left frontal calvarium. CT CERVICAL SPINE FINDINGS Alignment: Normal. Skull base and vertebrae: No acute fracture. No primary bone lesion or focal pathologic process. Soft tissues and spinal canal: No prevertebral fluid or swelling. No visible canal hematoma. Disc levels: There is mild intervertebral disc space narrowing at C5-C6, with small anterior and posterior disc osteophyte complexes. A posterior disc osteophyte complex is noted at C6-C7, with narrowing of the spinal canal to 8  mm at this level. Facet disease is noted along the cervical spine. Degenerative change is noted about the dens. Upper chest: Emphysema is noted at the lung apices. The visualized portions of the thyroid gland are unremarkable. Mild calcification is noted at the carotid bifurcations bilaterally. Other: No additional soft tissue abnormalities are seen. IMPRESSION: 1. Acute subdural hematoma tracking along the posterior left parietal lobe, measuring up to 1.1 cm in thickness, with mild associated mass effect. Approximately 2 mm of rightward midline shift at this time. 2. No evidence of fracture or subluxation along the cervical spine. 3. Soft tissue swelling lateral to the left orbit and overlying the left frontal calvarium. 4. Mild small vessel ischemic microangiopathy. 5. Mild degenerative change at the lower cervical spine. Narrowing of the spinal canal to 8 mm at C6-C7, due to a posterior disc osteophyte complex. 6. Complete opacification of the mastoid air cells bilaterally. 7. Emphysema at the lung apices. 8. Mild calcification at the carotid  bifurcations bilaterally. Carotid ultrasound could be considered for further evaluation, when and as deemed clinically appropriate. Emphysema (ICD10-J43.9). Critical Value/emergent results were called by telephone at the time of interpretation on 03/09/2018 at 3:16 am to Dr. Joseph Berkshire, who verbally acknowledged these results. Electronically Signed   By: Garald Balding M.D.   On: 03/09/2018 03:18   Dg C-arm 1-60 Min  Result Date: 03/09/2018 CLINICAL DATA:  Thinning of the left hip EXAM: DG C-ARM 61-120 MIN COMPARISON:  Pelvis on left hip films of 03/09/2018 FINDINGS: Three nails are present for fixation of the left subcapital femoral fracture. No complicating features are seen. Fluoroscopy time of 30 seconds was recorded. IMPRESSION: C-arm fluoroscopy provided. Electronically Signed   By: Ivar Drape M.D.   On: 03/09/2018 15:08   Dg Hip Operative Unilat W Or W/o Pelvis Left  Result Date: 03/09/2018 CLINICAL DATA:  Pinning of the left hip EXAM: OPERATIVE left HIP (WITH PELVIS IF PERFORMED) 2 VIEWS TECHNIQUE: Fluoroscopic spot image(s) were submitted for interpretation post-operatively. COMPARISON:  Pelvis on left hip films of 03/09/2018 FINDINGS: Three nails are present for fixation of the left subcapital femoral fracture. No complicating features are seen. IMPRESSION: Nails for fixation of left subcapital femoral fracture. Electronically Signed   By: Ivar Drape M.D.   On: 03/09/2018 15:07   Dg Hip Unilat W Or Wo Pelvis 2-3 Views Left  Result Date: 03/09/2018 CLINICAL DATA:  Pain after fall. EXAM: DG HIP (WITH OR WITHOUT PELVIS) 2-3V LEFT COMPARISON:  None. FINDINGS: There is a fracture through the left femoral neck and another through the left superior pubic ramus. IMPRESSION: Fractures through the left femoral neck and the left superior pubic ramus. Electronically Signed   By: Dorise Bullion III M.D   On: 03/09/2018 02:51    DVT Prophylaxis -SCD(Eliquis on hold due to subdural  hemorrhage) AM Labs Ordered, also please review Full Orders  Family Communication: Admission, patients condition and plan of care including tests being ordered have been discussed with the patient  who indicate understanding and agree with the plan   Code Status - Full Code  Likely DC to  SNF  Condition   stable  Roxan Hockey M.D on 03/09/2018 at 6:35 PM Pager---[phone] Go to [email or web address] - password [email or web address] for contact info  [facility] - Office  [phone]

## 2018-03-09 NOTE — Progress Notes (Signed)
Patient presented to PACU with a coban wrap around his head, wrapping around his forehead.  It appeared to be very tight, causing swelling along eyebrow line.  RN spoke with Orion Crook, PA for Ortho Trauma who saw patient in the ED.  RN given permission by PA to remove tight dressing and replace appropriately.  RN removed dressing, noting a combination of dark bruising and skin tears from L eyebrow line down side of face along eye and upper cheek.  RN redressed area with vaseline gauze and dry gauze with gentle skin tape. RN documented wound area by adding an appropriate LDA to flowsheet on Epic.

## 2018-03-09 NOTE — Anesthesia Preprocedure Evaluation (Addendum)
Anesthesia Evaluation  Patient identified by MRN, date of birth, ID band Patient awake    Reviewed: Allergy & Precautions, Patient's Chart, lab work & pertinent test results  Airway Mallampati: II  TM Distance: >3 FB     Dental   Pulmonary asthma , COPD, former smoker,    breath sounds clear to auscultation       Cardiovascular hypertension, + CAD, + Cardiac Stents and +CHF  + dysrhythmias Atrial Fibrillation  Rhythm:Regular Rate:Normal     Neuro/Psych Seizures -,  Dementia    GI/Hepatic Neg liver ROS, hiatal hernia, PUD, GERD  ,  Endo/Other  negative endocrine ROS  Renal/GU negative Renal ROS     Musculoskeletal  (+) Arthritis ,   Abdominal   Peds  Hematology   Anesthesia Other Findings   Reproductive/Obstetrics                           Lab Results  Component Value Date   WBC 8.7 03/09/2018   HGB 9.3 (L) 03/09/2018   HCT 30.9 (L) 03/09/2018   MCV 101.6 (H) 03/09/2018   PLT 225 03/09/2018   Lab Results  Component Value Date   INR 1.35 10/06/2017   INR 1.29 05/08/2017   INR 1.31 12/26/2016   Echo: - Left ventricle: The cavity size was normal. Wall thickness was   increased in a pattern of mild LVH. Systolic function was normal.   The estimated ejection fraction was in the range of 60% to 65%.   Wall motion was normal; there were no regional wall motion   abnormalities. Features are consistent with a pseudonormal left   ventricular filling pattern, with concomitant abnormal relaxation   and increased filling pressure (grade 2 diastolic dysfunction). - Left atrium: The atrium was mildly to moderately dilated. - Right atrium: The atrium was moderately to severely dilated.  Anesthesia Physical Anesthesia Plan  ASA: III  Anesthesia Plan: General   Post-op Pain Management:    Induction: Intravenous  PONV Risk Score and Plan: 3 and Ondansetron, Dexamethasone and Treatment may  vary due to age or medical condition  Airway Management Planned: Oral ETT  Additional Equipment: None  Intra-op Plan:   Post-operative Plan: Extubation in OR  Informed Consent: I have reviewed the patients History and Physical, chart, labs and discussed the procedure including the risks, benefits and alternatives for the proposed anesthesia with the patient or authorized representative who has indicated his/her understanding and acceptance.     Plan Discussed with: CRNA and Anesthesiologist  Anesthesia Plan Comments:        Anesthesia Quick Evaluation

## 2018-03-10 ENCOUNTER — Inpatient Hospital Stay (HOSPITAL_COMMUNITY): Payer: Medicare Other

## 2018-03-10 ENCOUNTER — Encounter (HOSPITAL_COMMUNITY): Payer: Self-pay | Admitting: Orthopedic Surgery

## 2018-03-10 DIAGNOSIS — S065X9A Traumatic subdural hemorrhage with loss of consciousness of unspecified duration, initial encounter: Principal | ICD-10-CM

## 2018-03-10 DIAGNOSIS — I10 Essential (primary) hypertension: Secondary | ICD-10-CM

## 2018-03-10 DIAGNOSIS — I480 Paroxysmal atrial fibrillation: Secondary | ICD-10-CM

## 2018-03-10 DIAGNOSIS — J418 Mixed simple and mucopurulent chronic bronchitis: Secondary | ICD-10-CM

## 2018-03-10 LAB — BASIC METABOLIC PANEL
Anion gap: 3 — ABNORMAL LOW (ref 5–15)
BUN: 18 mg/dL (ref 8–23)
CHLORIDE: 104 mmol/L (ref 98–111)
CO2: 26 mmol/L (ref 22–32)
Calcium: 7.9 mg/dL — ABNORMAL LOW (ref 8.9–10.3)
Creatinine, Ser: 0.66 mg/dL (ref 0.61–1.24)
GFR calc Af Amer: >60 mL/min (ref >60)
GFR calc non Af Amer: >60 mL/min (ref >60)
GLUCOSE: 150 mg/dL — AB (ref 70–99)
POTASSIUM: 4.7 mmol/L (ref 3.5–5.1)
Sodium: 133 mmol/L — ABNORMAL LOW (ref 135–145)

## 2018-03-10 LAB — CBC
HEMATOCRIT: 25.9 % — AB (ref 39.0–52.0)
HEMOGLOBIN: 8.1 g/dL — AB (ref 13.0–17.0)
MCH: 31.4 pg (ref 26.0–34.0)
MCHC: 31.3 g/dL (ref 30.0–36.0)
MCV: 100.4 fL — ABNORMAL HIGH (ref 80.0–100.0)
Platelets: 172 10*3/uL (ref 150–400)
RBC: 2.58 MIL/uL — AB (ref 4.22–5.81)
RDW: 21.4 % — ABNORMAL HIGH (ref 11.5–15.5)
WBC: 4.4 10*3/uL (ref 4.0–10.5)
nRBC: 0 % (ref 0.0–0.2)

## 2018-03-10 NOTE — Progress Notes (Signed)
    Subjective: Patient reports pain as mild.  Tolerating some diet.   Not yet OOB.  Objective:   VITALS:   Vitals:   03/09/18 2243 03/09/18 2320 03/10/18 0000 03/10/18 0300  BP:   (!) 102/48 (!) 102/54  Pulse: 71 62    Resp: 11     Temp:    (!) 97.5 F (36.4 C)  TempSrc:    Oral  SpO2: 92% 95%  99%  Weight:      Height:       CBC Latest Ref Rng & Units 03/10/2018 03/09/2018 02/05/2018  WBC 4.0 - 10.5 K/uL 4.4 8.7 7.1  Hemoglobin 13.0 - 17.0 g/dL 8.1(L) 9.3(L) 8.5(L)  Hematocrit 39.0 - 52.0 % 25.9(L) 30.9(L) 28.1(L)  Platelets 150 - 400 K/uL 172 225 263   BMP Latest Ref Rng & Units 03/10/2018 03/09/2018 12/29/2017  Glucose 70 - 99 mg/dL 150(H) 100(H) 119(H)  BUN 8 - 23 mg/dL 18 14 12   Creatinine 0.61 - 1.24 mg/dL 0.66 0.62 0.71  Sodium 135 - 145 mmol/L 133(L) 132(L) 139  Potassium 3.5 - 5.1 mmol/L 4.7 4.1 4.2  Chloride 98 - 111 mmol/L 104 102 103  CO2 22 - 32 mmol/L 26 24 29   Calcium 8.9 - 10.3 mg/dL 7.9(L) 8.1(L) 8.7(L)   Intake/Output      11/12 0701 - 11/13 0700 11/13 0701 - 11/14 0700   P.O. 240    I.V. (mL/kg) 940 (13.8)    IV Piggyback 250    Total Intake(mL/kg) 1430 (21)    Net +1430            Physical Exam: General: NAD.  Supine in bed.  Son at bedside.  Interactive with exam and answers questions appropriately.  No increased work of breathing. MSK LLE: Neurovascularly intact Sensation intact distally Feet warm Dorsiflexion/Plantar flexion intact Incision: dressing C/D/I   Assessment: 1 Day Post-Op  S/P Procedure(s) (LRB): CANNULATED HIP PINNING (N/A) by Dr. Ernesta Amble. Percell Miller on 03/09/2017  Principal Problem:   SDH (subdural hematoma) (HCC) Active Problems:   COPD (chronic obstructive pulmonary disease) (HCC)   S/P placement of cardiac pacemaker   Atrial fibrillation ([place])   Long term current use of anticoagulant therapy   SSS (sick sinus syndrome) (HCC)   Benign essential HTN   Lt Hip fracture requiring operative repair ([place])   Fall at  nursing home   Fall at [facility], initial encounter   Left femoral neck fracture, status post cannulated hip pinning Left pubic rami fracture - non operative. Doing well postop day 1 Pain controlled, minimal. Stable from an orthopedic perspective.  Plan: Up with therapy when able Incentive Spirometry Apply ice as needed   Weightbearing: WBAT LLE Insicional and dressing care: Dressings left intact until follow-up Showering: Keep dressing dry VTE prophylaxis: Defer to primary team due to SDH.  SCDs, ambulation Pain control: Minimize narcotics Follow - up plan: 2 weeks Contact information:  Edmonia Lynch MD, Roxan Hockey PA-C  Dispo: Per primary/neurosurgery team.  Therapy evaluations pending.       Steve Burly III, PA-C 03/10/2018, 8:36 AM

## 2018-03-10 NOTE — Evaluation (Signed)
Physical Therapy Evaluation Patient Details Name: Steve Norris MRN: [id number] DOB: 26-Aug-1929 Today's Date: 03/10/2018   History of Present Illness  84 y.o. male admitted on 03/09/18 for fall with resultant SDH 86mm midline shift, L femur fx s/p nailing WBAT post op.  Pt with significant PMH of falls, dementia, sinus node dysfunction, PAF, erosive esophagitis, Barrett's esophagus, COPD, CAD, HOH (R ear is better).    Clinical Impression  Co-evaluation preformed with OT and pt tolerated OOB to chair well, mod assist overall two people needed for his first time up.  He will likely need SNF level rehab at discharge before returning to ALF level of care.  PT will continue to follow acutely for safe mobility progression    Follow Up Recommendations SNF    Equipment Recommendations  Rolling walker with 5" wheels;3in1 (PT)    Recommendations for Other Services   NA    Precautions / Restrictions Precautions Precautions: Fall Precaution Comments: h/o falls, decreased awareness of deficits and situation.  Restrictions Weight Bearing Restrictions: Yes LLE Weight Bearing: Weight bearing as tolerated      Mobility  Bed Mobility Overal bed mobility: Needs Assistance Bed Mobility: Supine to Sit     Supine to sit: Mod assist;HOB elevated     General bed mobility comments: Light mod assist to support trunk, hand over hand cues for using bed rail to help and some assist to weight shift hips to scoot to EOB.  Pt also needed help progressing his left, but not his right leg over to the side of the bed.   Transfers Overall transfer level: Needs assistance Equipment used: Rolling walker (2 wheeled) Transfers: Sit to/from Stand Sit to Stand: +2 physical assistance;Mod assist         General transfer comment: Two person mod assist to power up to standing.  Verbal cues for safe RW use and safe hand placement.   Ambulation/Gait Ambulation/Gait assistance: Mod assist;+2 physical  assistance Gait Distance (Feet): 5 Feet Assistive device: Rolling walker (2 wheeled) Gait Pattern/deviations: Step-to pattern;Antalgic;Trunk flexed     General Gait Details: Pt with flexed trunk posture (likely pre morbid) and able to take some moderately antalgic steps to the recliner chair wiht RW.  Two person assist to support trunk and manage lines/RW.          Balance Overall balance assessment: Needs assistance Sitting-balance support: Feet supported;Bilateral upper extremity supported Sitting balance-Leahy Scale: Fair Sitting balance - Comments: close supervision EOB.    Standing balance support: Bilateral upper extremity supported Standing balance-Leahy Scale: Poor Standing balance comment: needs support of RW and therapists.                              Pertinent Vitals/Pain Pain Assessment: Faces Faces Pain Scale: Hurts little more Pain Location: left leg and left head Pain Descriptors / Indicators: Grimacing;Guarding Pain Intervention(s): Limited activity within patient's tolerance;Monitored during session;Repositioned    Home Living Family/patient expects to be discharged to:: Assisted living([facility])                 Additional Comments: pt is a poor historian, family not present to report.  He looke comfortable with RW, not sure if he used one at baseline.     Prior Function           Comments: pt unable to accurately report     Hand Dominance   Dominant Hand: Right  Extremity/Trunk Assessment   Upper Extremity Assessment Upper Extremity Assessment: Defer to OT evaluation    Lower Extremity Assessment Lower Extremity Assessment: LLE deficits/detail LLE Deficits / Details: left leg with normal post op pain and weakness, ankle at least 3/5, knee 3-/5, hip 2+/5 per gross bed level assessment.     Cervical / Trunk Assessment Cervical / Trunk Assessment: Kyphotic  Communication   Communication: HOH(R ear is  better)  Cognition Arousal/Alertness: Awake/alert Behavior During Therapy: WFL for tasks assessed/performed Overall Cognitive Status: History of cognitive impairments - at baseline                                 General Comments: No family to report baseline, pt with STM deficits, pleasantly confused.              Assessment/Plan    PT Assessment Patient needs continued PT services  PT Problem List Decreased strength;Decreased range of motion;Decreased activity tolerance;Decreased balance;Decreased mobility;Decreased knowledge of use of DME;Decreased knowledge of precautions;Decreased safety awareness;Pain       PT Treatment Interventions DME instruction;Gait training;Stair training;Functional mobility training;Therapeutic exercise;Balance training;Therapeutic activities;Cognitive remediation;Patient/family education;Manual techniques;Modalities    PT Goals (Current goals can be found in the Care Plan section)  Acute Rehab PT Goals Patient Stated Goal: none stated PT Goal Formulation: With patient Time For Goal Achievement: 03/24/18 Potential to Achieve Goals: Good    Frequency Min 3X/week        Co-evaluation PT/OT/SLP Co-Evaluation/Treatment: Yes Reason for Co-Treatment: Complexity of the patient's impairments (multi-system involvement);Necessary to address cognition/behavior during functional activity;For patient/therapist safety;To address functional/ADL transfers PT goals addressed during session: Mobility/safety with mobility;Balance;Proper use of DME         AM-PAC PT "6 Clicks" Daily Activity  Outcome Measure Difficulty turning over in bed (including adjusting bedclothes, sheets and blankets)?: Unable Difficulty moving from lying on back to sitting on the side of the bed? : Unable Difficulty sitting down on and standing up from a chair with arms (e.g., wheelchair, bedside commode, etc,.)?: Unable Help needed moving to and from a bed to chair  (including a wheelchair)?: A Lot Help needed walking in hospital room?: A Lot Help needed climbing 3-5 steps with a railing? : Total 6 Click Score: 8    End of Session Equipment Utilized During Treatment: Gait belt Activity Tolerance: Patient tolerated treatment well Patient left: in chair;with call bell/phone within reach;with chair alarm set Nurse Communication: Mobility status PT Visit Diagnosis: Muscle weakness (generalized) (M62.81);Difficulty in walking, not elsewhere classified (R26.2);Pain Pain - Right/Left: Left Pain - part of body: Leg    Time: 1202-1220 PT Time Calculation (min) (ACUTE ONLY): 18 min   Charges:        Wells Guiles B. Jermesha Sottile, PT, DPT  Acute Rehabilitation [phone] pager #[phone] office   PT Evaluation $PT Eval Moderate Complexity: 1 Mod         03/10/2018, 12:36 PM

## 2018-03-10 NOTE — Progress Notes (Signed)
CT results: 1. Increased size of left convexity acute subdural hematoma, with continued acute bleeding since the prior scan. 2. Approximately 2 mm rightward midline shift. were called to Rondel Baton on call for neurosurgery at request of Chaney Malling, NP.  No new orders given at this time.  She states patient will continue to be monitored at this time.  Nursing staff to continue to monitor.

## 2018-03-10 NOTE — Evaluation (Signed)
Occupational Therapy Evaluation Patient Details Name: Steve Norris MRN: [id number] DOB: Oct 03, 1929 Today's Date: 03/10/2018    History of Present Illness 84 y.o. male admitted on 03/09/18 for fall with resultant SDH 85mm midline shift, L femur fx s/p nailing WBAT post op.  Pt with significant PMH of falls, dementia, sinus node dysfunction, PAF, erosive esophagitis, Barrett's esophagus, COPD, CAD, HOH (R ear is better).     Clinical Impression   Patient is s/p L IM nail LE  Surgery with SDH 2 mm midline shift resulting in functional limitations due to the deficits listed below (see OT problem list). Pt currently total +2 Mod (A) for transfer but initiates the task. Pt pleasant and requires incr time due to [facility]. Pt benefits from slow deep voice on R side.  Patient will benefit from skilled OT acutely to increase independence and safety with ADLS to allow discharge SNF.     Follow Up Recommendations  SNF    Equipment Recommendations  3 in 1 bedside commode;Wheelchair (measurements OT);Wheelchair cushion (measurements OT);Hospital bed    Recommendations for Other Services       Precautions / Restrictions Precautions Precautions: Fall Precaution Comments: h/o falls, decreased awareness of deficits and situation.  Restrictions Weight Bearing Restrictions: Yes LLE Weight Bearing: Weight bearing as tolerated      Mobility Bed Mobility Overal bed mobility: Needs Assistance Bed Mobility: Supine to Sit     Supine to sit: Mod assist;HOB elevated     General bed mobility comments: Light mod assist to support trunk, hand over hand cues for using bed rail to help and some assist to weight shift hips to scoot to EOB.  Pt also needed help progressing his left, but not his right leg over to the side of the bed.   Transfers Overall transfer level: Needs assistance Equipment used: Rolling walker (2 wheeled) Transfers: Sit to/from Stand Sit to Stand: +2 physical assistance;Mod assist          General transfer comment: Two person mod assist to power up to standing.  Verbal cues for safe RW use and safe hand placement.     Balance Overall balance assessment: Needs assistance Sitting-balance support: Feet supported;Bilateral upper extremity supported Sitting balance-Leahy Scale: Fair Sitting balance - Comments: close supervision EOB.    Standing balance support: Bilateral upper extremity supported Standing balance-Leahy Scale: Poor Standing balance comment: needs support of RW and therapists.                            ADL either performed or assessed with clinical judgement   ADL Overall ADL's : Needs assistance/impaired Eating/Feeding: Modified independent   Grooming: Modified independent   Upper Body Bathing: Minimal assistance   Lower Body Bathing: Moderate assistance   Upper Body Dressing : Minimal assistance   Lower Body Dressing: Moderate assistance   Toilet Transfer: +2 for physical assistance;Moderate assistance           Functional mobility during ADLs: +2 for physical assistance;Moderate assistance       Vision         Perception     Praxis      Pertinent Vitals/Pain Pain Assessment: Faces Faces Pain Scale: Hurts little more Pain Location: left leg and left head Pain Descriptors / Indicators: Grimacing;Guarding Pain Intervention(s): Limited activity within patient's tolerance;Monitored during session;Repositioned     Hand Dominance Right   Extremity/Trunk Assessment Upper Extremity Assessment Upper Extremity Assessment: Generalized weakness  Lower Extremity Assessment Lower Extremity Assessment: Defer to PT evaluation LLE Deficits / Details: left leg with normal post op pain and weakness, ankle at least 3/5, knee 3-/5, hip 2+/5 per gross bed level assessment.    Cervical / Trunk Assessment Cervical / Trunk Assessment: Kyphotic   Communication Communication Communication: HOH(R ear is better)   Cognition  Arousal/Alertness: Awake/alert Behavior During Therapy: WFL for tasks assessed/performed Overall Cognitive Status: History of cognitive impairments - at baseline                                 General Comments: No family to report baseline, pt with STM deficits, pleasantly confused.    General Comments       Exercises     Shoulder Instructions      Home Living Family/patient expects to be discharged to:: Assisted living([facility])                                 Additional Comments: pt is a poor historian, family not present to report.  He looke comfortable with RW, not sure if he used one at baseline.       Prior Functioning/Environment          Comments: pt unable to accurately report        OT Problem List: Decreased strength;Decreased range of motion;Decreased activity tolerance;Impaired balance (sitting and/or standing);Impaired vision/perception;Decreased coordination;Decreased cognition;Decreased safety awareness;Decreased knowledge of use of DME or AE;Decreased knowledge of precautions;Pain      OT Treatment/Interventions: Self-care/ADL training;Therapeutic exercise;Neuromuscular education;Energy conservation;DME and/or AE instruction;Modalities;Manual therapy;Therapeutic activities;Cognitive remediation/compensation;Patient/family education;Balance training;Visual/perceptual remediation/compensation    OT Goals(Current goals can be found in the care plan section) Acute Rehab OT Goals Patient Stated Goal: none stated OT Goal Formulation: Patient unable to participate in goal setting Time For Goal Achievement: 03/24/18 Potential to Achieve Goals: Good  OT Frequency: Min 2X/week   Barriers to D/C:            Co-evaluation PT/OT/SLP Co-Evaluation/Treatment: Yes Reason for Co-Treatment: Complexity of the patient's impairments (multi-system involvement);Necessary to address cognition/behavior during functional  activity;For patient/therapist safety;To address functional/ADL transfers PT goals addressed during session: Mobility/safety with mobility;Balance;Proper use of DME OT goals addressed during session: ADL's and self-care;Proper use of Adaptive equipment and DME;Strengthening/ROM      AM-PAC PT "6 Clicks" Daily Activity     Outcome Measure Help from another person eating meals?: None Help from another person taking care of personal grooming?: A Little Help from another person toileting, which includes using toliet, bedpan, or urinal?: A Lot Help from another person bathing (including washing, rinsing, drying)?: A Lot Help from another person to put on and taking off regular upper body clothing?: A Lot Help from another person to put on and taking off regular lower body clothing?: A Lot 6 Click Score: 15   End of Session Equipment Utilized During Treatment: Gait belt;Rolling walker Nurse Communication: Mobility status;Precautions  Activity Tolerance: Patient tolerated treatment well Patient left: in chair;with call bell/phone within reach;with chair alarm set  OT Visit Diagnosis: Unsteadiness on feet (R26.81)                Time: 8938-1017 OT Time Calculation (min): 20 min Charges:  OT General Charges $OT Visit: 1 Visit OT Evaluation $OT Eval Moderate Complexity: 1 Mod   Jeri Modena, OTR/L  Acute Rehabilitation  Services Pager: [phone] Office: [phone] .   Jeri Modena 03/10/2018, 3:59 PM

## 2018-03-10 NOTE — NC FL2 (Signed)
[place] LEVEL OF CARE SCREENING TOOL     IDENTIFICATION  Patient Name: Steve Norris Birthdate: 1929/09/03 Sex: male Admission Date (Current Location): 03/09/2018  [facility] and [place] Number:  Herbalist and Address:  [facility], [place] [address] [place], [place] [address]      Provider Number: [id number]  Attending Physician Name and Address:  Kinnie Feil, MD  Relative Name and Phone Number:  Brailen Macneal; son; [phone]    Current Level of Care: Hospital Recommended Level of Care: [place] Prior Approval Number:    Date Approved/Denied:   PASRR Number: [id number]  Discharge Plan: SNF    Current Diagnoses: Patient Active Problem List   Diagnosis Date Noted  . SDH (subdural hematoma) ([place]) 03/09/2018  . Lt Hip fracture requiring operative repair ([place]) 03/09/2018  . Fall at nursing home 03/09/2018  . Fall at [facility], initial encounter 03/09/2018  . Iron deficiency anemia 01/25/2018  . Palliative care encounter 08/24/2017  . Subdural hematoma ([place]) 05/08/2017  . New onset seizure ([place]) 12/23/2016  . Acute left otitis media 12/23/2016  . Acute encephalopathy 12/23/2016  . PAF (paroxysmal atrial fibrillation) ([place]) 12/23/2016  . Benign essential HTN 12/23/2016  . Chronic diastolic heart failure, NYHA class 2 ([place]) 12/23/2016  . Hyperlipidemia 12/23/2016  . Dementia ([place]) 12/23/2016  . Asthma 12/23/2016  . Elevated AST (SGOT) 12/23/2016  . Acute diastolic CHF (congestive heart failure) ([place])   . Pressure injury of skin 08/09/2016  . BPH (benign prostatic hyperplasia) 08/09/2016  . HTN (hypertension) 08/09/2016  . CHF exacerbation ([place]) 08/08/2016  . Anemia 03/25/2016  . SSS (sick sinus syndrome) ([place]) 03/25/2016  . CAP (community acquired pneumonia)   . Atrial fibrillation with rapid ventricular response ([place]) 11/04/2014  . Dyspnea 12/15/2012  . Long term current use of anticoagulant therapy  07/14/2012  . COPD (chronic obstructive pulmonary disease) ([place]) 02/23/2012  . CAD (coronary artery disease) 02/23/2012  . S/P placement of cardiac pacemaker 02/23/2012  . Atrial fibrillation ([place]) 02/23/2012  . Barrett's esophagus 04/30/2009    Orientation RESPIRATION BLADDER Height & Weight     Self  Normal Continent Weight: 150 lb (68 kg) Height:  5\' 10"  (177.8 cm)  BEHAVIORAL SYMPTOMS/MOOD NEUROLOGICAL BOWEL NUTRITION STATUS      Continent Diet  AMBULATORY STATUS COMMUNICATION OF NEEDS Skin   Extensive Assist Verbally Surgical wounds, Skin abrasions(surgical incision on left hip; abrasion on head with gauze dressing)                       Personal Care Assistance Level of Assistance  Bathing, Feeding, Dressing Bathing Assistance: Maximum assistance Feeding assistance: Limited assistance Dressing Assistance: Maximum assistance     Functional Limitations Info  Sight, Hearing, Speech Sight Info: Adequate Hearing Info: Impaired Speech Info: Adequate    SPECIAL CARE FACTORS FREQUENCY  PT (By licensed PT), OT (By licensed OT)     PT Frequency: 5x week OT Frequency: 5x week            Contractures Contractures Info: Not present    Additional Factors Info  Code Status, Allergies, Psychotropic Code Status Info: Full Code Allergies Info: AMIODARONE, BACTRIM SULFAMETHOXAZOLE-TRIMETHOPRIM, PENICILLINS  Psychotropic Info: mirtazapine (REMERON) tablet 7.5 mg daily at bedtime         Current Medications (03/10/2018):  This is the current hospital active medication list Current Facility-Administered Medications  Medication Dose Route Frequency Provider Last Rate Last Dose  .  0.9 %  sodium chloride infusion  250 mL Intravenous PRN Prudencio Burly III, PA-C 10 mL/hr at 03/09/18 1730 250 mL at 03/09/18 1730  . acetaminophen (TYLENOL) tablet 500 mg  500 mg Oral Q6H PRN Prudencio Burly III, PA-C      . albuterol (PROVENTIL) (2.5 MG/3ML) 0.083% nebulizer  solution 2.5 mg  2.5 mg Nebulization Q2H PRN Prudencio Burly III, PA-C      . divalproex (DEPAKOTE) DR tablet 250 mg  250 mg Oral BID Prudencio Burly III, PA-C   250 mg at 03/10/18 1610  . feeding supplement (ENSURE ENLIVE) (ENSURE ENLIVE) liquid 237 mL  1 Bottle Oral BID BM Prudencio Burly III, PA-C   237 mL at 03/10/18 1351  . ferrous sulfate tablet 325 mg  325 mg Oral BID WC Prudencio Burly III, PA-C   325 mg at 03/10/18 9604  . finasteride (PROSCAR) tablet 5 mg  5 mg Oral Daily Prudencio Burly III, PA-C   5 mg at 03/10/18 5409  . HYDROcodone-acetaminophen (NORCO/VICODIN) 5-325 MG per tablet 1 tablet  1 tablet Oral Q4H PRN Prudencio Burly III, PA-C   1 tablet at 03/10/18 1521  . lactulose (CHRONULAC) 10 GM/15ML solution 20 g  20 g Oral Daily Prudencio Burly III, PA-C   20 g at 03/10/18 8119  . LORazepam (ATIVAN) injection 0.5 mg  0.5 mg Intravenous Q6H PRN Emokpae, Courage, MD      . mirtazapine (REMERON) tablet 7.5 mg  7.5 mg Oral QHS Prudencio Burly III, PA-C   7.5 mg at 03/09/18 2121  . morphine 2 MG/ML injection 2 mg  2 mg Intravenous Q4H PRN Prudencio Burly III, PA-C   2 mg at 03/09/18 0843  . nitroGLYCERIN (NITROSTAT) SL tablet 0.4 mg  0.4 mg Sublingual Q5 min PRN Prudencio Burly III, PA-C      . olopatadine (PATANOL) 0.1 % ophthalmic solution 1 drop  1 drop Both Eyes BID Prudencio Burly III, PA-C   1 drop at 03/10/18 0834  . ondansetron (ZOFRAN) tablet 4 mg  4 mg Oral Q6H PRN Prudencio Burly III, PA-C       Or  . ondansetron [facility]) injection 4 mg  4 mg Intravenous Q6H PRN Prudencio Burly III, PA-C      . polyethylene glycol (MIRALAX / GLYCOLAX) packet 17 g  17 g Oral Daily PRN Prudencio Burly III, PA-C      . rosuvastatin (CRESTOR) tablet 10 mg  10 mg Oral Daily Prudencio Burly III, PA-C   10 mg at 03/10/18 1478  . sodium chloride flush (NS) 0.9 % injection 3 mL  3 mL  Intravenous Q12H Prudencio Burly III, PA-C   3 mL at 03/10/18 0834  . sodium chloride flush (NS) 0.9 % injection 3 mL  3 mL Intravenous PRN Prudencio Burly III, PA-C      . sotalol (BETAPACE) tablet 80 mg  80 mg Oral Q12H Prudencio Burly III, PA-C   80 mg at 03/10/18 2956  . tamsulosin (FLOMAX) capsule 0.4 mg  0.4 mg Oral Daily Prudencio Burly III, PA-C   0.4 mg at 03/10/18 2130  . traZODone (DESYREL) tablet 50 mg  50 mg Oral QHS PRN Prudencio Burly III, PA-C   50 mg at 03/09/18 2121     Discharge Medications: Please see discharge summary for a list of discharge medications.  Relevant Imaging Results:  Relevant Lab  Results:   Additional Information SS#[id number] [place], [place]

## 2018-03-10 NOTE — Op Note (Signed)
03/09/2018  11:11 AM  PATIENT:  Steve Norris    PRE-OPERATIVE DIAGNOSIS:  closed fracture of neck of left femur  POST-OPERATIVE DIAGNOSIS:  Same  PROCEDURE:  CANNULATED HIP PINNING  SURGEON:  Renette Butters, MD  ASSISTANT: Roxan Hockey, PA-C, he was present and scrubbed throughout the case, critical for completion in a timely fashion, and for retraction, instrumentation, and closure.   ANESTHESIA:   General  PREOPERATIVE INDICATIONS:  Steve Norris is a  84 y.o. male who fell and was found to have a diagnosis of closed fracture of neck of left femur who elected for surgical management.    The risks benefits and alternatives were discussed with the patient preoperatively including but not limited to the risks of infection, bleeding, nerve injury, cardiopulmonary complications, blood clots, malunion, nonunion, avascular necrosis, the need for revision surgery, the potential for conversion to hemiarthroplasty, among others, and the patient was willing to proceed.  OPERATIVE IMPLANTS: 6.5 mm cannulated screws x3  OPERATIVE FINDINGS: Clinical osteoporosis with weak bone, proximal femur  OPERATIVE PROCEDURE: The patient was brought to the operating room and placed in supine position. IV antibiotics were given. General anesthesia administered. Foley was also given. The patient was placed on the fracture table. The operative extremity was positioned, without any significant reduction maneuver and was prepped and draped in usual sterile fashion.  Time out was performed.  Small incisions were made distal to the greater trochanter, and 3 guidewires were introduced Into an inverted triangle configuration. The lengths were measured. The reduction was slightly valgus, and near-anatomic. I opened the cortex with a cannulated drill, and then placed the screws into position. Satisfactory fixation was achieved. I sequentially tightened the screws by hand.  I performed a live fluoroscopic exam and  no screw penetrance was noted. All threads crossed the fracture site.   The wounds were irrigated copiously, and repaired with Vicryl with Steri-Strips and sterile gauze. There no complications and the patient tolerated the procedure well.  The patient will be weightbearing as tolerated, VTE prophylaxis will be: determined by primary and neurosurgery given comorbidities

## 2018-03-10 NOTE — Social Work (Signed)
CSW acknowledging consult for SNF placement. Pt from [facility]. Will follow for therapy recommendations.   Alexander Mt, [place] Work [phone]

## 2018-03-10 NOTE — Progress Notes (Signed)
[facility] PROGRESS NOTE  [name] PRF:[id number] DOB: [birth year] DOA: 03/09/2018 PCP: Deland Pretty, MD  Brief summary   Steve Norris  is a 84 y.o. male with past medical history relevant for paroxysmal A. fib chronically on Eliquis for anticoagulation, hypertension, dyslipidemia, history of sick sinus syndrome status post prior pacemaker placement, history of GERD, COPD dementia chronic anemia who presented on 03/09/2018 to the ED by EMS after being found on the floor at the skilled nursing facility after presumably falling and striking the left side of his head...... No evidence of trauma or bleeding, patient also had left hip pain. Patient received 1 dose of Andexxa to reverse his anticoagulation In ED.. CTA shows small subdural hemorrhage, Dr. Arnoldo Morale from neurosurgery was contacted.  Hip x-ray showed left hip fracture, orthopedic team was consulted   Assessment/Plan:  SDH. status post fall with acute subdural hematoma--- there is about 84mm of rightward midline shift, neurosurgical consult from Dr. Arnoldo Morale appreciated advised expectant management with repeat CT head -pend. Exam is non focal.    Lt Femoral Neck Fx-- s/p ORIF on 03/09/18 , further treatment per orthopedic team  Lt superior pubic ramus fracture--- status post fall with left hip and Lt  pubic ramus fracture,  conservative management, orthopedic input and physical therapy input appreciated  PAFib--- continue sotalol 80 mg twice daily for rate control,, Eliquis on hold due to subdural hematoma, patient received 1 dose of Andexxa to reverse his anticoagulation. HR is stable   Dementia/Depression/Anorexia--stable at this time, patient is cooperative, continue Depakote 250 twice daily, trazodone as needed sleep continue Remeron 7.5 mg nightly  BPH with LUTs--okay to continue Proscar and Flomax  COPD. Stable, no s/s of acute exacerbation. Cont bronchodilators prn   Code Status: full Family Communication: d/w  patient, RN (indicate person spoken with, relationship, and if by phone, the number) Disposition Plan: needs PT. Likely SNF   Consultants:  Neurosurgery    Orthopedics   Procedures:  Left hip pinning   Antibiotics: Anti-infectives (From admission, onward)   Start     Dose/Rate Route Frequency Ordered Stop   03/10/18 0600  clindamycin (CLEOCIN) IVPB 900 mg     900 mg 100 mL/hr over 30 Minutes Intravenous On call to O.R. 03/09/18 1223 03/09/18 1453   03/09/18 1225  clindamycin (CLEOCIN) 900 MG/50ML IVPB    Note to Pharmacy:  Henrine Screws   : cabinet override      03/09/18 1225 03/09/18 1438        (indicate start date, and stop date if known)  HPI/Subjective: Reports feeling well. No acute focal weakness or paresthesias. No headaches.   Objective: Vitals:   03/10/18 0847 03/10/18 1142  BP: 112/68 (!) 98/54  Pulse: 80 (!) 59  Resp: 12 10  Temp: (!) 97.5 F (36.4 C) (!) 97.5 F (36.4 C)  SpO2: 100% 99%    Intake/Output Summary (Last 24 hours) at 03/10/2018 1235 Last data filed at 03/10/2018 0300 Gross per 24 hour  Intake 1430 ml  Output -  Net 1430 ml   Filed Weights   03/09/18 0221  Weight: 68 kg    Exam:   General:  No distress   Cardiovascular: s1,s2 rrr  Respiratory: CTA BL  Abdomen: soft, nt   Musculoskeletal: no leg edema    Data Reviewed: Basic Metabolic Panel: Recent Labs  Lab 03/09/18 0447 03/10/18 0250  NA 132* 133*  K 4.1 4.7  CL 102 104  CO2 24 26  GLUCOSE 100* 150*  BUN 14 18  CREATININE 0.62 0.66  CALCIUM 8.1* 7.9*   Liver Function Tests: No results for input(s): AST, ALT, ALKPHOS, BILITOT, PROT, ALBUMIN in the last 168 hours. No results for input(s): LIPASE, AMYLASE in the last 168 hours. No results for input(s): AMMONIA in the last 168 hours. CBC: Recent Labs  Lab 03/09/18 0447 03/10/18 0250  WBC 8.7 4.4  HGB 9.3* 8.1*  HCT 30.9* 25.9*  MCV 101.6* 100.4*  PLT 225 172   Cardiac Enzymes: No results for  input(s): CKTOTAL, CKMB, CKMBINDEX, TROPONINI in the last 168 hours. BNP (last 3 results) No results for input(s): BNP in the last 8760 hours.  ProBNP (last 3 results) No results for input(s): PROBNP in the last 8760 hours.  CBG: No results for input(s): GLUCAP in the last 168 hours.  No results found for this or any previous visit (from the past 240 hour(s)).   Studies: Ct Head Wo Contrast  Result Date: 03/09/2018 CLINICAL DATA:  Status post unwitnessed fall. Patient found down. Acute onset of left-sided head pain. Concern for cervical spine injury. Initial encounter. EXAM: CT HEAD WITHOUT CONTRAST CT CERVICAL SPINE WITHOUT CONTRAST TECHNIQUE: Multidetector CT imaging of the head and cervical spine was performed following the standard protocol without intravenous contrast. Multiplanar CT image reconstructions of the cervical spine were also generated. COMPARISON:  CT of the head performed 05/09/2017, and CT of the cervical spine performed 12/26/2016 FINDINGS: CT HEAD FINDINGS Brain: There is acute subdural hematoma tracking along the posterior left parietal lobe, measuring up to 1.1 cm in thickness, with mild associated mass effect. There is approximately 2 mm of rightward midline shift at this time. No evidence of acute infarction or hydrocephalus. Mild periventricular white matter change likely reflects small vessel ischemic microangiopathy. Mild cerebellar atrophy is noted. The brainstem and fourth ventricle are within normal limits. The third and lateral ventricles, and basal ganglia are unremarkable in appearance. Vascular: No hyperdense vessel or unexpected calcification. Skull: There is no evidence of fracture; visualized osseous structures are unremarkable in appearance. Sinuses/Orbits: The orbits are within normal limits. There is complete opacification of the mastoid air cells bilaterally. The paranasal sinuses are well-aerated. Other: Soft tissue swelling is noted lateral to the left  orbit and overlying the left frontal calvarium. CT CERVICAL SPINE FINDINGS Alignment: Normal. Skull base and vertebrae: No acute fracture. No primary bone lesion or focal pathologic process. Soft tissues and spinal canal: No prevertebral fluid or swelling. No visible canal hematoma. Disc levels: There is mild intervertebral disc space narrowing at C5-C6, with small anterior and posterior disc osteophyte complexes. A posterior disc osteophyte complex is noted at C6-C7, with narrowing of the spinal canal to 8 mm at this level. Facet disease is noted along the cervical spine. Degenerative change is noted about the dens. Upper chest: Emphysema is noted at the lung apices. The visualized portions of the thyroid gland are unremarkable. Mild calcification is noted at the carotid bifurcations bilaterally. Other: No additional soft tissue abnormalities are seen. IMPRESSION: 1. Acute subdural hematoma tracking along the posterior left parietal lobe, measuring up to 1.1 cm in thickness, with mild associated mass effect. Approximately 2 mm of rightward midline shift at this time. 2. No evidence of fracture or subluxation along the cervical spine. 3. Soft tissue swelling lateral to the left orbit and overlying the left frontal calvarium. 4. Mild small vessel ischemic microangiopathy. 5. Mild degenerative change at the lower cervical spine. Narrowing of the spinal canal to 8 mm at  C6-C7, due to a posterior disc osteophyte complex. 6. Complete opacification of the mastoid air cells bilaterally. 7. Emphysema at the lung apices. 8. Mild calcification at the carotid bifurcations bilaterally. Carotid ultrasound could be considered for further evaluation, when and as deemed clinically appropriate. Emphysema (ICD10-J43.9). Critical Value/emergent results were called by telephone at the time of interpretation on 03/09/2018 at 3:16 am to Dr. Joseph Berkshire, who verbally acknowledged these results. Electronically Signed   By: Garald Balding M.D.   On: 03/09/2018 03:18   Ct Cervical Spine Wo Contrast  Result Date: 03/09/2018 CLINICAL DATA:  Status post unwitnessed fall. Patient found down. Acute onset of left-sided head pain. Concern for cervical spine injury. Initial encounter. EXAM: CT HEAD WITHOUT CONTRAST CT CERVICAL SPINE WITHOUT CONTRAST TECHNIQUE: Multidetector CT imaging of the head and cervical spine was performed following the standard protocol without intravenous contrast. Multiplanar CT image reconstructions of the cervical spine were also generated. COMPARISON:  CT of the head performed 05/09/2017, and CT of the cervical spine performed 12/26/2016 FINDINGS: CT HEAD FINDINGS Brain: There is acute subdural hematoma tracking along the posterior left parietal lobe, measuring up to 1.1 cm in thickness, with mild associated mass effect. There is approximately 2 mm of rightward midline shift at this time. No evidence of acute infarction or hydrocephalus. Mild periventricular white matter change likely reflects small vessel ischemic microangiopathy. Mild cerebellar atrophy is noted. The brainstem and fourth ventricle are within normal limits. The third and lateral ventricles, and basal ganglia are unremarkable in appearance. Vascular: No hyperdense vessel or unexpected calcification. Skull: There is no evidence of fracture; visualized osseous structures are unremarkable in appearance. Sinuses/Orbits: The orbits are within normal limits. There is complete opacification of the mastoid air cells bilaterally. The paranasal sinuses are well-aerated. Other: Soft tissue swelling is noted lateral to the left orbit and overlying the left frontal calvarium. CT CERVICAL SPINE FINDINGS Alignment: Normal. Skull base and vertebrae: No acute fracture. No primary bone lesion or focal pathologic process. Soft tissues and spinal canal: No prevertebral fluid or swelling. No visible canal hematoma. Disc levels: There is mild intervertebral disc space  narrowing at C5-C6, with small anterior and posterior disc osteophyte complexes. A posterior disc osteophyte complex is noted at C6-C7, with narrowing of the spinal canal to 8 mm at this level. Facet disease is noted along the cervical spine. Degenerative change is noted about the dens. Upper chest: Emphysema is noted at the lung apices. The visualized portions of the thyroid gland are unremarkable. Mild calcification is noted at the carotid bifurcations bilaterally. Other: No additional soft tissue abnormalities are seen. IMPRESSION: 1. Acute subdural hematoma tracking along the posterior left parietal lobe, measuring up to 1.1 cm in thickness, with mild associated mass effect. Approximately 2 mm of rightward midline shift at this time. 2. No evidence of fracture or subluxation along the cervical spine. 3. Soft tissue swelling lateral to the left orbit and overlying the left frontal calvarium. 4. Mild small vessel ischemic microangiopathy. 5. Mild degenerative change at the lower cervical spine. Narrowing of the spinal canal to 8 mm at C6-C7, due to a posterior disc osteophyte complex. 6. Complete opacification of the mastoid air cells bilaterally. 7. Emphysema at the lung apices. 8. Mild calcification at the carotid bifurcations bilaterally. Carotid ultrasound could be considered for further evaluation, when and as deemed clinically appropriate. Emphysema (ICD10-J43.9). Critical Value/emergent results were called by telephone at the time of interpretation on 03/09/2018 at 3:16 am to Dr. Harrell Gave  POLLINA, who verbally acknowledged these results. Electronically Signed   By: Garald Balding M.D.   On: 03/09/2018 03:18   Dg C-arm 1-60 Min  Result Date: 03/09/2018 CLINICAL DATA:  Thinning of the left hip EXAM: DG C-ARM 61-120 MIN COMPARISON:  Pelvis on left hip films of 03/09/2018 FINDINGS: Three nails are present for fixation of the left subcapital femoral fracture. No complicating features are seen.  Fluoroscopy time of 30 seconds was recorded. IMPRESSION: C-arm fluoroscopy provided. Electronically Signed   By: Ivar Drape M.D.   On: 03/09/2018 15:08   Dg Hip Operative Unilat W Or W/o Pelvis Left  Result Date: 03/09/2018 CLINICAL DATA:  Pinning of the left hip EXAM: OPERATIVE left HIP (WITH PELVIS IF PERFORMED) 2 VIEWS TECHNIQUE: Fluoroscopic spot image(s) were submitted for interpretation post-operatively. COMPARISON:  Pelvis on left hip films of 03/09/2018 FINDINGS: Three nails are present for fixation of the left subcapital femoral fracture. No complicating features are seen. IMPRESSION: Nails for fixation of left subcapital femoral fracture. Electronically Signed   By: Ivar Drape M.D.   On: 03/09/2018 15:07   Dg Hip Unilat W Or Wo Pelvis 2-3 Views Left  Result Date: 03/09/2018 CLINICAL DATA:  Pain after fall. EXAM: DG HIP (WITH OR WITHOUT PELVIS) 2-3V LEFT COMPARISON:  None. FINDINGS: There is a fracture through the left femoral neck and another through the left superior pubic ramus. IMPRESSION: Fractures through the left femoral neck and the left superior pubic ramus. Electronically Signed   By: Dorise Bullion III M.D   On: 03/09/2018 02:51    Scheduled Meds: . divalproex  250 mg Oral BID  . feeding supplement (ENSURE ENLIVE)  1 Bottle Oral BID BM  . ferrous sulfate  325 mg Oral BID WC  . finasteride  5 mg Oral Daily  . lactulose  20 g Oral Daily  . mirtazapine  7.5 mg Oral QHS  . olopatadine  1 drop Both Eyes BID  . rosuvastatin  10 mg Oral Daily  . sodium chloride flush  3 mL Intravenous Q12H  . sotalol  80 mg Oral Q12H  . tamsulosin  0.4 mg Oral Daily   Continuous Infusions: . sodium chloride 250 mL (03/09/18 1730)  . lactated ringers      Principal Problem:   SDH (subdural hematoma) (HCC) Active Problems:   COPD (chronic obstructive pulmonary disease) (HCC)   S/P placement of cardiac pacemaker   Atrial fibrillation ([place])   Long term current use of anticoagulant  therapy   SSS (sick sinus syndrome) ([place])   Benign essential HTN   Lt Hip fracture requiring operative repair [facility])   Fall at nursing home   Fall at [facility], initial encounter    Time spent: >35 minutes     Kinnie Feil  [facility] Pager [phone]. If 7PM-7AM, please contact night-coverage at [email or web address], password [facility] 03/10/2018, 12:35 PM  LOS: 1 day

## 2018-03-10 NOTE — Progress Notes (Signed)
Subjective: The patient is alert and pleasant.  His son is at the bedside.  He looks and feels better.  Objective: Vital signs in last 24 hours: Temp:  [97.5 F (36.4 C)-97.7 F (36.5 C)] 97.5 F (36.4 C) (11/13 1142) Pulse Rate:  [59-82] 59 (11/13 1142) Resp:  [8-15] 10 (11/13 1142) BP: (98-122)/(48-69) 98/54 (11/13 1142) SpO2:  [91 %-100 %] 99 % (11/13 1142) Estimated body mass index is 21.52 kg/m as calculated from the following:   Height as of this encounter: 5\' 10"  (1.778 m).   Weight as of this encounter: 68 kg.   Intake/Output from previous day: 11/12 0701 - 11/13 0700 In: 1430 [P.O.:240; I.V.:940; IV Piggyback:250] Out: -  Intake/Output this shift: No intake/output data recorded.  Physical exam patient is alert and oriented.  He is moving all 4 extremities well.  Lab Results: Recent Labs    03/09/18 0447 03/10/18 0250  WBC 8.7 4.4  HGB 9.3* 8.1*  HCT 30.9* 25.9*  PLT 225 172   BMET Recent Labs    03/09/18 0447 03/10/18 0250  NA 132* 133*  K 4.1 4.7  CL 102 104  CO2 24 26  GLUCOSE 100* 150*  BUN 14 18  CREATININE 0.62 0.66  CALCIUM 8.1* 7.9*    Studies/Results: Ct Head Wo Contrast  Result Date: 03/09/2018 CLINICAL DATA:  Status post unwitnessed fall. Patient found down. Acute onset of left-sided head pain. Concern for cervical spine injury. Initial encounter. EXAM: CT HEAD WITHOUT CONTRAST CT CERVICAL SPINE WITHOUT CONTRAST TECHNIQUE: Multidetector CT imaging of the head and cervical spine was performed following the standard protocol without intravenous contrast. Multiplanar CT image reconstructions of the cervical spine were also generated. COMPARISON:  CT of the head performed 05/09/2017, and CT of the cervical spine performed 12/26/2016 FINDINGS: CT HEAD FINDINGS Brain: There is acute subdural hematoma tracking along the posterior left parietal lobe, measuring up to 1.1 cm in thickness, with mild associated mass effect. There is approximately 2 mm  of rightward midline shift at this time. No evidence of acute infarction or hydrocephalus. Mild periventricular white matter change likely reflects small vessel ischemic microangiopathy. Mild cerebellar atrophy is noted. The brainstem and fourth ventricle are within normal limits. The third and lateral ventricles, and basal ganglia are unremarkable in appearance. Vascular: No hyperdense vessel or unexpected calcification. Skull: There is no evidence of fracture; visualized osseous structures are unremarkable in appearance. Sinuses/Orbits: The orbits are within normal limits. There is complete opacification of the mastoid air cells bilaterally. The paranasal sinuses are well-aerated. Other: Soft tissue swelling is noted lateral to the left orbit and overlying the left frontal calvarium. CT CERVICAL SPINE FINDINGS Alignment: Normal. Skull base and vertebrae: No acute fracture. No primary bone lesion or focal pathologic process. Soft tissues and spinal canal: No prevertebral fluid or swelling. No visible canal hematoma. Disc levels: There is mild intervertebral disc space narrowing at C5-C6, with small anterior and posterior disc osteophyte complexes. A posterior disc osteophyte complex is noted at C6-C7, with narrowing of the spinal canal to 8 mm at this level. Facet disease is noted along the cervical spine. Degenerative change is noted about the dens. Upper chest: Emphysema is noted at the lung apices. The visualized portions of the thyroid gland are unremarkable. Mild calcification is noted at the carotid bifurcations bilaterally. Other: No additional soft tissue abnormalities are seen. IMPRESSION: 1. Acute subdural hematoma tracking along the posterior left parietal lobe, measuring up to 1.1 cm in thickness, with mild  associated mass effect. Approximately 2 mm of rightward midline shift at this time. 2. No evidence of fracture or subluxation along the cervical spine. 3. Soft tissue swelling lateral to the left  orbit and overlying the left frontal calvarium. 4. Mild small vessel ischemic microangiopathy. 5. Mild degenerative change at the lower cervical spine. Narrowing of the spinal canal to 8 mm at C6-C7, due to a posterior disc osteophyte complex. 6. Complete opacification of the mastoid air cells bilaterally. 7. Emphysema at the lung apices. 8. Mild calcification at the carotid bifurcations bilaterally. Carotid ultrasound could be considered for further evaluation, when and as deemed clinically appropriate. Emphysema (ICD10-J43.9). Critical Value/emergent results were called by telephone at the time of interpretation on 03/09/2018 at 3:16 am to Dr. Joseph Berkshire, who verbally acknowledged these results. Electronically Signed   By: Garald Balding M.D.   On: 03/09/2018 03:18   Ct Cervical Spine Wo Contrast  Result Date: 03/09/2018 CLINICAL DATA:  Status post unwitnessed fall. Patient found down. Acute onset of left-sided head pain. Concern for cervical spine injury. Initial encounter. EXAM: CT HEAD WITHOUT CONTRAST CT CERVICAL SPINE WITHOUT CONTRAST TECHNIQUE: Multidetector CT imaging of the head and cervical spine was performed following the standard protocol without intravenous contrast. Multiplanar CT image reconstructions of the cervical spine were also generated. COMPARISON:  CT of the head performed 05/09/2017, and CT of the cervical spine performed 12/26/2016 FINDINGS: CT HEAD FINDINGS Brain: There is acute subdural hematoma tracking along the posterior left parietal lobe, measuring up to 1.1 cm in thickness, with mild associated mass effect. There is approximately 2 mm of rightward midline shift at this time. No evidence of acute infarction or hydrocephalus. Mild periventricular white matter change likely reflects small vessel ischemic microangiopathy. Mild cerebellar atrophy is noted. The brainstem and fourth ventricle are within normal limits. The third and lateral ventricles, and basal ganglia are  unremarkable in appearance. Vascular: No hyperdense vessel or unexpected calcification. Skull: There is no evidence of fracture; visualized osseous structures are unremarkable in appearance. Sinuses/Orbits: The orbits are within normal limits. There is complete opacification of the mastoid air cells bilaterally. The paranasal sinuses are well-aerated. Other: Soft tissue swelling is noted lateral to the left orbit and overlying the left frontal calvarium. CT CERVICAL SPINE FINDINGS Alignment: Normal. Skull base and vertebrae: No acute fracture. No primary bone lesion or focal pathologic process. Soft tissues and spinal canal: No prevertebral fluid or swelling. No visible canal hematoma. Disc levels: There is mild intervertebral disc space narrowing at C5-C6, with small anterior and posterior disc osteophyte complexes. A posterior disc osteophyte complex is noted at C6-C7, with narrowing of the spinal canal to 8 mm at this level. Facet disease is noted along the cervical spine. Degenerative change is noted about the dens. Upper chest: Emphysema is noted at the lung apices. The visualized portions of the thyroid gland are unremarkable. Mild calcification is noted at the carotid bifurcations bilaterally. Other: No additional soft tissue abnormalities are seen. IMPRESSION: 1. Acute subdural hematoma tracking along the posterior left parietal lobe, measuring up to 1.1 cm in thickness, with mild associated mass effect. Approximately 2 mm of rightward midline shift at this time. 2. No evidence of fracture or subluxation along the cervical spine. 3. Soft tissue swelling lateral to the left orbit and overlying the left frontal calvarium. 4. Mild small vessel ischemic microangiopathy. 5. Mild degenerative change at the lower cervical spine. Narrowing of the spinal canal to 8 mm at C6-C7, due to a  posterior disc osteophyte complex. 6. Complete opacification of the mastoid air cells bilaterally. 7. Emphysema at the lung apices.  8. Mild calcification at the carotid bifurcations bilaterally. Carotid ultrasound could be considered for further evaluation, when and as deemed clinically appropriate. Emphysema (ICD10-J43.9). Critical Value/emergent results were called by telephone at the time of interpretation on 03/09/2018 at 3:16 am to Dr. Joseph Berkshire, who verbally acknowledged these results. Electronically Signed   By: Garald Balding M.D.   On: 03/09/2018 03:18   Dg C-arm 1-60 Min  Result Date: 03/09/2018 CLINICAL DATA:  Thinning of the left hip EXAM: DG C-ARM 61-120 MIN COMPARISON:  Pelvis on left hip films of 03/09/2018 FINDINGS: Three nails are present for fixation of the left subcapital femoral fracture. No complicating features are seen. Fluoroscopy time of 30 seconds was recorded. IMPRESSION: C-arm fluoroscopy provided. Electronically Signed   By: Ivar Drape M.D.   On: 03/09/2018 15:08   Dg Hip Operative Unilat W Or W/o Pelvis Left  Result Date: 03/09/2018 CLINICAL DATA:  Pinning of the left hip EXAM: OPERATIVE left HIP (WITH PELVIS IF PERFORMED) 2 VIEWS TECHNIQUE: Fluoroscopic spot image(s) were submitted for interpretation post-operatively. COMPARISON:  Pelvis on left hip films of 03/09/2018 FINDINGS: Three nails are present for fixation of the left subcapital femoral fracture. No complicating features are seen. IMPRESSION: Nails for fixation of left subcapital femoral fracture. Electronically Signed   By: Ivar Drape M.D.   On: 03/09/2018 15:07   Dg Hip Unilat W Or Wo Pelvis 2-3 Views Left  Result Date: 03/09/2018 CLINICAL DATA:  Pain after fall. EXAM: DG HIP (WITH OR WITHOUT PELVIS) 2-3V LEFT COMPARISON:  None. FINDINGS: There is a fracture through the left femoral neck and another through the left superior pubic ramus. IMPRESSION: Fractures through the left femoral neck and the left superior pubic ramus. Electronically Signed   By: Dorise Bullion III M.D   On: 03/09/2018 02:51    Assessment/Plan: Left  subdural hematoma: The patient is doing well clinically.  We are awaiting his follow-up head CT.  He can be mobilized from my point of view.  LOS: 1 day     Steve Norris 03/10/2018, 3:36 PM

## 2018-03-11 DIAGNOSIS — Z7901 Long term (current) use of anticoagulants: Secondary | ICD-10-CM

## 2018-03-11 MED ORDER — SENNOSIDES-DOCUSATE SODIUM 8.6-50 MG PO TABS
1.0000 | ORAL_TABLET | Freq: Two times a day (BID) | ORAL | Status: DC
  Administered 2018-03-11 – 2018-03-12 (×2): 1 via ORAL
  Filled 2018-03-11 (×2): qty 1

## 2018-03-11 NOTE — Plan of Care (Signed)
  Problem: Education: Goal: Knowledge of General Education information will improve Description: Including pain rating scale, medication(s)/side effects and non-pharmacologic comfort measures Outcome: Progressing   Problem: Health Behavior/Discharge Planning: Goal: Ability to manage health-related needs will improve Outcome: Progressing   Problem: Clinical Measurements: Goal: Ability to maintain clinical measurements within normal limits will improve Outcome: Progressing Goal: Will remain free from infection Outcome: Progressing Goal: Diagnostic test results will improve Outcome: Progressing Goal: Respiratory complications will improve Outcome: Progressing Goal: Cardiovascular complication will be avoided Outcome: Progressing   Problem: Activity: Goal: Risk for activity intolerance will decrease Outcome: Progressing   Problem: Nutrition: Goal: Adequate nutrition will be maintained Outcome: Progressing   Problem: Elimination: Goal: Will not experience complications related to bowel motility Outcome: Progressing Goal: Will not experience complications related to urinary retention Outcome: Progressing   Problem: Pain Managment: Goal: General experience of comfort will improve Outcome: Progressing   Problem: Safety: Goal: Ability to remain free from injury will improve Outcome: Progressing   Problem: Skin Integrity: Goal: Risk for impaired skin integrity will decrease Outcome: Progressing   Problem: Education: Goal: Verbalization of understanding the information provided (i.e., activity precautions, restrictions, etc) will improve Outcome: Progressing Goal: Individualized Educational Video(s) Outcome: Progressing   Problem: Activity: Goal: Ability to ambulate and perform ADLs will improve Outcome: Progressing   Problem: Clinical Measurements: Goal: Postoperative complications will be avoided or minimized Outcome: Progressing   Problem: Self-Concept: Goal:  Ability to maintain and perform role responsibilities to the fullest extent possible will improve Outcome: Progressing   Problem: Pain Management: Goal: Pain level will decrease Outcome: Progressing   

## 2018-03-11 NOTE — Progress Notes (Signed)
Subjective: The patient is alert and pleasant.  His son is at the bedside.  He has no complaints.  Objective: Vital signs in last 24 hours: Temp:  [97.5 F (36.4 C)-98.4 F (36.9 C)] 98.4 F (36.9 C) (11/14 0800) Pulse Rate:  [59-78] 59 (11/14 0400) Resp:  [10-15] 15 (11/14 0400) BP: (92-121)/(50-88) 106/76 (11/14 0400) SpO2:  [91 %-99 %] 94 % (11/14 0400) Estimated body mass index is 21.52 kg/m as calculated from the following:   Height as of this encounter: 5\' 10"  (1.778 m).   Weight as of this encounter: 68 kg.   Intake/Output from previous day: 11/13 0701 - 11/14 0700 In: -  Out: 1100 [Urine:1100] Intake/Output this shift: Total I/O In: 240 [P.O.:240] Out: -   Physical exam the patient is alert and oriented.  He is moving all 4 extremities.  I have reviewed the patient's follow-up head CT performed yesterday.  It demonstrates an enlargement of his left subdural hematoma with mild midline shift.  Lab Results: Recent Labs    03/09/18 0447 03/10/18 0250  WBC 8.7 4.4  HGB 9.3* 8.1*  HCT 30.9* 25.9*  PLT 225 172   BMET Recent Labs    03/09/18 0447 03/10/18 0250  NA 132* 133*  K 4.1 4.7  CL 102 104  CO2 24 26  GLUCOSE 100* 150*  BUN 14 18  CREATININE 0.62 0.66  CALCIUM 8.1* 7.9*    Studies/Results: Ct Head Wo Contrast  Result Date: 03/10/2018 CLINICAL DATA:  Left subdural hematoma follow-up EXAM: CT HEAD WITHOUT CONTRAST TECHNIQUE: Contiguous axial images were obtained from the base of the skull through the vertex without intravenous contrast. COMPARISON:  Head CT 03/09/2018 FINDINGS: Brain: The left convexity subdural hematoma has increased in size with evidence ongoing acute hemorrhage since the prior study. The hematoma now covers the entire left convexity within approximate average thickness of 7 mm. There is rightward midline shift of 2 mm, unchanged. No intraparenchymal abnormality. Vascular: Atherosclerotic calcification of the internal carotid  arteries at the skull base. No abnormal hyperdensity of the major intracranial arteries or dural venous sinuses. Skull: The visualized skull base, calvarium and extracranial soft tissues are normal. Sinuses/Orbits: No fluid levels or advanced mucosal thickening of the visualized paranasal sinuses. No mastoid or middle ear effusion. The orbits are normal. IMPRESSION: 1. Increased size of left convexity acute subdural hematoma, with continued acute bleeding since the prior scan. 2. Approximately 2 mm rightward midline shift. These results were called by telephone at the time of interpretation on 03/10/2018 at 8:29 pm to [facility] , who verbally acknowledged these results. Electronically Signed   By: Ulyses Jarred M.D.   On: 03/10/2018 20:29   Dg C-arm 1-60 Min  Result Date: 03/09/2018 CLINICAL DATA:  Thinning of the left hip EXAM: DG C-ARM 61-120 MIN COMPARISON:  Pelvis on left hip films of 03/09/2018 FINDINGS: Three nails are present for fixation of the left subcapital femoral fracture. No complicating features are seen. Fluoroscopy time of 30 seconds was recorded. IMPRESSION: C-arm fluoroscopy provided. Electronically Signed   By: Ivar Drape M.D.   On: 03/09/2018 15:08   Dg Hip Operative Unilat W Or W/o Pelvis Left  Result Date: 03/09/2018 CLINICAL DATA:  Pinning of the left hip EXAM: OPERATIVE left HIP (WITH PELVIS IF PERFORMED) 2 VIEWS TECHNIQUE: Fluoroscopic spot image(s) were submitted for interpretation post-operatively. COMPARISON:  Pelvis on left hip films of 03/09/2018 FINDINGS: Three nails are present for fixation of the left subcapital femoral fracture. No  complicating features are seen. IMPRESSION: Nails for fixation of left subcapital femoral fracture. Electronically Signed   By: Ivar Drape M.D.   On: 03/09/2018 15:07    Assessment/Plan: Left subdural hematoma: The patient is doing well clinically.  His subdural hematoma is a bit larger.  We will plan to repeat his CAT scan in a  few days.  He can be mobilized with PT from my point of view.  LOS: 2 days     Ophelia Charter 03/11/2018, 11:12 AM

## 2018-03-11 NOTE — Progress Notes (Signed)
[facility] PROGRESS NOTE  Steve Norris KGM:[id number] DOB: 1930-02-01 DOA: 03/09/2018 PCP: Deland Pretty, MD  Brief summary   Steve Norris  is a 84 y.o. male with past medical history relevant for paroxysmal A. fib chronically on Eliquis for anticoagulation, hypertension, dyslipidemia, history of sick sinus syndrome status post prior pacemaker placement, history of GERD, COPD dementia chronic anemia who presented on 03/09/2018 to the ED by EMS after being found on the floor at the skilled nursing facility after presumably falling and striking the left side of his head...... No evidence of trauma or bleeding, patient also had left hip pain. Patient received 1 dose of Andexxa to reverse his anticoagulation In ED.. CTA shows small subdural hemorrhage, Dr. Arnoldo Morale from neurosurgery was contacted.  Hip x-ray showed left hip fracture, orthopedic team was consulted   Assessment/Plan:  SDH. status post fall with acute subdural hematoma--- there is about 79mm of rightward midline shift, neurosurgical consult appreciated advised expectant management with repeat CT head -some increase in bleed. Remains asymptomatic. Exam is non focal. Plan to repeat TC in 24-48 hrs   Lt Femoral Neck Fx-- s/p ORIF on 03/09/18 , further treatment per orthopedic team  Lt superior pubic ramus fracture--- status post fall with left hip and Lt  pubic ramus fracture,  conservative management, orthopedic input and physical therapy input appreciated  PAFib--- continue sotalol 80 mg twice daily for rate control,, Eliquis on hold due to subdural hematoma, patient received 1 dose of Andexxa to reverse his anticoagulation. HR is stable   Dementia/Depression/Anorexia--stable at this time, patient is cooperative, continue Depakote 250 twice daily, trazodone as needed sleep continue Remeron 7.5 mg nightly  BPH with LUTs--okay to continue Proscar and Flomax  COPD. Stable, no s/s of acute exacerbation. Cont bronchodilators  prn   Code Status: full Family Communication: d/w patient, Therapist, sports. Updated his family (indicate person spoken with, relationship, and if by phone, the number) Disposition Plan: needs PT. Likely SNF   Consultants:  Neurosurgery    Orthopedics   Procedures:  Left hip pinning   Antibiotics: Anti-infectives (From admission, onward)   Start     Dose/Rate Route Frequency Ordered Stop   03/10/18 0600  clindamycin (CLEOCIN) IVPB 900 mg     900 mg 100 mL/hr over 30 Minutes Intravenous On call to O.R. 03/09/18 1223 03/09/18 1453   03/09/18 1225  clindamycin (CLEOCIN) 900 MG/50ML IVPB    Note to Pharmacy:  Henrine Screws   : cabinet override      03/09/18 1225 03/09/18 1438       (indicate start date, and stop date if known)  HPI/Subjective: Reports feeling well. No acute neuro symptoms. No headaches.   Objective: Vitals:   03/11/18 0400 03/11/18 0800  BP: 106/76   Pulse: (!) 59   Resp: 15   Temp: 98.4 F (36.9 C) 98.4 F (36.9 C)  SpO2: 94%     Intake/Output Summary (Last 24 hours) at 03/11/2018 1125 Last data filed at 03/11/2018 0855 Gross per 24 hour  Intake 240 ml  Output 1100 ml  Net -860 ml   Filed Weights   03/09/18 0221  Weight: 68 kg    Exam:   General:  No distress   Cardiovascular: s1,s2 rrr  Respiratory: CTA BL  Abdomen: soft, nt   Musculoskeletal: no leg edema    Data Reviewed: Basic Metabolic Panel: Recent Labs  Lab 03/09/18 0447 03/10/18 0250  NA 132* 133*  K 4.1 4.7  CL 102 104  CO2  24 26  GLUCOSE 100* 150*  BUN 14 18  CREATININE 0.62 0.66  CALCIUM 8.1* 7.9*   Liver Function Tests: No results for input(s): AST, ALT, ALKPHOS, BILITOT, PROT, ALBUMIN in the last 168 hours. No results for input(s): LIPASE, AMYLASE in the last 168 hours. No results for input(s): AMMONIA in the last 168 hours. CBC: Recent Labs  Lab 03/09/18 0447 03/10/18 0250  WBC 8.7 4.4  HGB 9.3* 8.1*  HCT 30.9* 25.9*  MCV 101.6* 100.4*  PLT 225 172    Cardiac Enzymes: No results for input(s): CKTOTAL, CKMB, CKMBINDEX, TROPONINI in the last 168 hours. BNP (last 3 results) No results for input(s): BNP in the last 8760 hours.  ProBNP (last 3 results) No results for input(s): PROBNP in the last 8760 hours.  CBG: No results for input(s): GLUCAP in the last 168 hours.  No results found for this or any previous visit (from the past 240 hour(s)).   Studies: Ct Head Wo Contrast  Result Date: 03/10/2018 CLINICAL DATA:  Left subdural hematoma follow-up EXAM: CT HEAD WITHOUT CONTRAST TECHNIQUE: Contiguous axial images were obtained from the base of the skull through the vertex without intravenous contrast. COMPARISON:  Head CT 03/09/2018 FINDINGS: Brain: The left convexity subdural hematoma has increased in size with evidence ongoing acute hemorrhage since the prior study. The hematoma now covers the entire left convexity within approximate average thickness of 7 mm. There is rightward midline shift of 2 mm, unchanged. No intraparenchymal abnormality. Vascular: Atherosclerotic calcification of the internal carotid arteries at the skull base. No abnormal hyperdensity of the major intracranial arteries or dural venous sinuses. Skull: The visualized skull base, calvarium and extracranial soft tissues are normal. Sinuses/Orbits: No fluid levels or advanced mucosal thickening of the visualized paranasal sinuses. No mastoid or middle ear effusion. The orbits are normal. IMPRESSION: 1. Increased size of left convexity acute subdural hematoma, with continued acute bleeding since the prior scan. 2. Approximately 2 mm rightward midline shift. These results were called by telephone at the time of interpretation on 03/10/2018 at 8:29 pm to [facility] , who verbally acknowledged these results. Electronically Signed   By: Ulyses Jarred M.D.   On: 03/10/2018 20:29   Dg C-arm 1-60 Min  Result Date: 03/09/2018 CLINICAL DATA:  Thinning of the left hip EXAM: DG  C-ARM 61-120 MIN COMPARISON:  Pelvis on left hip films of 03/09/2018 FINDINGS: Three nails are present for fixation of the left subcapital femoral fracture. No complicating features are seen. Fluoroscopy time of 30 seconds was recorded. IMPRESSION: C-arm fluoroscopy provided. Electronically Signed   By: Ivar Drape M.D.   On: 03/09/2018 15:08   Dg Hip Operative Unilat W Or W/o Pelvis Left  Result Date: 03/09/2018 CLINICAL DATA:  Pinning of the left hip EXAM: OPERATIVE left HIP (WITH PELVIS IF PERFORMED) 2 VIEWS TECHNIQUE: Fluoroscopic spot image(s) were submitted for interpretation post-operatively. COMPARISON:  Pelvis on left hip films of 03/09/2018 FINDINGS: Three nails are present for fixation of the left subcapital femoral fracture. No complicating features are seen. IMPRESSION: Nails for fixation of left subcapital femoral fracture. Electronically Signed   By: Ivar Drape M.D.   On: 03/09/2018 15:07    Scheduled Meds: . divalproex  250 mg Oral BID  . feeding supplement (ENSURE ENLIVE)  1 Bottle Oral BID BM  . ferrous sulfate  325 mg Oral BID WC  . finasteride  5 mg Oral Daily  . lactulose  20 g Oral Daily  . mirtazapine  7.5 mg Oral QHS  . olopatadine  1 drop Both Eyes BID  . rosuvastatin  10 mg Oral Daily  . sodium chloride flush  3 mL Intravenous Q12H  . sotalol  80 mg Oral Q12H  . tamsulosin  0.4 mg Oral Daily   Continuous Infusions: . sodium chloride 250 mL (03/09/18 1730)    Principal Problem:   SDH (subdural hematoma) (HCC) Active Problems:   COPD (chronic obstructive pulmonary disease) (HCC)   S/P placement of cardiac pacemaker   Atrial fibrillation (HCC)   Long term current use of anticoagulant therapy   SSS (sick sinus syndrome) ([place])   Benign essential HTN   Lt Hip fracture requiring operative repair [facility])   Fall at nursing home   Fall at [facility], initial encounter    Time spent: >35 minutes     Kinnie Feil  [facility] Pager [phone]. If 7PM-7AM,  please contact night-coverage at [email or web address], password [facility] 03/11/2018, 11:25 AM  LOS: 2 days

## 2018-03-11 NOTE — Progress Notes (Signed)
    Subjective: Patient reports pain as mild.  Tolerating some diet.   Up to chair yesterday w/ therapy.  A little restless overnight per son's report.    Objective:   VITALS:   Vitals:   03/10/18 2000 03/10/18 2300 03/11/18 0000 03/11/18 0400  BP: 121/88 (!) 95/51 (!) 92/50 106/76  Pulse:   (!) 59 (!) 59  Resp:   11 15  Temp: 98.2 F (36.8 C) 98.4 F (36.9 C)  98.4 F (36.9 C)  TempSrc: Oral Oral  Oral  SpO2: 91% 92% 92% 94%  Weight:      Height:       CBC Latest Ref Rng & Units 03/10/2018 03/09/2018 02/05/2018  WBC 4.0 - 10.5 K/uL 4.4 8.7 7.1  Hemoglobin 13.0 - 17.0 g/dL 8.1(L) 9.3(L) 8.5(L)  Hematocrit 39.0 - 52.0 % 25.9(L) 30.9(L) 28.1(L)  Platelets 150 - 400 K/uL 172 225 263   BMP Latest Ref Rng & Units 03/10/2018 03/09/2018 12/29/2017  Glucose 70 - 99 mg/dL 150(H) 100(H) 119(H)  BUN 8 - 23 mg/dL 18 14 12   Creatinine 0.61 - 1.24 mg/dL 0.66 0.62 0.71  Sodium 135 - 145 mmol/L 133(L) 132(L) 139  Potassium 3.5 - 5.1 mmol/L 4.7 4.1 4.2  Chloride 98 - 111 mmol/L 104 102 103  CO2 22 - 32 mmol/L 26 24 29   Calcium 8.9 - 10.3 mg/dL 7.9(L) 8.1(L) 8.7(L)   Intake/Output      11/13 0701 - 11/14 0700 11/14 0701 - 11/15 0700   P.O.     I.V. (mL/kg)     IV Piggyback     Total Intake(mL/kg)     Urine (mL/kg/hr) 1100 (0.7)    Total Output 1100    Net -1100            Physical Exam: General: NAD.  Supine in bed.  Son at bedside.  Interactive with exam and answers questions appropriately.  No increased work of breathing.  EOMI. MSK LLE: Neurovascularly intact Sensation intact distally Feet warm Dorsiflexion/Plantar flexion intact Incision: dressing C/D/I   Assessment: 2 Days Post-Op  S/P Procedure(s) (LRB): CANNULATED HIP PINNING (N/A) by Dr. Ernesta Amble. Percell Miller on 03/09/2017  Principal Problem:   SDH (subdural hematoma) (HCC) Active Problems:   COPD (chronic obstructive pulmonary disease) (HCC)   S/P placement of cardiac pacemaker   Atrial fibrillation ([place])  Long term current use of anticoagulant therapy   SSS (sick sinus syndrome) (HCC)   Benign essential HTN   Lt Hip fracture requiring operative repair ([place])   Fall at nursing home   Fall at [facility], initial encounter   Left femoral neck fracture, status post cannulated hip pinning Left pubic rami fracture - non operative. Clinically still doing well postop day 2 Pain controlled. Stable from an orthopedic perspective.  Plan: Up with therapy when appropriate Incentive Spirometry Apply ice as needed   Weightbearing: WBAT LLE Insicional and dressing care: Dressings left intact until follow-up Showering: Keep dressing dry VTE prophylaxis: Defer to primary team due to SDH.  Eliquis held.  SCDs, mobilize. Pain control: Minimize narcotics Follow - up plan: 2 weeks Contact information:  Edmonia Lynch MD, Roxan Hockey PA-C  Dispo: Per primary/neurosurgery team.  Therapy evaluations ongoing - SNF recommended.      Charna Elizabeth Martensen III, PA-C 03/11/2018, 7:06 AM

## 2018-03-11 NOTE — Clinical Social Work Note (Signed)
Clinical Social Work Assessment  Patient Details  Name: Steve Norris MRN: [id number] Date of Birth: May 27, 1929  Date of referral:  03/11/18               Reason for consult:  Facility Placement, Discharge Planning                Permission sought to share information with:  Family Supports, Customer service manager Permission granted to share information::  Yes, Verbal Permission Granted  Name::     Saud Bail  Agency::  SNFs- preference for [facility]  Relationship::  son  Contact Information:  [phone]  Housing/Transportation Living arrangements for the past 2 months:  [place] of Information:  Patient, Adult Children Patient Interpreter Needed:  None Criminal Activity/Legal Involvement Pertinent to Current Situation/Hospitalization:  No - Comment as needed Significant Relationships:  Adult Children, Warehouse manager, Other Family Members Lives with:  Facility Resident Do you feel safe going back to the place where you live?  Yes Need for family participation in patient care:  Yes (Comment)  Care giving concerns:  Pt from [facility], pt recommended for SNF level therapies at discharge. Pt and pt son in agreement for SNF before returning home to ALF.   Social Worker assessment / plan:  CSW spoke with pt and pt son Theodis Sato. at bedside- introduced self role and reason for visit. Pt son confirms pt is from [facility]. Pt was living at home alone before he moved into [facility]. Pt son pleased with [facility] and pt has friends at facility. Pt has another son who lives near [place] and still works, pt son [name] is retired. Pt has been to [facility] before and enjoyed the care there- both pt and pt son requesting for CSW to follow up with [facility]. If [facility] is able to offer a bed they would like to accept and start insurance authorization.  Employment status:  Retired Office manager PT Recommendations:  [place], [place] / Referral to community resources:  [place]  Patient/Family's Response to care:  Pt and pt son state amenable to speaking with CSW and they would like for pt to discharge to [facility].  Patient/Family's Understanding of and Emotional Response to Diagnosis, Current Treatment, and Prognosis:  Pt and pt son state understanding of diagnosis, current treatment and prognosis. Pt son seems to have a good understanding of pt care and provides emotional support and further clarity to pt when needed. Pt and pt son both emotionally appropriate throughout assessment, despite hearing loss and memory challenges pt does answer questions appropriately and expressed that he liked [facility].   Emotional Assessment Appearance:  Appears stated age Attitude/Demeanor/Rapport:  Gracious Affect (typically observed):  Accepting, Adaptable, Appropriate Orientation:  Oriented to Self, Fluctuating Orientation (Suspected and/or reported Sundowners) Alcohol / Substance use:  Not Applicable Psych involvement (Current and /or in the community):  No (Comment)  Discharge Needs  Concerns to be addressed:  Care Coordination Readmission within the last 30 days:  No Current discharge risk:  Cognitively Impaired, Physical Impairment Barriers to Discharge:  Ship broker, Continued Medical Work up   [employer], [place] 03/11/2018, 11:47 AM

## 2018-03-11 NOTE — Social Work (Signed)
[facility] is able to offer a bed- pt and pt son have accepted, authorization has been initiated.  Westley Hummer, MSW, [place] Work [phone]

## 2018-03-12 LAB — CBC
HCT: 26.4 % — ABNORMAL LOW (ref 39.0–52.0)
Hemoglobin: 8.2 g/dL — ABNORMAL LOW (ref 13.0–17.0)
MCH: 31.4 pg (ref 26.0–34.0)
MCHC: 31.1 g/dL (ref 30.0–36.0)
MCV: 101.1 fL — ABNORMAL HIGH (ref 80.0–100.0)
NRBC: 0 % (ref 0.0–0.2)
PLATELETS: 198 10*3/uL (ref 150–400)
RBC: 2.61 MIL/uL — ABNORMAL LOW (ref 4.22–5.81)
RDW: 21.7 % — AB (ref 11.5–15.5)
WBC: 9.7 10*3/uL (ref 4.0–10.5)

## 2018-03-12 MED ORDER — SENNOSIDES-DOCUSATE SODIUM 8.6-50 MG PO TABS: 1.0000 | ORAL_TABLET | Freq: Two times a day (BID) | ORAL | Status: AC

## 2018-03-12 MED ORDER — HYDROCODONE-ACETAMINOPHEN 5-325 MG PO TABS: 1.0000 | ORAL_TABLET | ORAL | 0 refills | Status: AC | PRN

## 2018-03-12 NOTE — Progress Notes (Signed)
Subjective: Patient reports pain as mild.  "Doing better every day."  Tolerating diet.   Up to chair again yesterday w/ therapy - sore in hip when OOB, but otherwise comfortable.  Objective:   VITALS:   Vitals:   03/11/18 1646 03/11/18 2000 03/12/18 0000 03/12/18 0400  BP: (!) 97/54     Pulse: 82     Resp: 14     Temp:  98.1 F (36.7 C) (!) 97.5 F (36.4 C) 97.8 F (36.6 C)  TempSrc:  Oral Oral Oral  SpO2: 92%     Weight:      Height:       CBC Latest Ref Rng & Units 03/12/2018 03/10/2018 03/09/2018  WBC 4.0 - 10.5 K/uL 9.7 4.4 8.7  Hemoglobin 13.0 - 17.0 g/dL 8.2(L) 8.1(L) 9.3(L)  Hematocrit 39.0 - 52.0 % 26.4(L) 25.9(L) 30.9(L)  Platelets 150 - 400 K/uL 198 172 225   BMP Latest Ref Rng & Units 03/10/2018 03/09/2018 12/29/2017  Glucose 70 - 99 mg/dL 150(H) 100(H) 119(H)  BUN 8 - 23 mg/dL 18 14 12   Creatinine 0.61 - 1.24 mg/dL 0.66 0.62 0.71  Sodium 135 - 145 mmol/L 133(L) 132(L) 139  Potassium 3.5 - 5.1 mmol/L 4.7 4.1 4.2  Chloride 98 - 111 mmol/L 104 102 103  CO2 22 - 32 mmol/L 26 24 29   Calcium 8.9 - 10.3 mg/dL 7.9(L) 8.1(L) 8.7(L)   Intake/Output      11/14 0701 - 11/15 0700 11/15 0701 - 11/16 0700   P.O. 560    Total Intake(mL/kg) 560 (8.2)    Urine (mL/kg/hr) 1000 (0.6)    Stool 0    Total Output 1000    Net -440         Stool Occurrence 1 x       Physical Exam: General: NAD.  Supine in bed.  Son at bedside.  Interactive with exam and answers questions appropriately.  No increased work of breathing.  EOMI. MSK LLE: Neurovascularly intact Sensation intact distally Feet warm Dorsiflexion/Plantar flexion intact Incision: dressing C/D/I   Assessment: 3 Days Post-Op  S/P Procedure(s) (LRB): CANNULATED HIP PINNING (N/A) by Dr. Ernesta Amble. Percell Miller on 03/09/2017  Principal Problem:   SDH (subdural hematoma) (HCC) Active Problems:   COPD (chronic obstructive pulmonary disease) (HCC)   S/P placement of cardiac pacemaker   Atrial fibrillation  ([place])   Long term current use of anticoagulant therapy   SSS (sick sinus syndrome) (HCC)   Benign essential HTN   Lt Hip fracture requiring operative repair ([place])   Fall at nursing home   Fall at [facility], initial encounter   Left femoral neck fracture, status post cannulated hip pinning Left pubic rami fracture - non operative. Clinically continues to improve. Hgb Stable. Pain controlled. Stable from an orthopedic perspective.  Plan: Mobilize with therapy Incentive Spirometry Apply ice as needed   Weightbearing: WBAT LLE Insicional and dressing care: Dressings left intact until follow-up Showering: Keep dressing dry VTE prophylaxis: Defer to primary team due to SDH.  Eliquis held.  SCDs, mobilize. Pain control: Minimize narcotics.  Tylenol for mild pain. Follow - up plan: 2 weeks Contact information:  Edmonia Lynch MD, Roxan Hockey PA-C  Dispo:   Stable from an orthopedic perspective.    Continue care / recs per Neurosurgery and Medicine.  Plan in process to d/c to [facility] when ready medically.  Follow up in the office with Dr. Alain Marion in 2 weeks.  Please call with questions.  Charna Elizabeth Martensen III, PA-C 03/12/2018, 7:44 AM

## 2018-03-12 NOTE — Social Work (Signed)
Clinical Social Worker facilitated patient discharge including contacting patient family and facility to confirm patient discharge plans.  Clinical information faxed to facility and family agreeable with plan.  CSW arranged ambulance transport via [facility] to [facility].  RN to call [phone] with report prior to discharge.  Clinical Social Worker will sign off for now as social work intervention is no longer needed. Please consult [place] again if new need arises.  Alexander Mt, [place] Social Worker [phone]

## 2018-03-12 NOTE — Progress Notes (Signed)
Pt stable for transport. [facility] arrived at bedside. Pt's son at bedside commented to RN that father was "not himself". Son reports pt gets confused in the middle of the night (3/4am) and says non-sensical things. The pt made a comment this evening similar to the confusion the son has noticed beforehand. However, it was earlier than usual. RN asked if he wanted to stay and have pt evaluated further and he declined. Pt's son said that if it wasn't his father's usual confusion or hospital delirium then they would find out once the pt was at the facility. RN paged on call MD and shared son's concerns. No new orders placed. Pt transported to facility via [facility].

## 2018-03-12 NOTE — Discharge Summary (Signed)
Physician Discharge Summary  Steve Norris XVQ:[id number] DOB: [birth year] DOA: 03/09/2018  PCP: Deland Pretty, MD  Admit date: 03/09/2018 Discharge date: 03/12/2018  Time spent: >35 minutes  Recommendations for Outpatient Follow-up:  SNF. MD at the facility  Dr. Arnoldo Morale Neurosurgery in 5-7 days with repeat CT head Orthopedics Follow up in the office with Dr. Alain Marion in 2 weeks   Discharge Diagnoses:  Principal Problem:   SDH (subdural hematoma) ([place]) Active Problems:   COPD (chronic obstructive pulmonary disease) ([place])   S/P placement of cardiac pacemaker   Atrial fibrillation ([place])   Long term current use of anticoagulant therapy   SSS (sick sinus syndrome) ([place])   Benign essential HTN   Lt Hip fracture requiring operative repair ([place])   Fall at nursing home   Fall at [facility], initial encounter   Discharge Condition: stable   Diet recommendation: low sodium   Filed Weights   03/09/18 0221  Weight: 68 kg    History of present illness:   Steve Norris a88 y.o.malewith past medical history relevant for paroxysmal A. fib chronically on Eliquis for anticoagulation, hypertension, dyslipidemia, history of sick sinus syndrome status post prior pacemaker placement, history of GERD,COPD dementia chronic anemia who presented on 03/09/2018 to the ED by EMS after being found on the floor at the skilled nursing facility after presumably falling and striking the left side of his head....Marland KitchenMarland KitchenNo evidence of trauma or bleeding, patient also had left hip pain. Patient received 1 dose of Andexxa to reverse his anticoagulation In ED..CTA shows small subdural hemorrhage, Dr. Arnoldo Morale from neurosurgery was contacted.  Hip x-ray showed left hip fracture, orthopedic team was consulted   Hospital Course:   SDH. status post fall with acute subdural hematoma---there is about 38mm of rightward midline shift,neurosurgical consult appreciated advised expectant management. Repeat CT head with  some increase in bleed. Remains asymptomatic. Exam is non focal.  -per neurosurgery to repeat head ct next week and f/u in his office    Lt Femoral Neck Fx-- s/p ORIF on 03/09/18 ,further treatment per orthopedic team. SNF at discharge   Ltsuperior pubic ramus fracture---status post fall with left hip and Ltpubic ramusfracture -conservative management, orthopedic input and physical therapy input appreciated. Needs SNF  PAFib---continue sotalol 80 mg twice daily for rate control,Eliquis on hold due to subdural hematoma, patient received 1 dose of Andexxa to reverse his anticoagulation. HR is stable. D/w patient, his son about risks of stroke and bleeding at length, cont to hold anticoagulation   Dementia/Depression/Anorexia--stable at this time, patient is cooperative, continue Depakote 250 twice daily, trazodone as needed sleep continue Remeron 7.5 mg nightly  BPH with LUTs--okay to continue Proscar and Flomax  COPD. Stable, no s/s of acute exacerbation. Cont bronchodilators prn   Procedures:  s/p ORIF on 03/09/18 (i.e. Studies not automatically included, echos, thoracentesis, etc; not x-rays)  Consultations:  Orthopedics  Neurosurgery   Discharge Exam: Vitals:   03/12/18 0800 03/12/18 1247  BP:    Pulse:    Resp:    Temp: 99.1 F (37.3 C) (!) 97.5 F (36.4 C)  SpO2:      General: no distress  Cardiovascular: s1,s2 rrr Respiratory: CTA BL  Discharge Instructions  Discharge Instructions    Diet - low sodium heart healthy   Complete by:  As directed    Increase activity slowly   Complete by:  As directed      Allergies as of 03/12/2018      Reactions   Amiodarone Nausea And  Vomiting   Bactrim [sulfamethoxazole-trimethoprim] Other (See Comments)   Unknown reaction. Listed on MAR   Penicillins Hives, Swelling, Rash      Medication List    STOP taking these medications   apixaban 2.5 MG Tabs tablet Commonly known as:  ELIQUIS   potassium  chloride SA 20 MEQ tablet Commonly known as:  K-DUR,KLOR-CON     TAKE these medications   acetaminophen 500 MG tablet Commonly known as:  TYLENOL Take 500 mg by mouth every 6 (six) hours as needed for mild pain or fever. What changed:  Another medication with the same name was removed. Continue taking this medication, and follow the directions you see here.   albuterol 0.63 MG/3ML nebulizer solution Commonly known as:  ACCUNEB Take 1 ampule by nebulization every 8 (eight) hours as needed for wheezing.   divalproex 250 MG DR tablet Commonly known as:  DEPAKOTE TAKE (1) TABLET BY MOUTH TWICE DAILY. What changed:  See the new instructions.   feeding supplement (ENSURE ENLIVE) Liqd Take 1 Bottle by mouth 2 (two) times daily between meals.   FEROSUL 325 (65 FE) MG tablet Generic drug:  ferrous sulfate Take 325 mg by mouth 2 (two) times daily with a meal.   finasteride 5 MG tablet Commonly known as:  PROSCAR Take 5 mg by mouth daily.   Fluticasone-Salmeterol 250-50 MCG/DOSE Aepb Commonly known as:  ADVAIR Inhale 1 puff into the lungs 2 (two) times daily.   HYDROcodone-acetaminophen 5-325 MG tablet Commonly known as:  NORCO/VICODIN Take 1 tablet by mouth every 6 (six) hours as needed for moderate pain.   HYDROcodone-acetaminophen 5-325 MG tablet Commonly known as:  NORCO/VICODIN Take 1 tablet by mouth every 4 (four) hours as needed for up to 3 days for moderate pain (pain score 4-6).   lactulose 10 GM/15ML solution Commonly known as:  CHRONULAC Take 20 g by mouth daily.   mineral oil liquid Place 0.05 mLs in ear(s) once a week. On Thursday   MINERIN Crea Apply 1 application topically at bedtime. To back   mirtazapine 7.5 MG tablet Commonly known as:  REMERON Take 7.5 mg by mouth at bedtime.   nitroGLYCERIN 0.4 MG SL tablet Commonly known as:  NITROSTAT Place 0.4 mg under the tongue every 5 (five) minutes as needed for chest pain.   PATADAY 0.2 % Soln Generic  drug:  Olopatadine HCl Place 1 drop into both eyes daily.   rosuvastatin 10 MG tablet Commonly known as:  CRESTOR Take 10 mg by mouth daily.   senna-docusate 8.6-50 MG tablet Commonly known as:  Senokot-S Take 1 tablet by mouth 2 (two) times daily.   sotalol 80 MG tablet Commonly known as:  BETAPACE take 1 tablet by mouth every 12 hours What changed:    how much to take  how to take this  when to take this   tamsulosin 0.4 MG Caps capsule Commonly known as:  FLOMAX Take 1 capsule by mouth daily.   Vitamin D3 50 MCG (2000 UT) capsule Take 4,000 Units by mouth daily.   Whey Protein Powd Take 1 Scoop by mouth 2 (two) times daily.      Allergies  Allergen Reactions  . Amiodarone Nausea And Vomiting  . Bactrim [Sulfamethoxazole-Trimethoprim] Other (See Comments)    Unknown reaction. Listed on MAR  . Penicillins Hives, Swelling and Rash    Contact information for follow-up providers    Renette Butters, MD.   Specialty:  Orthopedic Surgery Contact information: [address] [place] [address] [phone]            Contact information for after-discharge care    Destination    [facility] Preferred SNF .   Service:  Skilled Nursing Contact information: [address] [place] [phone]                   The results of significant diagnostics from this hospitalization (including imaging, microbiology, ancillary and laboratory) are listed below for reference.    Significant Diagnostic Studies: Ct Head Wo Contrast  Result Date: 03/10/2018 CLINICAL DATA:  Left subdural hematoma follow-up EXAM: CT HEAD WITHOUT CONTRAST TECHNIQUE: Contiguous axial images were obtained from the base of the skull through the vertex without intravenous contrast. COMPARISON:  Head CT 03/09/2018 FINDINGS: Brain: The left convexity subdural hematoma has increased in size with evidence ongoing acute  hemorrhage since the prior study. The hematoma now covers the entire left convexity within approximate average thickness of 7 mm. There is rightward midline shift of 2 mm, unchanged. No intraparenchymal abnormality. Vascular: Atherosclerotic calcification of the internal carotid arteries at the skull base. No abnormal hyperdensity of the major intracranial arteries or dural venous sinuses. Skull: The visualized skull base, calvarium and extracranial soft tissues are normal. Sinuses/Orbits: No fluid levels or advanced mucosal thickening of the visualized paranasal sinuses. No mastoid or middle ear effusion. The orbits are normal. IMPRESSION: 1. Increased size of left convexity acute subdural hematoma, with continued acute bleeding since the prior scan. 2. Approximately 2 mm rightward midline shift. These results were called by telephone at the time of interpretation on 03/10/2018 at 8:29 pm to [facility] , who verbally acknowledged these results. Electronically Signed   By: Ulyses Jarred M.D.   On: 03/10/2018 20:29   Ct Head Wo Contrast  Result Date: 03/09/2018 CLINICAL DATA:  Status post unwitnessed fall. Patient found down. Acute onset of left-sided head pain. Concern for cervical spine injury. Initial encounter. EXAM: CT HEAD WITHOUT CONTRAST CT CERVICAL SPINE WITHOUT CONTRAST TECHNIQUE: Multidetector CT imaging of the head and cervical spine was performed following the standard protocol without intravenous contrast. Multiplanar CT image reconstructions of the cervical spine were also generated. COMPARISON:  CT of the head performed 05/09/2017, and CT of the cervical spine performed 12/26/2016 FINDINGS: CT HEAD FINDINGS Brain: There is acute subdural hematoma tracking along the posterior left parietal lobe, measuring up to 1.1 cm in thickness, with mild associated mass effect. There is approximately 2 mm of rightward midline shift at this time. No evidence of acute infarction or hydrocephalus. Mild  periventricular white matter change likely reflects small vessel ischemic microangiopathy. Mild cerebellar atrophy is noted. The brainstem and fourth ventricle are within normal limits. The third and lateral ventricles, and basal ganglia are unremarkable in appearance. Vascular: No hyperdense vessel or unexpected calcification. Skull: There is no evidence of fracture; visualized osseous structures are unremarkable in appearance. Sinuses/Orbits: The orbits are within normal limits. There is complete opacification of the mastoid air cells bilaterally. The paranasal sinuses are well-aerated. Other: Soft tissue swelling is noted lateral to the left orbit and overlying the left frontal calvarium. CT CERVICAL SPINE FINDINGS Alignment: Normal. Skull base and vertebrae: No acute fracture. No primary bone lesion or focal pathologic process. Soft tissues and spinal canal: No prevertebral fluid or swelling. No visible canal hematoma. Disc levels: There is mild intervertebral disc space narrowing at C5-C6, with small anterior and posterior disc osteophyte complexes. A  posterior disc osteophyte complex is noted at C6-C7, with narrowing of the spinal canal to 8 mm at this level. Facet disease is noted along the cervical spine. Degenerative change is noted about the dens. Upper chest: Emphysema is noted at the lung apices. The visualized portions of the thyroid gland are unremarkable. Mild calcification is noted at the carotid bifurcations bilaterally. Other: No additional soft tissue abnormalities are seen. IMPRESSION: 1. Acute subdural hematoma tracking along the posterior left parietal lobe, measuring up to 1.1 cm in thickness, with mild associated mass effect. Approximately 2 mm of rightward midline shift at this time. 2. No evidence of fracture or subluxation along the cervical spine. 3. Soft tissue swelling lateral to the left orbit and overlying the left frontal calvarium. 4. Mild small vessel ischemic microangiopathy. 5.  Mild degenerative change at the lower cervical spine. Narrowing of the spinal canal to 8 mm at C6-C7, due to a posterior disc osteophyte complex. 6. Complete opacification of the mastoid air cells bilaterally. 7. Emphysema at the lung apices. 8. Mild calcification at the carotid bifurcations bilaterally. Carotid ultrasound could be considered for further evaluation, when and as deemed clinically appropriate. Emphysema (ICD10-J43.9). Critical Value/emergent results were called by telephone at the time of interpretation on 03/09/2018 at 3:16 am to Dr. Joseph Berkshire, who verbally acknowledged these results. Electronically Signed   By: Garald Balding M.D.   On: 03/09/2018 03:18   Ct Cervical Spine Wo Contrast  Result Date: 03/09/2018 CLINICAL DATA:  Status post unwitnessed fall. Patient found down. Acute onset of left-sided head pain. Concern for cervical spine injury. Initial encounter. EXAM: CT HEAD WITHOUT CONTRAST CT CERVICAL SPINE WITHOUT CONTRAST TECHNIQUE: Multidetector CT imaging of the head and cervical spine was performed following the standard protocol without intravenous contrast. Multiplanar CT image reconstructions of the cervical spine were also generated. COMPARISON:  CT of the head performed 05/09/2017, and CT of the cervical spine performed 12/26/2016 FINDINGS: CT HEAD FINDINGS Brain: There is acute subdural hematoma tracking along the posterior left parietal lobe, measuring up to 1.1 cm in thickness, with mild associated mass effect. There is approximately 2 mm of rightward midline shift at this time. No evidence of acute infarction or hydrocephalus. Mild periventricular white matter change likely reflects small vessel ischemic microangiopathy. Mild cerebellar atrophy is noted. The brainstem and fourth ventricle are within normal limits. The third and lateral ventricles, and basal ganglia are unremarkable in appearance. Vascular: No hyperdense vessel or unexpected calcification. Skull:  There is no evidence of fracture; visualized osseous structures are unremarkable in appearance. Sinuses/Orbits: The orbits are within normal limits. There is complete opacification of the mastoid air cells bilaterally. The paranasal sinuses are well-aerated. Other: Soft tissue swelling is noted lateral to the left orbit and overlying the left frontal calvarium. CT CERVICAL SPINE FINDINGS Alignment: Normal. Skull base and vertebrae: No acute fracture. No primary bone lesion or focal pathologic process. Soft tissues and spinal canal: No prevertebral fluid or swelling. No visible canal hematoma. Disc levels: There is mild intervertebral disc space narrowing at C5-C6, with small anterior and posterior disc osteophyte complexes. A posterior disc osteophyte complex is noted at C6-C7, with narrowing of the spinal canal to 8 mm at this level. Facet disease is noted along the cervical spine. Degenerative change is noted about the dens. Upper chest: Emphysema is noted at the lung apices. The visualized portions of the thyroid gland are unremarkable. Mild calcification is noted at the carotid bifurcations bilaterally. Other: No additional soft tissue  abnormalities are seen. IMPRESSION: 1. Acute subdural hematoma tracking along the posterior left parietal lobe, measuring up to 1.1 cm in thickness, with mild associated mass effect. Approximately 2 mm of rightward midline shift at this time. 2. No evidence of fracture or subluxation along the cervical spine. 3. Soft tissue swelling lateral to the left orbit and overlying the left frontal calvarium. 4. Mild small vessel ischemic microangiopathy. 5. Mild degenerative change at the lower cervical spine. Narrowing of the spinal canal to 8 mm at C6-C7, due to a posterior disc osteophyte complex. 6. Complete opacification of the mastoid air cells bilaterally. 7. Emphysema at the lung apices. 8. Mild calcification at the carotid bifurcations bilaterally. Carotid ultrasound could be  considered for further evaluation, when and as deemed clinically appropriate. Emphysema (ICD10-J43.9). Critical Value/emergent results were called by telephone at the time of interpretation on 03/09/2018 at 3:16 am to Dr. Joseph Berkshire, who verbally acknowledged these results. Electronically Signed   By: Garald Balding M.D.   On: 03/09/2018 03:18   Nm Pet Image Initial (pi) Skull Base To Thigh  Result Date: 02/17/2018 CLINICAL DATA:  Initial treatment strategy for lymphoma. Waldenstrm's macroglobulinemia/lymphoplasmacytic lymphoma, initial workup IgM Monoclonal paraproteinemia with pancytopenia concerning for Waldenstrom's for initial evaluation to guide diagnostic workup and treatment EXAM: NUCLEAR MEDICINE PET SKULL BASE TO THIGH TECHNIQUE: 7.1 mCi F-18 FDG was injected intravenously. Full-ring PET imaging was performed from the skull base to thigh after the radiotracer. CT data was obtained and used for attenuation correction and anatomic localization. Fasting blood glucose: 73 mg/dl COMPARISON:  None. FINDINGS: Mediastinal blood pool activity: SUV max 1.84 NECK: No hypermetabolic lymph nodes in the neck. Incidental CT findings: none CHEST: Hypermetabolic subcarinal lymph node along the RIGHT bronchus intermedius SUV max equal 9.3. Node measures 17 mm short axis (image 168/4 There are other smaller hypermetabolic lymph nodes which are symmetric RIGHT lower and LEFT lower paratracheal node stations and hilar nodal stations. Example RIGHT lower paratracheal lymph node with SUV max equal 5.8. These nodes are small and difficult to define noncontrast CT No hypermetabolic supraclavicular nodes. Potential enlarged and mildly hypermetabolic LEFT supraclavicular lymph node measures 15 mm short axis image 43/4 with SUV max 3.3. Centrilobular emphysema the upper lobes. Mild enters lobular septal thickening in lower lobes. Bilateral pleural effusions. No suspicious nodularity. Incidental CT findings: none  ABDOMEN/PELVIS: No abnormal hypermetabolic activity within the liver, pancreas, adrenal glands, or spleen. No hypermetabolic lymph nodes in the abdomen or pelvis. RIGHT external iliac lymph node with SUV max equal 3.5 with 10 mm short axis measurement. Mild activity associated with mildly prominent inguinal nodes with SUV max equal 3.1 on LEFT. The spleen is normal size and normal metabolic. Incidental CT findings: Simple cyst in the kidneys and solitary liver cyst. SKELETON: No focal hypermetabolic activity to suggest skeletal metastasis. Incidental CT findings: Chronic compression deformities in the upper lumbar spine. IMPRESSION: 1. Scattered minimally enlarged and mildly hypermetabolic lymph nodes could represent a low-grade lymphoproliferative process. Lymph nodes include LEFT supraclavicular node, small mediastinal lymph nodes, small external iliac lymph nodes and inguinal lymph nodes. 2. Single intensely hypermetabolic subcarinal lymph node could indicate a high-grade lymphoproliferative process. 3. Normal volume spleen.  No abnormal marrow activity. 4. Centrilobular emphysema, interstitial edema, bilateral pleural effusions. 5. Chronic compression fractures in lumbar spine. Electronically Signed   By: Suzy Bouchard M.D.   On: 02/17/2018 10:18   Dg C-arm 1-60 Min  Result Date: 03/09/2018 CLINICAL DATA:  Thinning of the left hip  EXAM: DG C-ARM 61-120 MIN COMPARISON:  Pelvis on left hip films of 03/09/2018 FINDINGS: Three nails are present for fixation of the left subcapital femoral fracture. No complicating features are seen. Fluoroscopy time of 30 seconds was recorded. IMPRESSION: C-arm fluoroscopy provided. Electronically Signed   By: Ivar Drape M.D.   On: 03/09/2018 15:08   Dg Hip Operative Unilat W Or W/o Pelvis Left  Result Date: 03/09/2018 CLINICAL DATA:  Pinning of the left hip EXAM: OPERATIVE left HIP (WITH PELVIS IF PERFORMED) 2 VIEWS TECHNIQUE: Fluoroscopic spot image(s) were submitted  for interpretation post-operatively. COMPARISON:  Pelvis on left hip films of 03/09/2018 FINDINGS: Three nails are present for fixation of the left subcapital femoral fracture. No complicating features are seen. IMPRESSION: Nails for fixation of left subcapital femoral fracture. Electronically Signed   By: Ivar Drape M.D.   On: 03/09/2018 15:07   Dg Hip Unilat W Or Wo Pelvis 2-3 Views Left  Result Date: 03/09/2018 CLINICAL DATA:  Pain after fall. EXAM: DG HIP (WITH OR WITHOUT PELVIS) 2-3V LEFT COMPARISON:  None. FINDINGS: There is a fracture through the left femoral neck and another through the left superior pubic ramus. IMPRESSION: Fractures through the left femoral neck and the left superior pubic ramus. Electronically Signed   By: Dorise Bullion III M.D   On: 03/09/2018 02:51    Microbiology: No results found for this or any previous visit (from the past 240 hour(s)).   Labs: Basic Metabolic Panel: Recent Labs  Lab 03/09/18 0447 03/10/18 0250  NA 132* 133*  K 4.1 4.7  CL 102 104  CO2 24 26  GLUCOSE 100* 150*  BUN 14 18  CREATININE 0.62 0.66  CALCIUM 8.1* 7.9*   Liver Function Tests: No results for input(s): AST, ALT, ALKPHOS, BILITOT, PROT, ALBUMIN in the last 168 hours. No results for input(s): LIPASE, AMYLASE in the last 168 hours. No results for input(s): AMMONIA in the last 168 hours. CBC: Recent Labs  Lab 03/09/18 0447 03/10/18 0250 03/12/18 0414  WBC 8.7 4.4 9.7  HGB 9.3* 8.1* 8.2*  HCT 30.9* 25.9* 26.4*  MCV 101.6* 100.4* 101.1*  PLT 225 172 198   Cardiac Enzymes: No results for input(s): CKTOTAL, CKMB, CKMBINDEX, TROPONINI in the last 168 hours. BNP: BNP (last 3 results) No results for input(s): BNP in the last 8760 hours.  ProBNP (last 3 results) No results for input(s): PROBNP in the last 8760 hours.  CBG: No results for input(s): GLUCAP in the last 168 hours.     SignedKinnie Feil  [facility] 03/12/2018, 1:40 PM

## 2018-03-12 NOTE — Progress Notes (Signed)
[facility] PROGRESS NOTE  DEON DUER NWG:[id number] DOB: 01-Mar-1930 DOA: 03/09/2018 PCP: Deland Pretty, MD  Brief summary   Steve Norris  is a 84 y.o. male with past medical history relevant for paroxysmal A. fib chronically on Eliquis for anticoagulation, hypertension, dyslipidemia, history of sick sinus syndrome status post prior pacemaker placement, history of GERD, COPD dementia chronic anemia who presented on 03/09/2018 to the ED by EMS after being found on the floor at the skilled nursing facility after presumably falling and striking the left side of his head...... No evidence of trauma or bleeding, patient also had left hip pain. Patient received 1 dose of Andexxa to reverse his anticoagulation In ED.. CTA shows small subdural hemorrhage, Dr. Arnoldo Morale from neurosurgery was contacted.  Hip x-ray showed left hip fracture, orthopedic team was consulted   Assessment/Plan:  SDH. status post fall with acute subdural hematoma--- there is about 73mm of rightward midline shift, neurosurgical consult appreciated advised expectant management. Repeat CT head with some increase in bleed. Remains asymptomatic. Exam is non focal.  -per neurosurgery to repeat head ct next week and f/u in his office    Lt Femoral Neck Fx-- s/p ORIF on 03/09/18 , further treatment per orthopedic team. Needs SNF  Lt superior pubic ramus fracture--- status post fall with left hip and Lt  pubic ramus fracture - conservative management, orthopedic input and physical therapy input appreciated. Needs SNF  PAFib--- continue sotalol 80 mg twice daily for rate control, Eliquis on hold due to subdural hematoma, patient received 1 dose of Andexxa to reverse his anticoagulation. HR is stable. D/w patient, her son about risk of strokes and bleeding at length, to hold anticoagulation   Dementia/Depression/Anorexia--stable at this time, patient is cooperative, continue Depakote 250 twice daily, trazodone as needed sleep  continue Remeron 7.5 mg nightly  BPH with LUTs--okay to continue Proscar and Flomax  COPD. Stable, no s/s of acute exacerbation. Cont bronchodilators prn   Code Status: full Family Communication: d/w patient, Therapist, sports. Updated his family (indicate person spoken with, relationship, and if by phone, the number) Disposition Plan: TF from SDU_> med surg -dispo needs  SNF, in process    Consultants:  Neurosurgery    Orthopedics   Procedures:  Left hip pinning   Antibiotics: Anti-infectives (From admission, onward)   Start     Dose/Rate Route Frequency Ordered Stop   03/10/18 0600  clindamycin (CLEOCIN) IVPB 900 mg     900 mg 100 mL/hr over 30 Minutes Intravenous On call to O.R. 03/09/18 1223 03/09/18 1453   03/09/18 1225  clindamycin (CLEOCIN) 900 MG/50ML IVPB    Note to Pharmacy:  Henrine Screws   : cabinet override      03/09/18 1225 03/09/18 1438       (indicate start date, and stop date if known)  HPI/Subjective: No acute symptoms. Reports feeling well. No acute neuro symptoms. No headaches.   Objective: Vitals:   03/12/18 0400 03/12/18 0800  BP:    Pulse:    Resp:    Temp: 97.8 F (36.6 C) 99.1 F (37.3 C)  SpO2:      Intake/Output Summary (Last 24 hours) at 03/12/2018 1012 Last data filed at 03/11/2018 1828 Gross per 24 hour  Intake 320 ml  Output 1000 ml  Net -680 ml   Filed Weights   03/09/18 0221  Weight: 68 kg    Exam:   General:  No distress   Cardiovascular: s1,s2 rrr  Respiratory: CTA BL  Abdomen: soft,  nt   Musculoskeletal: no leg edema    Data Reviewed: Basic Metabolic Panel: Recent Labs  Lab 03/09/18 0447 03/10/18 0250  NA 132* 133*  K 4.1 4.7  CL 102 104  CO2 24 26  GLUCOSE 100* 150*  BUN 14 18  CREATININE 0.62 0.66  CALCIUM 8.1* 7.9*   Liver Function Tests: No results for input(s): AST, ALT, ALKPHOS, BILITOT, PROT, ALBUMIN in the last 168 hours. No results for input(s): LIPASE, AMYLASE in the last 168 hours. No  results for input(s): AMMONIA in the last 168 hours. CBC: Recent Labs  Lab 03/09/18 0447 03/10/18 0250 03/12/18 0414  WBC 8.7 4.4 9.7  HGB 9.3* 8.1* 8.2*  HCT 30.9* 25.9* 26.4*  MCV 101.6* 100.4* 101.1*  PLT 225 172 198   Cardiac Enzymes: No results for input(s): CKTOTAL, CKMB, CKMBINDEX, TROPONINI in the last 168 hours. BNP (last 3 results) No results for input(s): BNP in the last 8760 hours.  ProBNP (last 3 results) No results for input(s): PROBNP in the last 8760 hours.  CBG: No results for input(s): GLUCAP in the last 168 hours.  No results found for this or any previous visit (from the past 240 hour(s)).   Studies: Ct Head Wo Contrast  Result Date: 03/10/2018 CLINICAL DATA:  Left subdural hematoma follow-up EXAM: CT HEAD WITHOUT CONTRAST TECHNIQUE: Contiguous axial images were obtained from the base of the skull through the vertex without intravenous contrast. COMPARISON:  Head CT 03/09/2018 FINDINGS: Brain: The left convexity subdural hematoma has increased in size with evidence ongoing acute hemorrhage since the prior study. The hematoma now covers the entire left convexity within approximate average thickness of 7 mm. There is rightward midline shift of 2 mm, unchanged. No intraparenchymal abnormality. Vascular: Atherosclerotic calcification of the internal carotid arteries at the skull base. No abnormal hyperdensity of the major intracranial arteries or dural venous sinuses. Skull: The visualized skull base, calvarium and extracranial soft tissues are normal. Sinuses/Orbits: No fluid levels or advanced mucosal thickening of the visualized paranasal sinuses. No mastoid or middle ear effusion. The orbits are normal. IMPRESSION: 1. Increased size of left convexity acute subdural hematoma, with continued acute bleeding since the prior scan. 2. Approximately 2 mm rightward midline shift. These results were called by telephone at the time of interpretation on 03/10/2018 at 8:29 pm to  [facility] , who verbally acknowledged these results. Electronically Signed   By: Ulyses Jarred M.D.   On: 03/10/2018 20:29    Scheduled Meds: . divalproex  250 mg Oral BID  . feeding supplement (ENSURE ENLIVE)  1 Bottle Oral BID BM  . ferrous sulfate  325 mg Oral BID WC  . finasteride  5 mg Oral Daily  . lactulose  20 g Oral Daily  . mirtazapine  7.5 mg Oral QHS  . olopatadine  1 drop Both Eyes BID  . rosuvastatin  10 mg Oral Daily  . senna-docusate  1 tablet Oral BID  . sodium chloride flush  3 mL Intravenous Q12H  . sotalol  80 mg Oral Q12H  . tamsulosin  0.4 mg Oral Daily   Continuous Infusions: . sodium chloride 250 mL (03/09/18 1730)    Principal Problem:   SDH (subdural hematoma) (HCC) Active Problems:   COPD (chronic obstructive pulmonary disease) (HCC)   S/P placement of cardiac pacemaker   Atrial fibrillation (HCC)   Long term current use of anticoagulant therapy   SSS (sick sinus syndrome) (HCC)   Benign essential HTN   Lt  Hip fracture requiring operative repair [facility])   Fall at nursing home   Fall at [facility], initial encounter    Time spent: >35 minutes     Kinnie Feil  [facility] Pager [phone]. If 7PM-7AM, please contact night-coverage at [email or web address], password [facility] 03/12/2018, 10:12 AM  LOS: 3 days

## 2018-03-12 NOTE — Progress Notes (Signed)
Pt being discharged to [place] via [place]. Pt alert and oriented x3/4. VSS. Pt c/o no pain at this time. No signs of respiratory distress. Education complete and care plans resolved. IV removed with catheter intact and pt tolerated well. No further issues at this time. Pt to follow up with PCP. Leanne Chang, RN

## 2018-03-12 NOTE — Progress Notes (Signed)
Physical Therapy Treatment Patient Details Name: Steve Norris MRN: [id number] DOB: 06-02-1929 Today's Date: 03/12/2018    History of Present Illness 84 y.o. male admitted on 03/09/18 for fall with resultant SDH 56mm midline shift, L femur fx s/p nailing WBAT post op.  Pt with significant PMH of falls, dementia, sinus node dysfunction, PAF, erosive esophagitis, Barrett's esophagus, COPD, CAD, HOH (R ear is better).      PT Comments    Pt was able to stand and pivot to chair with RW.  Pt continues to require two person mod assist.  He remains appropriate for SNF level rehab at discharge.  PT will continue to follow acutely for safe mobility progression  Follow Up Recommendations  SNF     Equipment Recommendations  Rolling walker with 5" wheels;3in1 (PT)    Recommendations for Other Services   NA     Precautions / Restrictions Precautions Precautions: Fall Precaution Comments: h/o falls, decreased awareness of deficits and situation.  Restrictions LLE Weight Bearing: Weight bearing as tolerated    Mobility  Bed Mobility Overal bed mobility: Needs Assistance Bed Mobility: Supine to Sit     Supine to sit: Mod assist;+2 for physical assistance;HOB elevated     General bed mobility comments: Two person mod assist to support trunk and help progress both legs together to EOB.  HOB 40 degrees.    Transfers Overall transfer level: Needs assistance Equipment used: Rolling walker (2 wheeled) Transfers: Sit to/from [facility] Sit to Stand: +2 physical assistance;Mod assist Stand pivot transfers: Mod assist;+2 physical assistance       General transfer comment: Two person physical assist from elelvated bed to stand to RW and take a few pivotal steps to the chair, he is too weak and painful to progress to short distance gait.  Assist needed to power up and for support at trunk when taking steps around.  Uncontrolled descent to sit down in the chair.          Balance Overall balance assessment: Needs assistance Sitting-balance support: Feet supported;Bilateral upper extremity supported Sitting balance-Leahy Scale: Fair Sitting balance - Comments: close supervision EOB   Standing balance support: Bilateral upper extremity supported Standing balance-Leahy Scale: Poor Standing balance comment: two person mod assist with RW in standing.                             Cognition Arousal/Alertness: Awake/alert Behavior During Therapy: WFL for tasks assessed/performed Overall Cognitive Status: History of cognitive impairments - at baseline                                 General Comments: h/o dementia, very pleasant      Exercises Total Joint Exercises Ankle Circles/Pumps: AAROM;Both;20 reps Heel Slides: AAROM;Left;10 reps Hip ABduction/ADduction: AAROM;Left;10 reps        Pertinent Vitals/Pain Pain Assessment: Faces Faces Pain Scale: Hurts whole lot Pain Location: left leg and left head Pain Descriptors / Indicators: Grimacing;Guarding Pain Intervention(s): Limited activity within patient's tolerance;Monitored during session;Repositioned           PT Goals (current goals can now be found in the care plan section) Acute Rehab PT Goals Patient Stated Goal: none stated Progress towards PT goals: Progressing toward goals    Frequency    Min 3X/week      PT Plan Current plan remains appropriate  AM-PAC PT "6 Clicks" Daily Activity  Outcome Measure  Difficulty turning over in bed (including adjusting bedclothes, sheets and blankets)?: Unable Difficulty moving from lying on back to sitting on the side of the bed? : Unable Difficulty sitting down on and standing up from a chair with arms (e.g., wheelchair, bedside commode, etc,.)?: Unable Help needed moving to and from a bed to chair (including a wheelchair)?: A Lot Help needed walking in hospital room?: A Lot Help needed climbing 3-5 steps with  a railing? : Total 6 Click Score: 8    End of Session Equipment Utilized During Treatment: Gait belt Activity Tolerance: Patient limited by pain Patient left: in chair;with call bell/phone within reach;with chair alarm set   PT Visit Diagnosis: Muscle weakness (generalized) (M62.81);Difficulty in walking, not elsewhere classified (R26.2);Pain Pain - Right/Left: Left Pain - part of body: Leg     Time: 1152-1220 PT Time Calculation (min) (ACUTE ONLY): 28 min  Charges:  $Therapeutic Exercise: 8-22 mins $Therapeutic Activity: 8-22 mins                    Izzabelle Bouley B. Ugochi Henzler, PT, DPT  Acute Rehabilitation [phone] pager #[phone] office   03/12/2018, 12:26 PM

## 2018-03-12 NOTE — Clinical Social Work Placement (Signed)
   CLINICAL SOCIAL WORK PLACEMENT  NOTE [facility]  Date:  03/12/2018  Patient Details  Name: Steve Norris MRN: [id number] Date of Birth: 12/01/1929  Clinical Social Work is seeking post-discharge placement for this patient at the [place] level of care (*CSW will initial, date and re-position this form in  chart as items are completed):  Yes   Patient/family provided with [facility] Work Department's list of facilities offering this level of care within the geographic area requested by the patient (or if unable, by the patient's family).  Yes   Patient/family informed of their freedom to choose among providers that offer the needed level of care, that participate in Medicare, Medicaid or managed care program needed by the patient, have an available bed and are willing to accept the patient.  Yes   Patient/family informed of [facility]'s ownership interest in [facility] and [facility], as well as of the fact that they are under no obligation to receive care at these facilities.  PASRR submitted to EDS on       PASRR number received on       Existing PASRR number confirmed on 03/10/18     FL2 transmitted to all facilities in geographic area requested by pt/family on 03/10/18     FL2 transmitted to all facilities within larger geographic area on       Patient informed that his/her managed care company has contracts with or will negotiate with certain facilities, including the following:        Yes   Patient/family informed of bed offers received.  Patient chooses bed at [place] [place]     Physician recommends and patient chooses bed at      Patient to be transferred to [facility] on 03/12/18.  Patient to be transferred to facility by [facility]     Patient family notified on 03/12/18 of transfer.  Name of family member notified:  [name]     PHYSICIAN       Additional Comment:     _______________________________________________ Alexander Mt, [place] 03/12/2018, 2:13 PM

## 2018-03-12 NOTE — Progress Notes (Signed)
Subjective: The patient is alert and pleasant.  His son is at the bedside.  He has not mobilized yet he is only been up to the chair.  Objective: Vital signs in last 24 hours: Temp:  [97.5 F (36.4 C)-98.4 F (36.9 C)] 97.8 F (36.6 C) (11/15 0400) Pulse Rate:  [82] 82 (11/14 1646) Resp:  [14] 14 (11/14 1646) BP: (97)/(54) 97/54 (11/14 1646) SpO2:  [92 %] 92 % (11/14 1646) Estimated body mass index is 21.52 kg/m as calculated from the following:   Height as of this encounter: 5\' 10"  (1.778 m).   Weight as of this encounter: 68 kg.   Intake/Output from previous day: 11/14 0701 - 11/15 0700 In: 560 [P.O.:560] Out: 1000 [Urine:1000] Intake/Output this shift: No intake/output data recorded.  Physical exam the patient is alert and oriented.  His speech and strength is normal.  Lab Results: Recent Labs    03/10/18 0250 03/12/18 0414  WBC 4.4 9.7  HGB 8.1* 8.2*  HCT 25.9* 26.4*  PLT 172 198   BMET Recent Labs    03/10/18 0250  NA 133*  K 4.7  CL 104  CO2 26  GLUCOSE 150*  BUN 18  CREATININE 0.66  CALCIUM 7.9*    Studies/Results: Ct Head Wo Contrast  Result Date: 03/10/2018 CLINICAL DATA:  Left subdural hematoma follow-up EXAM: CT HEAD WITHOUT CONTRAST TECHNIQUE: Contiguous axial images were obtained from the base of the skull through the vertex without intravenous contrast. COMPARISON:  Head CT 03/09/2018 FINDINGS: Brain: The left convexity subdural hematoma has increased in size with evidence ongoing acute hemorrhage since the prior study. The hematoma now covers the entire left convexity within approximate average thickness of 7 mm. There is rightward midline shift of 2 mm, unchanged. No intraparenchymal abnormality. Vascular: Atherosclerotic calcification of the internal carotid arteries at the skull base. No abnormal hyperdensity of the major intracranial arteries or dural venous sinuses. Skull: The visualized skull base, calvarium and extracranial soft tissues  are normal. Sinuses/Orbits: No fluid levels or advanced mucosal thickening of the visualized paranasal sinuses. No mastoid or middle ear effusion. The orbits are normal. IMPRESSION: 1. Increased size of left convexity acute subdural hematoma, with continued acute bleeding since the prior scan. 2. Approximately 2 mm rightward midline shift. These results were called by telephone at the time of interpretation on 03/10/2018 at 8:29 pm to [facility] , who verbally acknowledged these results. Electronically Signed   By: Ulyses Jarred M.D.   On: 03/10/2018 20:29    Assessment/Plan: Left subdural hematoma: The patient is doing well clinically, neurologically.  From my point of view he can be discharged and follow-up with me in the office next week for a follow-up head CT.  LOS: 3 days     Ophelia Charter 03/12/2018, 7:53 AM

## 2018-03-12 NOTE — Social Work (Signed)
Pt has insurance approval for SNF, [id number]. He is able to discharge to [facility] should attending MD consider him medically appropriate. Have paged attending MD. He will need discharge summary, and scripts printed for any scripts with controlled substances.   Westley Hummer, MSW, [place] Work [phone]

## 2018-03-16 ENCOUNTER — Other Ambulatory Visit (HOSPITAL_COMMUNITY): Payer: Self-pay | Admitting: Internal Medicine

## 2018-03-16 DIAGNOSIS — S065X9A Traumatic subdural hemorrhage with loss of consciousness of unspecified duration, initial encounter: Secondary | ICD-10-CM

## 2018-03-16 DIAGNOSIS — S065XAA Traumatic subdural hemorrhage with loss of consciousness status unknown, initial encounter: Secondary | ICD-10-CM

## 2018-03-18 ENCOUNTER — Ambulatory Visit (HOSPITAL_COMMUNITY)
Admission: RE | Admit: 2018-03-18 | Discharge: 2018-03-18 | Disposition: A | Discharge status: Home / Self Care | Payer: Medicare Other | Source: Ambulatory Visit | Attending: Internal Medicine | Admitting: Internal Medicine

## 2018-03-18 DIAGNOSIS — S065X9A Traumatic subdural hemorrhage with loss of consciousness of unspecified duration, initial encounter: Secondary | ICD-10-CM | POA: Diagnosis present

## 2018-03-18 DIAGNOSIS — S065XAA Traumatic subdural hemorrhage with loss of consciousness status unknown, initial encounter: Secondary | ICD-10-CM

## 2018-03-19 ENCOUNTER — Emergency Department (HOSPITAL_COMMUNITY)
Admission: EM | Admit: 2018-03-19 | Discharge: 2018-03-19 | Disposition: A | Discharge status: Home / Self Care | Payer: Medicare Other | Attending: Emergency Medicine | Admitting: Emergency Medicine

## 2018-03-19 DIAGNOSIS — S065X9A Traumatic subdural hemorrhage with loss of consciousness of unspecified duration, initial encounter: Secondary | ICD-10-CM

## 2018-03-19 DIAGNOSIS — Y929 Unspecified place or not applicable: Secondary | ICD-10-CM | POA: Insufficient documentation

## 2018-03-19 DIAGNOSIS — J449 Chronic obstructive pulmonary disease, unspecified: Secondary | ICD-10-CM | POA: Diagnosis not present

## 2018-03-19 DIAGNOSIS — S065X0A Traumatic subdural hemorrhage without loss of consciousness, initial encounter: Secondary | ICD-10-CM | POA: Diagnosis not present

## 2018-03-19 DIAGNOSIS — F0390 Unspecified dementia without behavioral disturbance: Secondary | ICD-10-CM | POA: Insufficient documentation

## 2018-03-19 DIAGNOSIS — I10 Essential (primary) hypertension: Secondary | ICD-10-CM | POA: Insufficient documentation

## 2018-03-19 DIAGNOSIS — Y939 Activity, unspecified: Secondary | ICD-10-CM | POA: Diagnosis not present

## 2018-03-19 DIAGNOSIS — Y999 Unspecified external cause status: Secondary | ICD-10-CM | POA: Diagnosis not present

## 2018-03-19 DIAGNOSIS — S065XAA Traumatic subdural hemorrhage with loss of consciousness status unknown, initial encounter: Secondary | ICD-10-CM

## 2018-03-19 DIAGNOSIS — W19XXXA Unspecified fall, initial encounter: Secondary | ICD-10-CM | POA: Diagnosis not present

## 2018-03-19 MED ORDER — ACETAMINOPHEN 325 MG PO TABS
650.0000 mg | ORAL_TABLET | Freq: Once | ORAL | Status: DC
  Filled 2018-03-19: qty 2

## 2018-03-19 NOTE — ED Provider Notes (Signed)
[facility] EMERGENCY DEPARTMENT Provider Note   CSN: [id number] Arrival date & time: 03/19/18  1234 Level 5 caveat dementia.  I obtained history from records accompanying patient and fromChristina Modena Slater, nurse at [facility] skilled nursing facility where patient resides, via phone    History   Chief Complaint Chief Complaint  Patient presents with  . Fall   Patient has had diminished level of consciousness, been more sleepy for the past several days.  His blood pressures have been in the 90s over approximate the past week.  This morning his blood pressure was 96/56.  He has had no fever.  Patient had a head CT scan as routine follow-up yesterday as an outpatient which showed increased size of subdural hematoma with increased mass-effect and 10 mL of rightward midline shift.  He was sent here for further evaluation HPI Steve Norris is a 84 y.o. male.  HPI  Past Medical History:  Diagnosis Date  . Arthritis   . Asthma   . Barrett's esophagus 03-19-2009   EGD  . CAD (coronary artery disease)    a. s/p stent placement in 2006 to the LAD and RCA  . COPD (chronic obstructive pulmonary disease) ([place])   . Dementia ([place])   . Diverticulosis 03-19-2009   Colonoscopy   . Erosive esophagitis 03-19-2009   EGD  . GERD (gastroesophageal reflux disease)   . Hiatal hernia 03-19-2009   EGD  . Hyperlipidemia   . Hypertension   . Internal hemorrhoids 03-19-2009   Colonoscopy  . Paroxysmal atrial fibrillation (HCC)   . Sinus node dysfunction (HCC)    a. s/p PPM placement in 2011  . Subdural hematoma ([place]) 05/08/2017   small, left/notes 05/08/2017    Patient Active Problem List   Diagnosis Date Noted  . SDH (subdural hematoma) ([place]) 03/09/2018  . Lt Hip fracture requiring operative repair ([place]) 03/09/2018  . Fall at nursing home 03/09/2018  . Fall at [facility], initial encounter 03/09/2018  . Iron deficiency anemia 01/25/2018  . Palliative care encounter 08/24/2017  .  Subdural hematoma ([place]) 05/08/2017  . New onset seizure ([place]) 12/23/2016  . Acute left otitis media 12/23/2016  . Acute encephalopathy 12/23/2016  . PAF (paroxysmal atrial fibrillation) ([place]) 12/23/2016  . Benign essential HTN 12/23/2016  . Chronic diastolic heart failure, NYHA class 2 ([place]) 12/23/2016  . Hyperlipidemia 12/23/2016  . Dementia ([place]) 12/23/2016  . Asthma 12/23/2016  . Elevated AST (SGOT) 12/23/2016  . Acute diastolic CHF (congestive heart failure) ([place])   . Pressure injury of skin 08/09/2016  . BPH (benign prostatic hyperplasia) 08/09/2016  . HTN (hypertension) 08/09/2016  . CHF exacerbation ([place]) 08/08/2016  . Anemia 03/25/2016  . SSS (sick sinus syndrome) ([place]) 03/25/2016  . CAP (community acquired pneumonia)   . Atrial fibrillation with rapid ventricular response ([place]) 11/04/2014  . Dyspnea 12/15/2012  . Long term current use of anticoagulant therapy 07/14/2012  . COPD (chronic obstructive pulmonary disease) ([place]) 02/23/2012  . CAD (coronary artery disease) 02/23/2012  . S/P placement of cardiac pacemaker 02/23/2012  . Atrial fibrillation ([place]) 02/23/2012  . Barrett's esophagus 04/30/2009    Past Surgical History:  Procedure Laterality Date  . CARDIAC CATHETERIZATION  02/12/2009   mild ostial left main disease,patent LAD & RCA stents   . CARDIOVERSION N/A 11/06/2014   Procedure: CARDIOVERSION;  Surgeon: [name], MD;  Location: [facility] ENDOSCOPY;  Service: Cardiovascular;  Laterality: N/A;  . CARDIOVERSION N/A 08/02/2015   Procedure: CARDIOVERSION;  Surgeon: Sueanne Margarita,  MD;  Location: [place];  Service: Cardiovascular;  Laterality: N/A;  . CATARACT EXTRACTION    . COLONOSCOPY    . CORONARY ANGIOPLASTY WITH STENT PLACEMENT  2006   Right Coronary Artery Cypher stents placed 2006  . HEMORRHOID SURGERY    . HIP PINNING,CANNULATED N/A 03/09/2018   Procedure: CANNULATED HIP PINNING;  Surgeon: Renette Butters, MD;  Location: [place];  Service:  Orthopedics;  Laterality: N/A;  . INSERT / REPLACE / REMOVE PACEMAKER  07/25/2009   [employer] Accent  . LEFT HEART CATHETERIZATION WITH CORONARY ANGIOGRAM N/A 07/01/2013   Procedure: LEFT HEART CATHETERIZATION WITH CORONARY ANGIOGRAM;  Surgeon: Sanda Klein, MD;  Location: [place] CATH LAB;  Service: Cardiovascular;  Laterality: N/A;  . NM MYOCAR PERF WALL MOTION  08/14/2008   no significant ischemia EF 64%  . TEE WITHOUT CARDIOVERSION N/A 08/02/2015   Procedure: TRANSESOPHAGEAL ECHOCARDIOGRAM (TEE);  Surgeon: Sueanne Margarita, MD;  Location: [facility] ENDOSCOPY;  Service: Cardiovascular;  Laterality: N/A;  . TONSILLECTOMY    . US ECHOCARDIOGRAPHY  07/12/2009   borderline LA enlargement,mild mitral annular ca+, AOV mildly sclerotic,trace AI.  Marland Kitchen VASECTOMY          Home Medications    Prior to Admission medications   Medication Sig Start Date End Date Taking? Authorizing Provider  acetaminophen (TYLENOL) 500 MG tablet Take 500 mg by mouth every 6 (six) hours as needed for mild pain or fever.     [provider]  albuterol (ACCUNEB) 0.63 MG/3ML nebulizer solution Take 1 ampule by nebulization every 8 (eight) hours as needed for wheezing.    [provider]  Cholecalciferol (VITAMIN D3) 2000 units capsule Take 4,000 Units by mouth daily.    [provider]  divalproex (DEPAKOTE) 250 MG DR tablet TAKE (1) TABLET BY MOUTH TWICE DAILY. Patient taking differently: Take 250 mg by mouth 2 (two) times daily.  02/23/17   Garvin Fila, MD  feeding supplement, ENSURE ENLIVE, (ENSURE ENLIVE) LIQD Take 1 Bottle by mouth 2 (two) times daily between meals.    [provider]  ferrous sulfate (FEROSUL) 325 (65 FE) MG tablet Take 325 mg by mouth 2 (two) times daily with a meal.    [provider]  finasteride (PROSCAR) 5 MG tablet Take 5 mg by mouth daily.  02/17/12   [provider]  Fluticasone-Salmeterol (ADVAIR) 250-50 MCG/DOSE AEPB Inhale 1 puff into the lungs 2  (two) times daily.    [provider]  HYDROcodone-acetaminophen (NORCO) 5-325 MG tablet Take 1 tablet by mouth every 6 (six) hours as needed for moderate pain. 03/09/18   Prudencio Burly III, PA-C  lactulose (CHRONULAC) 10 GM/15ML solution Take 20 g by mouth daily.     [provider]  mineral oil liquid Place 0.05 mLs in ear(s) once a week. On Thursday    [provider]  mirtazapine (REMERON) 7.5 MG tablet Take 7.5 mg by mouth at bedtime.    [provider]  nitroGLYCERIN (NITROSTAT) 0.4 MG SL tablet Place 0.4 mg under the tongue every 5 (five) minutes as needed for chest pain.     [provider]  Olopatadine HCl (PATADAY) 0.2 % SOLN Place 1 drop into both eyes daily.     [provider]  rosuvastatin (CRESTOR) 10 MG tablet Take 10 mg by mouth daily.    [provider]  senna-docusate (SENOKOT-S) 8.6-50 MG tablet Take 1 tablet by mouth 2 (two) times daily. 03/12/18   Rowe Clack  N, MD  Skin Protectants, Misc. (MINERIN) CREA Apply 1 application topically at bedtime. To back    [provider]  sotalol (BETAPACE) 80 MG tablet take 1 tablet by mouth every 12 hours Patient taking differently: 80 mg oral twice daily 02/11/16   Croitoru, Mihai, MD  tamsulosin (FLOMAX) 0.4 MG CAPS capsule Take 1 capsule by mouth daily. 12/27/15   [provider]  Whey Protein POWD Take 1 Scoop by mouth 2 (two) times daily.    [provider]    Family History Family History  Problem Relation Age of Onset  . Heart disease Mother   . Heart disease Father   . Colon cancer Neg Hx     Social History Social History   Tobacco Use  . Smoking status: Former Research scientist (life sciences)  . Smokeless tobacco: Never Used  . Tobacco comment: Quit smoking 1996  Substance Use Topics  . Alcohol use: No  . Drug use: No  In skilled nursing facility.  DNR CODE STATUS   Allergies   Amiodarone; Bactrim [sulfamethoxazole-trimethoprim]; and  Penicillins   Review of Systems Review of Systems  Unable to perform ROS: Dementia     Physical Exam Updated Vital Signs BP (!) 97/56   Pulse 88   Temp 98 F (36.7 C) (Oral)   Resp 17   Ht 5\' 10"  (1.778 m)   Wt 68 kg   SpO2 97%   BMI 21.52 kg/m   Physical Exam  HENT:  Head: Normocephalic and atraumatic.  Sutured well-healing laceration over left eyebrow otherwise atraumatic  Eyes: Pupils are equal, round, and reactive to light. Conjunctivae are normal.  Neck: Neck supple. No tracheal deviation present. No thyromegaly present.  Cardiovascular: Normal rate and regular rhythm.  No murmur heard. Pulmonary/Chest: Effort normal and breath sounds normal.  Abdominal: Soft. Bowel sounds are normal. He exhibits no distension. There is no tenderness.  Musculoskeletal: Normal range of motion. He exhibits no edema or tenderness.  Neurological: Coordination normal.  Sleepy arousable to verbal stimulus.  Follows simple commands.  Cranial nerves II through XII grossly intact.  Skin: Skin is warm and dry. No rash noted.  Psychiatric:  Unobtainable  Nursing note and vitals reviewed.    ED Treatments / Results  Labs (all labs ordered are listed, but only abnormal results are displayed) Labs Reviewed - No data to display  EKG None  Radiology Ct Head Wo Contrast  Result Date: 03/19/2018 CLINICAL DATA:  Follow-up subdural hematoma. EXAM: CT HEAD WITHOUT CONTRAST TECHNIQUE: Contiguous axial images were obtained from the base of the skull through the vertex without intravenous contrast. COMPARISON:  03/10/2018 FINDINGS: Brain: Subdural hematoma over the left cerebral convexity has enlarged and now measures up to 12 mm in thickness (previously 8 mm). Much of the collection is now hypodense, however hyperdense blood products remain anteriorly in the frontal region as well as posterolaterally in the parietal region. Mass effect on the left cerebral hemisphere has increased with 10 mm of  rightward midline shift (previously 4 mm). There is no evidence of acute infarct. There is no ventriculomegaly. Cerebral white matter hypodensities are similar to the prior study and nonspecific but compatible with mild chronic small vessel ischemic disease. Vascular: Calcified atherosclerosis at the skull base. No hyperdense vessel. Skull: No fracture or focal osseous lesion. Sinuses/Orbits: Persistent large bilateral mastoid and middle ear effusions. Visualized paranasal sinuses are clear. Bilateral cataract extraction is noted. Other: None. IMPRESSION: Increased size of left cerebral convexity subdural hematoma with increased mass  effect and 10 mm of rightward midline shift. These results will be called to the ordering clinician or representative by the Radiologist Assistant, and communication documented in the PACS or zVision Dashboard. Electronically Signed   By: Logan Bores M.D.   On: 03/19/2018 08:38    Procedures Procedures (including critical care time)  Medications Ordered in ED Medications - No data to display   Initial Impression / Assessment and Plan / ED Course  I have reviewed the triage vital signs and the nursing notes.  Pertinent labs & imaging results that were available during my care of the patient were reviewed by me and considered in my medical decision making (see chart for details).     I had a lengthy discussion via telephone with patient's son and healthcare power of attorney [place], [place] son reports that he has had conversation with patient's other son Duane Earnshaw and both are in agreement they do not want surgery for their father and want comfort measures only.  Mr.Nepomuceno Brooke Bonito is okay with patient being transported back to skilled nursing facility.  I suggested that he call to get hospice involved.  He was grateful for that suggestion.  He will be transported back to [facility]  Final Clinical Impressions(s) / ED Diagnoses  Diagnosis #1subdural hematoma #2  hypotension Final diagnoses:  None    ED Discharge Orders    None       Orlie Dakin, MD 03/19/18 1511

## 2018-03-19 NOTE — Consult Note (Signed)
Reason for Consult: Increase in size of existing SDH Referring Physician: Dr. Raliegh Ip is an 84 y.o. male.  HPI: Steve Norris presented to the emergency department today after a follow up CT revealed an increase in size of the left-sided subdural hematoma he sustained on 03/09/2018. He was admitted for observation at that time as he was on Eliquis for atrial fibrillation. His anticoagulation was reversed in the ED on 03/09/18 and he has not received this medication since he was discharge back to his nursing facility.  Per report, he did not have a change in his neurological status. He has a history significant for a. fib, dementia, COPD, chronic diastolic heart failure and CAD. He is a DNR.  Past Medical History:  Diagnosis Date  . Arthritis   . Asthma   . Barrett's esophagus 03-19-2009   EGD  . CAD (coronary artery disease)    a. s/p stent placement in 2006 to the LAD and RCA  . COPD (chronic obstructive pulmonary disease) ([place])   . Dementia ([place])   . Diverticulosis 03-19-2009   Colonoscopy   . Erosive esophagitis 03-19-2009   EGD  . GERD (gastroesophageal reflux disease)   . Hiatal hernia 03-19-2009   EGD  . Hyperlipidemia   . Hypertension   . Internal hemorrhoids 03-19-2009   Colonoscopy  . Paroxysmal atrial fibrillation (HCC)   . Sinus node dysfunction (HCC)    a. s/p PPM placement in 2011  . Subdural hematoma ([place]) 05/08/2017   small, left/notes 05/08/2017    Past Surgical History:  Procedure Laterality Date  . CARDIAC CATHETERIZATION  02/12/2009   mild ostial left main disease,patent LAD & RCA stents   . CARDIOVERSION N/A 11/06/2014   Procedure: CARDIOVERSION;  Surgeon: [name], MD;  [facility]COPY;  Service: Cardiovascular;  Laterality: N/A;  . CARDIOVERSION N/A 08/02/2015   Procedure: CARDIOVERSION;  Surgeon: Sueanne Margarita, MD;  Location: [facility] ENDOSCOPY;  Service: Cardiovascular;  Laterality: N/A;  . CATARACT EXTRACTION    . COLONOSCOPY     . CORONARY ANGIOPLASTY WITH STENT PLACEMENT  2006   Right Coronary Artery Cypher stents placed 2006  . HEMORRHOID SURGERY    . HIP PINNING,CANNULATED N/A 03/09/2018   Procedure: CANNULATED HIP PINNING;  Surgeon: Renette Butters, MD;  Location: [place];  Service: Orthopedics;  Laterality: N/A;  . INSERT / REPLACE / REMOVE PACEMAKER  07/25/2009   [employer] Accent  . LEFT HEART CATHETERIZATION WITH CORONARY ANGIOGRAM N/A 07/01/2013   Procedure: LEFT HEART CATHETERIZATION WITH CORONARY ANGIOGRAM;  Surgeon: Sanda Klein, MD;  Location: [place] CATH LAB;  Service: Cardiovascular;  Laterality: N/A;  . NM MYOCAR PERF WALL MOTION  08/14/2008   no significant ischemia EF 64%  . TEE WITHOUT CARDIOVERSION N/A 08/02/2015   Procedure: TRANSESOPHAGEAL ECHOCARDIOGRAM (TEE);  Surgeon: Sueanne Margarita, MD;  Location: [facility] ENDOSCOPY;  Service: Cardiovascular;  Laterality: N/A;  . TONSILLECTOMY    . US ECHOCARDIOGRAPHY  07/12/2009   borderline LA enlargement,mild mitral annular ca+, AOV mildly sclerotic,trace AI.  Marland Kitchen VASECTOMY      Family History  Problem Relation Age of Onset  . Heart disease Mother   . Heart disease Father   . Colon cancer Neg Hx     Social History:  reports that he has quit smoking. He has never used smokeless tobacco. He reports that he does not drink alcohol or use drugs.  Allergies:  Allergies  Allergen Reactions  . Amiodarone Nausea And Vomiting  .  Bactrim [Sulfamethoxazole-Trimethoprim] Other (See Comments)    Unknown reaction. Listed on MAR  . Penicillins Hives, Swelling and Rash    Medications: I have reviewed the patient's current medications.  No results found for this or any previous visit (from the past 48 hour(s)).  Ct Head Wo Contrast  Result Date: 03/19/2018 CLINICAL DATA:  Follow-up subdural hematoma. EXAM: CT HEAD WITHOUT CONTRAST TECHNIQUE: Contiguous axial images were obtained from the base of the skull through the vertex without intravenous contrast. COMPARISON:   03/10/2018 FINDINGS: Brain: Subdural hematoma over the left cerebral convexity has enlarged and now measures up to 12 mm in thickness (previously 8 mm). Much of the collection is now hypodense, however hyperdense blood products remain anteriorly in the frontal region as well as posterolaterally in the parietal region. Mass effect on the left cerebral hemisphere has increased with 10 mm of rightward midline shift (previously 4 mm). There is no evidence of acute infarct. There is no ventriculomegaly. Cerebral white matter hypodensities are similar to the prior study and nonspecific but compatible with mild chronic small vessel ischemic disease. Vascular: Calcified atherosclerosis at the skull base. No hyperdense vessel. Skull: No fracture or focal osseous lesion. Sinuses/Orbits: Persistent large bilateral mastoid and middle ear effusions. Visualized paranasal sinuses are clear. Bilateral cataract extraction is noted. Other: None. IMPRESSION: Increased size of left cerebral convexity subdural hematoma with increased mass effect and 10 mm of rightward midline shift. These results will be called to the ordering clinician or representative by the Radiologist Assistant, and communication documented in the PACS or zVision Dashboard. Electronically Signed   By: Logan Bores M.D.   On: 03/19/2018 08:38    Review of Systems  Constitutional: Negative.   HENT: Positive for hearing loss.   Musculoskeletal: Negative for back pain, falls, joint pain, myalgias and neck pain.  Neurological: Negative for dizziness, tingling, sensory change, focal weakness and headaches.   Blood pressure (!) 83/55, pulse 88, temperature 98 F (36.7 C), temperature source Oral, resp. rate 14, height 5\' 10"  (1.778 m), weight 68 kg, SpO2 97 %. Physical Exam  Constitutional: He appears cachectic. He is sleeping. He is easily aroused.  Eyes: Pupils are equal, round, and reactive to light.  Bruising around left eye  Neck: Normal range of  motion. Neck supple.  Respiratory: Effort normal and breath sounds normal.  GI: Soft. Bowel sounds are normal.  Musculoskeletal: Normal range of motion.  Neurological: He is easily aroused.  He is oriented to person and place. Extremely HOH.  Skin: Skin is warm and dry.  Psychiatric: He has a normal mood and affect.    Assessment/Plan: Steve Norris son is his healthcare power of attorney. Per Dr. Lyn Hollingshead conversation with the family, Steve Norris would not wish to undergo surgery for evacuation of his SDH. Given his neurological exam is unchanged from his last encounter and his multiple comorbidities, surgery would not be recommended at this time. I agree with Dr. Lyn Hollingshead recommendation for palliative care to become involved.  Patricia Nettle 03/19/2018, 2:09 PM

## 2018-03-19 NOTE — ED Triage Notes (Addendum)
Pt here via [facility] from [place] with a subdural hematoma diagnosed on 03/12/18. Pt fell one week ago and was seen here for same. Staff at facility performed a CT scan this morning that showed growth in the hematoma from 63mm to 28mm in thickness. Per EMS, facility nursing staff have held patient's Eloquis since 03/12/18. Pt does not have any complaints. Hx of dementia.   Patient is extremely hard of hearing- hears best in right ear.

## 2018-03-19 NOTE — Discharge Instructions (Addendum)
Please consult hospice to be involved with Steve Norris care.  Recommend 2 L of nasal cannula oxygen at the nursing facility.

## 2018-03-24 ENCOUNTER — Other Ambulatory Visit: Payer: Self-pay | Admitting: Neurosurgery

## 2018-03-24 DIAGNOSIS — S065XAA Traumatic subdural hemorrhage with loss of consciousness status unknown, initial encounter: Secondary | ICD-10-CM

## 2018-03-24 DIAGNOSIS — S065X9A Traumatic subdural hemorrhage with loss of consciousness of unspecified duration, initial encounter: Secondary | ICD-10-CM

## 2018-03-31 ENCOUNTER — Encounter: Payer: Self-pay | Admitting: Hematology

## 2018-04-08 ENCOUNTER — Ambulatory Visit: Payer: Medicare Other | Admitting: Cardiovascular Disease

## 2018-04-19 ENCOUNTER — Ambulatory Visit: Payer: Medicare Other | Admitting: Hematology

## 2018-04-19 ENCOUNTER — Other Ambulatory Visit: Payer: Medicare Other

## 2018-04-26 ENCOUNTER — Ambulatory Visit: Payer: Medicare Other | Admitting: Hematology

## 2018-04-26 ENCOUNTER — Other Ambulatory Visit: Payer: Medicare Other

## 2018-04-28 DEATH — deceased

## 2018-04-30 ENCOUNTER — Ambulatory Visit: Payer: Medicare Other | Admitting: Cardiovascular Disease

## 2018-05-12 ENCOUNTER — Encounter: Payer: Self-pay | Admitting: Hematology

## 2018-05-27 LAB — CUP PACEART INCLINIC DEVICE CHECK
Implantable Lead Implant Date: 20110330
Implantable Lead Implant Date: 20110330
Implantable Lead Location: 753860
Implantable Pulse Generator Implant Date: 20110330
MDC IDC LEAD LOCATION: 753859
MDC IDC PG SERIAL: 7124006
MDC IDC SESS DTM: 20200130144004
Pulse Gen Model: 2210

## 2018-07-28 IMAGING — CT CT MAXILLOFACIAL W/O CM
5 of 14 series · 14 of 47 positions shown, 15 images · non-contrast
Comparison: 12/23/2016

CLINICAL DATA: Unwitnessed fall with laceration to the top of head

EXAM:
CT HEAD WITHOUT CONTRAST
CT MAXILLOFACIAL WITHOUT CONTRAST
CT CERVICAL SPINE WITHOUT CONTRAST
TECHNIQUE: Multidetector CT imaging of the head, cervical spine, and
maxillofacial structures were performed using the standard protocol
without intravenous contrast. Multiplanar CT image reconstructions
of the cervical spine and maxillofacial structures were also
generated.

[Series 5: head bone · axial · 0.48mm/px · z∈[-82,+2]mm · 3 of 84 slices shown]
[im 21/84  bone]
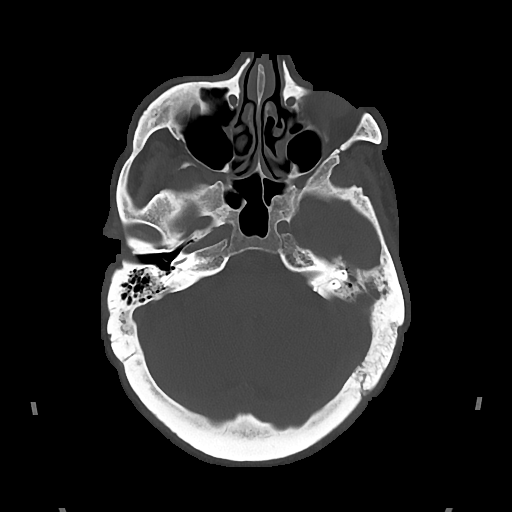
[im 42/84  bone]
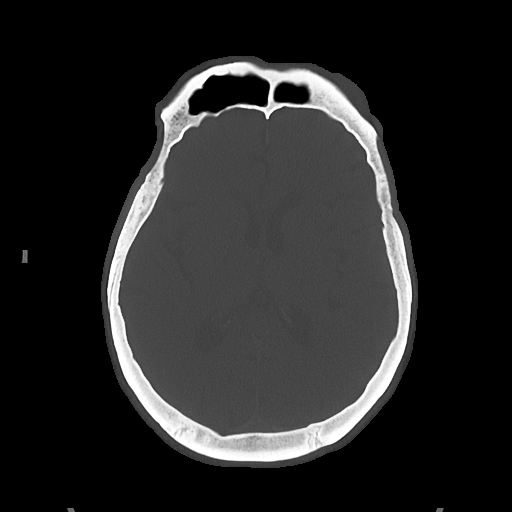
[im 63/84  bone]
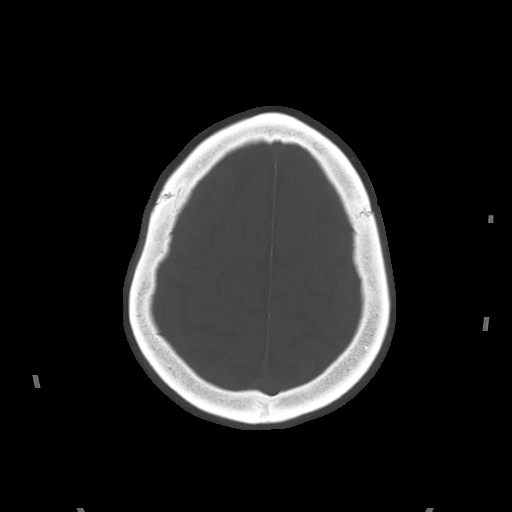

[Series 8: maxilllofacial 2.0 hr40 3 · axial · 0.33mm/px · z∈[-148,-60]mm · 3 of 89 slices shown]
[im 23/89  bone]
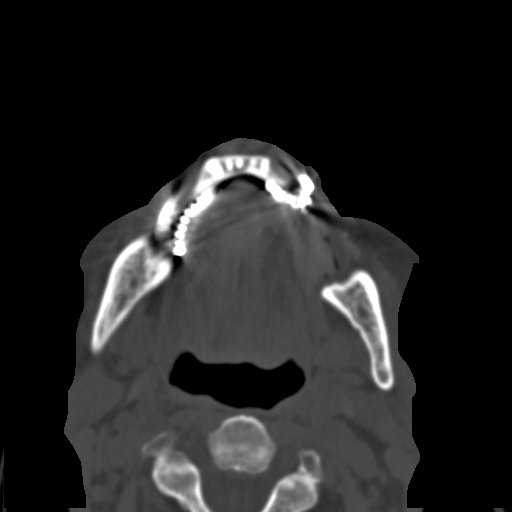
[im 45/89  bone]
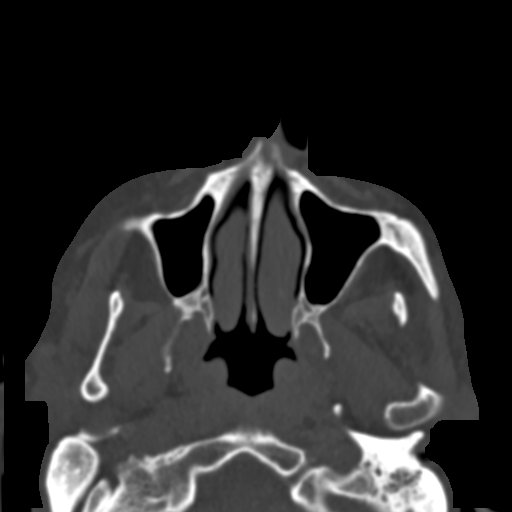
[im 67/89  bone]
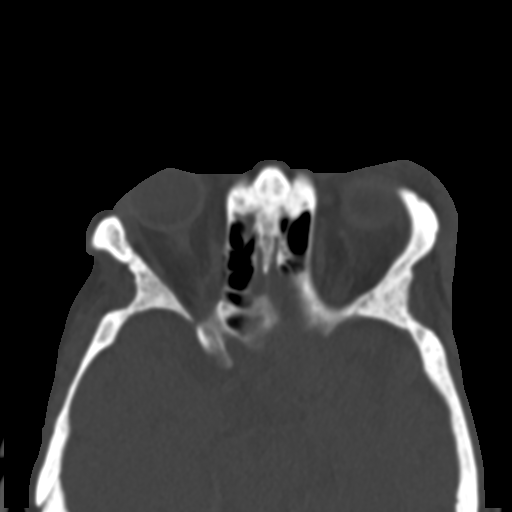

[Series 10: maxilllofacial 2.0 hr59 3 · axial · 0.33mm/px · z∈[-148,-60]mm · 3 of 89 slices shown]
[im 23/89  bone]
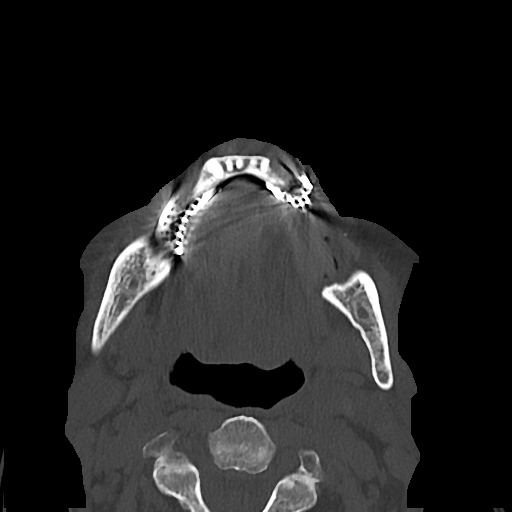
[im 45/89  bone]
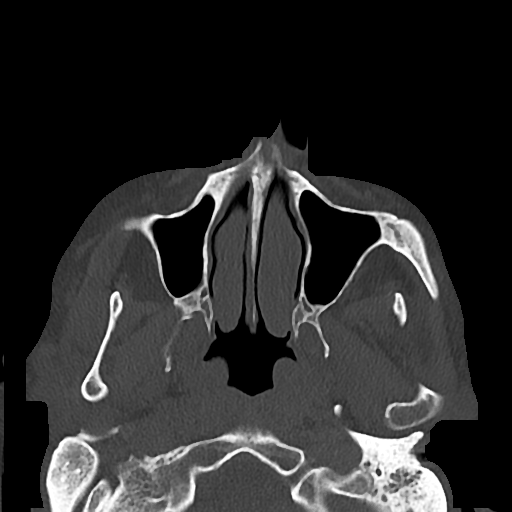
[im 67/89  bone]
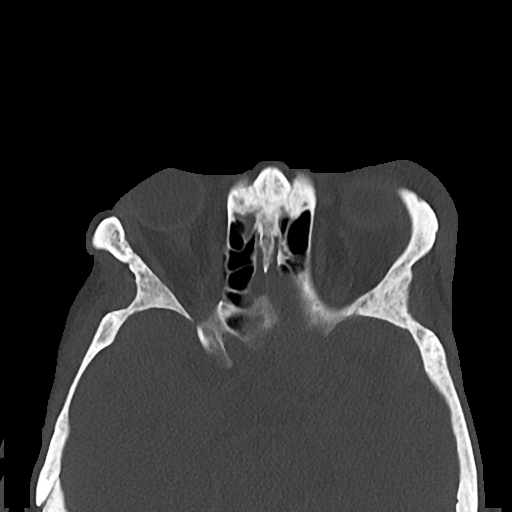

[Series 12: st cor · coronal · 0.34mm/px · 1 of 101 slices shown]
[im 51/101  bone]
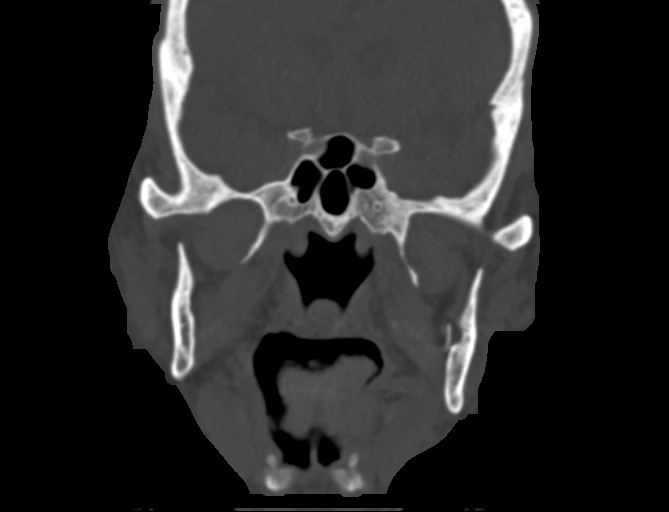

[Series 20: orthogonal axials · oblique · 0.21mm/px · 4 of 114 slices shown, 5 images]
[im 23/114  brain]
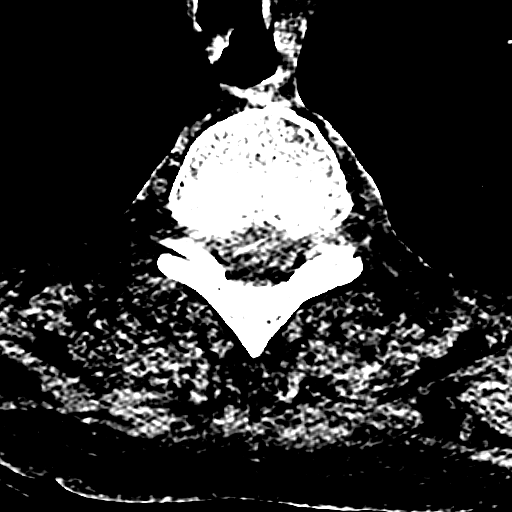
[im 23/114  bone]
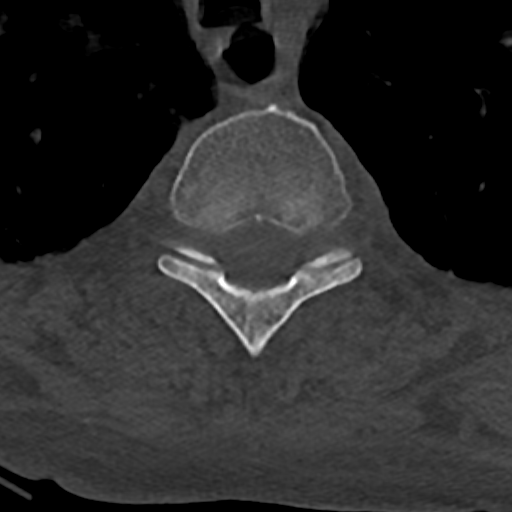
[im 46/114  bone]
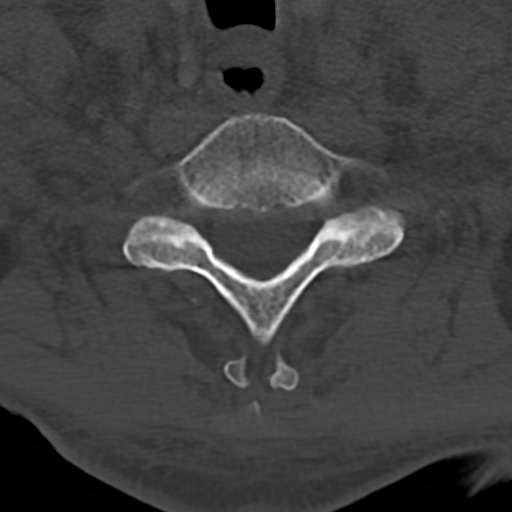
[im 68/114  bone]
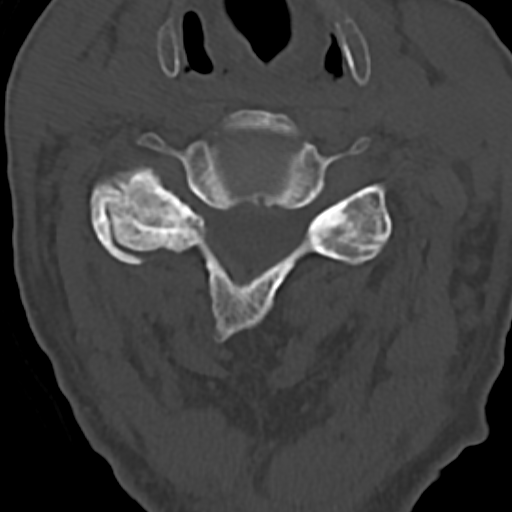
[im 91/114  bone]
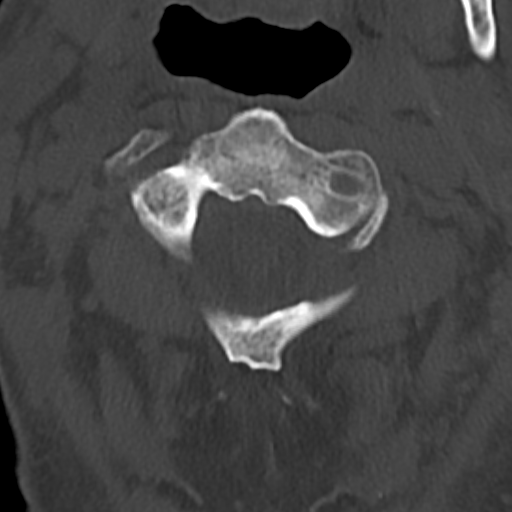

[14 of 47 positions shown; findings below may reference images not displayed]

FINDINGS: CT HEAD FINDINGS

Brain: No acute territorial infarction, hemorrhage, or intracranial
mass is seen. Atrophy. Stable ventricle size.

Vascular: No hyperdense vessels. Scattered calcifications at the
carotid siphons.

Skull: No skull fracture. Left mastoid effusion. Fluid in the left
middle air.

Other: Left periorbital and forehead hematoma

CT MAXILLOFACIAL FINDINGS

Osseous: Mandibular heads are normally position. No mandibular
fracture. Pterygoid plates and zygomatic arches appear intact. No
nasal bone fracture

Orbits: Negative. No traumatic or inflammatory finding.

Sinuses: Mild mucosal thickening in the right frontal and ethmoid
sinuses. No acute fluid level. No sinus wall fracture

Soft tissues: Large left periorbital and forehead soft tissue
hematoma

CT CERVICAL SPINE FINDINGS

Alignment: Straightening of the cervical spine. No subluxation is
seen. Facet alignment is maintained.

Skull base and vertebrae: No acute fracture. No primary bone lesion
or focal pathologic process.

Soft tissues and spinal canal: No prevertebral fluid or swelling. No
visible canal hematoma.

Disc levels: Moderate degenerative changes at C5-C6 and mild to
moderate changes at C6-C7. Multiple level bilateral foraminal
stenosis, secondary to spurring and facet hypertrophy.

Upper chest: Biapical emphysema. Negative for pneumothorax. Multiple
small neck lymph nodes.

Other: None
IMPRESSION: 1. No CT evidence for acute intracranial abnormality. Stable left
mastoid and middle ear fluid.
2. Large left periorbital and forehead hematoma. No acute facial
bone fracture.
3. Straightening of the cervical spine with degenerative changes. No
acute osseous abnormality
4. Biapical emphysema
